# Patient Record
Sex: Female | Born: 1935 | Race: White | Hispanic: No | Smoking: Former smoker
Health system: Southern US, Community
[De-identification: ages and names within clinical notes are randomized; demographics above are authoritative.]

## PROBLEM LIST (undated history)

## (undated) DIAGNOSIS — N189 Chronic kidney disease, unspecified: Secondary | ICD-10-CM

## (undated) DIAGNOSIS — S72009A Fracture of unspecified part of neck of unspecified femur, initial encounter for closed fracture: Secondary | ICD-10-CM

## (undated) DIAGNOSIS — I10 Essential (primary) hypertension: Secondary | ICD-10-CM

## (undated) DIAGNOSIS — F419 Anxiety disorder, unspecified: Secondary | ICD-10-CM

## (undated) DIAGNOSIS — K5790 Diverticulosis of intestine, part unspecified, without perforation or abscess without bleeding: Secondary | ICD-10-CM

## (undated) DIAGNOSIS — I6529 Occlusion and stenosis of unspecified carotid artery: Secondary | ICD-10-CM

## (undated) DIAGNOSIS — I2510 Atherosclerotic heart disease of native coronary artery without angina pectoris: Secondary | ICD-10-CM

## (undated) DIAGNOSIS — M810 Age-related osteoporosis without current pathological fracture: Secondary | ICD-10-CM

## (undated) DIAGNOSIS — E039 Hypothyroidism, unspecified: Secondary | ICD-10-CM

## (undated) DIAGNOSIS — R001 Bradycardia, unspecified: Secondary | ICD-10-CM

## (undated) DIAGNOSIS — E785 Hyperlipidemia, unspecified: Secondary | ICD-10-CM

## (undated) DIAGNOSIS — K4090 Unilateral inguinal hernia, without obstruction or gangrene, not specified as recurrent: Secondary | ICD-10-CM

## (undated) DIAGNOSIS — G459 Transient cerebral ischemic attack, unspecified: Secondary | ICD-10-CM

## (undated) DIAGNOSIS — I5032 Chronic diastolic (congestive) heart failure: Secondary | ICD-10-CM

## (undated) DIAGNOSIS — M8580 Other specified disorders of bone density and structure, unspecified site: Secondary | ICD-10-CM

## (undated) DIAGNOSIS — I350 Nonrheumatic aortic (valve) stenosis: Secondary | ICD-10-CM

## (undated) DIAGNOSIS — R7309 Other abnormal glucose: Secondary | ICD-10-CM

## (undated) DIAGNOSIS — E559 Vitamin D deficiency, unspecified: Secondary | ICD-10-CM

## (undated) DIAGNOSIS — K2970 Gastritis, unspecified, without bleeding: Secondary | ICD-10-CM

## (undated) HISTORY — PX: HIP SURGERY: SHX245

## (undated) HISTORY — PX: EYE SURGERY: SHX253

## (undated) HISTORY — DX: Chronic diastolic (congestive) heart failure: I50.32

## (undated) HISTORY — DX: Other specified disorders of bone density and structure, unspecified site: M85.80

## (undated) HISTORY — DX: Hyperlipidemia, unspecified: E78.5

## (undated) HISTORY — DX: Diverticulosis of intestine, part unspecified, without perforation or abscess without bleeding: K57.90

## (undated) HISTORY — DX: Unilateral inguinal hernia, without obstruction or gangrene, not specified as recurrent: K40.90

## (undated) HISTORY — DX: Atherosclerotic heart disease of native coronary artery without angina pectoris: I25.10

## (undated) HISTORY — PX: INGUINAL HERNIA REPAIR: SUR1180

## (undated) HISTORY — PX: TONSILLECTOMY: SUR1361

## (undated) HISTORY — DX: Hypothyroidism, unspecified: E03.9

## (undated) HISTORY — DX: Bradycardia, unspecified: R00.1

## (undated) HISTORY — DX: Nonrheumatic aortic (valve) stenosis: I35.0

## (undated) HISTORY — DX: Other abnormal glucose: R73.09

## (undated) HISTORY — DX: Occlusion and stenosis of unspecified carotid artery: I65.29

## (undated) HISTORY — DX: Chronic kidney disease, unspecified: N18.9

## (undated) HISTORY — PX: SKIN LESION EXCISION: SHX2412

## (undated) HISTORY — DX: Vitamin D deficiency, unspecified: E55.9

## (undated) HISTORY — DX: Transient cerebral ischemic attack, unspecified: G45.9

---

## 1981-05-24 HISTORY — PX: BLEPHAROPLASTY: SUR158

## 1989-05-24 HISTORY — PX: MELANOMA EXCISION: SHX5266

## 1997-07-03 ENCOUNTER — Ambulatory Visit (HOSPITAL_COMMUNITY): Admission: RE | Admit: 1997-07-03 | Discharge: 1997-07-03 | Payer: Self-pay | Admitting: Internal Medicine

## 1997-12-04 ENCOUNTER — Other Ambulatory Visit: Admission: RE | Admit: 1997-12-04 | Discharge: 1997-12-04 | Payer: Self-pay | Admitting: Internal Medicine

## 1998-09-25 ENCOUNTER — Encounter: Payer: Self-pay | Admitting: Internal Medicine

## 1998-09-25 ENCOUNTER — Ambulatory Visit (HOSPITAL_COMMUNITY): Admission: RE | Admit: 1998-09-25 | Discharge: 1998-09-25 | Payer: Self-pay | Admitting: Internal Medicine

## 1998-10-30 ENCOUNTER — Other Ambulatory Visit: Admission: RE | Admit: 1998-10-30 | Discharge: 1998-10-30 | Payer: Self-pay | Admitting: Internal Medicine

## 1999-11-26 ENCOUNTER — Encounter: Payer: Self-pay | Admitting: Internal Medicine

## 1999-11-26 ENCOUNTER — Ambulatory Visit (HOSPITAL_COMMUNITY): Admission: RE | Admit: 1999-11-26 | Discharge: 1999-11-26 | Payer: Self-pay | Admitting: Internal Medicine

## 2000-12-07 ENCOUNTER — Encounter: Payer: Self-pay | Admitting: Internal Medicine

## 2000-12-07 ENCOUNTER — Ambulatory Visit (HOSPITAL_COMMUNITY): Admission: RE | Admit: 2000-12-07 | Discharge: 2000-12-07 | Payer: Self-pay | Admitting: Internal Medicine

## 2001-12-13 ENCOUNTER — Ambulatory Visit (HOSPITAL_COMMUNITY): Admission: RE | Admit: 2001-12-13 | Discharge: 2001-12-13 | Payer: Self-pay | Admitting: Internal Medicine

## 2001-12-13 ENCOUNTER — Encounter: Payer: Self-pay | Admitting: Internal Medicine

## 2002-02-15 ENCOUNTER — Encounter: Payer: Self-pay | Admitting: Emergency Medicine

## 2002-02-15 ENCOUNTER — Emergency Department (HOSPITAL_COMMUNITY): Admission: EM | Admit: 2002-02-15 | Discharge: 2002-02-15 | Payer: Self-pay | Admitting: Emergency Medicine

## 2002-07-04 ENCOUNTER — Other Ambulatory Visit: Admission: RE | Admit: 2002-07-04 | Discharge: 2002-07-04 | Payer: Self-pay | Admitting: Gynecology

## 2002-12-17 ENCOUNTER — Encounter: Payer: Self-pay | Admitting: Internal Medicine

## 2002-12-17 ENCOUNTER — Ambulatory Visit (HOSPITAL_COMMUNITY): Admission: RE | Admit: 2002-12-17 | Discharge: 2002-12-17 | Payer: Self-pay | Admitting: Internal Medicine

## 2004-01-02 ENCOUNTER — Ambulatory Visit (HOSPITAL_COMMUNITY): Admission: RE | Admit: 2004-01-02 | Discharge: 2004-01-02 | Payer: Self-pay | Admitting: Internal Medicine

## 2004-01-30 ENCOUNTER — Other Ambulatory Visit: Admission: RE | Admit: 2004-01-30 | Discharge: 2004-01-30 | Payer: Self-pay | Admitting: Internal Medicine

## 2004-08-06 ENCOUNTER — Ambulatory Visit (HOSPITAL_COMMUNITY): Admission: RE | Admit: 2004-08-06 | Discharge: 2004-08-06 | Payer: Self-pay | Admitting: General Surgery

## 2004-08-06 ENCOUNTER — Ambulatory Visit (HOSPITAL_BASED_OUTPATIENT_CLINIC_OR_DEPARTMENT_OTHER): Admission: RE | Admit: 2004-08-06 | Discharge: 2004-08-06 | Payer: Self-pay | Admitting: General Surgery

## 2004-08-06 ENCOUNTER — Encounter (INDEPENDENT_AMBULATORY_CARE_PROVIDER_SITE_OTHER): Payer: Self-pay | Admitting: *Deleted

## 2005-01-04 ENCOUNTER — Ambulatory Visit (HOSPITAL_COMMUNITY): Admission: RE | Admit: 2005-01-04 | Discharge: 2005-01-04 | Payer: Self-pay | Admitting: Internal Medicine

## 2006-01-18 ENCOUNTER — Ambulatory Visit (HOSPITAL_COMMUNITY): Admission: RE | Admit: 2006-01-18 | Discharge: 2006-01-18 | Payer: Self-pay | Admitting: Internal Medicine

## 2007-01-20 ENCOUNTER — Ambulatory Visit (HOSPITAL_COMMUNITY): Admission: RE | Admit: 2007-01-20 | Discharge: 2007-01-20 | Payer: Self-pay | Admitting: Internal Medicine

## 2007-03-28 ENCOUNTER — Encounter: Admission: RE | Admit: 2007-03-28 | Discharge: 2007-03-28 | Payer: Self-pay | Admitting: Internal Medicine

## 2007-04-17 ENCOUNTER — Ambulatory Visit: Payer: Self-pay | Admitting: Internal Medicine

## 2007-06-05 ENCOUNTER — Ambulatory Visit: Payer: Self-pay | Admitting: Internal Medicine

## 2007-06-15 DIAGNOSIS — K5730 Diverticulosis of large intestine without perforation or abscess without bleeding: Secondary | ICD-10-CM | POA: Insufficient documentation

## 2007-06-15 DIAGNOSIS — I2510 Atherosclerotic heart disease of native coronary artery without angina pectoris: Secondary | ICD-10-CM

## 2007-06-21 ENCOUNTER — Ambulatory Visit: Payer: Self-pay | Admitting: Internal Medicine

## 2008-01-23 ENCOUNTER — Ambulatory Visit (HOSPITAL_COMMUNITY): Admission: RE | Admit: 2008-01-23 | Discharge: 2008-01-23 | Payer: Self-pay | Admitting: Internal Medicine

## 2008-06-05 ENCOUNTER — Ambulatory Visit (HOSPITAL_COMMUNITY): Admission: RE | Admit: 2008-06-05 | Discharge: 2008-06-05 | Payer: Self-pay | Admitting: Internal Medicine

## 2009-01-23 ENCOUNTER — Ambulatory Visit (HOSPITAL_COMMUNITY): Admission: RE | Admit: 2009-01-23 | Discharge: 2009-01-23 | Payer: Self-pay | Admitting: Internal Medicine

## 2009-09-30 ENCOUNTER — Ambulatory Visit (HOSPITAL_COMMUNITY): Admission: RE | Admit: 2009-09-30 | Discharge: 2009-09-30 | Payer: Self-pay | Admitting: Internal Medicine

## 2009-10-31 ENCOUNTER — Encounter: Admission: RE | Admit: 2009-10-31 | Discharge: 2009-11-19 | Payer: Self-pay | Admitting: Specialist

## 2010-01-02 ENCOUNTER — Emergency Department (HOSPITAL_COMMUNITY): Admission: EM | Admit: 2010-01-02 | Discharge: 2010-01-03 | Payer: Self-pay | Admitting: Emergency Medicine

## 2010-01-06 ENCOUNTER — Ambulatory Visit: Payer: Self-pay | Admitting: Cardiology

## 2010-01-06 DIAGNOSIS — G459 Transient cerebral ischemic attack, unspecified: Secondary | ICD-10-CM | POA: Insufficient documentation

## 2010-01-06 DIAGNOSIS — I498 Other specified cardiac arrhythmias: Secondary | ICD-10-CM

## 2010-01-07 ENCOUNTER — Encounter: Payer: Self-pay | Admitting: Cardiology

## 2010-01-13 ENCOUNTER — Encounter: Admission: RE | Admit: 2010-01-13 | Discharge: 2010-01-13 | Payer: Self-pay | Admitting: Diagnostic Neuroimaging

## 2010-01-19 ENCOUNTER — Encounter: Payer: Self-pay | Admitting: Cardiology

## 2010-01-19 ENCOUNTER — Ambulatory Visit: Payer: Self-pay

## 2010-01-19 ENCOUNTER — Ambulatory Visit: Payer: Self-pay | Admitting: Cardiology

## 2010-01-19 ENCOUNTER — Ambulatory Visit (HOSPITAL_COMMUNITY): Admission: RE | Admit: 2010-01-19 | Discharge: 2010-01-19 | Payer: Self-pay | Admitting: Cardiology

## 2010-01-28 ENCOUNTER — Ambulatory Visit (HOSPITAL_COMMUNITY): Admission: RE | Admit: 2010-01-28 | Discharge: 2010-01-28 | Payer: Self-pay | Admitting: Internal Medicine

## 2010-02-02 ENCOUNTER — Telehealth: Payer: Self-pay | Admitting: Cardiology

## 2010-02-03 ENCOUNTER — Encounter: Admission: RE | Admit: 2010-02-03 | Discharge: 2010-02-03 | Payer: Self-pay | Admitting: Internal Medicine

## 2010-02-05 ENCOUNTER — Ambulatory Visit: Payer: Self-pay | Admitting: Cardiology

## 2010-02-10 LAB — CONVERTED CEMR LAB
Cholesterol: 164 mg/dL (ref 0–200)
HDL: 46.8 mg/dL (ref >39.00)
Total Bilirubin: 0.6 mg/dL (ref 0.3–1.2)
Total CHOL/HDL Ratio: 4
Total Protein: 6.4 g/dL (ref 6.0–8.3)
Triglycerides: 106 mg/dL (ref 0.0–149.0)
VLDL: 21.2 mg/dL (ref 0.0–40.0)

## 2010-02-25 ENCOUNTER — Ambulatory Visit (HOSPITAL_COMMUNITY): Admission: RE | Admit: 2010-02-25 | Discharge: 2010-02-25 | Payer: Self-pay | Admitting: Internal Medicine

## 2010-06-14 ENCOUNTER — Encounter: Payer: Self-pay | Admitting: Internal Medicine

## 2010-06-25 NOTE — Assessment & Plan Note (Signed)
Summary: PER CHECK OUT/SF  Medications Added LEVOTHROID 200 MCG TABS (LEVOTHYROXINE SODIUM) 1 tablet Mon, Wed and Friday and 1/2 pill all others ASPIRIN 81 MG TBEC (ASPIRIN) two daily      Allergies Added: NKDA  Primary Provider:  Dr. Oneta Rack  CC:  follow up to echo and monitor. Pt states she is doing fine.  History of Present Illness: 77 yo with history of HTN and possible TIAs referred from the ER for evaluation of bradycardia.  Patient has had two episodes thought to be consistent with TIAs.  The first occurred in 2010 and was an episode of double vision.  She saw her ophthalmologist for this and was told that it was probably a TIA.  Earlier this month, she had another episode of double vision that lasted for < 1 hour.  She went to the ER, where head CT was unremarkable.  She was noted to have a heart rate in the 50s.  She was thought to have had a recurrent TIA and was discharged home.  She was referred to cardiology because of the bradycardia.  She denies lightheadedness or syncope.  She has never had palpitations.  She denies chest pain or significant exertional dyspnea, though she is not very active.  She has no history of atrial fibrillation.  She has had no further neurological symptoms.    Holter monitor was done, showing no significant bradycardia.  There were frequent PACs and short runs of slow atrial tachycardia.  Echo showed EF 65%, mild LVH, aortic sclerosis without significant stenosis.  Carotid dopplers showed 40-50% RICA stenosis.  Patient was seen by neurology, and apparently was told to take ASA 162 mg daily rather than Aggrenox.      Current Medications (verified): 1)  Levothroid 200 Mcg Tabs (Levothyroxine Sodium) .Marland Kitchen.. 1 Tablet Mon, Wed and Friday and 1/2 Pill All Others 2)  Bisoprolol-Hydrochlorothiazide 5-6.25 Mg Tabs (Bisoprolol-Hydrochlorothiazide) .Marland Kitchen.. 1 Tab By Mouth Once Daily 3)  Bumetanide 1 Mg Tabs (Bumetanide) .Marland Kitchen.. 1 Tab By Mouth Once Daily 4)  Red Yeast Rice  600 Mg Tabs (Red Yeast Rice Extract) .... 3 Tabs By Mouth Once Daily 5)  Vitamin D 2000 Unit Tabs (Cholecalciferol) .... 2 Tabs By Mouth Once Daily 6)  Ra Zinc 50 Mg Tabs (Zinc Gluconate) .Marland Kitchen.. 1  Tab By Mouth Once Daily 7)  Anti Acid .... 2 Tab By Mouth Once Daily 8)  Biotin 1000 Mcg Tabs (Biotin) .Marland Kitchen.. 1 Tab By Mouth Once Daily 9)  Norvasc 5 Mg Tabs (Amlodipine Besylate) .... One Daily  Allergies (verified): No Known Drug Allergies  Past History:  Past Medical History: 1. DIVERTICULOSIS, COLON (ICD-562.10) 2. HYPOTHYROIDISM (ICD-244.9) 3. HYPERTENSION (ICD-401.9) 4. TIA: x 2, manifested by diplopia.  Carotid dopplers (8/11): 40-50% RICA stenosis.   5. Holter monitor (8/11): No significant bradycardia, frequent PACs, few short runs of slow atrial tachycardia.  6. Echo (8/11): EF 65%, mild LV hypertrophy, aortic sclerosis without significant stenosis, mild mitral regurgitation.   Family History: Reviewed history from 01/06/2010 and no changes required. No premature CAD.  Mother with CVA in her 30s.   Social History: Reviewed history from 01/06/2010 and no changes required. Lives alone.  Widowed.  No children but has a nephew living in town.  Nonsmoker.   Vital Signs:  Patient profile:   77 year old female Height:      68 inches Weight:      137 pounds BMI:     20.91 Pulse rate:   58 / minute  Pulse rhythm:   regular BP sitting:   140 / 58  (left arm) Cuff size:   regular  Vitals Entered By: Judithe Modest CMA (February 05, 2010 12:22 PM)  Physical Exam  General:  Well developed, well nourished, in no acute distress. Neck:  Neck supple, no JVD. No masses, thyromegaly or abnormal cervical nodes. Lungs:  Clear bilaterally to auscultation and percussion. Heart:  Non-displaced PMI, chest non-tender; regular rate and rhythm, S1, S2.  2/6 SEM. +S4. Carotid upstroke normal, soft bilateral carotid bruits (versus radiated murmur from the heart).  Pedals normal pulses. No edema, no  varicosities. Abdomen:  Bowel sounds positive; abdomen soft and non-tender without masses, organomegaly, or hernias noted. No hepatosplenomegaly. Extremities:  No clubbing or cyanosis. Neurologic:  Alert and oriented x 3. Psych:  Normal affect.   Impression & Recommendations:  Problem # 1:  TRANSIENT ISCHEMIC ATTACK (ICD-435.9) Possible TIA while taking aspirin 81 mg daily.  Carotid doppler showed mild to moderate stenosis.  Echocardiogram showed no source of embolus.  Patient has been told, apparently by neurology, to increase aspirin to 162 mg daily (rather than using Aggrenox or Plavix).  I will check lipids.  Given known vascular disease (mild to moderate carotid stenosis), would aim for LDL < 70.   Problem # 2:  HYPERTENSION (ICD-401.9) BP is improved on amlodipine.  Continue this medication.    Other Orders: TLB-Lipid Panel (80061-LIPID) TLB-Hepatic/Liver Function Pnl (80076-HEPATIC)  Patient Instructions: 1)  Your physician recommends that you return for a FASTING lipid profile/livee profile today--401.9  2)  Your physician wants you to follow-up in: 6 months with Dr Shirlee Latch.  You will receive a reminder letter in the mail two months in advance. If you don't receive a letter, please call our office to schedule the follow-up appointment.

## 2010-06-25 NOTE — Procedures (Signed)
Summary: summary report  summary report   Imported By: Mirna Mires 02/05/2010 15:01:36  _____________________________________________________________________  External Attachment:    Type:   Image     Comment:   External Document

## 2010-06-25 NOTE — Progress Notes (Signed)
Summary: monitor results   Phone Note Outgoing Call   Call placed by: Katina Dung, RN, BSN,  February 02, 2010 4:32 PM Call placed to: Patient Summary of Call: monitor results  Follow-up for Phone Call        Dr Shirlee Latch reviewed monitor done 01/19/10--Dr McLean's summary-no significant bradycardia-frequent PACs/short runs of relatively slow atrial tachycardia--continue Bisoprolol--I gave pt results by telephone--she has an appt with Dr Shirlee Latch 02/05/10

## 2010-06-25 NOTE — Assessment & Plan Note (Signed)
Summary: nph  Medications Added LEVOTHROID 200 MCG TABS (LEVOTHYROXINE SODIUM) 1/2 tab by mouth once daily BISOPROLOL-HYDROCHLOROTHIAZIDE 5-6.25 MG TABS (BISOPROLOL-HYDROCHLOROTHIAZIDE) 1 tab by mouth once daily BUMETANIDE 1 MG TABS (BUMETANIDE) 1 tab by mouth once daily CIPRO 500 MG TABS (CIPROFLOXACIN HCL) 2 tabs by mouth once daily (for 7days) RED YEAST RICE 600 MG TABS (RED YEAST RICE EXTRACT) 3 tabs by mouth once daily VITAMIN D 2000 UNIT TABS (CHOLECALCIFEROL) 2 tabs by mouth once daily RA ZINC 50 MG TABS (ZINC GLUCONATE) 1  tab by mouth once daily * ANTI ACID 2 tab by mouth once daily BIOTIN 1000 MCG TABS (BIOTIN) 1 tab by mouth once daily NORVASC 5 MG TABS (AMLODIPINE BESYLATE) one daily AGGRENOX 25-200 MG XR12H-CAP (ASPIRIN-DIPYRIDAMOLE) one twice a day        Primary Provider:  Dr. Oneta Rack  CC:  check up.  History of Present Illness: 77 yo with history of HTN and possible TIAs referred from the ER for evaluation of bradycardia.  Patient has had two episodes thought to be consistent with TIAs.  The first occurred in 2010 and was an episode of double vision.  She saw her ophthalmologist for this and was told that it was probably a TIA.  Earlier this month, she had another episode of double vision that lasted for < 1 hour.  She went to the ER, where head CT was unremarkable.  She was noted to have a heart rate in the 50s.  She was thought to have had a recurrent TIA and was discharged home.  She was referred to cardiology because of the bradycardia.  She denies lightheadedness or syncope.  She has never had palpitations.  BP is 159/61 today. She says that it usually runs in the 150s systolic.  She denies chest pain or significant exertional dyspnea, though she is not very active.  She has no history of atrial fibrillation.  She is going to see neurology tomorrow.   ECG: NSR at 56, possible left atrial enlargement  Current Medications (verified): 1)  Levothroid 200 Mcg Tabs  (Levothyroxine Sodium) .... 1/2 Tab By Mouth Once Daily 2)  Bisoprolol-Hydrochlorothiazide 5-6.25 Mg Tabs (Bisoprolol-Hydrochlorothiazide) .Marland Kitchen.. 1 Tab By Mouth Once Daily 3)  Bumetanide 1 Mg Tabs (Bumetanide) .Marland Kitchen.. 1 Tab By Mouth Once Daily 4)  Cipro 500 Mg Tabs (Ciprofloxacin Hcl) .... 2 Tabs By Mouth Once Daily (For 7days) 5)  Aspirin 81 Mg Tbec (Aspirin) .... 2 Tabs By Mouth Once Daily 6)  Red Yeast Rice 600 Mg Tabs (Red Yeast Rice Extract) .... 3 Tabs By Mouth Once Daily 7)  Vitamin D 2000 Unit Tabs (Cholecalciferol) .... 2 Tabs By Mouth Once Daily 8)  Ra Zinc 50 Mg Tabs (Zinc Gluconate) .Marland Kitchen.. 1  Tab By Mouth Once Daily 9)  Anti Acid .... 2 Tab By Mouth Once Daily 10)  Biotin 1000 Mcg Tabs (Biotin) .Marland Kitchen.. 1 Tab By Mouth Once Daily  Past History:  Past Medical History: 1. DIVERTICULOSIS, COLON (ICD-562.10) 2. HYPOTHYROIDISM (ICD-244.9) 3. HYPERTENSION (ICD-401.9) 4. TIA: x 2, manifested by diplopia.   Family History: No premature CAD.  Mother with CVA in her 30s.   Social History: Lives alone.  Widowed.  No children but has a nephew living in town.  Nonsmoker.   Review of Systems       All systems reviewed and negative except as per HPI.    Vital Signs:  Patient profile:   77 year old female Height:      68 inches Weight:  136 pounds BMI:     20.75 Pulse rate:   56 / minute Resp:     14 per minute BP sitting:   159 / 61  (right arm)  Vitals Entered By: Kem Parkinson (January 06, 2010 1:00 PM)  Physical Exam  General:  Well developed, well nourished, in no acute distress. Head:  normocephalic and atraumatic Nose:  no deformity, discharge, inflammation, or lesions Mouth:  Teeth, gums and palate normal. Oral mucosa normal. Neck:  Neck supple, no JVD. No masses, thyromegaly or abnormal cervical nodes. Lungs:  Clear bilaterally to auscultation and percussion. Heart:  Non-displaced PMI, chest non-tender; regular rate and rhythm, S1, S2.  1/6 SEM. +S4. Carotid upstroke  normal, soft bilateral carotid bruits.  Pedals normal pulses. No edema, no varicosities. Abdomen:  Bowel sounds positive; abdomen soft and non-tender without masses, organomegaly, or hernias noted. No hepatosplenomegaly. Extremities:  No clubbing or cyanosis. Neurologic:  Alert and oriented x 3. Skin:  Intact without lesions or rashes. Psych:  Normal affect.   Impression & Recommendations:  Problem # 1:  BRADYCARDIA (ICD-427.89) Patient's heart rate is in the 50s today and was in the 50s in the ER.  I have not seen evidence for significant bradycardia.  She has not had lightheadedness or syncope.  I will get a 48 hour holter monitor to further assess.    Problem # 2:  TRANSIENT ISCHEMIC ATTACK (ICD-435.9) Possible TIA while taking aspirin 81 mg daily.  I will set her up for carotid dopplers to look for significant carotid disease.  She will get an echocardiogram.  I will give her a prescription for Aggrenox 1 tab two times a day.  She will check with the neurologist tomorrow before filling this prescription to see if they think this is reasonable.    Problem # 3:  HYPERTENSION (ICD-401.9) BP running consistently high.  Add amlodipine 5 mg daily.    We will get last lipids/LFTs from Dr. Oneta Rack.   Other Orders: Holter Monitor (Holter Monitor) Echocardiogram (Echo) Carotid Duplex (Carotid Duplex)  Patient Instructions: 1)  Your physician has recommended you make the following change in your medication:  2)  Start Amlodipine(Norvasc) 5mg  daily. 3)  Stop Aspirin when you start Aggrenox. You should not take Aspirin and Aggrenox. Aggrenox contains Aspirin. 4)  Start Aggrenox 25/200mg   one twice a day.--Do not start this until you see the neurologist tomorrow and it is OK with the neurologist.Anne Lankford,RN [phone]  5)  Your physician has requested that you have a carotid duplex. This test is an ultrasound of the carotid arteries in your neck. It looks at blood flow through these  arteries that supply the brain with blood. Allow one hour for this exam. There are no restrictions or special instructions. 6)  Your physician has requested that you have an echocardiogram.  Echocardiography is a painless test that uses sound waves to create images of your heart. It provides your doctor with information about the size and shape of your heart and how well your heart's chambers and valves are working.  This procedure takes approximately one hour. There are no restrictions for this procedure. 7)  Your physician has recommended that you wear a holter monitor.  Holter monitors are medical devices that record the heart's electrical activity. Doctors most often use these monitors to diagnose arrhythmias. Arrhythmias are problems with the speed or rhythm of the heartbeat. The monitor is a small, portable device. You can wear one while you do your normal daily  activities. This is usually used to diagnose what is causing palpitations/syncope (passing out). 48 hour 8)  Your physician recommends that you schedule a follow-up appointment in: 1 month with Dr Shirlee Latch. Prescriptions: AGGRENOX 25-200 MG XR12H-CAP (ASPIRIN-DIPYRIDAMOLE) one twice a day  #60 x 6   Entered by:   Katina Dung, RN, BSN   Authorized by:   Marca Ancona, MD   Signed by:   Katina Dung, RN, BSN on 01/06/2010   Method used:   Electronically to        [facility]* (retail)       [address]       [place], [place]  [address]       Ph: [phone]       Fax: [phone]   RxID:   [id number] NORVASC 5 MG TABS (AMLODIPINE BESYLATE) one daily  #30 x 6   Entered by:   Katina Dung, RN, BSN   Authorized by:   Marca Ancona, MD   Signed by:   Katina Dung, RN, BSN on 01/06/2010   Method used:   Electronically to        [facility]* (retail)       [address]       [place], [place]  [address]       Ph: [phone]       Fax: [phone]    RxID:   [id number]

## 2010-06-26 NOTE — Letter (Signed)
Summary: [facility]  [facility]   Imported By: Marylou Mccoy 01/08/2010 08:59:24  _____________________________________________________________________  External Attachment:    Type:   Image     Comment:   External Document

## 2010-08-07 LAB — CK TOTAL AND CKMB (NOT AT ARMC)
CK, MB: 1.1 ng/mL (ref 0.3–4.0)
Total CK: 40 U/L (ref 7–177)

## 2010-08-07 LAB — URINALYSIS, ROUTINE W REFLEX MICROSCOPIC
Bilirubin Urine: NEGATIVE
Glucose, UA: NEGATIVE mg/dL
Protein, ur: NEGATIVE mg/dL
Specific Gravity, Urine: 1.013 (ref 1.005–1.030)
Urobilinogen, UA: 0.2 mg/dL (ref 0.0–1.0)
pH: 6.5 (ref 5.0–8.0)

## 2010-08-07 LAB — POCT I-STAT, CHEM 8
Calcium, Ion: 1.15 mmol/L (ref 1.12–1.32)
Chloride: 101 mEq/L (ref 96–112)
Potassium: 4.1 mEq/L (ref 3.5–5.1)

## 2010-08-07 LAB — DIFFERENTIAL
Basophils Relative: 1 % (ref 0–1)
Lymphocytes Relative: 28 % (ref 12–46)
Monocytes Absolute: 0.5 10*3/uL (ref 0.1–1.0)
Monocytes Relative: 6 % (ref 3–12)
Neutro Abs: 5.1 10*3/uL (ref 1.7–7.7)

## 2010-08-07 LAB — GLUCOSE, CAPILLARY: Glucose-Capillary: 85 mg/dL (ref 70–99)

## 2010-08-07 LAB — URINE MICROSCOPIC-ADD ON

## 2010-08-07 LAB — CBC
Hemoglobin: 14.2 g/dL (ref 12.0–15.0)
MCV: 85.6 fL (ref 78.0–100.0)
WBC: 8.1 10*3/uL (ref 4.0–10.5)

## 2010-08-07 LAB — URINE CULTURE

## 2010-08-07 LAB — TROPONIN I: Troponin I: 0.01 ng/mL (ref 0.00–0.06)

## 2010-10-06 NOTE — Assessment & Plan Note (Signed)
[facility]                         GASTROENTEROLOGY OFFICE NOTE   Rachael, [name]                    MRN:          [id number]  DATE:04/17/2007                            DOB:          07-Sep-1923    REASON FOR CONSULTATION:  Weight loss.  Request colonoscopy.   HISTORY:  This is an 77 year old white female with a history of  hypertension and hypothyroidism.  She is referred through the courtesy  of Dr. Oneta Rack regarding weight loss and colonoscopy.  The patient  reports to me an 8 or 10-pound weight loss over the past year and a  half.  There have been no associated GI symptoms.  In particular, she  describes good appetite.  There is no nausea, heartburn, dysphagia,  abdominal pain, change in bowel habits, melena or hematochezia.  She did  undergo a CT scan of the abdomen and pelvis, March 28, 2007.  No focal  lesions were noted.  The patient does have atherosclerotic disease, also  diverticulosis.  The patient had discussed with Dr. Oneta Rack that she had  never had a colonoscopy and was interested in proceeding based on her  reading.  As well, to rule out colon cancer as a cause for weight loss.  She tells me that she has had laboratories drawn which have been  unremarkable.  We await these for review.   PAST MEDICAL HISTORY:  1. Hypertension.  2. Hypothyroidism.   PAST SURGICAL HISTORY:  1. Tonsillectomy.  2. Left inguinal hernia repair.  3. Removal of benign skin lesion from the shoulder.   ALLERGIES:  No known drug allergies.   CURRENT MEDICATIONS:  1. Synthroid uncertain dosage daily.  2. Bumetanide unspecified dosage daily.   FAMILY HISTORY:  No family history of gastrointestinal malignancy.   SOCIAL HISTORY:  The patient is widowed for about 4 years.  She has no  children and lives alone.  She has a high school education and has been  employed previously in Audiological [employer].  She does not smoke and occasionally  uses alcohol.   REVIEW OF SYSTEMS:  Per diagnostic evaluation form.   PHYSICAL EXAMINATION:  A well-appearing female in no acute distress.  She looks younger than her stated age.  Blood pressure is 150/78, heart rate is 80 and regular, weight is 132  pounds.  She is 5 feet 8-1/2 inches in height.  HEENT:  Sclerae are anicteric.  Conjunctivae are pink.  Oral mucosa is  intact.  There is no adenopathy.  Her lungs are clear to auscultation and percussion.  The heart is regular with a 2/6 systolic murmur heard best at the right  upper sternal border.  Her abdomen is soft, nontender, nondistended, with good bowel sounds, no  organomegaly, mass or hernia.  RECTAL EXAM:  Deferred.  EXTREMITIES:  Without edema.   IMPRESSION:  This is an 77 year old female who presents with a  nonspecific weight loss over the past several years.  Absolutely no  gastrointestinal symptoms to suggest a primary gastrointestinal  etiology.  Essentially negative CT scan as described.  I certainly would  want to confirm that her thyroid function  is normal.  In terms of  colonoscopy, I discussed with her in detail the nature of the procedure,  as well as the pros and cons of proceeding with exam to further  investigate weight loss (with a low yeild to uncover a cause) as well as  provide baseline neoplasia screening.  This in the context of her age.  After full discussion it was clearly the patient's interest to proceed  with the colonoscopy.  Again, the nature of the procedure, as well as  the risks, benefits, and alternatives were reviewed.  She understood and  wished to proceed.     Wilhemina Bonito. Marina Goodell, MD  Electronically Signed    JNP/MedQ  DD: 04/17/2007  DT: 04/17/2007  Job #: [id number]   cc:   Lucky Cowboy, M.D.

## 2010-10-09 NOTE — Op Note (Signed)
NAME:  Rachael [name], Rachael [name]             ACCOUNT NO.:  [id number]   MEDICAL RECORD NO.:  [id number]          PATIENT TYPE:  AMB   LOCATION:  [facility]                          FACILITY:  [facility]   PHYSICIAN:  Ollen Gross. Vernell Morgans, M.D. DATE OF BIRTH:  [birth year]   DATE OF PROCEDURE:  08/06/2004  DATE OF DISCHARGE:                                 OPERATIVE REPORT   REFERRING PHYSICIAN:  Lucky Cowboy, M.D.   PREOPERATIVE DIAGNOSIS:  Sebaceous cyst on the shoulder.   POSTOPERATIVE DIAGNOSIS:  Sebaceous cyst on the shoulder.   PROCEDURE:  Excision of sebaceous from the right shoulder.   SURGEON:  Ollen Gross. Carolynne Edouard, M.D.   ANESTHESIA:  Local.   PROCEDURE:  After informed consent was obtained, the patient was brought to  the operating room and placed in supine position on the operating room  table.  The area on the right shoulder was then prepped with Betadine and  draped in usual sterile manner. The area was infiltrated and 1% lidocaine  with epinephrine until a good field block was created. The sebaceous cyst  was then excised sharply in an elliptical fashion with a 15 blade knife. The  excision was full thickness down to the fat and the cyst was completely  excised. The wound was then closed with interrupted 4-0 Monocryl  subcuticular stitches and Dermabond dressing was applied. The patient  tolerated well.  At the end of the case, all needle, sponge and instrument  counts were correct. The patient was then taken recovery in stable  condition.      PST/MEDQ  D:  08/06/2004  T:  08/06/2004  Job:  [id number]   cc:   Lucky Cowboy, M.D.  [address]  [place], [place] [address]  Fax: [phone]

## 2011-01-21 ENCOUNTER — Other Ambulatory Visit (HOSPITAL_COMMUNITY): Payer: Self-pay | Admitting: Internal Medicine

## 2011-01-21 DIAGNOSIS — Z1231 Encounter for screening mammogram for malignant neoplasm of breast: Secondary | ICD-10-CM

## 2011-02-04 ENCOUNTER — Ambulatory Visit (HOSPITAL_COMMUNITY)
Admission: RE | Admit: 2011-02-04 | Discharge: 2011-02-04 | Disposition: A | Discharge status: Home / Self Care | Payer: Medicare Other | Source: Ambulatory Visit | Attending: Internal Medicine | Admitting: Internal Medicine

## 2011-02-04 DIAGNOSIS — Z1231 Encounter for screening mammogram for malignant neoplasm of breast: Secondary | ICD-10-CM | POA: Insufficient documentation

## 2011-05-25 HISTORY — PX: TOTAL HIP ARTHROPLASTY: SHX124

## 2012-01-21 ENCOUNTER — Other Ambulatory Visit (HOSPITAL_COMMUNITY): Payer: Self-pay | Admitting: Internal Medicine

## 2012-01-21 DIAGNOSIS — Z1231 Encounter for screening mammogram for malignant neoplasm of breast: Secondary | ICD-10-CM

## 2012-02-07 ENCOUNTER — Ambulatory Visit (HOSPITAL_COMMUNITY)
Admission: RE | Admit: 2012-02-07 | Discharge: 2012-02-07 | Disposition: A | Discharge status: Home / Self Care | Payer: Medicare Other | Source: Ambulatory Visit | Attending: Internal Medicine | Admitting: Internal Medicine

## 2012-02-07 DIAGNOSIS — Z1231 Encounter for screening mammogram for malignant neoplasm of breast: Secondary | ICD-10-CM | POA: Insufficient documentation

## 2012-03-31 ENCOUNTER — Emergency Department (HOSPITAL_COMMUNITY): Payer: Medicare Other

## 2012-03-31 ENCOUNTER — Encounter (HOSPITAL_COMMUNITY): Payer: Self-pay | Admitting: Emergency Medicine

## 2012-03-31 ENCOUNTER — Inpatient Hospital Stay (HOSPITAL_COMMUNITY)
Admission: EM | Admit: 2012-03-31 | Discharge: 2012-04-04 | DRG: 470 | Disposition: A | Discharge status: Skilled Nursing Facility | Payer: Medicare Other | Attending: Emergency Medicine | Admitting: Emergency Medicine

## 2012-03-31 DIAGNOSIS — I498 Other specified cardiac arrhythmias: Secondary | ICD-10-CM

## 2012-03-31 DIAGNOSIS — W010XXA Fall on same level from slipping, tripping and stumbling without subsequent striking against object, initial encounter: Secondary | ICD-10-CM

## 2012-03-31 DIAGNOSIS — S1093XA Contusion of unspecified part of neck, initial encounter: Secondary | ICD-10-CM | POA: Diagnosis present

## 2012-03-31 DIAGNOSIS — K5730 Diverticulosis of large intestine without perforation or abscess without bleeding: Secondary | ICD-10-CM

## 2012-03-31 DIAGNOSIS — S72002A Fracture of unspecified part of neck of left femur, initial encounter for closed fracture: Secondary | ICD-10-CM

## 2012-03-31 DIAGNOSIS — E871 Hypo-osmolality and hyponatremia: Secondary | ICD-10-CM

## 2012-03-31 DIAGNOSIS — S0083XA Contusion of other part of head, initial encounter: Secondary | ICD-10-CM | POA: Diagnosis present

## 2012-03-31 DIAGNOSIS — S40022A Contusion of left upper arm, initial encounter: Secondary | ICD-10-CM

## 2012-03-31 DIAGNOSIS — I10 Essential (primary) hypertension: Secondary | ICD-10-CM

## 2012-03-31 DIAGNOSIS — S0003XA Contusion of scalp, initial encounter: Secondary | ICD-10-CM | POA: Diagnosis present

## 2012-03-31 DIAGNOSIS — G459 Transient cerebral ischemic attack, unspecified: Secondary | ICD-10-CM

## 2012-03-31 DIAGNOSIS — E039 Hypothyroidism, unspecified: Secondary | ICD-10-CM

## 2012-03-31 DIAGNOSIS — S72009A Fracture of unspecified part of neck of unspecified femur, initial encounter for closed fracture: Principal | ICD-10-CM | POA: Diagnosis present

## 2012-03-31 DIAGNOSIS — I2510 Atherosclerotic heart disease of native coronary artery without angina pectoris: Secondary | ICD-10-CM

## 2012-03-31 DIAGNOSIS — S40029A Contusion of unspecified upper arm, initial encounter: Secondary | ICD-10-CM

## 2012-03-31 DIAGNOSIS — Y92009 Unspecified place in unspecified non-institutional (private) residence as the place of occurrence of the external cause: Secondary | ICD-10-CM

## 2012-03-31 DIAGNOSIS — M25539 Pain in unspecified wrist: Secondary | ICD-10-CM | POA: Diagnosis present

## 2012-03-31 DIAGNOSIS — N179 Acute kidney failure, unspecified: Secondary | ICD-10-CM

## 2012-03-31 DIAGNOSIS — S0033XA Contusion of nose, initial encounter: Secondary | ICD-10-CM

## 2012-03-31 HISTORY — DX: Age-related osteoporosis without current pathological fracture: M81.0

## 2012-03-31 HISTORY — DX: Essential (primary) hypertension: I10

## 2012-03-31 LAB — COMPREHENSIVE METABOLIC PANEL
ALT: 30 U/L (ref 0–35)
AST: 34 U/L (ref 0–37)
Albumin: 3.9 g/dL (ref 3.5–5.2)
Alkaline Phosphatase: 73 U/L (ref 39–117)
BUN: 36 mg/dL — ABNORMAL HIGH (ref 6–23)
CO2: 23 mEq/L (ref 19–32)
Calcium: 8.7 mg/dL (ref 8.4–10.5)
Chloride: 101 mEq/L (ref 96–112)
Creatinine, Ser: 1.73 mg/dL — ABNORMAL HIGH (ref 0.50–1.10)
GFR calc Af Amer: 30 mL/min — ABNORMAL LOW (ref >90)
GFR calc non Af Amer: 26 mL/min — ABNORMAL LOW (ref >90)
Glucose, Bld: 105 mg/dL — ABNORMAL HIGH (ref 70–99)
Potassium: 3.7 mEq/L (ref 3.5–5.1)
Sodium: 137 mEq/L (ref 135–145)
Total Bilirubin: 0.6 mg/dL (ref 0.3–1.2)
Total Protein: 6.6 g/dL (ref 6.0–8.3)

## 2012-03-31 LAB — URINALYSIS, MICROSCOPIC ONLY
Bilirubin Urine: NEGATIVE
Glucose, UA: NEGATIVE mg/dL
Hgb urine dipstick: NEGATIVE
Ketones, ur: NEGATIVE mg/dL
Leukocytes, UA: NEGATIVE
Nitrite: NEGATIVE
Protein, ur: NEGATIVE mg/dL
Specific Gravity, Urine: 1.013 (ref 1.005–1.030)
Urobilinogen, UA: 0.2 mg/dL (ref 0.0–1.0)
pH: 5.5 (ref 5.0–8.0)

## 2012-03-31 LAB — CBC
HCT: 34.5 % — ABNORMAL LOW (ref 36.0–46.0)
Hemoglobin: 11.4 g/dL — ABNORMAL LOW (ref 12.0–15.0)
MCH: 29 pg (ref 26.0–34.0)
MCHC: 33 g/dL (ref 30.0–36.0)
MCV: 87.8 fL (ref 78.0–100.0)
Platelets: 228 10*3/uL (ref 150–400)
RBC: 3.93 MIL/uL (ref 3.87–5.11)
RDW: 12.6 % (ref 11.5–15.5)
WBC: 14 10*3/uL — ABNORMAL HIGH (ref 4.0–10.5)

## 2012-03-31 LAB — TYPE AND SCREEN
ABO/RH(D): O POS
Antibody Screen: NEGATIVE

## 2012-03-31 LAB — APTT: aPTT: 29 seconds (ref 24–37)

## 2012-03-31 LAB — PROTIME-INR
INR: 1.09 (ref 0.00–1.49)
Prothrombin Time: 14 seconds (ref 11.6–15.2)

## 2012-03-31 MED ORDER — ACETAMINOPHEN 650 MG RE SUPP
650.0000 mg | Freq: Four times a day (QID) | RECTAL | Status: DC | PRN
Start: 2012-03-31 — End: 2012-04-01

## 2012-03-31 MED ORDER — HYDROMORPHONE HCL PF 1 MG/ML IJ SOLN
0.5000 mg | INTRAMUSCULAR | Status: DC | PRN
  Administered 2012-04-01 (×3): 0.5 mg via INTRAVENOUS
  Filled 2012-03-31 (×3): qty 1

## 2012-03-31 MED ORDER — ALUM & MAG HYDROXIDE-SIMETH 200-200-20 MG/5ML PO SUSP
30.0000 mL | Freq: Four times a day (QID) | ORAL | Status: DC | PRN
  Filled 2012-03-31: qty 30

## 2012-03-31 MED ORDER — ONDANSETRON HCL 4 MG/2ML IJ SOLN: 4.0000 mg | Freq: Four times a day (QID) | INTRAMUSCULAR | Status: DC | PRN

## 2012-03-31 MED ORDER — FENTANYL CITRATE 0.05 MG/ML IJ SOLN
50.0000 ug | Freq: Once | INTRAMUSCULAR | Status: AC
  Administered 2012-03-31: 50 ug via INTRAVENOUS
  Filled 2012-03-31: qty 2

## 2012-03-31 MED ORDER — ONDANSETRON HCL 4 MG/2ML IJ SOLN
4.0000 mg | Freq: Once | INTRAMUSCULAR | Status: AC
  Administered 2012-03-31: 4 mg via INTRAVENOUS
  Filled 2012-03-31: qty 2

## 2012-03-31 MED ORDER — HYDROMORPHONE HCL PF 1 MG/ML IJ SOLN
0.5000 mg | Freq: Once | INTRAMUSCULAR | Status: AC
  Administered 2012-03-31: 0.5 mg via INTRAVENOUS
  Filled 2012-03-31: qty 1

## 2012-03-31 MED ORDER — OXYCODONE HCL 5 MG PO TABS
5.0000 mg | ORAL_TABLET | ORAL | Status: DC | PRN
  Administered 2012-04-01 – 2012-04-02 (×3): 5 mg via ORAL
  Filled 2012-03-31 (×3): qty 1

## 2012-03-31 MED ORDER — ONDANSETRON HCL 4 MG PO TABS: 4.0000 mg | ORAL_TABLET | Freq: Four times a day (QID) | ORAL | Status: DC | PRN

## 2012-03-31 MED ORDER — SODIUM CHLORIDE 0.9 % IV SOLN
INTRAVENOUS | Status: DC
  Administered 2012-04-01: 01:00:00 via INTRAVENOUS

## 2012-03-31 MED ORDER — ZOLPIDEM TARTRATE 5 MG PO TABS: 5.0000 mg | ORAL_TABLET | Freq: Every evening | ORAL | Status: DC | PRN

## 2012-03-31 MED ORDER — SODIUM CHLORIDE 0.9 % IV BOLUS (SEPSIS)
500.0000 mL | Freq: Once | INTRAVENOUS | Status: AC
  Administered 2012-03-31: 500 mL via INTRAVENOUS

## 2012-03-31 MED ORDER — ACETAMINOPHEN 325 MG PO TABS: 650.0000 mg | ORAL_TABLET | Freq: Four times a day (QID) | ORAL | Status: DC | PRN

## 2012-03-31 NOTE — ED Notes (Signed)
Kohut, MD at bedside. 

## 2012-03-31 NOTE — ED Notes (Signed)
Visitor remains at bedside.  

## 2012-03-31 NOTE — ED Notes (Signed)
Pt reports tripping in a "man hole" yesterday causing injury to left wrist and tripping today in garage over shoe laces. Pt complaining of left upper thigh pain. Pt denies any LOC.

## 2012-03-31 NOTE — ED Notes (Signed)
Patient is resting comfortably. 

## 2012-03-31 NOTE — ED Notes (Signed)
Visitor at bedside.

## 2012-03-31 NOTE — ED Notes (Signed)
OZH:[id number]<HQ> Expected date:03/31/12<BR> Expected time: 4:35 PM<BR> Means of arrival:Ambulance<BR> Comments:<BR> Elderly/fall/L hip pain

## 2012-03-31 NOTE — ED Provider Notes (Signed)
History    88 rolled female presenting primarily with left hip pain after fall today. Patient was in her garage when she tripped over her shoe laces. She fell onto her left side. She onset of severe pain in her left hip and groin region. Incidentally patient also had a mechanical fall yesterday when she stepped on an uneven surface while on her daily walk. She did strike her head during this fall. No loss of consciousness. No significant headache. Denies neck back pain. She's complaining of some pain in her left hand and left elbow. No numbness or tingling. Denies use of blood thinning medication aside from 81 mg of aspirin daily.   CSN: [id number]  Arrival date & time 03/31/12  1701   First MD Initiated Contact with Patient 03/31/12 2101      Chief Complaint  Patient presents with  . Fall  . Hip Injury    (Consider location/radiation/quality/duration/timing/severity/associated sxs/prior treatment) HPI  Past Medical History  Diagnosis Date  . Thyroid disease   . Hypertension     History reviewed. No pertinent past surgical history.  No family history on file.  History  Substance Use Topics  . Smoking status: Former Games developer  . Smokeless tobacco: Not on file  . Alcohol Use: 14.4 oz/week    24 Cans of beer per week    OB History    Grav Para Term Preterm Abortions TAB SAB Ect Mult Living                  Review of Systems   Review of symptoms negative unless otherwise noted in HPI.   Allergies  Review of patient's allergies indicates no known allergies.  Home Medications   Current Outpatient Rx  Name  Route  Sig  Dispense  Refill  . ALENDRONATE SODIUM 70 MG PO TABS   Oral   Take 70 mg by mouth every 7 (seven) days. Take with a full glass of water on an empty stomach.         . ASPIRIN 81 MG PO CHEW   Oral   Chew 81 mg by mouth daily.         Marland Kitchen BIOTIN 1000 MCG PO TABS   Oral   Take 1,000 mcg by mouth daily.         Marland Kitchen BISOPROLOL-HYDROCHLOROTHIAZIDE  10-6.25 MG PO TABS   Oral   Take 1 tablet by mouth daily.         . BUMETANIDE 1 MG PO TABS   Oral   Take 1 mg by mouth daily. Take 1/2 tablet every other day on Monday, Wednesday, Friday and Sunday.         Marland Kitchen CALCIUM PO   Oral   Take 2 capsules by mouth daily.         Marland Kitchen LEVOTHYROXINE SODIUM 125 MCG PO TABS   Oral   Take 125 mcg by mouth daily.         . ADULT MULTIVITAMIN W/MINERALS CH   Oral   Take 1 tablet by mouth daily.         Marland Kitchen BENICAR PO   Oral   Take by mouth daily. 1/4 tablet daily.         . RED YEAST RICE PO   Oral   Take 1 capsule by mouth daily.         Marland Kitchen SOLIFENACIN SUCCINATE 10 MG PO TABS   Oral   Take 2.5 mg by mouth as needed. Bladder  issues.         Marland Kitchen ZINC GLUCONATE 50 MG PO TABS   Oral   Take 50 mg by mouth daily.           BP 160/58  Pulse 64  Temp 98.3 F (36.8 C) (Oral)  Resp 22  SpO2 90%  Physical Exam  Nursing note and vitals reviewed. Constitutional: She is oriented to person, place, and time. She appears well-developed and well-nourished. No distress.  HENT:  Head: Normocephalic and atraumatic.       Superficial abrasions across the bridge of the nose. No significant bony tenderness. No septal hematoma.  Eyes: Conjunctivae normal are normal. Pupils are equal, round, and reactive to light. Right eye exhibits no discharge. Left eye exhibits no discharge.  Neck: Neck supple.  Cardiovascular: Normal rate, regular rhythm and normal heart sounds.  Exam reveals no gallop and no friction rub.   No murmur heard. Pulmonary/Chest: Effort normal and breath sounds normal. No respiratory distress. She exhibits no tenderness.  Abdominal: Soft. She exhibits no distension. There is no tenderness.  Musculoskeletal: She exhibits no edema and no tenderness.       No midline spinal tenderness. Tenderness and ecchymosis to the ulnar aspect of the left hand. Neurovascularly intact distally. Skin tear to the left elbow. Minimal bony  tenderness. No significant increase in pain with range of motion. Pelvis is stable to rocking. Severe pain with attempted range of motion the left hip. NVI distally. Left lower extremity does not appear to be symmetrically shortness compared to the right.  Neurological: She is alert and oriented to person, place, and time. No cranial nerve deficit. She exhibits normal muscle tone.  Skin: Skin is warm and dry.  Psychiatric: She has a normal mood and affect. Her behavior is normal. Thought content normal.    ED Course  Procedures (including critical care time)  Labs Reviewed  CBC - Abnormal; Notable for the following:    WBC 14.0 (*)     Hemoglobin 11.4 (*)     HCT 34.5 (*)     All other components within normal limits  COMPREHENSIVE METABOLIC PANEL - Abnormal; Notable for the following:    Glucose, Bld 105 (*)     BUN 36 (*)     Creatinine, Ser 1.73 (*)     GFR calc non Af Amer 26 (*)     GFR calc Af Amer 30 (*)     All other components within normal limits  APTT  PROTIME-INR  TYPE AND SCREEN  URINALYSIS, MICROSCOPIC ONLY  ABO/RH   Dg Elbow Complete Left  03/31/2012  *RADIOLOGY REPORT*  Clinical Data: Post fall, now with left-sided elbow pain  LEFT ELBOW - COMPLETE 3+ VIEW  Comparison: None.  Findings: No displaced fracture or elbow joint effusion.  Joint spaces appear preserved.  Regional soft tissues are normal.  No radiopaque foreign body.  IMPRESSION: No displaced fracture or elbow joint effusion.   Original Report Authenticated By: Tacey Ruiz, MD    Dg Wrist Complete Left  03/31/2012  *RADIOLOGY REPORT*  Clinical Data: Post fall, now with left sided wrist pain  LEFT WRIST - COMPLETE 3+ VIEW  Comparison: None.  Findings:  No fracture or dislocation.  Regional soft tissues are normal.  No definite displaced of the pronator quadratus fat pad, though the lateral is limited due to obliquity.  Mild degenerative changes of the STT joints of the base of the thumb.  IMPRESSION: No  fracture.  Original Report Authenticated By: Tacey Ruiz, MD    Dg Femur Left  03/31/2012  *RADIOLOGY REPORT*  Clinical Data: Post fall, now with leg pain  LEFT FEMUR - 2 VIEW  Comparison: None.  Findings:  There is a complete, displaced fracture of the left femoral neck with foreshortening of the fracture fragments and mild varus angulation of the femoral head. No definite intra-articular extension.  No additional fractures are identified.  Scattered vascular calcifications.  Limited visualization of the knees suggests tricompartmental degenerative change.  Mild enthesopathic changes of the patellar tendon insertion site.  IMPRESSION: Complete, displaced fracture of the left femoral neck.   Original Report Authenticated By: Tacey Ruiz, MD    Dg Hand Complete Left  03/31/2012  *RADIOLOGY REPORT*  Clinical Data: Left hand pain post fall  LEFT HAND - COMPLETE 3+ VIEW  Comparison: None.  Findings: Three views of the left hand submitted.  There is diffuse osteopenia.  No acute fracture or subluxation.  Degenerative changes noted distal interphalangeal joint second third fourth and fifth finger.  Degenerative changes proximal interphalangeal joint fifth finger.  Mild degenerative changes first carpal metacarpal joint.  IMPRESSION: No acute fracture or subluxation.  Diffuse osteopenia. Degenerative changes as described above.   Original Report Authenticated By: Natasha Mead, M.D.      1. Fracture of femoral neck, left   2. Fall from slip, trip, or stumble   3. Contusion of arm, left   4. Contusion of nose   5. Unspecified essential hypertension   6. Coronary atherosclerosis of unspecified type of vessel, native or graft   7. Unspecified hypothyroidism   8. Acute renal failure   9. HYPOTHYROIDISM   10. HYPERTENSION   11. ATHEROSCLEROTIC HEART DISEASE   12. BRADYCARDIA   13. TRANSIENT ISCHEMIC ATTACK   14. DIVERTICULOSIS, COLON   15. Hyponatremia       MDM  78 year old female with mechanical  fall  yesterday and today. Left hand pain and facial abrasions from fall yesterday. Left elbow and left hip pain today after another subsequent fall. Imaging significant for a displaced left femoral neck fracture. Closed injury. Neurovascular intact distally. Discussed with orthopedics, Dr Turner Daniels. Will admit to medicine.        Raeford Razor, MD 04/04/12 [phone]

## 2012-03-31 NOTE — H&P (Signed)
[facility] History and Physical  Rachael [name] ZOX:[id number] DOB: 1923/12/21 DOA: 03/31/2012  Referring physician: EDP PCP: Dr. Marvel Plan Specialists: Dr. Turner Daniels   Chief Complaint: Left Hip Pain after Fall  HPI: Rachael [name] is a 78 y.o. female who suffered a second fall where she tripped over her undone shoe laces and fell onto her left side and had increased left hip and left elbow pain.  She also fell 1 day earlier when she was out walking in her neighborhood and tripped over  A manhole which was uneven on the pavement. She fell and hit her face and arms first.  She denies having any syncope or dizziness or problems with weakness or balance associated with the falls.  She also denies having any fevers chills, chest pain or SOB or seizures.    In the ED she was evaluated and found to have an acute fracture of the left femoral neck.  Orthopedics was consulted and Dr. Turner Daniels is to see the patient for evaluation for surgical options.      Review of Systems: The patient denies anorexia, fever, weight loss, vision loss, decreased hearing, hoarseness, chest pain, syncope, dyspnea on exertion, peripheral edema, balance deficits, hemoptysis, abdominal pain, melena, hematochezia, severe indigestion/heartburn, hematuria, incontinence, genital sores, muscle weakness, suspicious skin lesions, transient blindness, difficulty walking, depression, unusual weight change, abnormal bleeding, enlarged lymph nodes, angioedema, and breast masses.    Past Medical History  Diagnosis Date  . Thyroid disease   . Hypertension        Senile Osteoporosis   Past Surgical History  Procedure Date  . Inguinal hernia repair   . Tonsillectomy     Medications:  HOME MEDS: Prior to Admission medications   Medication Sig Start Date End Date Taking? Authorizing Provider  alendronate (FOSAMAX) 70 MG tablet Take 70 mg by mouth every 7 (seven) days. Take with a full glass of water on an empty stomach.   Yes  Historical Provider, MD  aspirin 81 MG chewable tablet Chew 81 mg by mouth daily.   Yes Historical Provider, MD  Biotin 1000 MCG tablet Take 1,000 mcg by mouth daily.   Yes Historical Provider, MD  bisoprolol-hydrochlorothiazide (ZIAC) 10-6.25 MG per tablet Take 1 tablet by mouth daily.   Yes Historical Provider, MD  bumetanide (BUMEX) 1 MG tablet Take 1 mg by mouth daily. Take 1/2 tablet every other day on Monday, Wednesday, Friday and Sunday.   Yes Historical Provider, MD  CALCIUM PO Take 2 capsules by mouth daily.   Yes Historical Provider, MD  levothyroxine (SYNTHROID, LEVOTHROID) 125 MCG tablet Take 125 mcg by mouth daily.   Yes Historical Provider, MD  Multiple Vitamin (MULTIVITAMIN WITH MINERALS) TABS Take 1 tablet by mouth daily.   Yes Historical Provider, MD  Olmesartan Medoxomil (BENICAR PO) Take by mouth daily. 1/4 tablet daily.   Yes Historical Provider, MD  Red Yeast Rice Extract (RED YEAST RICE PO) Take 1 capsule by mouth daily.   Yes Historical Provider, MD  solifenacin (VESICARE) 10 MG tablet Take 2.5 mg by mouth as needed. Bladder issues.   Yes Historical Provider, MD  zinc gluconate 50 MG tablet Take 50 mg by mouth daily.   Yes Historical Provider, MD     No Known Allergies    Social History:  reports that she has quit smoking. She does not have any smokeless tobacco history on file. She reports that she drinks about 14.4 ounces of alcohol per week. She reports that she does  not use illicit drugs.    Family History  Problem Relation Age of Onset  . CVA Mother     died age 17  . Emphysema Sister      Physical Exam:  GEN:  Pleasant Well nourished Well developed Elderly Caucasian Female examined  and in no acute distress; cooperative with exam Filed Vitals:   03/31/12 1724 03/31/12 2143 03/31/12 2240  BP: 160/58 170/46 120/34  Pulse: 64 80 72  Temp: 98.3 F (36.8 C)    TempSrc: Oral    Resp: 22 18 18   SpO2: 90% 98% 97%   Blood pressure 120/34, pulse 72,  temperature 98.3 F (36.8 C), temperature source Oral, resp. rate 18, SpO2 97.00%. PSYCH: She is alert and oriented x4; does not appear anxious does not appear depressed; affect is normal HEENT: Normocephalic and + ecchymosis and edema of nose S/P previous fall, Mucous membranes pink; PERRLA; EOM intact; Fundi:  Benign;  No scleral icterus, Nares: Patent, Oropharynx: Clear, Sparse Fair Dentition, Neck:  FROM, no cervical lymphadenopathy nor thyromegaly or carotid bruit; no JVD; Breasts:: Not examined CHEST WALL: No tenderness CHEST: Normal respiration, clear to auscultation bilaterally HEART: Regular rate and rhythm; no murmurs rubs or gallops BACK: No kyphosis or scoliosis; no CVA tenderness ABDOMEN: Positive Bowel Sounds,  soft non-tender; no masses, no organomegaly. Rectal Exam: Not done EXTREMITIES: No bone or joint deformity; age-appropriate arthropathy of the hands and knees; no cyanosis, clubbing or edema; no ulcerations. Genitalia: not examined PULSES: 2+ and symmetric SKIN: Normal hydration no rash or ulceration CNS: Cranial nerves 2-12 grossly intact no focal neurologic deficit    Labs on Admission:  Basic Metabolic Panel:  Lab 03/31/12 1610  NA 137  K 3.7  CL 101  CO2 23  GLUCOSE 105*  BUN 36*  CREATININE 1.73*  CALCIUM 8.7  MG --  PHOS --   Liver Function Tests:  Lab 03/31/12 2045  AST 34  ALT 30  ALKPHOS 73  BILITOT 0.6  PROT 6.6  ALBUMIN 3.9   No results found for this basename: LIPASE:5,AMYLASE:5 in the last 168 hours No results found for this basename: AMMONIA:5 in the last 168 hours CBC:  Lab 03/31/12 2045  WBC 14.0*  NEUTROABS --  HGB 11.4*  HCT 34.5*  MCV 87.8  PLT 228   Cardiac Enzymes: No results found for this basename: CKTOTAL:5,CKMB:5,CKMBINDEX:5,TROPONINI:5 in the last 168 hours  BNP (last 3 results) No results found for this basename: PROBNP:3 in the last 8760 hours CBG: No results found for this basename: GLUCAP:5 in the last 168  hours  Radiological Exams on Admission: Dg Elbow Complete Left  03/31/2012  *RADIOLOGY REPORT*  Clinical Data: Post fall, now with left-sided elbow pain  LEFT ELBOW - COMPLETE 3+ VIEW  Comparison: None.  Findings: No displaced fracture or elbow joint effusion.  Joint spaces appear preserved.  Regional soft tissues are normal.  No radiopaque foreign body.  IMPRESSION: No displaced fracture or elbow joint effusion.   Original Report Authenticated By: Tacey Ruiz, MD    Dg Wrist Complete Left  03/31/2012  *RADIOLOGY REPORT*  Clinical Data: Post fall, now with left sided wrist pain  LEFT WRIST - COMPLETE 3+ VIEW  Comparison: None.  Findings:  No fracture or dislocation.  Regional soft tissues are normal.  No definite displaced of the pronator quadratus fat pad, though the lateral is limited due to obliquity.  Mild degenerative changes of the STT joints of the base of the thumb.  IMPRESSION: No fracture.   Original Report Authenticated By: Tacey Ruiz, MD    Dg Femur Left  03/31/2012  *RADIOLOGY REPORT*  Clinical Data: Post fall, now with leg pain  LEFT FEMUR - 2 VIEW  Comparison: None.  Findings:  There is a complete, displaced fracture of the left femoral neck with foreshortening of the fracture fragments and mild varus angulation of the femoral head. No definite intra-articular extension.  No additional fractures are identified.  Scattered vascular calcifications.  Limited visualization of the knees suggests tricompartmental degenerative change.  Mild enthesopathic changes of the patellar tendon insertion site.  IMPRESSION: Complete, displaced fracture of the left femoral neck.   Original Report Authenticated By: Tacey Ruiz, MD    Dg Chest Portable 1 View  03/31/2012  *RADIOLOGY REPORT*  Clinical Data: Preoperative chest radiograph.  PORTABLE CHEST - 1 VIEW  Comparison: Chest radiograph performed 01/03/2010  Findings: The lungs are well-aerated.  Vascular congestion may be transient in nature.  No  significant pulmonary edema is seen.  No pleural effusion or pneumothorax identified.  Focal density at the left lung base appears to reflect the overlying nipple shadow and previously noted chronic scarring.  The cardiomediastinal silhouette is within normal limits.  No acute osseous abnormalities are seen.  IMPRESSION: Vascular congestion may reflect the patient's recent fall; lungs remain clear.  No displaced rib fractures seen.   Original Report Authenticated By: Tonia Ghent, M.D.    Dg Hand Complete Left  03/31/2012  *RADIOLOGY REPORT*  Clinical Data: Left hand pain post fall  LEFT HAND - COMPLETE 3+ VIEW  Comparison: None.  Findings: Three views of the left hand submitted.  There is diffuse osteopenia.  No acute fracture or subluxation.  Degenerative changes noted distal interphalangeal joint second third fourth and fifth finger.  Degenerative changes proximal interphalangeal joint fifth finger.  Mild degenerative changes first carpal metacarpal joint.  IMPRESSION: No acute fracture or subluxation.  Diffuse osteopenia. Degenerative changes as described above.   Original Report Authenticated By: Natasha Mead, M.D.     EKG: Independently reviewed.   Assessment: Principal Problem:  *Fracture of femoral neck, left Active Problems:  Fall from slip, trip, or stumble  HYPOTHYROIDISM  HYPERTENSION  Contusion of arm, left  Contusion of nose    Plan:   Admit to MED/SURG Bed Ortho: Dr. Turner Daniels to see in AM Pain Control Reconcile Home Medications DVT prophylaxis SCDs   Code Status:  FULL CODE Family Communication:  N/A Disposition Plan:  TBA  Time spent: 40 Minutes  Ron Parker [facility] Pager [phone]  If 7PM-7AM, please contact night-coverage [email or web address] Password [email or web address] 03/31/2012, 11:35 PM

## 2012-04-01 ENCOUNTER — Encounter (HOSPITAL_COMMUNITY): Payer: Self-pay | Admitting: *Deleted

## 2012-04-01 ENCOUNTER — Inpatient Hospital Stay (HOSPITAL_COMMUNITY): Payer: Medicare Other

## 2012-04-01 ENCOUNTER — Encounter (HOSPITAL_COMMUNITY): Payer: Self-pay | Admitting: Anesthesiology

## 2012-04-01 ENCOUNTER — Inpatient Hospital Stay (HOSPITAL_COMMUNITY): Payer: Medicare Other | Admitting: Anesthesiology

## 2012-04-01 ENCOUNTER — Encounter (HOSPITAL_COMMUNITY)
Admission: EM | Disposition: A | Discharge status: Skilled Nursing Facility | Payer: Self-pay | Source: Home / Self Care | Attending: Internal Medicine

## 2012-04-01 HISTORY — PX: HIP ARTHROPLASTY: SHX981

## 2012-04-01 LAB — CBC
Hemoglobin: 10.5 g/dL — ABNORMAL LOW (ref 12.0–15.0)
MCH: 29.4 pg (ref 26.0–34.0)
MCV: 89.1 fL (ref 78.0–100.0)
Platelets: 184 10*3/uL (ref 150–400)
RBC: 3.57 MIL/uL — ABNORMAL LOW (ref 3.87–5.11)
WBC: 10.5 10*3/uL (ref 4.0–10.5)

## 2012-04-01 LAB — BASIC METABOLIC PANEL
CO2: 25 mEq/L (ref 19–32)
Calcium: 8 mg/dL — ABNORMAL LOW (ref 8.4–10.5)
Chloride: 105 mEq/L (ref 96–112)
Glucose, Bld: 118 mg/dL — ABNORMAL HIGH (ref 70–99)
Potassium: 4.2 mEq/L (ref 3.5–5.1)
Sodium: 138 mEq/L (ref 135–145)

## 2012-04-01 LAB — ABO/RH: ABO/RH(D): O POS

## 2012-04-01 SURGERY — HEMIARTHROPLASTY, HIP, DIRECT ANTERIOR APPROACH, FOR FRACTURE
Anesthesia: General | Site: Hip | Laterality: Left | Wound class: Clean

## 2012-04-01 MED ORDER — MENTHOL 3 MG MT LOZG: 1.0000 | LOZENGE | OROMUCOSAL | Status: DC | PRN

## 2012-04-01 MED ORDER — KCL IN DEXTROSE-NACL 10-5-0.45 MEQ/L-%-% IV SOLN
INTRAVENOUS | Status: DC
  Administered 2012-04-01 – 2012-04-03 (×5): via INTRAVENOUS
  Filled 2012-04-01 (×7): qty 1000

## 2012-04-01 MED ORDER — BISACODYL 5 MG PO TBEC
5.0000 mg | DELAYED_RELEASE_TABLET | Freq: Every day | ORAL | Status: DC | PRN
  Administered 2012-04-02 – 2012-04-03 (×2): 5 mg via ORAL
  Filled 2012-04-01 (×2): qty 1

## 2012-04-01 MED ORDER — ONDANSETRON HCL 4 MG/2ML IJ SOLN
INTRAMUSCULAR | Status: DC | PRN
  Administered 2012-04-01: 4 mg via INTRAVENOUS

## 2012-04-01 MED ORDER — ACETAMINOPHEN 650 MG RE SUPP: 650.0000 mg | Freq: Four times a day (QID) | RECTAL | Status: DC | PRN

## 2012-04-01 MED ORDER — DARIFENACIN HYDROBROMIDE ER 15 MG PO TB24
15.0000 mg | ORAL_TABLET | Freq: Every day | ORAL | Status: DC
  Administered 2012-04-01 – 2012-04-04 (×4): 15 mg via ORAL
  Filled 2012-04-01 (×4): qty 1

## 2012-04-01 MED ORDER — ALENDRONATE SODIUM 70 MG PO TABS: 70.0000 mg | ORAL_TABLET | ORAL | Status: DC

## 2012-04-01 MED ORDER — FENTANYL CITRATE 0.05 MG/ML IJ SOLN
INTRAMUSCULAR | Status: DC | PRN
  Administered 2012-04-01 (×5): 25 ug via INTRAVENOUS

## 2012-04-01 MED ORDER — BISOPROLOL-HYDROCHLOROTHIAZIDE 10-6.25 MG PO TABS: 1.0000 | ORAL_TABLET | Freq: Every day | ORAL | Status: DC

## 2012-04-01 MED ORDER — CEFUROXIME SODIUM 1.5 G IJ SOLR
INTRAMUSCULAR | Status: DC | PRN
  Administered 2012-04-01: 1.5 g

## 2012-04-01 MED ORDER — BUPIVACAINE-EPINEPHRINE 0.5% -1:200000 IJ SOLN
INTRAMUSCULAR | Status: DC | PRN
  Administered 2012-04-01: 10 mL

## 2012-04-01 MED ORDER — METOCLOPRAMIDE HCL 5 MG/ML IJ SOLN: 5.0000 mg | Freq: Three times a day (TID) | INTRAMUSCULAR | Status: DC | PRN

## 2012-04-01 MED ORDER — PROPOFOL 10 MG/ML IV BOLUS
INTRAVENOUS | Status: DC | PRN
  Administered 2012-04-01: 100 mg via INTRAVENOUS

## 2012-04-01 MED ORDER — DEXTROSE-NACL 5-0.45 % IV SOLN: INTRAVENOUS | Status: DC

## 2012-04-01 MED ORDER — LEVOTHYROXINE SODIUM 125 MCG PO TABS
125.0000 ug | ORAL_TABLET | Freq: Every day | ORAL | Status: DC
  Administered 2012-04-01 – 2012-04-04 (×4): 125 ug via ORAL
  Filled 2012-04-01 (×6): qty 1

## 2012-04-01 MED ORDER — PHENYLEPHRINE HCL 10 MG/ML IJ SOLN
INTRAMUSCULAR | Status: DC | PRN
  Administered 2012-04-01 (×3): 40 ug via INTRAVENOUS

## 2012-04-01 MED ORDER — LACTATED RINGERS IV SOLN
INTRAVENOUS | Status: DC | PRN
  Administered 2012-04-01 (×2): via INTRAVENOUS

## 2012-04-01 MED ORDER — CEFUROXIME SODIUM 1.5 G IV SOLR
1.5000 g | INTRAVENOUS | Status: AC
  Filled 2012-04-01: qty 1.5

## 2012-04-01 MED ORDER — METOPROLOL TARTRATE 1 MG/ML IV SOLN
5.0000 mg | Freq: Three times a day (TID) | INTRAVENOUS | Status: DC
  Administered 2012-04-01 – 2012-04-02 (×3): 5 mg via INTRAVENOUS
  Filled 2012-04-01 (×7): qty 5

## 2012-04-01 MED ORDER — SUCCINYLCHOLINE CHLORIDE 20 MG/ML IJ SOLN
INTRAMUSCULAR | Status: DC | PRN
  Administered 2012-04-01: 100 mg via INTRAVENOUS

## 2012-04-01 MED ORDER — DOCUSATE SODIUM 100 MG PO CAPS
100.0000 mg | ORAL_CAPSULE | Freq: Two times a day (BID) | ORAL | Status: DC
  Administered 2012-04-01 – 2012-04-04 (×6): 100 mg via ORAL

## 2012-04-01 MED ORDER — METHOCARBAMOL 500 MG PO TABS
500.0000 mg | ORAL_TABLET | Freq: Four times a day (QID) | ORAL | Status: DC | PRN
  Administered 2012-04-02: 500 mg via ORAL
  Filled 2012-04-01: qty 1

## 2012-04-01 MED ORDER — ACETAMINOPHEN 325 MG PO TABS: 650.0000 mg | ORAL_TABLET | Freq: Four times a day (QID) | ORAL | Status: DC | PRN

## 2012-04-01 MED ORDER — CEFAZOLIN SODIUM-DEXTROSE 2-3 GM-% IV SOLR
INTRAVENOUS | Status: DC | PRN
  Administered 2012-04-01: 2 g via INTRAVENOUS

## 2012-04-01 MED ORDER — DEXTROSE 5 % IV SOLN
1.5000 g | INTRAVENOUS | Status: DC
  Filled 2012-04-01: qty 1.5

## 2012-04-01 MED ORDER — LACTATED RINGERS IV SOLN: INTRAVENOUS | Status: DC

## 2012-04-01 MED ORDER — HYDROMORPHONE HCL PF 1 MG/ML IJ SOLN: 0.1000 mg | INTRAMUSCULAR | Status: DC | PRN

## 2012-04-01 MED ORDER — PROMETHAZINE HCL 25 MG/ML IJ SOLN: 6.2500 mg | INTRAMUSCULAR | Status: DC | PRN

## 2012-04-01 MED ORDER — PHENOL 1.4 % MT LIQD: 1.0000 | OROMUCOSAL | Status: DC | PRN

## 2012-04-01 MED ORDER — BUMETANIDE 1 MG PO TABS
1.0000 mg | ORAL_TABLET | Freq: Every day | ORAL | Status: DC
  Administered 2012-04-01 – 2012-04-03 (×3): 1 mg via ORAL
  Filled 2012-04-01 (×3): qty 1

## 2012-04-01 MED ORDER — CEFAZOLIN SODIUM-DEXTROSE 2-3 GM-% IV SOLR: 2.0000 g | INTRAVENOUS | Status: DC

## 2012-04-01 MED ORDER — ASPIRIN EC 325 MG PO TBEC
325.0000 mg | DELAYED_RELEASE_TABLET | Freq: Two times a day (BID) | ORAL | Status: DC
  Administered 2012-04-01 – 2012-04-04 (×6): 325 mg via ORAL
  Filled 2012-04-01 (×7): qty 1

## 2012-04-01 MED ORDER — METOCLOPRAMIDE HCL 10 MG PO TABS: 5.0000 mg | ORAL_TABLET | Freq: Three times a day (TID) | ORAL | Status: DC | PRN

## 2012-04-01 MED ORDER — METHOCARBAMOL 100 MG/ML IJ SOLN
500.0000 mg | Freq: Four times a day (QID) | INTRAVENOUS | Status: DC | PRN
  Filled 2012-04-01: qty 5

## 2012-04-01 MED ORDER — MAGNESIUM HYDROXIDE 400 MG/5ML PO SUSP: 30.0000 mL | Freq: Every day | ORAL | Status: DC | PRN

## 2012-04-01 MED ORDER — FLEET ENEMA 7-19 GM/118ML RE ENEM: 1.0000 | ENEMA | Freq: Once | RECTAL | Status: AC | PRN

## 2012-04-01 MED ORDER — ONDANSETRON HCL 4 MG/2ML IJ SOLN
4.0000 mg | Freq: Four times a day (QID) | INTRAMUSCULAR | Status: DC | PRN
  Administered 2012-04-02: 4 mg via INTRAVENOUS
  Filled 2012-04-01: qty 2

## 2012-04-01 MED ORDER — EPHEDRINE SULFATE 50 MG/ML IJ SOLN
INTRAMUSCULAR | Status: DC | PRN
  Administered 2012-04-01: 10 mg via INTRAVENOUS

## 2012-04-01 MED ORDER — ONDANSETRON HCL 4 MG PO TABS: 4.0000 mg | ORAL_TABLET | Freq: Four times a day (QID) | ORAL | Status: DC | PRN

## 2012-04-01 MED ORDER — OLMESARTAN 10 MG HALF TABLET
10.0000 mg | ORAL_TABLET | Freq: Every day | ORAL | Status: DC
  Administered 2012-04-01 – 2012-04-03 (×3): 10 mg via ORAL
  Filled 2012-04-01 (×3): qty 1

## 2012-04-01 MED ORDER — HYDROMORPHONE HCL PF 1 MG/ML IJ SOLN: 0.2500 mg | INTRAMUSCULAR | Status: DC | PRN

## 2012-04-01 MED ORDER — BISOPROLOL-HYDROCHLOROTHIAZIDE 10-6.25 MG PO TABS
1.0000 | ORAL_TABLET | Freq: Every day | ORAL | Status: DC
  Administered 2012-04-01 – 2012-04-04 (×4): 1 via ORAL
  Filled 2012-04-01 (×4): qty 1

## 2012-04-01 MED ORDER — HYDROCODONE-ACETAMINOPHEN 5-325 MG PO TABS
1.0000 | ORAL_TABLET | Freq: Four times a day (QID) | ORAL | Status: DC | PRN
  Administered 2012-04-02: 1 via ORAL
  Administered 2012-04-03 (×2): 2 via ORAL
  Administered 2012-04-04: 1 via ORAL
  Filled 2012-04-01: qty 2
  Filled 2012-04-01: qty 1
  Filled 2012-04-01: qty 2
  Filled 2012-04-01: qty 1

## 2012-04-01 SURGICAL SUPPLY — 59 items
BAG SPEC THK2 15X12 ZIP CLS (MISCELLANEOUS) ×1
BAG ZIPLOCK 12X15 (MISCELLANEOUS) ×2 IMPLANT
BIT DRILL 2.0MX128MM (BIT) ×2 IMPLANT
BLADE EXTENDED COATED 6.5IN (ELECTRODE) ×2 IMPLANT
BLADE HEX COATED 2.75 (ELECTRODE) ×2 IMPLANT
BLADE SAW SAG 73X25 THK (BLADE) ×1
BLADE SAW SGTL 73X25 THK (BLADE) ×1 IMPLANT
BRUSH FEMORAL CANAL (MISCELLANEOUS) ×2 IMPLANT
CEMENT BONE DEPUY (Cement) ×4 IMPLANT
CEMENT RESTRICTOR DEPUY SZ 3 (Cement) ×1 IMPLANT
CLOTH BEACON ORANGE TIMEOUT ST (SAFETY) ×2 IMPLANT
DRAPE INCISE IOBAN 66X45 STRL (DRAPES) ×2 IMPLANT
DRAPE LG THREE QUARTER DISP (DRAPES) ×2 IMPLANT
DRAPE ORTHO SPLIT 77X108 STRL (DRAPES) ×4
DRAPE POUCH INSTRU U-SHP 10X18 (DRAPES) ×2 IMPLANT
DRAPE SURG ORHT 6 SPLT 77X108 (DRAPES) ×2 IMPLANT
DRAPE U-SHAPE 47X51 STRL (DRAPES) ×2 IMPLANT
DRILL BIT 7/64X5 (BIT) ×2 IMPLANT
DRSG MEPILEX BORDER 4X4 (GAUZE/BANDAGES/DRESSINGS) ×2 IMPLANT
DRSG MEPILEX BORDER 4X8 (GAUZE/BANDAGES/DRESSINGS) ×2 IMPLANT
DRSG PAD ABDOMINAL 8X10 ST (GAUZE/BANDAGES/DRESSINGS) ×2 IMPLANT
ELECT REM PT RETURN 9FT ADLT (ELECTROSURGICAL) ×2
ELECTRODE REM PT RTRN 9FT ADLT (ELECTROSURGICAL) ×1 IMPLANT
FACESHIELD LNG OPTICON STERILE (SAFETY) ×4 IMPLANT
GAUZE XEROFORM 1X8 LF (GAUZE/BANDAGES/DRESSINGS) ×4 IMPLANT
GLOVE BIO SURGEON STRL SZ7 (GLOVE) ×2 IMPLANT
GLOVE BIO SURGEON STRL SZ7.5 (GLOVE) ×2 IMPLANT
GLOVE BIOGEL PI IND STRL 7.0 (GLOVE) ×1 IMPLANT
GLOVE BIOGEL PI IND STRL 8 (GLOVE) ×1 IMPLANT
GLOVE BIOGEL PI INDICATOR 7.0 (GLOVE) ×1
GLOVE BIOGEL PI INDICATOR 8 (GLOVE) ×1
HANDPIECE INTERPULSE COAX TIP (DISPOSABLE) ×2
HOOD PEEL AWAY FACE SHEILD DIS (HOOD) ×4 IMPLANT
IV NS IRRIG 3000ML ARTHROMATIC (IV SOLUTION) ×2 IMPLANT
KIT BASIN OR (CUSTOM PROCEDURE TRAY) ×2 IMPLANT
NEEDLE HYPO 22GX1.5 SAFETY (NEEDLE) ×2 IMPLANT
NEEDLE MAYO .5 CIRCLE (NEEDLE) ×2 IMPLANT
NS IRRIG 1000ML POUR BTL (IV SOLUTION) ×2 IMPLANT
PACK TOTAL JOINT (CUSTOM PROCEDURE TRAY) ×2 IMPLANT
PASSER SUT SWANSON 36MM LOOP (INSTRUMENTS) ×2 IMPLANT
POSITIONER SURGICAL ARM (MISCELLANEOUS) ×2 IMPLANT
PRESSURIZER FEMORAL UNIV (MISCELLANEOUS) ×2 IMPLANT
SET HNDPC FAN SPRY TIP SCT (DISPOSABLE) ×1 IMPLANT
SPONGE GAUZE 4X4 12PLY (GAUZE/BANDAGES/DRESSINGS) ×4 IMPLANT
STAPLER VISISTAT 35W (STAPLE) ×2 IMPLANT
SUCTION FRAZIER TIP 10 FR DISP (SUCTIONS) ×2 IMPLANT
SUT ETHIBOND 2 (SUTURE) ×8 IMPLANT
SUT ETHILON 3 0 PS 1 (SUTURE) ×4 IMPLANT
SUT NYLON 3 0 (SUTURE) IMPLANT
SUT VIC AB 0 CT1 36 (SUTURE) ×4 IMPLANT
SUT VIC AB 0 CTX 27 (SUTURE) ×4 IMPLANT
SUT VIC AB 1 CTX 36 (SUTURE) ×4
SUT VIC AB 1 CTX36XBRD ANBCTR (SUTURE) ×2 IMPLANT
SUT VIC AB 2-0 CT1 27 (SUTURE) ×4
SUT VIC AB 2-0 CT1 TAPERPNT 27 (SUTURE) ×2 IMPLANT
SYR 20CC LL (SYRINGE) ×2 IMPLANT
TOWER CARTRIDGE SMART MIX (DISPOSABLE) ×2 IMPLANT
TRAY FOLEY CATH 14FRSI W/METER (CATHETERS) ×1 IMPLANT
WATER STERILE IRR 1500ML POUR (IV SOLUTION) ×2 IMPLANT

## 2012-04-01 NOTE — Transfer of Care (Signed)
Immediate Anesthesia Transfer of Care Note  Patient: Rachael [name]  Procedure(s) Performed: Procedure(s) (LRB) with comments: ARTHROPLASTY BIPOLAR HIP (Left)  Patient Location: PACU  Anesthesia Type:General  Level of Consciousness: patient cooperative  Airway & Oxygen Therapy: Patient Spontanous Breathing and Patient connected to face mask oxygen  Post-op Assessment: Report given to PACU RN and Post -op Vital signs reviewed and stable  Post vital signs: Reviewed and stable  Complications: No apparent anesthesia complications

## 2012-04-01 NOTE — Anesthesia Preprocedure Evaluation (Addendum)
Anesthesia Evaluation  Patient identified by MRN, date of birth, ID band Patient awake    Reviewed: Allergy & Precautions, H&P , NPO status , Patient's Chart, lab work & pertinent test results  Airway Mallampati: II TM Distance: >3 FB Neck ROM: Full    Dental  (+) Edentulous Upper, Partial Lower and Dental Advisory Given   Pulmonary neg pulmonary ROS,  CXR: vascular congestion, lungs clear. breath sounds clear to auscultation  Pulmonary exam normal       Cardiovascular hypertension, Pt. on home beta blockers and Pt. on medications + CAD + dysrhythmias Rhythm:Regular Rate:Normal  ECHO 01/19/10 reviewed. EF 65%  Cardiology note from Feb 2012 reviewed. H/O bradycardia, slow atrial tachycardia.  HGB 10.5   Neuro/Psych TIAnegative psych ROS   GI/Hepatic negative GI ROS, Neg liver ROS,   Endo/Other  Hypothyroidism   Renal/GU Cr 1.65 K 4.2  negative genitourinary   Musculoskeletal negative musculoskeletal ROS (+)   Abdominal   Peds negative pediatric ROS (+)  Hematology negative hematology ROS (+)   Anesthesia Other Findings   Reproductive/Obstetrics negative OB ROS                       Anesthesia Physical Anesthesia Plan  ASA: III  Anesthesia Plan: General   Post-op Pain Management:    Induction: Intravenous  Airway Management Planned: Oral ETT  Additional Equipment:   Intra-op Plan:   Post-operative Plan: Extubation in OR  Informed Consent: I have reviewed the patients History and Physical, chart, labs and discussed the procedure including the risks, benefits and alternatives for the proposed anesthesia with the patient or authorized representative who has indicated his/her understanding and acceptance.   Dental advisory given  Plan Discussed with: CRNA  Anesthesia Plan Comments: (Discussed r/b/a general versus spinal. Patient prefers general.)       Anesthesia Quick  Evaluation

## 2012-04-01 NOTE — Progress Notes (Signed)
[facility] PROGRESS NOTE  Rachael [name]:[id number] DOB: Dec 06, 1923 DOA: 04-30-12 PCP: No primary provider on file.  Assessment/Plan: Fracture of femoral neck, left Patient to the OR today.   HYPOTHYROIDISM Will restart her medications when she is eating. Patient is n.p.o. for the OR.  HYPERTENSION Will place her on Lopressor IV while she is n.p.o.   restart home meds tomorrow.    Code Status: Full Code Family Communication: Discussed with patient Disposition Plan: in 2-3 days Rachael Posey  MD  [facility] Pager [phone]  If 7PM-7AM, please contact night-coverage [email or web address] Password [email or web address] 04/01/2012, 12:00 PM   LOS: 1 day   Brief narrative: None  Consultants:  Ortho  Procedures: Femoral neck Fx surg 11/9  Antibiotics:  none ?  HPI/Subjective: Patient feels fine except being hungry.  Objective: Filed Vitals:   04/01/12 0600 04/01/12 1000 04/01/12 1046 04/01/12 1047  BP: 151/91 148/62 147/63   Pulse: 72 64 65   Temp: 98.5 F (36.9 C)  98.4 F (36.9 C)   TempSrc: Oral  Oral   Resp: 16  16   Height:      Weight:      SpO2: 91%  87% 93%    Intake/Output Summary (Last 24 hours) at 04/01/12 1200 Last data filed at 04/01/12 0115  Gross per 24 hour  Intake      0 ml  Output    600 ml  Net   -600 ml    Exam:   General: Patient is lying in bed. She does not seem to be in any acute distress.   Cardiovascular: Regular rate rhythm  ABD: Soft nontender nondistended positive bowel  Respiratory: Clear to auscultation bilaterally  Ext: No edema  Data Reviewed: Basic Metabolic Panel:  Lab 04/01/12 8295 April 30, 2012 2045  NA 138 137  K 4.2 3.7  CL 105 101  CO2 25 23  GLUCOSE 118* 105*  BUN 33* 36*  CREATININE 1.65* 1.73*  CALCIUM 8.0* 8.7  MG -- --  PHOS -- --   Liver Function Tests:  Lab 04/30/12 2045  AST 34  ALT 30  ALKPHOS 73  BILITOT 0.6  PROT 6.6  ALBUMIN 3.9   CBC:  Lab 04/01/12 0520 Apr 30, 2012 2045    WBC 10.5 14.0*  NEUTROABS -- --  HGB 10.5* 11.4*  HCT 31.8* 34.5*  MCV 89.1 87.8  PLT 184 228    Recent Results (from the past 240 hour(s))  SURGICAL PCR SCREEN     Status: Abnormal   Collection Time   04/01/12  1:41 AM      Component Value Range Status Comment   MRSA, PCR NEGATIVE  NEGATIVE Final    Staphylococcus aureus POSITIVE (*) NEGATIVE Final      Studies: Dg Elbow Complete Left  04-30-2012  *RADIOLOGY REPORT*  Clinical Data: Post fall, now with left-sided elbow pain  LEFT ELBOW - COMPLETE 3+ VIEW  Comparison: None.  Findings: No displaced fracture or elbow joint effusion.  Joint spaces appear preserved.  Regional soft tissues are normal.  No radiopaque foreign body.  IMPRESSION: No displaced fracture or elbow joint effusion.   Original Report Authenticated By: Tacey Ruiz, MD    Dg Wrist Complete Left  04/30/12  *RADIOLOGY REPORT*  Clinical Data: Post fall, now with left sided wrist pain  LEFT WRIST - COMPLETE 3+ VIEW  Comparison: None.  Findings:  No fracture or dislocation.  Regional soft tissues are normal.  No definite displaced of the pronator quadratus fat  pad, though the lateral is limited due to obliquity.  Mild degenerative changes of the STT joints of the base of the thumb.  IMPRESSION: No fracture.   Original Report Authenticated By: Tacey Ruiz, MD    Dg Femur Left  03/31/2012  *RADIOLOGY REPORT*  Clinical Data: Post fall, now with leg pain  LEFT FEMUR - 2 VIEW  Comparison: None.  Findings:  There is a complete, displaced fracture of the left femoral neck with foreshortening of the fracture fragments and mild varus angulation of the femoral head. No definite intra-articular extension.  No additional fractures are identified.  Scattered vascular calcifications.  Limited visualization of the knees suggests tricompartmental degenerative change.  Mild enthesopathic changes of the patellar tendon insertion site.  IMPRESSION: Complete, displaced fracture of the left  femoral neck.   Original Report Authenticated By: Tacey Ruiz, MD    Dg Chest Portable 1 View  03/31/2012  *RADIOLOGY REPORT*  Clinical Data: Preoperative chest radiograph.  PORTABLE CHEST - 1 VIEW  Comparison: Chest radiograph performed 01/03/2010  Findings: The lungs are well-aerated.  Vascular congestion may be transient in nature.  No significant pulmonary edema is seen.  No pleural effusion or pneumothorax identified.  Focal density at the left lung base appears to reflect the overlying nipple shadow and previously noted chronic scarring.  The cardiomediastinal silhouette is within normal limits.  No acute osseous abnormalities are seen.  IMPRESSION: Vascular congestion may reflect the patient's recent fall; lungs remain clear.  No displaced rib fractures seen.   Original Report Authenticated By: Tonia Ghent, M.D.    Dg Hand Complete Left  03/31/2012  *RADIOLOGY REPORT*  Clinical Data: Left hand pain post fall  LEFT HAND - COMPLETE 3+ VIEW  Comparison: None.  Findings: Three views of the left hand submitted.  There is diffuse osteopenia.  No acute fracture or subluxation.  Degenerative changes noted distal interphalangeal joint second third fourth and fifth finger.  Degenerative changes proximal interphalangeal joint fifth finger.  Mild degenerative changes first carpal metacarpal joint.  IMPRESSION: No acute fracture or subluxation.  Diffuse osteopenia. Degenerative changes as described above.   Original Report Authenticated By: Natasha Mead, M.D.     Scheduled Meds:   . [COMPLETED] fentaNYL  50 mcg Intravenous Once  . [COMPLETED]  HYDROmorphone (DILAUDID) injection  0.5 mg Intravenous Once  . metoprolol  5 mg Intravenous Q8H  . [COMPLETED] ondansetron (ZOFRAN) IV  4 mg Intravenous Once  . [COMPLETED] sodium chloride  500 mL Intravenous Once   Continuous Infusions:   . sodium chloride 75 mL/hr at 04/01/12 0113     Time Spent:25 min

## 2012-04-01 NOTE — Consult Note (Signed)
Reason for Consult: Displaced left femoral leg fracture acute Referring Physician: Dr. Maryella Shivers, [facility]  Rachael [name] is an 78 y.o. female.  HPI: Patient a 78 y.o. female, widowed who lives by her self in a town home independently. 2 days ago she was out walking in her neighborhood and tripped over A manhole which was uneven on the pavement. She fell and hit her face and arms first. fell and bruised her left wrist and elbow, yesterday she tripped over her undone shoe laces and fell onto her left side and had increased left hip and left elbow pain. At that point she was unable to get up. She's not had any solid food now for almost 3 days. She denies having any syncope or dizziness or problems with weakness or balance associated with the falls. She also denies having any fevers chills, chest pain or SOB or seizures. EMS was called, she was transported to [facility] emergency room where x-rays revealed a displaced left femoral neck fracture. She is admitted by the hospitalist, I have been called in consultation for operative repair of her displaced femoral neck fracture.  Review of Systems: The patient denies anorexia, fever, weight loss, vision loss, decreased hearing, hoarseness, chest pain, syncope, dyspnea on exertion, peripheral edema, balance deficits, hemoptysis, abdominal pain, melena, hematochezia, severe indigestion/heartburn, hematuria, incontinence, genital sores, muscle weakness, suspicious skin lesions, transient blindness, difficulty walking, depression, unusual weight change, abnormal bleeding, enlarged lymph nodes, angioedema, and breast masses.    Past Medical History  Diagnosis Date  . Thyroid disease   . Hypertension   . Senile osteoporosis     Past Surgical History  Procedure Date  . Inguinal hernia repair   . Tonsillectomy     Family History  Problem Relation Age of Onset  . CVA Mother     died age 66  . Emphysema Sister     Social History:   reports that she has quit smoking. She does not have any smokeless tobacco history on file. She reports that she drinks about 14.4 ounces of alcohol per week. She reports that she does not use illicit drugs.  Allergies: No Known Allergies  Medications: I have reviewed the patient's current medications.  Results for orders placed during the hospital encounter of 03/31/12 (from the past 48 hour(s))  CBC     Status: Abnormal   Collection Time   03/31/12  8:45 PM      Component Value Range Comment   WBC 14.0 (*) 4.0 - 10.5 K/uL    RBC 3.93  3.87 - 5.11 MIL/uL    Hemoglobin 11.4 (*) 12.0 - 15.0 g/dL    HCT 16.1 (*) 09.6 - 46.0 %    MCV 87.8  78.0 - 100.0 fL    MCH 29.0  26.0 - 34.0 pg    MCHC 33.0  30.0 - 36.0 g/dL    RDW 04.5  40.9 - 81.1 %    Platelets 228  150 - 400 K/uL   COMPREHENSIVE METABOLIC PANEL     Status: Abnormal   Collection Time   03/31/12  8:45 PM      Component Value Range Comment   Sodium 137  135 - 145 mEq/L    Potassium 3.7  3.5 - 5.1 mEq/L    Chloride 101  96 - 112 mEq/L    CO2 23  19 - 32 mEq/L    Glucose, Bld 105 (*) 70 - 99 mg/dL    BUN 36 (*) 6 -  23 mg/dL    Creatinine, Ser 1.61 (*) 0.50 - 1.10 mg/dL    Calcium 8.7  8.4 - 09.6 mg/dL    Total Protein 6.6  6.0 - 8.3 g/dL    Albumin 3.9  3.5 - 5.2 g/dL    AST 34  0 - 37 U/L    ALT 30  0 - 35 U/L    Alkaline Phosphatase 73  39 - 117 U/L    Total Bilirubin 0.6  0.3 - 1.2 mg/dL    GFR calc non Af Amer 26 (*) >90 mL/min    GFR calc Af Amer 30 (*) >90 mL/min   APTT     Status: Normal   Collection Time   03/31/12  8:45 PM      Component Value Range Comment   aPTT 29  24 - 37 seconds   PROTIME-INR     Status: Normal   Collection Time   03/31/12  8:45 PM      Component Value Range Comment   Prothrombin Time 14.0  11.6 - 15.2 seconds    INR 1.09  0.00 - 1.49   TYPE AND SCREEN     Status: Normal   Collection Time   03/31/12  8:45 PM      Component Value Range Comment   ABO/RH(D) O POS      Antibody Screen NEG       Sample Expiration 04/03/2012     ABO/RH     Status: Normal   Collection Time   03/31/12  8:45 PM      Component Value Range Comment   ABO/RH(D) O POS     URINALYSIS, MICROSCOPIC ONLY     Status: Normal   Collection Time   03/31/12 10:26 PM      Component Value Range Comment   Color, Urine YELLOW  YELLOW    APPearance CLEAR  CLEAR    Specific Gravity, Urine 1.013  1.005 - 1.030    pH 5.5  5.0 - 8.0    Glucose, UA NEGATIVE  NEGATIVE mg/dL    Hgb urine dipstick NEGATIVE  NEGATIVE    Bilirubin Urine NEGATIVE  NEGATIVE    Ketones, ur NEGATIVE  NEGATIVE mg/dL    Protein, ur NEGATIVE  NEGATIVE mg/dL    Urobilinogen, UA 0.2  0.0 - 1.0 mg/dL    Nitrite NEGATIVE  NEGATIVE    Leukocytes, UA NEGATIVE  NEGATIVE    Bacteria, UA RARE  RARE    Squamous Epithelial / LPF RARE  RARE   SURGICAL PCR SCREEN     Status: Abnormal   Collection Time   04/01/12  1:41 AM      Component Value Range Comment   MRSA, PCR NEGATIVE  NEGATIVE    Staphylococcus aureus POSITIVE (*) NEGATIVE   CBC     Status: Abnormal   Collection Time   04/01/12  5:20 AM      Component Value Range Comment   WBC 10.5  4.0 - 10.5 K/uL    RBC 3.57 (*) 3.87 - 5.11 MIL/uL    Hemoglobin 10.5 (*) 12.0 - 15.0 g/dL    HCT 04.5 (*) 40.9 - 46.0 %    MCV 89.1  78.0 - 100.0 fL    MCH 29.4  26.0 - 34.0 pg    MCHC 33.0  30.0 - 36.0 g/dL    RDW 81.1  91.4 - 78.2 %    Platelets 184  150 - 400 K/uL     Dg  Elbow Complete Left  03/31/2012  *RADIOLOGY REPORT*  Clinical Data: Post fall, now with left-sided elbow pain  LEFT ELBOW - COMPLETE 3+ VIEW  Comparison: None.  Findings: No displaced fracture or elbow joint effusion.  Joint spaces appear preserved.  Regional soft tissues are normal.  No radiopaque foreign body.  IMPRESSION: No displaced fracture or elbow joint effusion.   Original Report Authenticated By: Tacey Ruiz, MD    Dg Wrist Complete Left  03/31/2012  *RADIOLOGY REPORT*  Clinical Data: Post fall, now with left sided wrist  pain  LEFT WRIST - COMPLETE 3+ VIEW  Comparison: None.  Findings:  No fracture or dislocation.  Regional soft tissues are normal.  No definite displaced of the pronator quadratus fat pad, though the lateral is limited due to obliquity.  Mild degenerative changes of the STT joints of the base of the thumb.  IMPRESSION: No fracture.   Original Report Authenticated By: Tacey Ruiz, MD    Dg Femur Left  03/31/2012  *RADIOLOGY REPORT*  Clinical Data: Post fall, now with leg pain  LEFT FEMUR - 2 VIEW  Comparison: None.  Findings:  There is a complete, displaced fracture of the left femoral neck with foreshortening of the fracture fragments and mild varus angulation of the femoral head. No definite intra-articular extension.  No additional fractures are identified.  Scattered vascular calcifications.  Limited visualization of the knees suggests tricompartmental degenerative change.  Mild enthesopathic changes of the patellar tendon insertion site.  IMPRESSION: Complete, displaced fracture of the left femoral neck.   Original Report Authenticated By: Tacey Ruiz, MD    Dg Chest Portable 1 View  03/31/2012  *RADIOLOGY REPORT*  Clinical Data: Preoperative chest radiograph.  PORTABLE CHEST - 1 VIEW  Comparison: Chest radiograph performed 01/03/2010  Findings: The lungs are well-aerated.  Vascular congestion may be transient in nature.  No significant pulmonary edema is seen.  No pleural effusion or pneumothorax identified.  Focal density at the left lung base appears to reflect the overlying nipple shadow and previously noted chronic scarring.  The cardiomediastinal silhouette is within normal limits.  No acute osseous abnormalities are seen.  IMPRESSION: Vascular congestion may reflect the patient's recent fall; lungs remain clear.  No displaced rib fractures seen.   Original Report Authenticated By: Tonia Ghent, M.D.    Dg Hand Complete Left  03/31/2012  *RADIOLOGY REPORT*  Clinical Data: Left hand pain post  fall  LEFT HAND - COMPLETE 3+ VIEW  Comparison: None.  Findings: Three views of the left hand submitted.  There is diffuse osteopenia.  No acute fracture or subluxation.  Degenerative changes noted distal interphalangeal joint second third fourth and fifth finger.  Degenerative changes proximal interphalangeal joint fifth finger.  Mild degenerative changes first carpal metacarpal joint.  IMPRESSION: No acute fracture or subluxation.  Diffuse osteopenia. Degenerative changes as described above.   Original Report Authenticated By: Natasha Mead, M.D.     ROS Blood pressure 151/54, pulse 73, temperature 98.2 F (36.8 C), temperature source Oral, resp. rate 16, height 5\' 8"  (1.727 m), weight 62.3 kg (137 lb 5.6 oz), SpO2 98.00%. Physical Exam the patient is resting supine in her hospital bed, any attempts at movement of the left lower trauma to cause significant pain. The left lower extremity is about 1/2 inch short in relation to the right she has normal pulses to her feet there are no cuts scrapes or abrasions to the skin over the left hip. She is a full  range of motion of the left foot and ankle. Full range of motion of the right lower trembly. There is some mild bruising and contusion of the left elbow and wrist but both joints have a full range of motion. She also has some minor abrasions to her face, these are healing well there is no sign of infection.  Assessment/Plan: Assessment: Displaced left femoral leg fracture in 78 year old woman who is a household ambulator living independently in a town home. She is a widow and lives by herself. She is relatively healthy.  Plan: Cemented left hip hemiarthroplasty has been discussed with the patient and have obtained informed consent. The risks and benefits of surgery have been reviewed and all questions have been answered. This was placed on the schedule for 1:30 PM 04/01/2012. She remained n.p.o. in the meantime with SCDs in place. Her hospital stable probably  be 2-3 days with anticipated transfer to skilled nursing for 1-2 weeks for rehabilitation. I reassured her that she should achieve a level of function similar to what she had prior to the fall.  Denzil Mceachron J 04/01/2012, 6:34 AM

## 2012-04-01 NOTE — Preoperative (Signed)
Beta Blockers   Reason not to administer Beta Blockers:Not Applicable 

## 2012-04-01 NOTE — Progress Notes (Signed)
Fosamax order was rejected. Per [facility] policy, bisphosphonates are held during hospitalization.   Charolotte Eke, PharmD, pager [phone]. 04/01/2012,5:23 PM.

## 2012-04-01 NOTE — Interval H&P Note (Signed)
History and Physical Interval Note:  04/01/2012 2:00 PM  Rachael [name]  has presented today for surgery, with the diagnosis of Left femerol neck fracture  The various methods of treatment have been discussed with the patient and family. After consideration of risks, benefits and other options for treatment, the patient has consented to  Procedure(s) (LRB) with comments: ARTHROPLASTY BIPOLAR HIP (Left) as a surgical intervention .  The patient's history has been reviewed, patient examined, no change in status, stable for surgery.  I have reviewed the patient's chart and labs.  Questions were answered to the patient's satisfaction.     [name]

## 2012-04-01 NOTE — ED Notes (Signed)
Nurse not available to take report. Will recall report soon. Contact # left for nurse to call back. 

## 2012-04-01 NOTE — Progress Notes (Signed)
[facility] nursing:  Dentures obtained from [facility] rn, Heather. Patient placed upper and partial lower dentures in her mouth

## 2012-04-01 NOTE — Op Note (Signed)
Pre Op Dx: L femoral neck fracture   Post Op Dx: Same   Procedure: L hemi-hip arthroplasty using DePuy 51 mm monopolar head, +0 neck, #5 Summit basic stem, 12 mm tip, #3 cement restrictor-the [place] 1 cement with 1500 mg of Zinacef.  Surgeon: [name], MD  Assistant: Shirl Harris PA-C  Anesthesia: General  EBL: 300 cc  Fluids: 1500 cc of crystalloid  Tourniquet Time: Not applicable  Indications: Independent living patient slipped and fell at home on the evening of 10/09/2011. EMS transported to the emergency room with a presumed L hip fracture confirmed by x-ray. To decrease pain increase function and decreased morbidity have recommended and the patient has consented to hemi-hip arthroplasty. Risks and benefits of surgery have been discussed with the patient and her daughter who is a physician. All questions have been answered.  Procedure: Patient was identified by arm band receive preoperative IV antibiotics in the holding area at the hospital. She was taken to operating room for the appropriate anesthetic monitors were attached and general LMA anesthesia induced with the patient in the supine position. She was then rolled into the R lateral decubitus position and fixed there with a pelvic clamp. The Left lower extremity was then prepped and draped in usual sterile fashion from the ankle to the hemipelvis and a timeout procedure performed. A 12 cm incision centered over the greater trochanter allowing a lateral to posterior lateral approach to the hip was then made through the skin and subcutaneous tissue. The IT band was cut in line with skin incision at which point we encountered hematoma. Cobra retractors were placed on the superior hip joint capsule and along the inferior hip joint capsule as well.The piriformis  and short external rotators were tagged and cut after insertion on the intertrochanteric crest and a posterior capsular flap was developed going posterior superior off the  acetabulum out over the fractured femoral neck, exiting posterior inferior. This exposed the fracture, the hip was internally rotated and a standard neck cut was performed with an oscillating saw 1 fingerbreadth above the lesser trochanter. This allowed [place] to extracted the femoral head, which measured at 51-52 mm. Trial reductions were then performed with a 51 and 52 mm trial head on a stick and the 51 mm head had the best fit and fill. There was then flexed and internally rotated exposing the proximal femur which was entered with the initiating reamer, the lateral reamer and then broaches up to a #5 broach which had the best fit and fill to the neck cut. The neck cut was then finished with the neck reamer. And a +0 51 mm trial head was placed on the broach, the hip reduced and stability was found to be excellent he could flex to 90 and 75 of internal rotation and in full extension the hip could not be dislocated in external rotation. We then size for a #3 cement restrictor which was inserted without difficulty and the proximal femur was pulse device clean and dried with suction and sponges. A double batch of DePuy 1 cement was then mixed with 1500 mg of Zinacef and injected into the proximal femur under pressure followed by a #4 Summit basic stem with a 12 mm cementralizer. The stem was held in compression and about 15 of anteversion as the cement cured.Excess cement was removed with freer elevator A +0 51 mm monopolar head was then hammered onto the stem the hip reduced and stability again noted to be excellent. The wound  is thoroughly irrigated out normal saline solution. Capsular flap and short external rotators repaired back to the intertrochanteric crest a drill holes with a #2 Ethibond suture. The IT band was closed with running #1 Vicryl suture, the subcutaneous tissue with 0 and 2-0 undyed Vicryl suture and the skin with running interlocking 3-0 nylon suture. A dressing of Mepilex was then applied. The  patient was unclamped rolled supine, awakened extubated and taken to the recovery room without difficulty.

## 2012-04-01 NOTE — Anesthesia Postprocedure Evaluation (Signed)
  Anesthesia Post-op Note  Patient: Rachael [name]  Procedure(s) Performed: Procedure(s) (LRB): ARTHROPLASTY BIPOLAR HIP (Left)  Patient Location: PACU  Anesthesia Type: General  Level of Consciousness: awake and alert   Airway and Oxygen Therapy: Patient Spontanous Breathing  Post-op Pain: mild  Post-op Assessment: Post-op Vital signs reviewed, Patient's Cardiovascular Status Stable, Respiratory Function Stable, Patent Airway and No signs of Nausea or vomiting  Post-op Vital Signs: stable  Complications: No apparent anesthesia complications. Denies complaints.

## 2012-04-02 LAB — BASIC METABOLIC PANEL
BUN: 30 mg/dL — ABNORMAL HIGH (ref 6–23)
Calcium: 7.6 mg/dL — ABNORMAL LOW (ref 8.4–10.5)
GFR calc non Af Amer: 27 mL/min — ABNORMAL LOW (ref >90)
Glucose, Bld: 143 mg/dL — ABNORMAL HIGH (ref 70–99)
Sodium: 135 mEq/L (ref 135–145)

## 2012-04-02 LAB — URINALYSIS, ROUTINE W REFLEX MICROSCOPIC
Glucose, UA: NEGATIVE mg/dL
Ketones, ur: NEGATIVE mg/dL
Leukocytes, UA: NEGATIVE
Nitrite: NEGATIVE
Protein, ur: NEGATIVE mg/dL
Urobilinogen, UA: 0.2 mg/dL (ref 0.0–1.0)

## 2012-04-02 LAB — CBC
HCT: 29.5 % — ABNORMAL LOW (ref 36.0–46.0)
Hemoglobin: 9.8 g/dL — ABNORMAL LOW (ref 12.0–15.0)
MCH: 29.6 pg (ref 26.0–34.0)
MCHC: 33.2 g/dL (ref 30.0–36.0)
RDW: 12.9 % (ref 11.5–15.5)

## 2012-04-02 MED ORDER — ASPIRIN 325 MG PO TBEC: 325.0000 mg | DELAYED_RELEASE_TABLET | Freq: Two times a day (BID) | ORAL | Status: DC

## 2012-04-02 MED ORDER — HYDROCODONE-ACETAMINOPHEN 5-325 MG PO TABS: 1.0000 | ORAL_TABLET | ORAL | Status: DC | PRN

## 2012-04-02 MED ORDER — BISACODYL 5 MG PO TBEC: 5.0000 mg | DELAYED_RELEASE_TABLET | Freq: Every day | ORAL | Status: DC | PRN

## 2012-04-02 MED ORDER — SODIUM CHLORIDE 0.9 % IV BOLUS (SEPSIS)
250.0000 mL | Freq: Once | INTRAVENOUS | Status: AC
  Administered 2012-04-02: 250 mL via INTRAVENOUS

## 2012-04-02 NOTE — Progress Notes (Signed)
Patient states she has increased "bloated" and "tightness" in her belly. She states she does not know whether it is from gas or urine. While waiting on NP to return page regarding I&O cath, patient was able to pass gas and afterwards stated relief. Once again, bladder scan from 2153= 71 mL. Pt states she would like to wait to perform I&O cath, and that the pain may have been GI/gas related. Nurse educated patient again about the effects of anesthesia and pain meds in production of constipation, as well as the importance of increased water intake to produce urine. Patient's IVF continue to run at 75 mL/hour. Will continue to closely monitor, and RN instructed patient to call if feelings of pain or discomfort presented again. Marytza Grandpre Lynder Parents, RN 10:51 PM 04/02/2012

## 2012-04-02 NOTE — Progress Notes (Signed)
Patient states no urge to void, no bladder pain, fullness, or discomfort. Bladder scan performed by RN. Showed 45-71 mL on multiple scans. RN encouraged patient to increase PO water intake and will closely monitor. Chapman Matteucci Lynder Parents, RN 9:55 PM 04/02/2012

## 2012-04-02 NOTE — Evaluation (Signed)
Physical Therapy Evaluation Patient Details Name: Rachael [name] MRN: [id number] DOB: [birth year] Today's Date: 04/02/2012 Time: 4098-1191 PT Time Calculation (min): 24 min  PT Assessment / Plan / Recommendation Clinical Impression  s/p left hip hemiarthroplasty, post op day # 1 today and will benefit from PT to maximize independence for next venue; will most likely need STSNF    PT Assessment  Patient needs continued PT services    Follow Up Recommendations  SNF;CIR    Does the patient have the potential to tolerate intense rehabilitation      Barriers to Discharge        Equipment Recommendations  Rolling walker with 5" wheels    Recommendations for Other Services     Frequency Min 5X/week    Precautions / Restrictions Precautions Precautions: Posterior Hip Precaution Comments: sign in room Restrictions Weight Bearing Restrictions: Yes LLE Weight Bearing: Weight bearing as tolerated   Pertinent Vitals/Pain      Mobility  Bed Mobility Bed Mobility: Supine to Sit Supine to Sit: 1: +2 Total assist Supine to Sit: Patient Percentage: 30% Details for Bed Mobility Assistance: assist with trunk and LEs, scooting to EOB with use of bed pad;  Transfers Transfers: Sit to Stand;Stand to Sit Sit to Stand: 1: +2 Total assist;From bed Sit to Stand: Patient Percentage: 40% Stand to Sit: 1: +2 Total assist Stand to Sit: Patient Percentage: 30% Details for Transfer Assistance: cues for hand placement and wt shift; +2 for balance and safety Ambulation/Gait Ambulation/Gait Assistance: 1: +2 Total assist Ambulation/Gait: Patient Percentage: 40% Ambulation Distance (Feet): 5 Feet Assistive device: Rolling walker Ambulation/Gait Assistance Details: +2 for balance, multi-modla cues for RW safety and sequence Gait Pattern: Step-to pattern;Trunk flexed;Antalgic Gait velocity: decreased      Shoulder Instructions     Exercises General Exercises - Lower Extremity Ankle  Circles/Pumps: AROM;Both;5 reps Heel Slides: AAROM;Left;5 reps   PT Diagnosis: Difficulty walking  PT Problem List: Decreased strength;Decreased activity tolerance;Decreased balance;Decreased mobility;Decreased safety awareness;Decreased knowledge of use of DME;Decreased knowledge of precautions PT Treatment Interventions: DME instruction;Gait training;Functional mobility training;Therapeutic activities;Therapeutic exercise;Balance training;Patient/family education   PT Goals Acute Rehab PT Goals PT Goal Formulation: With patient Time For Goal Achievement: 04/09/12 Potential to Achieve Goals: Good Pt will go Supine/Side to Sit: with min assist PT Goal: Supine/Side to Sit - Progress: Goal set today Pt will go Sit to Supine/Side: with min assist PT Goal: Sit to Supine/Side - Progress: Goal set today Pt will go Sit to Stand: with min assist PT Goal: Sit to Stand - Progress: Goal set today Pt will go Stand to Sit: with min assist PT Goal: Stand to Sit - Progress: Goal set today Pt will Ambulate: 51 - 150 feet;with min assist;with rolling walker PT Goal: Ambulate - Progress: Goal set today Pt will Perform Home Exercise Program: with supervision, verbal cues required/provided PT Goal: Perform Home Exercise Program - Progress: Goal set today  Visit Information  Last PT Received On: 04/02/12 Assistance Needed: +2    Subjective Data  Subjective: I am glad to see you. Patient Stated Goal: walk   Prior Functioning  Home Living Lives With: Alone Available Help at Discharge: Skilled Nursing Facility Type of Home:  (Townhouse) Home Access: Level entry Home Layout: One level Home Adaptive Equipment: Bedside commode/3-in-1 Additional Comments: pt thinks she has a walker; transport chair; DME belonged to her husband Prior Function Level of Independence: Independent Driving: Yes Communication Communication: No difficulties    Cognition  Overall Cognitive Status:  Appears within  functional limits for tasks assessed/performed Arousal/Alertness: Awake/alert Orientation Level: Appears intact for tasks assessed Behavior During Session: [facility] for tasks performed    Extremity/Trunk Assessment Right Upper Extremity Assessment RUE ROM/Strength/Tone: [facility] for tasks assessed Left Upper Extremity Assessment LUE ROM/Strength/Tone: [facility] for tasks assessed Right Lower Extremity Assessment RLE ROM/Strength/Tone: [facility] for tasks assessed Left Lower Extremity Assessment LLE ROM/Strength/Tone: Deficits;Unable to fully assess;Due to pain LLE ROM/Strength/Tone Deficits: AROM ankle WFL; AAROM knee and hip flexion within limits of pain   Balance    End of Session PT - End of Session Activity Tolerance: Patient limited by fatigue;Treatment limited secondary to medical complications (Comment) (nausea) Patient left: in chair;with call bell/phone within reach;with nursing in room Nurse Communication: Mobility status  GP     [facility] 04/02/2012, 11:10 AM

## 2012-04-02 NOTE — Progress Notes (Signed)
Pt still has no void & feels no urge to void at this time as stated. Hospitalist paged & notified of it,with order to I/O cath now & send for u/a c&s; call back if i/o cath result is about =100's( for bolus orders) but if >100 then will monitor if urine output picks up.

## 2012-04-02 NOTE — Clinical Social Work Note (Signed)
Clinical Social Work Department BRIEF PSYCHOSOCIAL ASSESSMENT 04/02/2012  Patient:  Rachael [name], MILLENDER     Account Number:  [id number]     Admit date:  03/31/2012  Clinical Social Worker:  Lana Fish  Date/Time:  04/02/2012 09:06 AM  Referred by:  Physician  Date Referred:  04/01/2012 Referred for SNF Placement  Other Referral:   Interview type:  Patient Other interview type:    PSYCHOSOCIAL DATA Living Status:  ALONE Admitted from facility:   Level of care:   Primary support name:  Rachael [name] & Rachael [name] Primary support relationship to patient:  FAMILY Degree of support available:   Limited support available   CURRENT CONCERNS Current Concerns Post-Acute Placement  Other Concerns:   Pt is a 21 YOF who presented to [facility] due to a unexpected fall after tripping over a manhole in her neighborhood. Patient currently lives alone within a townhome independently. Patient is agreeable with SNF placement and has a preference of [address] and [facility].   SOCIAL WORK ASSESSMENT / PLAN CSW will assist with SNF placement and provide ongoing assessment of additional needs as they arise.  Assessment/plan status:  Psychosocial Support/Ongoing Assessment of Needs Other assessment/ plan:   Information/referral to community resources:    PATIENT'S/FAMILY'S RESPONSE TO PLAN OF CARE: Pt is agreeable to SNF placement at this time.   Rachael [name]., MSW, [facility] Clinical Social Worker [phone]

## 2012-04-02 NOTE — Progress Notes (Signed)
Pt has not voided since F/C d/c'd this am. Encouraged & offered po fluids. Pt gotten up to [facility] with 2 person assist & turned on faucet to stimulate voiding but to no avail. Pt states no urge to void yet and denies of discomfort at this time. Bladder scanned= 101cc. Assisted pt back to bed and will continue to monitor. Family at bedside & was aware of situation.

## 2012-04-02 NOTE — Progress Notes (Signed)
[facility] PROGRESS NOTE  Rachael [name] NWG:[id number] DOB: January 27, 1924 DOA: 2012-04-12 PCP: No primary provider on file.  Assessment/Plan: Fracture of femoral neck, left S/p Orif.  Doing good post op.    HYPOTHYROIDISM Continue synthroid  HYPERTENSION Home meds restarted.    Code Status: Full Code Family Communication: Discussed with patient Disposition Plan: in 2-3 days [name]  MD  [facility] Pager [phone]  If 7PM-7AM, please contact night-coverage [email or web address] Password [email or web address] 04/01/2012, 12:00 PM   LOS: 1 day   Brief narrative: None  Consultants:  Ortho  Procedures: Femoral neck Fx surg 11/9  Antibiotics:  none ?  HPI/Subjective: Patient complains of Nausea.  Objective: Filed Vitals:   04/01/12 0600 04/01/12 1000 04/01/12 1046 04/01/12 1047  BP: 151/91 148/62 147/63   Pulse: 72 64 65   Temp: 98.5 F (36.9 C)  98.4 F (36.9 C)   TempSrc: Oral  Oral   Resp: 16  16   Height:      Weight:      SpO2: 91%  87% 93%    Intake/Output Summary (Last 24 hours) at 04/01/12 1200 Last data filed at 04/01/12 0115  Gross per 24 hour  Intake      0 ml  Output    600 ml  Net   -600 ml    Exam:   General: Patient is lying in bed. She does not seem to be in any acute distress.   Cardiovascular: Regular rate rhythm  ABD: Soft nontender nondistended positive bowel  Respiratory: Clear to auscultation bilaterally  Ext: No edema  Data Reviewed: Basic Metabolic Panel:  Lab 04/01/12 2952 April 12, 2012 2045  NA 138 137  K 4.2 3.7  CL 105 101  CO2 25 23  GLUCOSE 118* 105*  BUN 33* 36*  CREATININE 1.65* 1.73*  CALCIUM 8.0* 8.7  MG -- --  PHOS -- --   Liver Function Tests:  Lab 04-12-2012 2045  AST 34  ALT 30  ALKPHOS 73  BILITOT 0.6  PROT 6.6  ALBUMIN 3.9   CBC:  Lab 04/01/12 0520 04-12-12 2045  WBC 10.5 14.0*  NEUTROABS -- --  HGB 10.5* 11.4*  HCT 31.8* 34.5*  MCV 89.1 87.8  PLT 184 228    Recent Results  (from the past 240 hour(s))  SURGICAL PCR SCREEN     Status: Abnormal   Collection Time   04/01/12  1:41 AM      Component Value Range Status Comment   MRSA, PCR NEGATIVE  NEGATIVE Final    Staphylococcus aureus POSITIVE (*) NEGATIVE Final      Studies: Dg Elbow Complete Left  04-12-12  *RADIOLOGY REPORT*  Clinical Data: Post fall, now with left-sided elbow pain  LEFT ELBOW - COMPLETE 3+ VIEW  Comparison: None.  Findings: No displaced fracture or elbow joint effusion.  Joint spaces appear preserved.  Regional soft tissues are normal.  No radiopaque foreign body.  IMPRESSION: No displaced fracture or elbow joint effusion.   Original Report Authenticated By: Tacey Ruiz, MD    Dg Wrist Complete Left  2012-04-12  *RADIOLOGY REPORT*  Clinical Data: Post fall, now with left sided wrist pain  LEFT WRIST - COMPLETE 3+ VIEW  Comparison: None.  Findings:  No fracture or dislocation.  Regional soft tissues are normal.  No definite displaced of the pronator quadratus fat pad, though the lateral is limited due to obliquity.  Mild degenerative changes of the STT joints of the base of the thumb.  IMPRESSION:  No fracture.   Original Report Authenticated By: Tacey Ruiz, MD    Dg Femur Left  03/31/2012  *RADIOLOGY REPORT*  Clinical Data: Post fall, now with leg pain  LEFT FEMUR - 2 VIEW  Comparison: None.  Findings:  There is a complete, displaced fracture of the left femoral neck with foreshortening of the fracture fragments and mild varus angulation of the femoral head. No definite intra-articular extension.  No additional fractures are identified.  Scattered vascular calcifications.  Limited visualization of the knees suggests tricompartmental degenerative change.  Mild enthesopathic changes of the patellar tendon insertion site.  IMPRESSION: Complete, displaced fracture of the left femoral neck.   Original Report Authenticated By: Tacey Ruiz, MD    Dg Chest Portable 1 View  03/31/2012  *RADIOLOGY  REPORT*  Clinical Data: Preoperative chest radiograph.  PORTABLE CHEST - 1 VIEW  Comparison: Chest radiograph performed 01/03/2010  Findings: The lungs are well-aerated.  Vascular congestion may be transient in nature.  No significant pulmonary edema is seen.  No pleural effusion or pneumothorax identified.  Focal density at the left lung base appears to reflect the overlying nipple shadow and previously noted chronic scarring.  The cardiomediastinal silhouette is within normal limits.  No acute osseous abnormalities are seen.  IMPRESSION: Vascular congestion may reflect the patient's recent fall; lungs remain clear.  No displaced rib fractures seen.   Original Report Authenticated By: Tonia Ghent, M.D.    Dg Hand Complete Left  03/31/2012  *RADIOLOGY REPORT*  Clinical Data: Left hand pain post fall  LEFT HAND - COMPLETE 3+ VIEW  Comparison: None.  Findings: Three views of the left hand submitted.  There is diffuse osteopenia.  No acute fracture or subluxation.  Degenerative changes noted distal interphalangeal joint second third fourth and fifth finger.  Degenerative changes proximal interphalangeal joint fifth finger.  Mild degenerative changes first carpal metacarpal joint.  IMPRESSION: No acute fracture or subluxation.  Diffuse osteopenia. Degenerative changes as described above.   Original Report Authenticated By: Natasha Mead, M.D.     Scheduled Meds:   . [COMPLETED] fentaNYL  50 mcg Intravenous Once  . [COMPLETED]  HYDROmorphone (DILAUDID) injection  0.5 mg Intravenous Once  . metoprolol  5 mg Intravenous Q8H  . [COMPLETED] ondansetron (ZOFRAN) IV  4 mg Intravenous Once  . [COMPLETED] sodium chloride  500 mL Intravenous Once   Continuous Infusions:   . sodium chloride 75 mL/hr at 04/01/12 0113     Time Spent:25 min

## 2012-04-02 NOTE — Progress Notes (Signed)
PATIENT ID: DEBORRAH MABIN  MRN: [id number]  DOB/AGE:  1923-08-05 / 78 y.o.  1 Day Post-Op Procedure(s) (LRB): ARTHROPLASTY BIPOLAR HIP (Left)    PROGRESS NOTE Subjective: Patient is alert, oriented,complains of Nausea with oxy IR, no Vomiting, yes passing gas, no Bowel Movement. Taking PO well. Denies SOB, Chest or Calf Pain. Using Incentive Spirometer, PAS in place. Patient reports pain as moderate  .    Objective: Vital signs in last 24 hours: Filed Vitals:   04/02/12 0054 04/02/12 0435 04/02/12 0800 04/02/12 1200  BP: 115/62 134/40    Pulse: 66 67    Temp: 98.1 F (36.7 C) 98.3 F (36.8 C)    TempSrc: Oral Oral    Resp: 20 20 20 18   Height:      Weight:      SpO2: 95% 90% 94% 94%      Intake/Output from previous day: I/O last 3 completed shifts: In: 3481.7 [P.O.:240; I.V.:3241.7] Out: 1500 [Urine:1400; Blood:100]   Intake/Output this shift: Total I/O In: 570 [P.O.:120; I.V.:450] Out: 0    LABORATORY DATA:  Basename 04/02/12 0452 04/01/12 0520 03/31/12 2045  WBC 13.6* 10.5 --  HGB 9.8* 10.5* --  HCT 29.5* 31.8* --  PLT 195 184 --  NA 135 138 --  K 4.2 4.2 --  CL 104 105 --  CO2 24 25 --  BUN 30* 33* --  CREATININE 1.65* 1.65* --  GLUCOSE 143* 118* --  GLUCAP -- -- --  INR -- -- 1.09  CALCIUM 7.6* -- --    Examination: Neurologically intact ABD soft Neurovascular intact Sensation intact distally Intact pulses distally Dorsiflexion/Plantar flexion intact Incision: scant drainage} XR AP&Lat of hip shows well placed\fixed THA  Assessment:   1 Day Post-Op Procedure(s) (LRB): ARTHROPLASTY BIPOLAR HIP (Left) ADDITIONAL DIAGNOSIS:  none  Plan: PT/OT WBAT, THA  posterior precautions  DVT Prophylaxis: SCDx72 hrs, ASA 325 mg BID x 2 weeks  DISCHARGE PLAN: Inpatient Rehab if patient qualifies, otherwise SNF  DISCHARGE NEEDS: HHPT, Walker and 3-in-1 comode seat

## 2012-04-03 ENCOUNTER — Encounter (HOSPITAL_COMMUNITY): Payer: Self-pay | Admitting: Orthopedic Surgery

## 2012-04-03 DIAGNOSIS — N179 Acute kidney failure, unspecified: Secondary | ICD-10-CM

## 2012-04-03 LAB — BASIC METABOLIC PANEL
CO2: 22 mEq/L (ref 19–32)
Calcium: 7.3 mg/dL — ABNORMAL LOW (ref 8.4–10.5)
Creatinine, Ser: 1.89 mg/dL — ABNORMAL HIGH (ref 0.50–1.10)
GFR calc Af Amer: 27 mL/min — ABNORMAL LOW (ref >90)
GFR calc non Af Amer: 23 mL/min — ABNORMAL LOW (ref >90)
Sodium: 129 mEq/L — ABNORMAL LOW (ref 135–145)

## 2012-04-03 LAB — CBC
MCH: 29.8 pg (ref 26.0–34.0)
MCV: 88 fL (ref 78.0–100.0)
Platelets: 172 10*3/uL (ref 150–400)
RBC: 3.09 MIL/uL — ABNORMAL LOW (ref 3.87–5.11)
RDW: 13 % (ref 11.5–15.5)

## 2012-04-03 NOTE — Progress Notes (Signed)
Utilization review completed.  

## 2012-04-03 NOTE — Evaluation (Signed)
Occupational Therapy Evaluation Patient Details Name: Rachael [name] MRN: [id number] DOB: 1924/05/21 Today's Date: 04/03/2012 Time: 4098-1191 OT Time Calculation (min): 29 min  OT Assessment / Plan / Recommendation Clinical Impression  Pt doing well POD 2 s/p fem neck fx. Skilled OT indicated to maximize independence to minguard/min A level in prep for d/c to next venue of care.    OT Assessment  Patient needs continued OT Services    Follow Up Recommendations  SNF    Barriers to Discharge Decreased caregiver support    Equipment Recommendations  Rolling walker with 5" wheels    Recommendations for Other Services    Frequency  Min 1X/week    Precautions / Restrictions Precautions Precautions: Posterior Hip Precaution Comments: Pt unable to recall any hip percautions so demonstrated and re educated along with showing her her sign on the wall Restrictions Weight Bearing Restrictions: No LLE Weight Bearing: Weight bearing as tolerated   Pertinent Vitals/Pain Reported 4/10 pain. Repositioned and cold applied    ADL  Grooming: Simulated;Set up Where Assessed - Grooming: Unsupported sitting Upper Body Bathing: Simulated;Set up Where Assessed - Upper Body Bathing: Unsupported sitting Lower Body Bathing: Simulated;Maximal assistance Where Assessed - Lower Body Bathing: Supported sit to stand Upper Body Dressing: Simulated;Set up Where Assessed - Upper Body Dressing: Unsupported sitting Lower Body Dressing: Simulated;Maximal assistance Where Assessed - Lower Body Dressing: Supported sit to stand Toilet Transfer: Simulated;Minimal assistance Toilet Transfer Method: Sit to Barista: Other (comment) Nurse, children's) Toileting - Clothing Manipulation and Hygiene: Simulated;Maximal assistance Where Assessed - Engineer, mining and Hygiene: Standing Equipment Used: Rolling walker Transfers/Ambulation Related to ADLs: Pt ambulated with min A and  max VCs for safety and step sequence. ADL Comments: Pt educated on all hip precautions and provided info on AE.    OT Diagnosis: Generalized weakness  OT Problem List: Decreased activity tolerance;Decreased safety awareness;Decreased knowledge of use of DME or AE;Pain OT Treatment Interventions: Self-care/ADL training;Therapeutic activities;DME and/or AE instruction;Patient/family education   OT Goals Acute Rehab OT Goals OT Goal Formulation: With patient Time For Goal Achievement: 04/10/12 Potential to Achieve Goals: Good ADL Goals Pt Will Perform Grooming: Standing at sink;Other (comment) (w/minguard A) ADL Goal: Grooming - Progress: Goal set today Pt Will Perform Lower Body Bathing: Sit to stand from chair;Sit to stand from bed;with adaptive equipment;with min assist ADL Goal: Lower Body Bathing - Progress: Goal set today Pt Will Perform Lower Body Dressing: with min assist;Sit to stand from bed;Sit to stand from chair;with adaptive equipment ADL Goal: Lower Body Dressing - Progress: Goal set today Pt Will Transfer to Toilet: Ambulation;3-in-1;Other (comment);Maintaining hip precautions (w/minguard A) ADL Goal: Toilet Transfer - Progress: Goal set today Pt Will Perform Toileting - Clothing Manipulation: with min assist;Sitting on 3-in-1 or toilet;Standing ADL Goal: Toileting - Clothing Manipulation - Progress: Goal set today Pt Will Perform Toileting - Hygiene: with min assist;Sit to stand from 3-in-1/toilet ADL Goal: Toileting - Hygiene - Progress: Goal set today  Visit Information  Last OT Received On: 04/03/12 Assistance Needed: +1 PT/OT Co-Evaluation/Treatment: Yes    Subjective Data  Subjective: Maybe if I had a beer I could pee... Patient Stated Goal: Go to [facility] for rehab.   Prior Functioning     Home Living Lives With: Alone Available Help at Discharge: Skilled Nursing Facility Type of Home: House Home Access: Level entry Home Layout: One level Home Adaptive  Equipment: Bedside commode/3-in-1 Prior Function Level of Independence: Independent Able to Take Stairs?: Yes Driving:  Yes Communication Communication: No difficulties Dominant Hand: Right         Vision/Perception     Cognition  Overall Cognitive Status: Appears within functional limits for tasks assessed/performed Arousal/Alertness: Awake/alert Orientation Level: Appears intact for tasks assessed Behavior During Session: [facility] for tasks performed    Extremity/Trunk Assessment Right Upper Extremity Assessment RUE ROM/Strength/Tone: [name] for tasks assessed Left Upper Extremity Assessment LUE ROM/Strength/Tone: WFL for tasks assessed     Mobility Bed Mobility Bed Mobility: Not assessed Details for Bed Mobility Assistance: Pt OOB in recliner Transfers Sit to Stand: 4: Min assist;3: Mod assist;From chair/3-in-1 Stand to Sit: 4: Min assist;3: Mod assist;To chair/3-in-1 Details for Transfer Assistance: 75% VC's on proper technique and how to avoid hip flex >90'.  Pt required increased time and demon mild posterior LOB with initial sit to stand.     Shoulder Instructions     Exercise     Balance     End of Session OT - End of Session Equipment Utilized During Treatment: Gait belt Activity Tolerance: Patient tolerated treatment well Patient left: in chair;with call bell/phone within reach;with family/visitor present  GO     Qadir Folks A OTR/L [phone] 04/03/2012, 3:47 PM

## 2012-04-03 NOTE — Clinical Documentation Improvement (Signed)
GENERIC DOCUMENTATION CLARIFICATION QUERY  THIS DOCUMENT IS NOT A PERMANENT PART OF THE MEDICAL RECORD  TO RESPOND TO THE THIS QUERY, FOLLOW THE INSTRUCTIONS BELOW:  1. If needed, update documentation for the patient's encounter via the notes activity.  2. Access this query again and click edit on the In [employer].  3. After updating, or not, click F2 to complete all highlighted (required) fields concerning your review. Select "additional documentation in the medical record" OR "no additional documentation provided".  4. Click Sign note button.  5. The deficiency will fall out of your In Basket *Please let us know if you are not able to complete this workflow by phone or e-mail (listed below).  Please update your documentation within the medical record to reflect your response to this query.                                                                                        04/03/12   Dear [facility],  In a better effort to capture your patient's severity of illness, reflect appropriate length of stay and utilization of resources, a review of the patient medical record has revealed the following indicators.    Based on your clinical judgment, please clarify and document in a progress note and/or discharge summary the clinical condition associated with the following supporting information:  In responding to this query please exercise your independent judgment.  The fact that a query is asked, does not imply that any particular answer is desired or expected.   Possible Clinical Conditions?  ______Hyponatremia   ______Other Condition  ______Cannot Clinically Determine     Diagnostics: 11/11:  Sodium: 129 11/10:  Sodium: 135  Treatment: 11/10:  0.9% NaCl bolus 11/09: D51/2NS infusion  You may use possible, probable, or suspect with inpatient documentation. possible, probable, suspected diagnoses MUST be documented at the time of discharge  Reviewed:   Additional documentation provided per discharge list 04/04/12.          Thank You,  Marciano Sequin,  Clinical Documentation Specialist:  Pager: [phone]  Health Information Management [place]

## 2012-04-03 NOTE — Progress Notes (Signed)
Physical Therapy Treatment Patient Details Name: Rachael [name] MRN: [id number] DOB: 1923/11/16 Today's Date: 04/03/2012 Time: 4098-1191 PT Time Calculation (min): 25 min  PT Assessment / Plan / Recommendation Comments on Treatment Session  POD # 2 Bopolar L hip 2nd fall/fx.  Pt OOB in recliner.  Pt unaware of her THP so re educated and demonstarted along with showing her handout that was hanging on the wall. Pt lives home alone so plans to D/C to SNF for rehab.    Follow Up Recommendations  CIR;SNF     Does the patient have the potential to tolerate intense rehabilitation     Barriers to Discharge        Equipment Recommendations  Rolling walker with 5" wheels    Recommendations for Other Services    Frequency Min 5X/week   Plan Discharge plan remains appropriate    Precautions / Restrictions Precautions Precautions: Posterior Hip Precaution Comments: Pt unable to recall any hip percautions so demonstrated and re educated along with showing her her sign on the wall Restrictions Weight Bearing Restrictions: No LLE Weight Bearing: Weight bearing as tolerated    Pertinent Vitals/Pain C/o 4/10 L hip pain with activity ICE applied    Mobility  Bed Mobility Bed Mobility: Not assessed Details for Bed Mobility Assistance: Pt OOB in recliner  Transfers Transfers: Sit to Stand;Stand to Sit Sit to Stand: 4: Min assist;3: Mod assist;From chair/3-in-1 Stand to Sit: 4: Min assist;3: Mod assist;To chair/3-in-1 Details for Transfer Assistance: 75% VC's on proper technique and how to avoid hip flex >90'.  Pt required increased time and demon mild posterior LOB with initial sit to stand.  Ambulation/Gait Ambulation/Gait Assistance: 4: Min assist Ambulation Distance (Feet): 28 Feet Assistive device: Rolling walker Ambulation/Gait Assistance Details: 75% VC's on proper sequencing and proper walker to self distance as pt demon mild posterior lean.  Gait Pattern: Step-to  pattern;Trunk flexed;Antalgic Gait velocity: decreased     PT Goals                                                              progressing    Visit Information  Last PT Received On: 04/03/12 Assistance Needed: +1    Subjective Data      Cognition       Balance     End of Session PT - End of Session Equipment Utilized During Treatment: Gait belt Activity Tolerance: Patient limited by fatigue Patient left: in chair;with call bell/phone within reach;with family/visitor present;Other (comment) ( to L hip)   Felecia Shelling  PTA [facility]  Acute  Rehab Pager     [phone]

## 2012-04-03 NOTE — Progress Notes (Signed)
Clinical Social Work Department CLINICAL SOCIAL WORK PLACEMENT NOTE 04/03/2012  Patient:  KESSIAH, MONTELEONE  Account Number:  [id number] Admit date:  03/31/2012  Clinical Social Worker:  Cori Razor, LCSW  Date/time:  04/03/2012 03:00 PM  Clinical Social Work is seeking post-discharge placement for this patient at the following level of care:   SKILLED NURSING   (*CSW will update this form in Epic as items are completed)   04/03/2012  Patient/family provided with [facility] Department of Clinical Social Work's list of facilities offering this level of care within the geographic area requested by the patient (or if unable, by the patient's family).    Patient/family informed of their freedom to choose among providers that offer the needed level of care, that participate in Medicare, Medicaid or managed care program needed by the patient, have an available bed and are willing to accept the patient.    Patient/family informed of [facility]' ownership interest in [facility], as well as of the fact that they are under no obligation to receive care at this facility.  PASARR submitted to EDS on 04/03/2012 PASARR number received from EDS on 04/03/2012  FL2 transmitted to all facilities in geographic area requested by pt/family on  04/03/2012 FL2 transmitted to all facilities within larger geographic area on   Patient informed that his/her managed care company has contracts with or will negotiate with  certain facilities, including the following:     Patient/family informed of bed offers received:  04/03/2012 Patient chooses bed at [facility] Physician recommends and patient chooses bed at    Patient to be transferred to  on   Patient to be transferred to facility by   The following physician request were entered in Epic:   Additional Comments:  Cori Razor LCSW [phone]

## 2012-04-03 NOTE — Progress Notes (Signed)
PATIENT ID: Rachael [name]  MRN: [id number]  DOB/AGE:  1923-10-16 / 78 y.o.  2 Days Post-Op Procedure(s) (LRB): ARTHROPLASTY BIPOLAR HIP (Left)    PROGRESS NOTE Subjective: Patient is alert, oriented,no Nausea, no Vomiting, yes passing gas, no Bowel Movement. Taking PO well. Denies SOB, Chest or Calf Pain. Using Incentive Spirometer, PAS in place. Patient reports pain as moderate  .    Objective: Vital signs in last 24 hours: Filed Vitals:   04/02/12 1409 04/02/12 1600 04/02/12 2143 04/03/12 0436  BP:   123/42 136/53  Pulse:   64 64  Temp:   98.2 F (36.8 C) 99.3 F (37.4 C)  TempSrc:   Oral Oral  Resp:  16 18 20   Height:      Weight:      SpO2: 97% 97% 97% 98%      Intake/Output from previous day: I/O last 3 completed shifts: In: 3542.5 [P.O.:480; I.V.:2812.5; IV Piggyback:250] Out: 1200 [Urine:1200]   Intake/Output this shift:     LABORATORY DATA:  Basename 04/03/12 0437 04/02/12 0452 03/31/12 2045  WBC 11.4* 13.6* --  HGB 9.2* 9.8* --  HCT 27.2* 29.5* --  PLT 172 195 --  NA 129* 135 --  K 4.2 4.2 --  CL 100 104 --  CO2 22 24 --  BUN 33* 30* --  CREATININE 1.89* 1.65* --  GLUCOSE 143* 143* --  GLUCAP -- -- --  INR -- -- 1.09  CALCIUM 7.3* -- --    Examination: Neurologically intact ABD soft Neurovascular intact Sensation intact distally Intact pulses distally Dorsiflexion/Plantar flexion intact Incision: scant drainage} XR AP&Lat of hip shows well placed\fixed THA  Assessment:   2 Days Post-Op Procedure(s) (LRB): ARTHROPLASTY BIPOLAR HIP (Left) ADDITIONAL DIAGNOSIS:  none  Plan: PT/OT WBAT, THA  posterior precautions  DVT Prophylaxis: SCDx72 hrs, ASA 325 mg BID x 2 weeks  DISCHARGE PLAN: Skilled Nursing Facility/Rehab or inpatient rehab when stable per medicine service.  DISCHARGE NEEDS: HHPT, Walker and 3-in-1 comode seat

## 2012-04-03 NOTE — Progress Notes (Signed)
Patient called nurses station to request bedpan. Attempted void, with no results. RN bladder scanned patient for 377 mL. I&O cath performed and 400 mL collected. Patient states relief. Will continue to monitor closely. Amyre Segundo Lynder Parents, RN 3:58 AM 04/03/2012

## 2012-04-03 NOTE — Progress Notes (Signed)
CSW assisting with d/c planning. Pt has chosen [facility] for [employer]. SNF will have a bed Tuesday if pt is ready for d/c. CSW will assist with d/c planning to SNF.  Cori Razor LCSW [phone]

## 2012-04-03 NOTE — Progress Notes (Signed)
[facility] PROGRESS NOTE  Rachael [name] AOZ:[id number] DOB: 1923/11/13 DOA: 01-Apr-2012 PCP: No primary provider on file.  Assessment/Plan: Fracture of femoral neck, left S/p Orif.  Doing good post op.    HYPOTHYROIDISM Continue synthroid  HYPERTENSION Patient's creatinine is elevated. Will hold Bumex and Cozaar for now.    Acute renal failure Patient's creatinine is increasing. Continue IV fluids and hold Bumex and Cozaar. Recheck creatinine in the morning. Patient does have problems voiding.  when necessary cath PRN.  It should be okay for patient to go to short-term skilled nursing facility once she is voiding and creatinine starts trending down.   Code Status: Full Code Family Communication: Discussed with patient Disposition Plan: Tomorrow  [name]  MD  [facility] Pager [phone]  If 7PM-7AM, please contact night-coverage [email or web address] Password [email or web address] 04/01/2012, 12:00 PM   LOS: 1 day   Brief narrative: None  Consultants:  Ortho  Procedures: Femoral neck Fx surg 11/9  Antibiotics:  none ?  HPI/Subjective: Patient feels a little better today . Objective: Filed Vitals:   04/01/12 0600 04/01/12 1000 04/01/12 1046 04/01/12 1047  BP: 151/91 148/62 147/63   Pulse: 72 64 65   Temp: 98.5 F (36.9 C)  98.4 F (36.9 C)   TempSrc: Oral  Oral   Resp: 16  16   Height:      Weight:      SpO2: 91%  87% 93%    Intake/Output Summary (Last 24 hours) at 04/01/12 1200 Last data filed at 04/01/12 0115  Gross per 24 hour  Intake      0 ml  Output    600 ml  Net   -600 ml    Exam:   General: Patient is lying in bed. She does not seem to be in any acute distress.   Cardiovascular: Regular rate rhythm  ABD: Soft nontender nondistended positive bowel  Respiratory: Clear to auscultation bilaterally  Ext: No edema  Data Reviewed: Basic Metabolic Panel:  Lab 04/01/12 4132 April 01, 2012 2045  NA 138 137  K 4.2 3.7  CL 105 101  CO2 25 23   GLUCOSE 118* 105*  BUN 33* 36*  CREATININE 1.65* 1.73*  CALCIUM 8.0* 8.7  MG -- --  PHOS -- --   Liver Function Tests:  Lab 04/01/12 2045  AST 34  ALT 30  ALKPHOS 73  BILITOT 0.6  PROT 6.6  ALBUMIN 3.9   CBC:  Lab 04/01/12 0520 04/01/2012 2045  WBC 10.5 14.0*  NEUTROABS -- --  HGB 10.5* 11.4*  HCT 31.8* 34.5*  MCV 89.1 87.8  PLT 184 228    Recent Results (from the past 240 hour(s))  SURGICAL PCR SCREEN     Status: Abnormal   Collection Time   04/01/12  1:41 AM      Component Value Range Status Comment   MRSA, PCR NEGATIVE  NEGATIVE Final    Staphylococcus aureus POSITIVE (*) NEGATIVE Final      Studies: Dg Elbow Complete Left  Apr 01, 2012  *RADIOLOGY REPORT*  Clinical Data: Post fall, now with left-sided elbow pain  LEFT ELBOW - COMPLETE 3+ VIEW  Comparison: None.  Findings: No displaced fracture or elbow joint effusion.  Joint spaces appear preserved.  Regional soft tissues are normal.  No radiopaque foreign body.  IMPRESSION: No displaced fracture or elbow joint effusion.   Original Report Authenticated By: Tacey Ruiz, MD    Dg Wrist Complete Left  2012/04/01  *RADIOLOGY REPORT*  Clinical Data: Post  fall, now with left sided wrist pain  LEFT WRIST - COMPLETE 3+ VIEW  Comparison: None.  Findings:  No fracture or dislocation.  Regional soft tissues are normal.  No definite displaced of the pronator quadratus fat pad, though the lateral is limited due to obliquity.  Mild degenerative changes of the STT joints of the base of the thumb.  IMPRESSION: No fracture.   Original Report Authenticated By: Tacey Ruiz, MD    Dg Femur Left  03/31/2012  *RADIOLOGY REPORT*  Clinical Data: Post fall, now with leg pain  LEFT FEMUR - 2 VIEW  Comparison: None.  Findings:  There is a complete, displaced fracture of the left femoral neck with foreshortening of the fracture fragments and mild varus angulation of the femoral head. No definite intra-articular extension.  No additional fractures  are identified.  Scattered vascular calcifications.  Limited visualization of the knees suggests tricompartmental degenerative change.  Mild enthesopathic changes of the patellar tendon insertion site.  IMPRESSION: Complete, displaced fracture of the left femoral neck.   Original Report Authenticated By: Tacey Ruiz, MD    Dg Chest Portable 1 View  03/31/2012  *RADIOLOGY REPORT*  Clinical Data: Preoperative chest radiograph.  PORTABLE CHEST - 1 VIEW  Comparison: Chest radiograph performed 01/03/2010  Findings: The lungs are well-aerated.  Vascular congestion may be transient in nature.  No significant pulmonary edema is seen.  No pleural effusion or pneumothorax identified.  Focal density at the left lung base appears to reflect the overlying nipple shadow and previously noted chronic scarring.  The cardiomediastinal silhouette is within normal limits.  No acute osseous abnormalities are seen.  IMPRESSION: Vascular congestion may reflect the patient's recent fall; lungs remain clear.  No displaced rib fractures seen.   Original Report Authenticated By: Tonia Ghent, M.D.    Dg Hand Complete Left  03/31/2012  *RADIOLOGY REPORT*  Clinical Data: Left hand pain post fall  LEFT HAND - COMPLETE 3+ VIEW  Comparison: None.  Findings: Three views of the left hand submitted.  There is diffuse osteopenia.  No acute fracture or subluxation.  Degenerative changes noted distal interphalangeal joint second third fourth and fifth finger.  Degenerative changes proximal interphalangeal joint fifth finger.  Mild degenerative changes first carpal metacarpal joint.  IMPRESSION: No acute fracture or subluxation.  Diffuse osteopenia. Degenerative changes as described above.   Original Report Authenticated By: Natasha Mead, M.D.     Scheduled Meds:   . [COMPLETED] fentaNYL  50 mcg Intravenous Once  . [COMPLETED]  HYDROmorphone (DILAUDID) injection  0.5 mg Intravenous Once  . metoprolol  5 mg Intravenous Q8H  . [COMPLETED]  ondansetron (ZOFRAN) IV  4 mg Intravenous Once  . [COMPLETED] sodium chloride  500 mL Intravenous Once   Continuous Infusions:   . sodium chloride 75 mL/hr at 04/01/12 0113     Time Spent:25 min

## 2012-04-03 NOTE — Progress Notes (Signed)
Rehab Admissions Coordinator Note:  Patient was screened by Trish Mage for appropriateness for an Inpatient Acute Rehab Consult.  At this time, we are recommending Skilled Nursing Facility.  Patient lives alone.  If she has support after discharge, then could consider inpatient rehab consult.  Call me for questions.  Trish Mage 04/03/2012, 9:20 AM  I can be reached at [phone].

## 2012-04-04 DIAGNOSIS — E871 Hypo-osmolality and hyponatremia: Secondary | ICD-10-CM

## 2012-04-04 LAB — CBC
HCT: 28.1 % — ABNORMAL LOW (ref 36.0–46.0)
Hemoglobin: 9.4 g/dL — ABNORMAL LOW (ref 12.0–15.0)
MCH: 29 pg (ref 26.0–34.0)
MCV: 86.7 fL (ref 78.0–100.0)
Platelets: 215 10*3/uL (ref 150–400)
RBC: 3.24 MIL/uL — ABNORMAL LOW (ref 3.87–5.11)
WBC: 10.7 10*3/uL — ABNORMAL HIGH (ref 4.0–10.5)

## 2012-04-04 LAB — BASIC METABOLIC PANEL
CO2: 22 mEq/L (ref 19–32)
Calcium: 7.7 mg/dL — ABNORMAL LOW (ref 8.4–10.5)
Chloride: 95 mEq/L — ABNORMAL LOW (ref 96–112)
Creatinine, Ser: 1.57 mg/dL — ABNORMAL HIGH (ref 0.50–1.10)
Glucose, Bld: 136 mg/dL — ABNORMAL HIGH (ref 70–99)

## 2012-04-04 LAB — URINE CULTURE: Colony Count: NO GROWTH

## 2012-04-04 MED ORDER — DSS 100 MG PO CAPS: 100.0000 mg | ORAL_CAPSULE | Freq: Two times a day (BID) | ORAL | Status: DC

## 2012-04-04 MED ORDER — BISOPROLOL FUMARATE 10 MG PO TABS: 10.0000 mg | ORAL_TABLET | Freq: Every day | ORAL | Status: DC

## 2012-04-04 NOTE — Progress Notes (Signed)
Patient ID: YAMARI VENTOLA, female   DOB: 1923-11-22, 78 y.o.   MRN: [id number] PATIENT ID: KEYANNI WHITTINGHILL  MRN: [id number]  DOB/AGE:  1923-11-20 / 78 y.o.  3 Days Post-Op Procedure(s) (LRB): ARTHROPLASTY BIPOLAR HIP (Left)    PROGRESS NOTE Subjective: Patient is alert, oriented,no Nausea, no Vomiting, yes passing gas, Denies SOB, Chest or Calf Pain. Using Incentive Spirometer, PAS in place. Ambulate in room rolling walker Patient reports pain as 3 on 0-10 scale  .    Objective: Vital signs in last 24 hours: Filed Vitals:   04/03/12 2040 04/04/12 0024 04/04/12 0438 04/04/12 0636  BP: 112/64   170/50  Pulse: 72   69  Temp: 97.6 F (36.4 C)   97.7 F (36.5 C)  TempSrc:      Resp: 16 16 14 16   Height:      Weight:      SpO2: 91% 93% 91% 96%      Intake/Output from previous day: I/O last 3 completed shifts: In: 3787.5 [P.O.:1020; I.V.:2767.5] Out: 1480 [Urine:1480]   Intake/Output this shift: Total I/O In: -  Out: 275 [Urine:275]   LABORATORY DATA:  Basename 04/04/12 0424 04/03/12 0437  WBC 10.7* 11.4*  HGB 9.4* 9.2*  HCT 28.1* 27.2*  PLT 215 172  NA 127* 129*  K 4.2 4.2  CL 95* 100  CO2 22 22  BUN 31* 33*  CREATININE 1.57* 1.89*  GLUCOSE 136* 143*  GLUCAP -- --  INR -- --  CALCIUM 7.7* --    Examination: Neurologically intact ABD soft Neurovascular intact Sensation intact distally Intact pulses distally Dorsiflexion/Plantar flexion intact Incision: no drainage No cellulitis present Compartment soft} XR AP&Lat of hip shows well placed\fixed THA  Assessment:   3 Days Post-Op Procedure(s) (LRB): ARTHROPLASTY BIPOLAR HIP (Left) ADDITIONAL DIAGNOSIS:    Plan: PT/OT WBAT, THA  posterior precautions  DVT Prophylaxis: SCDx72 hrs, ASA 325 mg BID x 2 weeks  DISCHARGE PLAN: Skilled Nursing Facility/Rehab, [facility]  DISCHARGE NEEDS: HHPT, HHRN, CPM, Walker and 3-in-1 comode seat, RTC Terrisha Lopata 10-14 days post- op

## 2012-04-04 NOTE — Progress Notes (Signed)
Pt for D/C to SNF when bed available today as ordered. SW processing d/c arrangements & POA(friend) at bedside aware of pts d/c to SNF. IV d/c'd. Dressing dry & intact to L hip. Discharge instructions reviewed with verbalized understanding. Pt continues to be incontinent with bladder at times.

## 2012-04-04 NOTE — Progress Notes (Signed)
Clinical Social Work Department CLINICAL SOCIAL WORK PLACEMENT NOTE 04/04/2012  Patient:  Rachael [name], Rachael [name]  Account Number:  [id number] Admit date:  03/31/2012  Clinical Social Worker:  Cori Razor, LCSW  Date/time:  04/03/2012 03:00 PM  Clinical Social Work is seeking post-discharge placement for this patient at the following level of care:   SKILLED NURSING   (*CSW will update this form in Epic as items are completed)   04/03/2012  Patient/family provided with [facility] Department of Clinical Social Work's list of facilities offering this level of care within the geographic area requested by the patient (or if unable, by the patient's family).    Patient/family informed of their freedom to choose among providers that offer the needed level of care, that participate in Medicare, Medicaid or managed care program needed by the patient, have an available bed and are willing to accept the patient.    Patient/family informed of [facility]' ownership interest in [facility], as well as of the fact that they are under no obligation to receive care at this facility.  PASARR submitted to EDS on 04/03/2012 PASARR number received from EDS on 04/03/2012  FL2 transmitted to all facilities in geographic area requested by pt/family on  04/03/2012 FL2 transmitted to all facilities within larger geographic area on   Patient informed that his/her managed care company has contracts with or will negotiate with  certain facilities, including the following:     Patient/family informed of bed offers received:  04/03/2012 Patient chooses bed at [facility] Physician recommends and patient chooses bed at    Patient to be transferred to [facility] on  04/04/2012 Patient to be transferred to facility by [facility]  The following physician request were entered in Epic:   Additional Comments:  Cori Razor LCSW [phone]

## 2012-04-04 NOTE — Progress Notes (Signed)
Physical Therapy Treatment Patient Details Name: JOELLA SAEFONG MRN: [id number] DOB: [birth year] Today's Date: 04/04/2012 Time: 1010-1036 PT Time Calculation (min): 26 min  PT Assessment / Plan / Recommendation Comments on Treatment Session  POD #3 assisted pt OOB to [facility] to void then to amb in hallway.  Pt requires increased time and MAX VC's to avoid hip flex >90' with sit to stand and stand to sit.  Pt replys, "Oh yea".  Pt plans to D/C to [facility] for ST Rehab.    Follow Up Recommendations  SNF     Does the patient have the potential to tolerate intense rehabilitation     Barriers to Discharge        Equipment Recommendations  Rolling walker with 5" wheels    Recommendations for Other Services    Frequency Min 5X/week   Plan Discharge plan remains appropriate    Precautions / Restrictions Precautions Precaution Comments: Pt requires re education of her THP as she only recalled 1/3 Restrictions Weight Bearing Restrictions: No LLE Weight Bearing: Weight bearing as tolerated    Pertinent Vitals/Pain C/o "soreness" Applied ICE    Mobility  Bed Mobility Bed Mobility: Supine to Sit;Sitting - Scoot to Edge of Bed Supine to Sit: 2: Max assist Sitting - Scoot to [place] of Bed: 2: Max assist Details for Bed Mobility Assistance: increased time and 75% Vc's on proper tech  Transfers Transfers: Sit to Stand;Stand to Sit Sit to Stand: 4: Min assist;3: Mod assist;From bed;From toilet Stand to Sit: 4: Min assist;3: Mod assist;To toilet;To chair/3-in-1 Details for Transfer Assistance: 75% VC's on proper technique and how to avoid hip flex >90'. Pt required increased time and demon mild posterior LOB with initial sit to stand.  Ambulation/Gait Ambulation/Gait Assistance: 4: Min assist Ambulation Distance (Feet): 45 Feet Assistive device: Rolling walker Ambulation/Gait Assistance Details: 50% VC's on proper sequencing and proper walker to self distance as pt demon mild  posterior lean. Gait Pattern: Step-to pattern;Decreased stance time - left;Trunk flexed Gait velocity: decreased General Gait Details: unsteady     PT Goals                                                            progressing    Visit Information  Last PT Received On: 04/04/12 Assistance Needed: +1    Subjective Data      Cognition       Balance     End of Session PT - End of Session Equipment Utilized During Treatment: Gait belt Activity Tolerance: Patient tolerated treatment well Patient left: in chair;with call bell/phone within reach   Felecia Shelling  PTA [facility]  Acute  Rehab Pager     [phone]

## 2012-04-04 NOTE — Care Management Note (Signed)
    Page 1 of 2   04/04/2012     9:05:07 AM   CARE MANAGEMENT NOTE 04/04/2012  Patient:  FAUN, MCQUEEN   Account Number:  [id number]  Date Initiated:  04/04/2012  Documentation initiated by:  Colleen Can  Subjective/Objective Assessment:   DX left femoral neck fracture; left hip hemiarthroplasty     Action/Plan:   Plans are for SNF placemnt   Anticipated DC Date:  04/04/2012   Anticipated DC Plan:  SKILLED NURSING FACILITY  In-house referral  Clinical Social Worker      DC Planning Services  CM consult      [facility] Choice  NA   Choice offered to / List presented to:  NA   DME arranged  NA      DME agency  NA     HH arranged  NA      HH agency  NA   Status of service:  Completed, signed off Medicare Important Message given?   (If response is "NO", the following Medicare IM given date fields will be blank) Date Medicare IM given:   Date Additional Medicare IM given:    Discharge Disposition:    Per UR Regulation:    If discussed at Long Length of Stay Meetings, dates discussed:    Comments:

## 2012-04-04 NOTE — Discharge Summary (Signed)
Physician Discharge Summary  [name]:[id number] DOB: 1923/08/27 DOA: 03/31/2012  PCP: No primary provider on file.  Admit date: 03/31/2012 Discharge date: 04/04/2012  Recommendations for Outpatient Follow-up:    Discharge Diagnoses:  Fracture of femoral neck, left  HYPOTHYROIDISM  HYPERTENSION  Fall from slip, trip, or stumble  Contusion of arm, left  Contusion of nose  Acute renal failure  Hyponatremia  Discharge Condition: Stable Disposition:SNF  Diet recommendation: Heart Healthy Filed Weights   04/01/12 0115  Weight: 62.3 kg (137 lb 5.6 oz)    History of present illness:  Rachael [name] is a 78 y.o. female who suffered a second fall where she tripped over her undone shoe laces and fell onto her left side and had increased left hip and left elbow pain. She also fell 1 day earlier when she was out walking in her neighborhood and tripped over A manhole which was uneven on the pavement. She fell and hit her face and arms first. She denies having any syncope or dizziness or problems with weakness or balance associated with the falls. She also denies having any fevers chills, chest pain or SOB or seizures.  In the ED she was evaluated and found to have an acute fracture of the left femoral neck. Orthopedics was consulted and Dr. Turner Daniels is to see the patient for evaluation for surgical options.    Hospital Course:  Fracture of femoral neck, left  S/p Orif. Doing good post op.   HYPOTHYROIDISM  Continue synthroid, stable this admission   HYPERTENSION  Patient's creatinine was elevated. Her Bumex and Cozaar was on hold. Blood pressure increased. Will resume all her medications for discharge. Blood pressure can be followed at the short-term skilled nursing facility. Patient is without any chest pain headache or shortness of breath..   Acute renal failure  Patient's creatinine increased. This was most likely secondary to volume status. She was given IV fluids. Her  Cozaar and Bumex were placed on hold. Creatinine now is trending down. BMP can be done at the skilled nursing facility within a week   Hyponatremia Patient's sodium also decreased. Patient was on D5 half-normal saline. A decrease in sodium is most likely secondary to the half-normal saline. BMP can be rechecked at the facility.  Patient was also getting hydrochlorothiazide inpatient. Will hold the hydrochlorothiazide for another few days. If her sodium is back to normal then the hydrochlorothiazide part of her medication can be restarted at that time.   DVT prophylaxis Patient was placed on aspirin 325 mg daily for 2 weeks for DVT prophylaxis per orthopedics. Will hold her aspirin 81 mg daily until this is discontinued. Discharge Instructions  Discharge Orders    Future Orders Please Complete By Expires   Diet - low sodium heart healthy      Diet - low sodium heart healthy      Weight bearing as tolerated      Increase activity slowly      Walker       May shower / Bathe      Driving Restrictions      Comments:   No driving for 2 weeks.   Change dressing (specify)      Comments:   Dressing change as needed.   Call MD for:  temperature >100.4      Call MD for:  severe uncontrolled pain      Call MD for:  redness, tenderness, or signs of infection (pain, swelling, redness, odor or green/yellow discharge  around incision site)      Discharge instructions      Comments:   F/U with Dr. Turner Daniels for suture removal and xrays (POD #14)   Increase activity slowly          Medication List     As of 04/04/2012 10:34 AM    STOP taking these medications         aspirin 81 MG chewable tablet      TAKE these medications         alendronate 70 MG tablet   Commonly known as: FOSAMAX   Take 70 mg by mouth every 7 (seven) days. Take with a full glass of water on an empty stomach.      aspirin 325 MG EC tablet   Take 1 tablet (325 mg total) by mouth 2 (two) times daily.      BENICAR PO     Take by mouth daily. 1/4 tablet daily.      Biotin 1000 MCG tablet   Take 1,000 mcg by mouth daily.      bisacodyl 5 MG EC tablet   Commonly known as: DULCOLAX   Take 1 tablet (5 mg total) by mouth daily as needed.      Bisoprolol 10 MG per tablet      Take 1 tablet by mouth daily.      bumetanide 1 MG tablet   Commonly known as: BUMEX   Take 1 mg by mouth daily. Take 1/2 tablet every other day on Monday, Wednesday, Friday and Sunday.      CALCIUM PO   Take 2 capsules by mouth daily.      DSS 100 MG Caps   Take 100 mg by mouth 2 (two) times daily.      HYDROcodone-acetaminophen 5-325 MG per tablet   Commonly known as: NORCO/VICODIN   Take 1-2 tablets by mouth every 4 (four) hours as needed.      levothyroxine 125 MCG tablet   Commonly known as: SYNTHROID, LEVOTHROID   Take 125 mcg by mouth daily.      multivitamin with minerals Tabs   Take 1 tablet by mouth daily.      RED YEAST RICE PO   Take 1 capsule by mouth daily.      solifenacin 10 MG tablet   Commonly known as: VESICARE   Take 2.5 mg by mouth as needed. Bladder issues.      zinc gluconate 50 MG tablet   Take 50 mg by mouth daily.          The results of significant diagnostics from this hospitalization (including imaging, microbiology, ancillary and laboratory) are listed below for reference.    Significant Diagnostic Studies: Dg Elbow Complete Left  04-03-12  *RADIOLOGY REPORT*  Clinical Data: Post fall, now with left-sided elbow pain  LEFT ELBOW - COMPLETE 3+ VIEW  Comparison: None.  Findings: No displaced fracture or elbow joint effusion.  Joint spaces appear preserved.  Regional soft tissues are normal.  No radiopaque foreign body.  IMPRESSION: No displaced fracture or elbow joint effusion.   Original Report Authenticated By: Tacey Ruiz, MD    Dg Wrist Complete Left  04/03/12  *RADIOLOGY REPORT*  Clinical Data: Post fall, now with left sided wrist pain  LEFT WRIST - COMPLETE 3+ VIEW   Comparison: None.  Findings:  No fracture or dislocation.  Regional soft tissues are normal.  No definite displaced of the pronator quadratus fat pad, though the lateral is  limited due to obliquity.  Mild degenerative changes of the STT joints of the base of the thumb.  IMPRESSION: No fracture.   Original Report Authenticated By: Tacey Ruiz, MD    Dg Femur Left  03/31/2012  *RADIOLOGY REPORT*  Clinical Data: Post fall, now with leg pain  LEFT FEMUR - 2 VIEW  Comparison: None.  Findings:  There is a complete, displaced fracture of the left femoral neck with foreshortening of the fracture fragments and mild varus angulation of the femoral head. No definite intra-articular extension.  No additional fractures are identified.  Scattered vascular calcifications.  Limited visualization of the knees suggests tricompartmental degenerative change.  Mild enthesopathic changes of the patellar tendon insertion site.  IMPRESSION: Complete, displaced fracture of the left femoral neck.   Original Report Authenticated By: Tacey Ruiz, MD    Dg Pelvis Portable  04/01/2012  *RADIOLOGY REPORT*  Clinical Data: Left hip arthroplasty.  PORTABLE PELVIS  Comparison: None  Findings: Bipolar left hip replacement shows a good appearance.  No evidence of fracture or unexpected finding.  IMPRESSION: Good appearance following left hip arthroplasty.   Original Report Authenticated By: Paulina Fusi, M.D.    Dg Chest Portable 1 View  03/31/2012  *RADIOLOGY REPORT*  Clinical Data: Preoperative chest radiograph.  PORTABLE CHEST - 1 VIEW  Comparison: Chest radiograph performed 01/03/2010  Findings: The lungs are well-aerated.  Vascular congestion may be transient in nature.  No significant pulmonary edema is seen.  No pleural effusion or pneumothorax identified.  Focal density at the left lung base appears to reflect the overlying nipple shadow and previously noted chronic scarring.  The cardiomediastinal silhouette is within normal limits.   No acute osseous abnormalities are seen.  IMPRESSION: Vascular congestion may reflect the patient's recent fall; lungs remain clear.  No displaced rib fractures seen.   Original Report Authenticated By: Tonia Ghent, M.D.    Dg Hand Complete Left  03/31/2012  *RADIOLOGY REPORT*  Clinical Data: Left hand pain post fall  LEFT HAND - COMPLETE 3+ VIEW  Comparison: None.  Findings: Three views of the left hand submitted.  There is diffuse osteopenia.  No acute fracture or subluxation.  Degenerative changes noted distal interphalangeal joint second third fourth and fifth finger.  Degenerative changes proximal interphalangeal joint fifth finger.  Mild degenerative changes first carpal metacarpal joint.  IMPRESSION: No acute fracture or subluxation.  Diffuse osteopenia. Degenerative changes as described above.   Original Report Authenticated By: Natasha Mead, M.D.     Microbiology: Recent Results (from the past 240 hour(s))  SURGICAL PCR SCREEN     Status: Abnormal   Collection Time   04/01/12  1:41 AM      Component Value Range Status Comment   MRSA, PCR NEGATIVE  NEGATIVE Final    Staphylococcus aureus POSITIVE (*) NEGATIVE Final   URINE CULTURE     Status: Normal   Collection Time   04/02/12  5:17 PM      Component Value Range Status Comment   Specimen Description URINE, CATHETERIZED   Final    Special Requests none Normal   Final    Culture  Setup Time 04/03/2012 09:12   Final    Colony Count NO GROWTH   Final    Culture NO GROWTH   Final    Report Status 04/04/2012 FINAL   Final      Labs: Basic Metabolic Panel:  Lab 04/04/12 1610 04/03/12 0437 04/02/12 0452 04/01/12 0520 03/31/12 2045  NA 127*  129* 135 138 137  K 4.2 4.2 4.2 4.2 3.7  CL 95* 100 104 105 101  CO2 22 22 24 25 23   GLUCOSE 136* 143* 143* 118* 105*  BUN 31* 33* 30* 33* 36*  CREATININE 1.57* 1.89* 1.65* 1.65* 1.73*  CALCIUM 7.7* 7.3* 7.6* 8.0* 8.7  MG -- -- -- -- --  PHOS -- -- -- -- --   Liver Function Tests:  Lab  03/31/12 2045  AST 34  ALT 30  ALKPHOS 73  BILITOT 0.6  PROT 6.6  ALBUMIN 3.9   CBC:  Lab 04/04/12 0424 04/03/12 0437 04/02/12 0452 04/01/12 0520 03/31/12 2045  WBC 10.7* 11.4* 13.6* 10.5 14.0*  NEUTROABS -- -- -- -- --  HGB 9.4* 9.2* 9.8* 10.5* 11.4*  HCT 28.1* 27.2* 29.5* 31.8* 34.5*  MCV 86.7 88.0 89.1 89.1 87.8  PLT 215 172 195 184 228      Time coordinating discharge: 60 min Signed:  [name], MD [facility] 04/04/2012, 10:34 AM

## 2012-05-05 ENCOUNTER — Emergency Department (HOSPITAL_COMMUNITY): Payer: Medicare Other

## 2012-05-05 ENCOUNTER — Encounter (HOSPITAL_COMMUNITY): Payer: Self-pay | Admitting: Certified Registered Nurse Anesthetist

## 2012-05-05 ENCOUNTER — Encounter (HOSPITAL_COMMUNITY): Payer: Self-pay | Admitting: Emergency Medicine

## 2012-05-05 ENCOUNTER — Inpatient Hospital Stay (HOSPITAL_COMMUNITY)
Admission: EM | Admit: 2012-05-05 | Discharge: 2012-05-09 | DRG: 481 | Disposition: A | Discharge status: Skilled Nursing Facility | Payer: Medicare Other | Attending: Emergency Medicine | Admitting: Emergency Medicine

## 2012-05-05 DIAGNOSIS — S0033XA Contusion of nose, initial encounter: Secondary | ICD-10-CM

## 2012-05-05 DIAGNOSIS — N179 Acute kidney failure, unspecified: Secondary | ICD-10-CM

## 2012-05-05 DIAGNOSIS — Z7982 Long term (current) use of aspirin: Secondary | ICD-10-CM

## 2012-05-05 DIAGNOSIS — K5730 Diverticulosis of large intestine without perforation or abscess without bleeding: Secondary | ICD-10-CM

## 2012-05-05 DIAGNOSIS — S72141A Displaced intertrochanteric fracture of right femur, initial encounter for closed fracture: Secondary | ICD-10-CM | POA: Diagnosis present

## 2012-05-05 DIAGNOSIS — S72143A Displaced intertrochanteric fracture of unspecified femur, initial encounter for closed fracture: Principal | ICD-10-CM | POA: Diagnosis present

## 2012-05-05 DIAGNOSIS — R55 Syncope and collapse: Secondary | ICD-10-CM | POA: Diagnosis present

## 2012-05-05 DIAGNOSIS — E039 Hypothyroidism, unspecified: Secondary | ICD-10-CM | POA: Diagnosis present

## 2012-05-05 DIAGNOSIS — S40022A Contusion of left upper arm, initial encounter: Secondary | ICD-10-CM

## 2012-05-05 DIAGNOSIS — S72002A Fracture of unspecified part of neck of left femur, initial encounter for closed fracture: Secondary | ICD-10-CM

## 2012-05-05 DIAGNOSIS — W19XXXA Unspecified fall, initial encounter: Secondary | ICD-10-CM | POA: Diagnosis present

## 2012-05-05 DIAGNOSIS — W010XXA Fall on same level from slipping, tripping and stumbling without subsequent striking against object, initial encounter: Secondary | ICD-10-CM

## 2012-05-05 DIAGNOSIS — I129 Hypertensive chronic kidney disease with stage 1 through stage 4 chronic kidney disease, or unspecified chronic kidney disease: Secondary | ICD-10-CM | POA: Diagnosis present

## 2012-05-05 DIAGNOSIS — I10 Essential (primary) hypertension: Secondary | ICD-10-CM

## 2012-05-05 DIAGNOSIS — I509 Heart failure, unspecified: Secondary | ICD-10-CM | POA: Diagnosis present

## 2012-05-05 DIAGNOSIS — Z96649 Presence of unspecified artificial hip joint: Secondary | ICD-10-CM

## 2012-05-05 DIAGNOSIS — G459 Transient cerebral ischemic attack, unspecified: Secondary | ICD-10-CM

## 2012-05-05 DIAGNOSIS — M810 Age-related osteoporosis without current pathological fracture: Secondary | ICD-10-CM | POA: Diagnosis present

## 2012-05-05 DIAGNOSIS — I498 Other specified cardiac arrhythmias: Secondary | ICD-10-CM

## 2012-05-05 DIAGNOSIS — Z79899 Other long term (current) drug therapy: Secondary | ICD-10-CM

## 2012-05-05 DIAGNOSIS — I5032 Chronic diastolic (congestive) heart failure: Secondary | ICD-10-CM | POA: Diagnosis present

## 2012-05-05 DIAGNOSIS — S72009A Fracture of unspecified part of neck of unspecified femur, initial encounter for closed fracture: Secondary | ICD-10-CM

## 2012-05-05 DIAGNOSIS — E871 Hypo-osmolality and hyponatremia: Secondary | ICD-10-CM

## 2012-05-05 DIAGNOSIS — Z87891 Personal history of nicotine dependence: Secondary | ICD-10-CM

## 2012-05-05 DIAGNOSIS — I2510 Atherosclerotic heart disease of native coronary artery without angina pectoris: Secondary | ICD-10-CM

## 2012-05-05 DIAGNOSIS — N183 Chronic kidney disease, stage 3 unspecified: Secondary | ICD-10-CM | POA: Diagnosis present

## 2012-05-05 LAB — BASIC METABOLIC PANEL
Calcium: 9.1 mg/dL (ref 8.4–10.5)
GFR calc Af Amer: 30 mL/min — ABNORMAL LOW (ref >90)
GFR calc non Af Amer: 26 mL/min — ABNORMAL LOW (ref >90)
Potassium: 4.2 mEq/L (ref 3.5–5.1)
Sodium: 138 mEq/L (ref 135–145)

## 2012-05-05 LAB — PROTIME-INR
INR: 1.15 (ref 0.00–1.49)
Prothrombin Time: 14.5 seconds (ref 11.6–15.2)

## 2012-05-05 LAB — CBC WITH DIFFERENTIAL/PLATELET
Eosinophils Absolute: 0.1 10*3/uL (ref 0.0–0.7)
Hemoglobin: 10.4 g/dL — ABNORMAL LOW (ref 12.0–15.0)
Lymphocytes Relative: 9 % — ABNORMAL LOW (ref 12–46)
Lymphs Abs: 1.2 10*3/uL (ref 0.7–4.0)
Neutro Abs: 11.4 10*3/uL — ABNORMAL HIGH (ref 1.7–7.7)
Neutrophils Relative %: 85 % — ABNORMAL HIGH (ref 43–77)
Platelets: 314 10*3/uL (ref 150–400)
RBC: 3.67 MIL/uL — ABNORMAL LOW (ref 3.87–5.11)
WBC: 13.5 10*3/uL — ABNORMAL HIGH (ref 4.0–10.5)

## 2012-05-05 LAB — TYPE AND SCREEN
ABO/RH(D): O POS
Antibody Screen: NEGATIVE

## 2012-05-05 MED ORDER — ONDANSETRON HCL 4 MG/2ML IJ SOLN
4.0000 mg | Freq: Once | INTRAMUSCULAR | Status: AC
  Administered 2012-05-05: 4 mg via INTRAVENOUS
  Filled 2012-05-05: qty 2

## 2012-05-05 MED ORDER — SODIUM CHLORIDE 0.9 % IV SOLN
1000.0000 mL | INTRAVENOUS | Status: DC
  Administered 2012-05-05: 1000 mL via INTRAVENOUS

## 2012-05-05 MED ORDER — HYDROMORPHONE HCL PF 1 MG/ML IJ SOLN
0.5000 mg | Freq: Once | INTRAMUSCULAR | Status: AC
  Administered 2012-05-05: 0.5 mg via INTRAVENOUS
  Filled 2012-05-05: qty 1

## 2012-05-05 MED ORDER — HYDROMORPHONE HCL PF 1 MG/ML IJ SOLN
1.0000 mg | Freq: Once | INTRAMUSCULAR | Status: AC
  Administered 2012-05-05: 1 mg via INTRAVENOUS
  Filled 2012-05-05: qty 1

## 2012-05-05 NOTE — ED Notes (Signed)
Mack Guise (nephew)- [phone])

## 2012-05-05 NOTE — H&P (Signed)
[facility] History and Physical  Rachael [name] ZOX:[id number] DOB: Oct 25, 1923 DOA: 05/05/2012  Referring physician: Bonnita Levan. Bernette Mayers, MD PCP: Nadean Corwin, MD   Chief Complaint: Right intertrochanteric hip fracture  HPI: Rachael [name] is a 78 y.o. female with fentanyl history of hypertension and osteoporosis. Patient brought to the hospital after she fell and broke her right hip. Please note that patient was discharged from the hospital about 5 weeks ago when she had left hip fracture and she undergone left hip replacement. Patient went to camp [place] place for rehabilitation and she was discharged home and she stayed home for the past 3 weeks. When I interviewed the patient she was lethargic from pain medication but easy to arouse the history was obtained from the records and her nephew who is at bedside. According to her nephew she felt dizzy yesterday but that was resolved. So today she went out and when she came back she went to the bathroom and when she to get up from the commode she felt dizzy and she fell. He couldn't move afterwards she brought to the hospital initial evaluation showed a right hip fracture. Orthopedics was consulted for further surgical management.  Review of Systems:  Review of systems not obtainable because of lethargy  Past Medical History  Diagnosis Date  . Thyroid disease   . Hypertension   . Senile osteoporosis    Past Surgical History  Procedure Date  . Inguinal hernia repair   . Tonsillectomy   . Hip arthroplasty 04/01/2012    Procedure: ARTHROPLASTY BIPOLAR HIP;  Surgeon: [name], MD;  Location: [facility];  Service: Orthopedics;  Laterality: Left;   Social History:  reports that she quit smoking about 41 years ago. She quit smokeless tobacco use about 34 years ago. She reports that she does not drink alcohol or use illicit drugs.  she lives alone at home she was recently in [facility] rehabilitation after her left hip  replacement.   No Known Allergies  Family History  Problem Relation Age of Onset  . CVA Mother     died age 38  . Emphysema Sister     Prior to Admission medications   Medication Sig Start Date End Date Taking? Authorizing Provider  alendronate (FOSAMAX) 70 MG tablet Take 70 mg by mouth every 7 (seven) days. Friday.Take with a full glass of water on an empty stomach.   Yes Historical Provider, MD  aspirin EC 325 MG tablet Take 650 mg by mouth at bedtime.   Yes Historical Provider, MD  Biotin 1000 MCG tablet Take 1,000 mcg by mouth daily.   Yes Historical Provider, MD  bisoprolol (ZEBETA) 10 MG tablet Take 1 tablet (10 mg total) by mouth daily. 04/04/12  Yes Novlet Adelina Mings, MD  bumetanide (BUMEX) 1 MG tablet Take 1 mg by mouth daily. Take 1/2 tablet every other day on Monday, Wednesday, Friday and Sunday.   Yes Historical Provider, MD  levothyroxine (SYNTHROID, LEVOTHROID) 125 MCG tablet Take 187.5 mcg by mouth daily. Take 1 and 1/2 of the 125 mcg tablets   Yes Historical Provider, MD  Multiple Vitamin (MULTIVITAMIN WITH MINERALS) TABS Take 1 tablet by mouth daily.   Yes Historical Provider, MD  Olmesartan Medoxomil (BENICAR PO) Take by mouth daily. 1/4 tablet daily.   Yes Historical Provider, MD  Red Yeast Rice Extract (RED YEAST RICE PO) Take 3 capsules by mouth daily.    Yes Historical Provider, MD  zinc gluconate 50 MG tablet Take  50 mg by mouth daily.   Yes Historical Provider, MD   Physical Exam: Filed Vitals:   05/05/12 1427 05/05/12 1538 05/05/12 1722  BP: 173/62 148/59 167/43  Pulse:  64 84  Temp: 97.5 F (36.4 C)    TempSrc: Oral    Resp: 18    SpO2: 100% 99% 99%   General appearance: Lethargic, easy to arouse  Head: Normocephalic, without obvious abnormality, atraumatic  Eyes: conjunctivae/corneas clear. PERRL, EOM's intact. Fundi benign.  Nose: Nares normal. Septum midline. Mucosa normal. No drainage or sinus tenderness.  Throat: lips, mucosa, and tongue normal;  teeth and gums normal  Neck: Supple, no masses, no cervical lymphadenopathy, no JVD appreciated, no meningeal signs Resp: clear to auscultation bilaterally  Chest wall: no tenderness  Cardio: regular rate and rhythm, S1, S2 normal, no murmur, click, rub or gallop  GI: soft, non-tender; bowel sounds normal; no masses, no organomegaly  Extremities: extremities normal, atraumatic, no cyanosis or edema  Skin: Skin color, texture, turgor normal. No rashes or lesions  Neurologic: Alert and oriented X 3, normal strength and tone. Normal symmetric reflexes. Normal coordination and gait   Labs on Admission:  Basic Metabolic Panel:  Lab 05/05/12 3086  NA 138  K 4.2  CL 101  CO2 24  GLUCOSE 91  BUN 39*  CREATININE 1.73*  CALCIUM 9.1  MG --  PHOS --   Liver Function Tests: No results found for this basename: AST:5,ALT:5,ALKPHOS:5,BILITOT:5,PROT:5,ALBUMIN:5 in the last 168 hours No results found for this basename: LIPASE:5,AMYLASE:5 in the last 168 hours No results found for this basename: AMMONIA:5 in the last 168 hours CBC:  Lab 05/05/12 1705  WBC 13.5*  NEUTROABS 11.4*  HGB 10.4*  HCT 31.8*  MCV 86.6  PLT 314   Cardiac Enzymes:  Lab 05/05/12 1705  CKTOTAL --  CKMB --  CKMBINDEX --  TROPONINI <0.30    BNP (last 3 results) No results found for this basename: PROBNP:3 in the last 8760 hours CBG: No results found for this basename: GLUCAP:5 in the last 168 hours  Radiological Exams on Admission: Dg Chest 1 View  05/05/2012  *RADIOLOGY REPORT*  Clinical Data: Fall.  Hip fracture.  CHEST - 1 VIEW  Comparison: 02/25/2010  Findings: There is hyperinflation of the lungs compatible with COPD.  Heart is borderline in size.  No confluent opacities or effusions.  No acute bony abnormality.  IMPRESSION: COPD.  No acute findings.   Original Report Authenticated By: Charlett Nose, M.D.    Dg Hip Complete Right  05/05/2012  *RADIOLOGY REPORT*  Clinical Data: Fall, right hip injury.   RIGHT HIP - COMPLETE 2+ VIEW  Comparison: None.  Findings: There is a right femoral intertrochanteric fracture. Minimal displacement and angulation.  No hip dislocation or subluxation.  Prior left hip replacement.  IMPRESSION: Right femoral intertrochanteric fracture.   Original Report Authenticated By: Charlett Nose, M.D.    Ct Head Wo Contrast  05/05/2012  *RADIOLOGY REPORT*  Clinical Data: Fall.Dizziness.  CT HEAD WITHOUT CONTRAST  Technique:  Contiguous axial images were obtained from the base of the skull through the vertex without contrast.  Comparison: MRI 01/13/2010.  CT 01/03/2010  Findings: Age appropriate atrophy. No acute intracranial abnormality.  Specifically, no hemorrhage, hydrocephalus, mass lesion, acute infarction, or significant intracranial injury.  No acute calvarial abnormality. Visualized paranasal sinuses and mastoids clear.  Orbital soft tissues unremarkable.  IMPRESSION: No acute intracranial abnormality.   Original Report Authenticated By: Charlett Nose, M.D.  EKG: Independently reviewed.   Assessment/Plan Principal Problem:  *Intertrochanteric fracture of right hip Active Problems:  HYPOTHYROIDISM  HYPERTENSION  CKD (chronic kidney disease), stage III   Right intertrochanteric hip fracture -Secondary to fall, patient has history of senile osteoporosis. -Orthopedic consulted for further management, patient was seen by Dr. Janee Morn. -Patient for surgical repair tonight, keep n.p.o. -Discussed with Dr. Janee Morn, patient is moderate risk based on CKD and functional status, continue her beta blockers. -The surgery she is going to have this time likely to be shorter in time then the surgery she had last time.   Fall -Patient fell after she was in the bathroom, this is classical for vasovagal attack. -Vasovagal attacks usually precipitated by dehydration and/or Valsalva-like maneuver. -She does not have focal neurological deficits, although unlikely but I will rule  out ACS with serial cardiac enzymes. -Will hold her Bumex, hydrate with IV fluids for one day. -We will discontinue the IV fluids and restart the Bumex likely in 24-48 hours.  CKD stage III -Patient baseline creatinine is about 1.6, she was presented today with a creatinine of 1.73. -Bumex discontinued. Hydrate gently with IV fluids, reassess fluid status daily. -Check BMP in a.m.  Hypertension -Patient is on Zebeta, continue beta blockers for now.  Code Status:  full code Family Communication:  spoke to her nephew who was at bedside  Disposition Plan:  inpatient, telemetry)  Time spent:  70 minutes  [facility] Pager [phone]  If 7PM-7AM, please contact night-coverage [email or web address] Password [facility] 05/05/2012, 7:15 PM

## 2012-05-05 NOTE — ED Notes (Signed)
Pt resting/sleeping on venti mask, NAD, calm, 91-93% on 12L, NSR on monitor, VSS. Report called to floor, primary receiving RN. [id number]

## 2012-05-05 NOTE — Consult Note (Signed)
ORTHOPAEDIC CONSULTATION  REQUESTING PHYSICIAN: Charles B. Bernette Mayers, MD  Chief Complaint: Right hip pain  HPI: Rachael [name] is a 78 y.o. female who complains of  right hip pain. She underwent left hip hemiarthroplasty by Dr. Gean Birchwood about 6 weeks ago. She has transitioned back to her home. She became dizzy and fell in the bathroom. She presented to the emergency department with right hip pain and inability to bear weight on the right side.  Past Medical History  Diagnosis Date  . Thyroid disease   . Hypertension   . Senile osteoporosis    Past Surgical History  Procedure Date  . Inguinal hernia repair   . Tonsillectomy   . Hip arthroplasty 04/01/2012    Procedure: ARTHROPLASTY BIPOLAR HIP;  Surgeon: [name], MD;  Location: [facility];  Service: Orthopedics;  Laterality: Left;   History   Social History  . Marital Status: Widowed    Spouse Name: N/A    Number of Children: N/A  . Years of Education: N/A   Social History Main Topics  . Smoking status: Former Smoker    Quit date: 05/06/1971  . Smokeless tobacco: Former Neurosurgeon    Quit date: 11/21/1977  . Alcohol Use: No  . Drug Use: No  . Sexually Active:    Other Topics Concern  . None   Social History Narrative  . None   Family History  Problem Relation Age of Onset  . CVA Mother     died age 3  . Emphysema Sister    No Known Allergies Prior to Admission medications   Medication Sig Start Date End Date Taking? Authorizing Provider  alendronate (FOSAMAX) 70 MG tablet Take 70 mg by mouth every 7 (seven) days. Friday.Take with a full glass of water on an empty stomach.   Yes Historical Provider, MD  aspirin EC 325 MG tablet Take 650 mg by mouth at bedtime.   Yes Historical Provider, MD  Biotin 1000 MCG tablet Take 1,000 mcg by mouth daily.   Yes Historical Provider, MD  bisoprolol (ZEBETA) 10 MG tablet Take 1 tablet (10 mg total) by mouth daily. 04/04/12  Yes Novlet Adelina Mings, MD  bumetanide (BUMEX) 1 MG  tablet Take 1 mg by mouth daily. Take 1/2 tablet every other day on Monday, Wednesday, Friday and Sunday.   Yes Historical Provider, MD  levothyroxine (SYNTHROID, LEVOTHROID) 125 MCG tablet Take 187.5 mcg by mouth daily. Take 1 and 1/2 of the 125 mcg tablets   Yes Historical Provider, MD  Multiple Vitamin (MULTIVITAMIN WITH MINERALS) TABS Take 1 tablet by mouth daily.   Yes Historical Provider, MD  Olmesartan Medoxomil (BENICAR PO) Take by mouth daily. 1/4 tablet daily.   Yes Historical Provider, MD  Red Yeast Rice Extract (RED YEAST RICE PO) Take 3 capsules by mouth daily.    Yes Historical Provider, MD  zinc gluconate 50 MG tablet Take 50 mg by mouth daily.   Yes Historical Provider, MD   Dg Chest 1 View  05/05/2012  *RADIOLOGY REPORT*  Clinical Data: Fall.  Hip fracture.  CHEST - 1 VIEW  Comparison: 02/25/2010  Findings: There is hyperinflation of the lungs compatible with COPD.  Heart is borderline in size.  No confluent opacities or effusions.  No acute bony abnormality.  IMPRESSION: COPD.  No acute findings.   Original Report Authenticated By: Charlett Nose, M.D.    Dg Hip Complete Right  05/05/2012  *RADIOLOGY REPORT*  Clinical Data: Fall, right hip injury.  RIGHT HIP - COMPLETE 2+ VIEW  Comparison: None.  Findings: There is a right femoral intertrochanteric fracture. Minimal displacement and angulation.  No hip dislocation or subluxation.  Prior left hip replacement.  IMPRESSION: Right femoral intertrochanteric fracture.   Original Report Authenticated By: Charlett Nose, M.D.    Ct Head Wo Contrast  05/05/2012  *RADIOLOGY REPORT*  Clinical Data: Fall.Dizziness.  CT HEAD WITHOUT CONTRAST  Technique:  Contiguous axial images were obtained from the base of the skull through the vertex without contrast.  Comparison: MRI 01/13/2010.  CT 01/03/2010  Findings: Age appropriate atrophy. No acute intracranial abnormality.  Specifically, no hemorrhage, hydrocephalus, mass lesion, acute infarction, or  significant intracranial injury.  No acute calvarial abnormality. Visualized paranasal sinuses and mastoids clear.  Orbital soft tissues unremarkable.  IMPRESSION: No acute intracranial abnormality.   Original Report Authenticated By: Charlett Nose, M.D.     Positive ROS: All other systems have been reviewed and were otherwise negative with the exception of those mentioned in the HPI and as above.  Physical Exam: Vitals: Refer to EMR. Constitutional:  WD, WN, NAD HEENT:  NCAT, EOMI Neuro/Psych:  Alert & oriented to person, place, and time; appropriate mood & affect Lymphatic: No generalized edema or lymphadenopathy Extremities / MSK:  The extremities are normal with respect to appearance, ranges of motion, joint stability, muscle strength/tone, sensation, & perfusion except as otherwise noted:   The right hip is held flexed, with pillow under the knee. There are no scrapes or abrasions in the area of the lateral thigh. She can flex and extend the toes and ankle and feels light touch in the plantar and dorsal surface of the foot. Toes are warm with brisk capillary refill. DP pulses palpable.  Assessment: Right intertrochanteric femur fracture  Plan: Status with the patient and her power of attorney this injury and plan to proceed operatively with ORIF when the OR is available. She has been without food or drink since this morning. Of course, she'll be evaluated by the internal medicine hospitalist service for formal clearance and can proceed to the operating room once cleared. Should be admitted via the fracture hip clinical pathway. The goals, risks, benefits and options of treatment were discussed, and informed consent obtained. Document was executed. Questions were provided and answered.

## 2012-05-05 NOTE — ED Notes (Signed)
Pt arrived by EMS from home. Pt was in the bathroom and when she stood up pt became dizzy and fell to the floor. No LOC. Pt c/o right thigh pain. Pt was d/c 2 weeks ago from [facility] from previous fall that caused left hip fx. Pt stated that her thyroid medication was increased and Bumex was changed a few days ago and the dizziness started after that, 3 nights ago that occurs when lying in bed.

## 2012-05-05 NOTE — ED Notes (Signed)
Pt transported to CT ?

## 2012-05-05 NOTE — ED Notes (Signed)
BLE pink supple and warm, CMS intact, R hip initialed by ortho MD, foley bag with ~ 300cc clear yellow urine.

## 2012-05-05 NOTE — ED Provider Notes (Signed)
History     CSN: [id number]  Arrival date & time 05/05/12  1414   First MD Initiated Contact with Patient 05/05/12 1538      Chief Complaint  Patient presents with  . Fall    (Consider location/radiation/quality/duration/timing/severity/associated sxs/prior treatment) HPI Pt reports she has had intermittent, self-limited vertiginous dizzy spells for the last 3 days. However today while getting up from the toilet she had a lightheaded dizzy spell with near -syncope, vision fading and falling down. She landed on her R side (has had recent L hip fracture ORIF). Denies head injury. Complaining of moderate to severe aching pain in R hip, thigh and knee, worse with movement.   Past Medical History  Diagnosis Date  . Thyroid disease   . Hypertension   . Senile osteoporosis     Past Surgical History  Procedure Date  . Inguinal hernia repair   . Tonsillectomy   . Hip arthroplasty 04/01/2012    Procedure: ARTHROPLASTY BIPOLAR HIP;  Surgeon: [name], MD;  Location: [facility];  Service: Orthopedics;  Laterality: Left;    Family History  Problem Relation Age of Onset  . CVA Mother     died age 49  . Emphysema Sister     History  Substance Use Topics  . Smoking status: Former Smoker    Quit date: 05/06/1971  . Smokeless tobacco: Former Neurosurgeon    Quit date: 11/21/1977  . Alcohol Use: No    OB History    Grav Para Term Preterm Abortions TAB SAB Ect Mult Living                  Review of Systems All other systems reviewed and are negative except as noted in HPI.   Allergies  Review of patient's allergies indicates no known allergies.  Home Medications   Current Outpatient Rx  Name  Route  Sig  Dispense  Refill  . ALENDRONATE SODIUM 70 MG PO TABS   Oral   Take 70 mg by mouth every 7 (seven) days. Friday.Take with a full glass of water on an empty stomach.         . ASPIRIN EC 325 MG PO TBEC   Oral   Take 650 mg by mouth at bedtime.         Marland Kitchen BIOTIN 1000 MCG  PO TABS   Oral   Take 1,000 mcg by mouth daily.         Marland Kitchen BISOPROLOL FUMARATE 10 MG PO TABS   Oral   Take 1 tablet (10 mg total) by mouth daily.   30 tablet   0   . BUMETANIDE 1 MG PO TABS   Oral   Take 1 mg by mouth daily. Take 1/2 tablet every other day on Monday, Wednesday, Friday and Sunday.         Marland Kitchen LEVOTHYROXINE SODIUM 125 MCG PO TABS   Oral   Take 187.5 mcg by mouth daily. Take 1 and 1/2 of the 125 mcg tablets         . ADULT MULTIVITAMIN W/MINERALS CH   Oral   Take 1 tablet by mouth daily.         Marland Kitchen BENICAR PO   Oral   Take by mouth daily. 1/4 tablet daily.         . RED YEAST RICE PO   Oral   Take 3 capsules by mouth daily.          Marland Kitchen ZINC GLUCONATE  50 MG PO TABS   Oral   Take 50 mg by mouth daily.           BP 148/59  Pulse 64  Temp 97.5 F (36.4 C) (Oral)  Resp 18  SpO2 99%  Physical Exam  Nursing note and vitals reviewed. Constitutional: She is oriented to person, place, and time. She appears well-developed and well-nourished.  HENT:  Head: Normocephalic and atraumatic.  Eyes: EOM are normal. Pupils are equal, round, and reactive to light.  Neck: Normal range of motion. Neck supple.       No midline c-spine tenderness  Cardiovascular: Normal rate, normal heart sounds and intact distal pulses.   Pulmonary/Chest: Effort normal and breath sounds normal.  Abdominal: Bowel sounds are normal. She exhibits no distension. There is no tenderness.  Musculoskeletal: Normal range of motion. She exhibits tenderness (R thigh tenderness, pain worse with ROM of R leg, neuovascularly intact). She exhibits no edema.  Neurological: She is alert and oriented to person, place, and time. She has normal strength. No cranial nerve deficit or sensory deficit.  Skin: Skin is warm and dry. No rash noted.  Psychiatric: She has a normal mood and affect.    ED Course  Procedures (including critical care time)   Labs Reviewed  BASIC METABOLIC PANEL  CBC  WITH DIFFERENTIAL  PROTIME-INR  TYPE AND SCREEN  TROPONIN I   Dg Chest 1 View  05/05/2012  *RADIOLOGY REPORT*  Clinical Data: Fall.  Hip fracture.  CHEST - 1 VIEW  Comparison: 02/25/2010  Findings: There is hyperinflation of the lungs compatible with COPD.  Heart is borderline in size.  No confluent opacities or effusions.  No acute bony abnormality.  IMPRESSION: COPD.  No acute findings.   Original Report Authenticated By: Charlett Nose, M.D.    Dg Hip Complete Right  05/05/2012  *RADIOLOGY REPORT*  Clinical Data: Fall, right hip injury.  RIGHT HIP - COMPLETE 2+ VIEW  Comparison: None.  Findings: There is a right femoral intertrochanteric fracture. Minimal displacement and angulation.  No hip dislocation or subluxation.  Prior left hip replacement.  IMPRESSION: Right femoral intertrochanteric fracture.   Original Report Authenticated By: Charlett Nose, M.D.    Ct Head Wo Contrast  05/05/2012  *RADIOLOGY REPORT*  Clinical Data: Fall.Dizziness.  CT HEAD WITHOUT CONTRAST  Technique:  Contiguous axial images were obtained from the base of the skull through the vertex without contrast.  Comparison: MRI 01/13/2010.  CT 01/03/2010  Findings: Age appropriate atrophy. No acute intracranial abnormality.  Specifically, no hemorrhage, hydrocephalus, mass lesion, acute infarction, or significant intracranial injury.  No acute calvarial abnormality. Visualized paranasal sinuses and mastoids clear.  Orbital soft tissues unremarkable.  IMPRESSION: No acute intracranial abnormality.   Original Report Authenticated By: Charlett Nose, M.D.      No diagnosis found.    MDM  Xray shows hip fracture as above. Discussed with MD on call for Dr. Turner Daniels with Ortho. Awaiting lab results to discuss with [facility]. Move to CDU pending those labs. Discussed with Tiburcio Pea, PA       Bonnita Levan. Bernette Mayers, MD 05/05/12 1745

## 2012-05-06 ENCOUNTER — Encounter (HOSPITAL_COMMUNITY): Payer: Self-pay | Admitting: Certified Registered Nurse Anesthetist

## 2012-05-06 ENCOUNTER — Inpatient Hospital Stay (HOSPITAL_COMMUNITY): Payer: Medicare Other

## 2012-05-06 ENCOUNTER — Encounter (HOSPITAL_COMMUNITY)
Admission: EM | Disposition: A | Discharge status: Skilled Nursing Facility | Payer: Self-pay | Source: Home / Self Care | Attending: Internal Medicine

## 2012-05-06 ENCOUNTER — Inpatient Hospital Stay (HOSPITAL_COMMUNITY): Payer: Medicare Other | Admitting: Certified Registered Nurse Anesthetist

## 2012-05-06 DIAGNOSIS — S40029A Contusion of unspecified upper arm, initial encounter: Secondary | ICD-10-CM

## 2012-05-06 DIAGNOSIS — R42 Dizziness and giddiness: Secondary | ICD-10-CM

## 2012-05-06 DIAGNOSIS — S0003XA Contusion of scalp, initial encounter: Secondary | ICD-10-CM

## 2012-05-06 HISTORY — PX: COMPRESSION HIP SCREW: SHX1386

## 2012-05-06 LAB — CBC
MCV: 87.9 fL (ref 78.0–100.0)
Platelets: 252 10*3/uL (ref 150–400)
RBC: 3.47 MIL/uL — ABNORMAL LOW (ref 3.87–5.11)
RDW: 13.8 % (ref 11.5–15.5)
WBC: 8.2 10*3/uL (ref 4.0–10.5)

## 2012-05-06 LAB — BASIC METABOLIC PANEL
CO2: 25 mEq/L (ref 19–32)
Chloride: 108 mEq/L (ref 96–112)
Creatinine, Ser: 1.66 mg/dL — ABNORMAL HIGH (ref 0.50–1.10)
GFR calc Af Amer: 31 mL/min — ABNORMAL LOW (ref >90)
Potassium: 4.8 mEq/L (ref 3.5–5.1)

## 2012-05-06 LAB — URINE MICROSCOPIC-ADD ON

## 2012-05-06 LAB — URINALYSIS, ROUTINE W REFLEX MICROSCOPIC
Glucose, UA: NEGATIVE mg/dL
Hgb urine dipstick: NEGATIVE
Protein, ur: NEGATIVE mg/dL
Specific Gravity, Urine: 1.019 (ref 1.005–1.030)
Urobilinogen, UA: 0.2 mg/dL (ref 0.0–1.0)

## 2012-05-06 LAB — SURGICAL PCR SCREEN
MRSA, PCR: NEGATIVE
Staphylococcus aureus: POSITIVE — AB

## 2012-05-06 LAB — TROPONIN I: Troponin I: <0.3 ng/mL (ref <0.30)

## 2012-05-06 SURGERY — COMPRESSION HIP
Anesthesia: General | Site: Hip | Laterality: Right

## 2012-05-06 MED ORDER — POLYETHYLENE GLYCOL 3350 17 G PO PACK
17.0000 g | PACK | Freq: Every day | ORAL | Status: DC
  Administered 2012-05-08 – 2012-05-09 (×2): 17 g via ORAL
  Filled 2012-05-06 (×4): qty 1

## 2012-05-06 MED ORDER — BISOPROLOL FUMARATE 10 MG PO TABS
10.0000 mg | ORAL_TABLET | Freq: Every day | ORAL | Status: DC
  Filled 2012-05-06 (×2): qty 1

## 2012-05-06 MED ORDER — LIDOCAINE HCL (CARDIAC) 20 MG/ML IV SOLN
INTRAVENOUS | Status: DC | PRN
  Administered 2012-05-06: 40 mg via INTRAVENOUS

## 2012-05-06 MED ORDER — MUPIROCIN 2 % EX OINT
1.0000 "application " | TOPICAL_OINTMENT | Freq: Two times a day (BID) | CUTANEOUS | Status: DC
  Administered 2012-05-06 – 2012-05-09 (×7): 1 via NASAL
  Filled 2012-05-06: qty 22

## 2012-05-06 MED ORDER — METOCLOPRAMIDE HCL 5 MG/ML IJ SOLN: 10.0000 mg | Freq: Once | INTRAMUSCULAR | Status: DC | PRN

## 2012-05-06 MED ORDER — CHLORHEXIDINE GLUCONATE CLOTH 2 % EX PADS
6.0000 | MEDICATED_PAD | Freq: Every day | CUTANEOUS | Status: DC
  Administered 2012-05-06 – 2012-05-09 (×4): 6 via TOPICAL

## 2012-05-06 MED ORDER — PROPOFOL 10 MG/ML IV BOLUS
INTRAVENOUS | Status: DC | PRN
  Administered 2012-05-06: 100 mg via INTRAVENOUS

## 2012-05-06 MED ORDER — ASPIRIN EC 325 MG PO TBEC
325.0000 mg | DELAYED_RELEASE_TABLET | Freq: Every day | ORAL | Status: DC
  Administered 2012-05-07 – 2012-05-09 (×3): 325 mg via ORAL
  Filled 2012-05-06 (×4): qty 1

## 2012-05-06 MED ORDER — SODIUM CHLORIDE 0.9 % IV SOLN: INTRAVENOUS | Status: DC

## 2012-05-06 MED ORDER — CEFAZOLIN SODIUM-DEXTROSE 2-3 GM-% IV SOLR
2.0000 g | Freq: Four times a day (QID) | INTRAVENOUS | Status: AC
  Administered 2012-05-06 (×2): 2 g via INTRAVENOUS
  Filled 2012-05-06 (×2): qty 50

## 2012-05-06 MED ORDER — PHENYLEPHRINE HCL 10 MG/ML IJ SOLN
10.0000 mg | INTRAMUSCULAR | Status: DC | PRN
  Administered 2012-05-06: 80 ug/min via INTRAVENOUS

## 2012-05-06 MED ORDER — HYDROMORPHONE HCL PF 1 MG/ML IJ SOLN: 0.2500 mg | INTRAMUSCULAR | Status: DC | PRN

## 2012-05-06 MED ORDER — LEVOTHYROXINE SODIUM 125 MCG PO TABS
187.5000 ug | ORAL_TABLET | Freq: Every day | ORAL | Status: DC
  Administered 2012-05-07 – 2012-05-09 (×3): 187.5 ug via ORAL
  Filled 2012-05-06 (×5): qty 1.5

## 2012-05-06 MED ORDER — ONDANSETRON HCL 4 MG PO TABS: 4.0000 mg | ORAL_TABLET | Freq: Four times a day (QID) | ORAL | Status: DC | PRN

## 2012-05-06 MED ORDER — LACTATED RINGERS IV SOLN
INTRAVENOUS | Status: DC | PRN
  Administered 2012-05-06: 09:00:00 via INTRAVENOUS

## 2012-05-06 MED ORDER — SODIUM CHLORIDE 0.9 % IV SOLN
INTRAVENOUS | Status: DC
  Administered 2012-05-06: 01:00:00 via INTRAVENOUS

## 2012-05-06 MED ORDER — HYDROCODONE-ACETAMINOPHEN 5-325 MG PO TABS: 1.0000 | ORAL_TABLET | Freq: Four times a day (QID) | ORAL | Status: DC | PRN

## 2012-05-06 MED ORDER — DEXAMETHASONE SODIUM PHOSPHATE 10 MG/ML IJ SOLN
INTRAMUSCULAR | Status: DC | PRN
  Administered 2012-05-06: 4 mg via INTRAVENOUS

## 2012-05-06 MED ORDER — ADULT MULTIVITAMIN W/MINERALS CH
1.0000 | ORAL_TABLET | Freq: Every day | ORAL | Status: DC
  Administered 2012-05-07 – 2012-05-09 (×3): 1 via ORAL
  Filled 2012-05-06 (×4): qty 1

## 2012-05-06 MED ORDER — HYDROCODONE-ACETAMINOPHEN 5-325 MG PO TABS
ORAL_TABLET | ORAL | Status: AC
  Filled 2012-05-06: qty 2

## 2012-05-06 MED ORDER — OXYCODONE HCL 5 MG PO TABS
5.0000 mg | ORAL_TABLET | ORAL | Status: DC | PRN
  Administered 2012-05-06: 10 mg via ORAL
  Administered 2012-05-06 – 2012-05-07 (×2): 5 mg via ORAL
  Filled 2012-05-06 (×2): qty 2
  Filled 2012-05-06: qty 1

## 2012-05-06 MED ORDER — MORPHINE SULFATE 2 MG/ML IJ SOLN: 2.0000 mg | INTRAMUSCULAR | Status: DC | PRN

## 2012-05-06 MED ORDER — HYDROMORPHONE HCL PF 1 MG/ML IJ SOLN
INTRAMUSCULAR | Status: AC
  Administered 2012-05-06: 0.5 mg
  Filled 2012-05-06: qty 1

## 2012-05-06 MED ORDER — 0.9 % SODIUM CHLORIDE (POUR BTL) OPTIME
TOPICAL | Status: DC | PRN
  Administered 2012-05-06: 1000 mL

## 2012-05-06 MED ORDER — ONDANSETRON HCL 4 MG/2ML IJ SOLN
INTRAMUSCULAR | Status: DC | PRN
  Administered 2012-05-06: 4 mg via INTRAVENOUS

## 2012-05-06 MED ORDER — OXYCODONE HCL 5 MG/5ML PO SOLN: 5.0000 mg | Freq: Once | ORAL | Status: DC | PRN

## 2012-05-06 MED ORDER — HYDROCODONE-ACETAMINOPHEN 5-325 MG PO TABS
1.0000 | ORAL_TABLET | Freq: Four times a day (QID) | ORAL | Status: DC | PRN
  Administered 2012-05-06: 2 via ORAL

## 2012-05-06 MED ORDER — MORPHINE SULFATE 2 MG/ML IJ SOLN
0.5000 mg | INTRAMUSCULAR | Status: DC | PRN
  Administered 2012-05-07 (×2): 0.5 mg via INTRAVENOUS
  Filled 2012-05-06 (×2): qty 1

## 2012-05-06 MED ORDER — HYDROCODONE-ACETAMINOPHEN 5-325 MG PO TABS
1.0000 | ORAL_TABLET | Freq: Four times a day (QID) | ORAL | Status: DC | PRN
  Administered 2012-05-06: 1 via ORAL
  Filled 2012-05-06: qty 1

## 2012-05-06 MED ORDER — GLYCOPYRROLATE 0.2 MG/ML IJ SOLN
INTRAMUSCULAR | Status: DC | PRN
  Administered 2012-05-06: 0.4 mg via INTRAVENOUS

## 2012-05-06 MED ORDER — SODIUM CHLORIDE 0.9 % IV SOLN
INTRAVENOUS | Status: DC | PRN
  Administered 2012-05-06: 08:00:00 via INTRAVENOUS

## 2012-05-06 MED ORDER — MORPHINE SULFATE 2 MG/ML IJ SOLN: 0.5000 mg | INTRAMUSCULAR | Status: DC | PRN

## 2012-05-06 MED ORDER — FENTANYL CITRATE 0.05 MG/ML IJ SOLN
INTRAMUSCULAR | Status: DC | PRN
  Administered 2012-05-06 (×5): 50 ug via INTRAVENOUS

## 2012-05-06 MED ORDER — MAGNESIUM HYDROXIDE 400 MG/5ML PO SUSP: 30.0000 mL | Freq: Every day | ORAL | Status: DC | PRN

## 2012-05-06 MED ORDER — ONDANSETRON HCL 4 MG/2ML IJ SOLN
4.0000 mg | Freq: Four times a day (QID) | INTRAMUSCULAR | Status: DC | PRN
  Administered 2012-05-09: 4 mg via INTRAVENOUS
  Filled 2012-05-06: qty 2

## 2012-05-06 MED ORDER — DOCUSATE SODIUM 100 MG PO CAPS
100.0000 mg | ORAL_CAPSULE | Freq: Two times a day (BID) | ORAL | Status: DC
  Administered 2012-05-06 – 2012-05-09 (×6): 100 mg via ORAL
  Filled 2012-05-06 (×6): qty 1

## 2012-05-06 MED ORDER — CEFAZOLIN SODIUM-DEXTROSE 2-3 GM-% IV SOLR
2.0000 g | INTRAVENOUS | Status: AC
  Administered 2012-05-06: 2 g via INTRAVENOUS
  Filled 2012-05-06: qty 50

## 2012-05-06 MED ORDER — NEOSTIGMINE METHYLSULFATE 1 MG/ML IJ SOLN
INTRAMUSCULAR | Status: DC | PRN
  Administered 2012-05-06: 2 mg via INTRAVENOUS

## 2012-05-06 MED ORDER — HYDROMORPHONE HCL PF 1 MG/ML IJ SOLN: 0.5000 mg | INTRAMUSCULAR | Status: DC | PRN

## 2012-05-06 MED ORDER — ROCURONIUM BROMIDE 100 MG/10ML IV SOLN
INTRAVENOUS | Status: DC | PRN
  Administered 2012-05-06: 30 mg via INTRAVENOUS

## 2012-05-06 MED ORDER — ENSURE COMPLETE PO LIQD
237.0000 mL | Freq: Two times a day (BID) | ORAL | Status: DC
  Administered 2012-05-06 – 2012-05-09 (×5): 237 mL via ORAL

## 2012-05-06 SURGICAL SUPPLY — 30 items
BIT DRILL TWIST 3.8X7 (BIT) ×2 IMPLANT
CLOTH BEACON ORANGE TIMEOUT ST (SAFETY) ×1 IMPLANT
DRAPE STERI IOBAN 125X83 (DRAPES) ×1 IMPLANT
DRSG MEPILEX BORDER 4X8 (GAUZE/BANDAGES/DRESSINGS) ×1 IMPLANT
DRSG PAD ABDOMINAL 8X10 ST (GAUZE/BANDAGES/DRESSINGS) ×1 IMPLANT
DURAPREP 26ML APPLICATOR (WOUND CARE) ×2 IMPLANT
ELECT REM PT RETURN 9FT ADLT (ELECTROSURGICAL) ×2
ELECTRODE REM PT RTRN 9FT ADLT (ELECTROSURGICAL) ×1 IMPLANT
GLOVE BIO SURGEON STRL SZ 6.5 (GLOVE) ×1 IMPLANT
GLOVE BIO SURGEON STRL SZ7.5 (GLOVE) ×2 IMPLANT
GLOVE BIOGEL PI IND STRL 8 (GLOVE) IMPLANT
GLOVE BIOGEL PI INDICATOR 8 (GLOVE) ×1
GOWN STRL NON-REIN LRG LVL3 (GOWN DISPOSABLE) ×1 IMPLANT
KIT BASIN OR (CUSTOM PROCEDURE TRAY) ×1 IMPLANT
KIT ROOM TURNOVER OR (KITS) ×1 IMPLANT
NS IRRIG 1000ML POUR BTL (IV SOLUTION) ×1 IMPLANT
PACK GENERAL/GYN (CUSTOM PROCEDURE TRAY) ×4 IMPLANT
PAD ARMBOARD 7.5X6 YLW CONV (MISCELLANEOUS) ×1 IMPLANT
PIN THREADED GUIDE ACE (PIN) ×1 IMPLANT
PLATE COMP 135D 3H STD (Plate) ×1 IMPLANT
SCREW ACECAP 38MM (Screw) ×1 IMPLANT
SCREW ACECAP 40MM (Screw) ×2 IMPLANT
SCREW COMPRESSION SHORT (Screw) ×2 IMPLANT
SCREW LAG 100MM (Screw) ×1 IMPLANT
STAPLER VISISTAT 35W (STAPLE) ×1 IMPLANT
SUT VIC AB 0 CT1 27 (SUTURE) ×2
SUT VIC AB 0 CT1 27XBRD ANBCTR (SUTURE) IMPLANT
SUT VIC AB 2-0 CT1 27 (SUTURE) ×2
SUT VIC AB 2-0 CT1 TAPERPNT 27 (SUTURE) IMPLANT
TOWEL OR 17X26 10 PK STRL BLUE (TOWEL DISPOSABLE) ×1 IMPLANT

## 2012-05-06 NOTE — Progress Notes (Signed)
INITIAL NUTRITION ASSESSMENT  DOCUMENTATION CODES Per approved criteria  -Not Applicable    INTERVENTION: 1. Ensure Complete po BID, each supplement provides 350 kcal and 13 grams of protein. 2. RD will continue to follow    NUTRITION DIAGNOSIS: Increased nutrient needs related to healing as evidenced by estimated nutrition needs.   Goal: PO intake to meet >/=90% estimated nutrition needs  Monitor:  PO intake, weight trends, labs, I/O's  Reason for Assessment: Consult, Hip Fracture protocol   78 y.o. female  Admitting Dx: Intertrochanteric fracture of right hip  ASSESSMENT: Pt with recent L hip fx, admitted for fx to right hip.  S/p ORIF of right hip this morning.  Pt eating CL lunch at time of RD visit. States weight has been variable with fluid status. Was not eating very well at SNF for previous hip fx. At home, was eating better. Drinks Ensure 1-2 times daily. Will continue this.  Pt reports LE edema, may be masking weight loss.   Height: Ht Readings from Last 1 Encounters:  04/01/12 5\' 8"  (1.727 m)   Weight: Wt Readings from Last 1 Encounters:  04/01/12 137 lb 5.6 oz (62.3 kg)   Ideal Body Weight: 63.6 kg   % Ideal Body Weight: 98%  Wt Readings from Last 10 Encounters:  04/01/12 137 lb 5.6 oz (62.3 kg)  04/01/12 137 lb 5.6 oz (62.3 kg)  02/05/10 137 lb (62.143 kg)  01/06/10 136 lb (61.689 kg)   Usual Body Weight: Pt unsure, thinks she is close to baseline  % Usual Body Weight: ~100%  BMI:  20.8 kg/(m^2)    WNL   Estimated Nutritional Needs: Kcal: 1400-1600 Protein: 62-75 gm  Fluid: 1.4-1.6 L/day  Skin: s/p surgical interventions  Diet Order: Full Liquid  EDUCATION NEEDS: -No education needs identified at this time   Intake/Output Summary (Last 24 hours) at 05/06/12 1319 Last data filed at 05/06/12 0909  Gross per 24 hour  Intake   1100 ml  Output   1000 ml  Net    100 ml   Last BM: PTA   Labs:   Lab 05/06/12 0610 05/05/12 1705   NA 142 138  K 4.8 4.2  CL 108 101  CO2 25 24  BUN 37* 39*  CREATININE 1.66* 1.73*  CALCIUM 8.6 9.1  MG -- --  PHOS -- --  GLUCOSE 112* 91    CBG (last 3)  No results found for this basename: GLUCAP:3 in the last 72 hours  Scheduled Meds:   . aspirin EC  325 mg Oral Q breakfast  . bisoprolol  10 mg Oral Daily  .  ceFAZolin (ANCEF) IV  2 g Intravenous Q6H  . docusate sodium  100 mg Oral BID  . levothyroxine  187.5 mcg Oral QAC breakfast  . multivitamin with minerals  1 tablet Oral Daily  . polyethylene glycol  17 g Oral Daily   Continuous Infusions:   Past Medical History  Diagnosis Date  . Thyroid disease   . Hypertension   . Senile osteoporosis     Past Surgical History  Procedure Date  . Inguinal hernia repair   . Tonsillectomy   . Hip arthroplasty 04/01/2012    Procedure: ARTHROPLASTY BIPOLAR HIP;  Surgeon: [name], MD;  Location: [facility];  Service: Orthopedics;  Laterality: Left;      Clarene Duke RD, LDN After Hours/weekend pager [phone]

## 2012-05-06 NOTE — Progress Notes (Signed)
VASCULAR LAB PRELIMINARY  PRELIMINARY  PRELIMINARY  PRELIMINARY  Carotid Dopplers completed.    Preliminary report:  There is 40-59% ICA stenosis, bilaterally.  Vertebral artery flow is antegrade.  Alcario Tinkey, RVT 05/06/2012, 3:19 PM

## 2012-05-06 NOTE — Anesthesia Procedure Notes (Signed)
Procedure Name: Intubation Date/Time: 05/06/2012 7:50 AM Performed by: Alanda Amass A Pre-anesthesia Checklist: Emergency Drugs available, Patient identified, Timeout performed, Suction available and Patient being monitored Patient Re-evaluated:Patient Re-evaluated prior to inductionOxygen Delivery Method: Circle system utilized Preoxygenation: Pre-oxygenation with 100% oxygen Intubation Type: IV induction Ventilation: Mask ventilation without difficulty Laryngoscope Size: Mac and 3 Grade View: Grade II Tube type: Oral Tube size: 7.5 mm Number of attempts: 1 Airway Equipment and Method: Stylet Placement Confirmation: ETT inserted through vocal cords under direct vision,  breath sounds checked- equal and bilateral and positive ETCO2 Secured at: 21 cm Tube secured with: Tape Dental Injury: Teeth and Oropharynx as per pre-operative assessment

## 2012-05-06 NOTE — Anesthesia Preprocedure Evaluation (Addendum)
Anesthesia Evaluation  Patient identified by MRN, date of birth, ID band Patient awake    Reviewed: Allergy & Precautions, H&P , NPO status , Patient's Chart, lab work & pertinent test results, reviewed documented beta blocker date and time   Airway Mallampati: II TM Distance: >3 FB Neck ROM: full    Dental  (+) Edentulous Upper   Pulmonary former smoker,  breath sounds clear to auscultation        Cardiovascular hypertension, On Medications and On Home Beta Blockers + CAD + dysrhythmias Rhythm:regular     Neuro/Psych negative neurological ROS  negative psych ROS   GI/Hepatic negative GI ROS, Neg liver ROS,   Endo/Other  Hypothyroidism   Renal/GU Renal disease  negative genitourinary   Musculoskeletal   Abdominal   Peds  Hematology negative hematology ROS (+)   Anesthesia Other Findings See surgeon's H&P   Reproductive/Obstetrics negative OB ROS                          Anesthesia Physical Anesthesia Plan  ASA: III  Anesthesia Plan: General   Post-op Pain Management:    Induction: Intravenous  Airway Management Planned: Oral ETT  Additional Equipment:   Intra-op Plan:   Post-operative Plan: Extubation in OR  Informed Consent: I have reviewed the patients History and Physical, chart, labs and discussed the procedure including the risks, benefits and alternatives for the proposed anesthesia with the patient or authorized representative who has indicated his/her understanding and acceptance.   Dental Advisory Given  Plan Discussed with: CRNA and Surgeon  Anesthesia Plan Comments:         Anesthesia Quick Evaluation

## 2012-05-06 NOTE — Op Note (Signed)
05/05/2012 - 05/06/2012  9:13 AM  PATIENT:  Rachael [name]  78 y.o. female  PRE-OPERATIVE DIAGNOSIS:  Right intertrochanteric femur fracture  POST-OPERATIVE DIAGNOSIS:  Same  PROCEDURE:  ORIF right intertrochanteric femur fracture with sliding hip screw, TK2 by [employer], 135 3 hole 100 mm lag screw  SURGEON: Cliffton Asters. Janee Morn, MD  PHYSICIAN ASSISTANT: None  ANESTHESIA:  general  SPECIMENS:  None  DRAINS:   None  PREOPERATIVE INDICATIONS:  Rachael [name] is a  78 y.o. female with a diagnosis of displaced right intertrochanteric femur fracture  The risks benefits and alternatives were discussed with the patient preoperatively including but not limited to the risks of infection, bleeding, nerve injury, cardiopulmonary complications, the need for revision surgery, among others, and the patient's power of attorney verbalized understanding and consented to proceed.  OPERATIVE FINDINGS: Near anatomic reduction  OPERATIVE PROCEDURE:  After receiving prophylactic antibiotics, the patient was escorted to the operative theatre and placed in a supine position. She was repositioned on the OSI fracture table, and SCDs were applied and maintained throughout the case.  With the use of the fracture table, the fracture was reduced through manipulation. Reduction was near-anatomic. A surgical "time-out" was performed during which the planned procedure, proposed operative site, and the correct patient identity were compared to the operative consent and agreement confirmed by the circulating nurse according to current facility policy.    Lateral approach was made sharply with a scalpel through the skin subcutaneous tissues. The deep fascia was split in line with the skin and the vastus was raised in an L-shaped flap, with the long limb fairly posteriorly. The vastus was reflected anteriorly exposing the lateral aspect of the proximal femur. Guidepin was then placed with 135 guide device and proper  placement was confirmed fluoroscopically with both AP and lateral projections. The guide pin was then measured, resulting in a 100 mm screw length. Triple reamer was then used followed by placement of the lag screw/plate device. It was slotted to control rotational alignment. The plate was then secured to the shaft, traction released, and the fracture depressed in the compressing screw removed. Final images were obtained. The wound was copiously irrigated and closed in layers with 0 Vicryl sutures closing the vastus and the deep fascia in a combination of interrupted and running sutures. 2-0 Vicryl was then used to reapproximate Scarpa's fascia and as a deep dermal closure followed by staples at the skin. A Mepilex dressing was applied. The patient was awakened and taken to the recovery room in stable condition, breathing spontaneously.  Disposition:  The patient will return to the floor and continued to be cared for via the fractured hip multidisciplinary clinical care pathway.

## 2012-05-06 NOTE — Transfer of Care (Signed)
Immediate Anesthesia Transfer of Care Note  Patient: Rachael [name]  Procedure(s) Performed: Procedure(s) (LRB) with comments: COMPRESSION HIP (Right)  Patient Location: PACU  Anesthesia Type:General  Level of Consciousness: sedated  Airway & Oxygen Therapy: Patient Spontanous Breathing and Patient connected to nasal cannula oxygen  Post-op Assessment: Report given to PACU RN and Post -op Vital signs reviewed and stable  Post vital signs: Reviewed and stable  Complications: No apparent anesthesia complications

## 2012-05-06 NOTE — Progress Notes (Signed)
Placed pt's 3 rings in a specimen cup in the basket that she brought up from ED before she went to OR. Also pt has partial lower dentures and upper dentures that are in a denture cup labeled with her name on the counter in her room.

## 2012-05-06 NOTE — Progress Notes (Signed)
[facility]                                                                                Patient Demographics  Rachael [name], is a 78 y.o. female  ZOX:[id number]  [id number]  DOB - 06-Sep-1923  Admit date - 05/05/2012  Admitting Physician Clydia Llano, MD  Outpatient Primary MD for the patient is Nadean Corwin, MD  LOS - 1   Chief Complaint  Patient presents with  . Fall        Assessment & Plan      1. Syncope with fall causing right-sided intertrochanteric femoral fracture.- As tolerated open reduction and internal fixation done by Dr. Janee Morn on 05/06/2012 well, this is a second syncopal episode with femoral fracture in the last few months, we'll do syncopal workup including echo gram, carotid duplex, orthostatics , telemetry monitoring. Physical therapy, weightbearing and DVT prophylaxis per orthopedics, for now aspirin and SCDs  per orthopedics.     2. Recurrent syncope. Workup as indicated above.     3. History of hypothyroidism and hypertension continue home medications no acute issues.    4. Chronic kidney disease stage III. Baseline creatinine is around 1.6, she is stable no acute issues.    Code Status: Full  Family Communication: D/W Patient  Disposition Plan: TBD   Procedures ORIF for R.Femur fracture   Consults  Orthopedics   DVT Prophylaxis  ASA - SCDs per orthopedics  Lab Results  Component Value Date   PLT 252 05/06/2012    Medications  Scheduled Meds:   . aspirin EC  325 mg Oral Q breakfast  . bisoprolol  10 mg Oral Daily  .  ceFAZolin (ANCEF) IV  2 g Intravenous Q6H  . docusate sodium  100 mg Oral BID  . levothyroxine  187.5 mcg Oral QAC breakfast  . multivitamin with minerals  1 tablet Oral Daily  . polyethylene glycol  17 g Oral Daily   Continuous Infusions:  PRN Meds:.HYDROcodone-acetaminophen, magnesium hydroxide, morphine injection, ondansetron (ZOFRAN) IV, oxyCODONE  Antibiotics     Anti-infectives     Start     Dose/Rate Route Frequency Ordered Stop   05/06/12 1400   ceFAZolin (ANCEF) IVPB 2 g/50 mL premix        2 g 100 mL/hr over 30 Minutes Intravenous Every 6 hours 05/06/12 1059 05/07/12 0159   05/06/12 0003   ceFAZolin (ANCEF) IVPB 2 g/50 mL premix        2 g 100 mL/hr over 30 Minutes Intravenous 60 min pre-op 05/06/12 0003 05/06/12 0758           Time Spent in minutes   35   SINGH,PRASHANT K M.D on 05/06/2012 at 11:30 AM  Between 7am to 7pm - Pager - [phone]  After 7pm go to [email or web address] - password [email or web address]  And look for the night coverage person covering for me after hours  [facility] Office  [phone]    Subjective:   Rachael [name] today has, No headache, No chest pain, No abdominal pain - No Nausea, No new weakness tingling or numbness, No Cough - SOB. Some postop right-sided hip pain.  Objective:   Filed  Vitals:   05/06/12 1000 05/06/12 1015 05/06/12 1051 05/06/12 1052  BP: 145/41 133/43 122/38 115/42  Pulse: 53 44 55 47  Temp:  97.5 F (36.4 C)    TempSrc:      Resp: 14 14 16    SpO2: 100% 100% 99%     Wt Readings from Last 3 Encounters:  04/01/12 62.3 kg (137 lb 5.6 oz)  04/01/12 62.3 kg (137 lb 5.6 oz)  02/05/10 62.143 kg (137 lb)     Intake/Output Summary (Last 24 hours) at 05/06/12 1130 Last data filed at 05/06/12 0909  Gross per 24 hour  Intake   1100 ml  Output   1000 ml  Net    100 ml    Exam Awake Alert, Oriented X 3, No new F.N deficits, Normal affect [place].AT,PERRAL Supple Neck,No JVD, No cervical lymphadenopathy appriciated.  Symmetrical Chest wall movement, Good air movement bilaterally, CTAB RRR,No Gallops,Rubs or new Murmurs, No Parasternal Heave +ve B.Sounds, Abd Soft, Non tender, No organomegaly appriciated, No rebound - guarding or rigidity. No Cyanosis, Clubbing or edema, No new Rash or bruise     Data Review   Micro Results Recent Results (from the past 240 hour(s))   SURGICAL PCR SCREEN     Status: Abnormal   Collection Time   05/06/12  2:56 AM      Component Value Range Status Comment   MRSA, PCR NEGATIVE  NEGATIVE Final    Staphylococcus aureus POSITIVE (*) NEGATIVE Final     Radiology Reports Dg Chest 1 View  05/05/2012  *RADIOLOGY REPORT*  Clinical Data: Fall.  Hip fracture.  CHEST - 1 VIEW  Comparison: 02/25/2010  Findings: There is hyperinflation of the lungs compatible with COPD.  Heart is borderline in size.  No confluent opacities or effusions.  No acute bony abnormality.  IMPRESSION: COPD.  No acute findings.   Original Report Authenticated By: Charlett Nose, M.D.    Dg Hip Complete Right  05/05/2012  *RADIOLOGY REPORT*  Clinical Data: Fall, right hip injury.  RIGHT HIP - COMPLETE 2+ VIEW  Comparison: None.  Findings: There is a right femoral intertrochanteric fracture. Minimal displacement and angulation.  No hip dislocation or subluxation.  Prior left hip replacement.  IMPRESSION: Right femoral intertrochanteric fracture.   Original Report Authenticated By: Charlett Nose, M.D.    Dg Hip Operative Right  05/06/2012  *RADIOLOGY REPORT*  Clinical Data: Right hip fracture.  DG OPERATIVE RIGHT HIP  Comparison: None.  Findings: A sliding compression screw plate fixation device is seen transfixing an intertrochanteric right hip fracture in anatomic alignment.  IMPRESSION: Internal fixation of right hip fracture in anatomic alignment.   Original Report Authenticated By: Myles Rosenthal, M.D.    Ct Head Wo Contrast  05/05/2012  *RADIOLOGY REPORT*  Clinical Data: Fall.Dizziness.  CT HEAD WITHOUT CONTRAST  Technique:  Contiguous axial images were obtained from the base of the skull through the vertex without contrast.  Comparison: MRI 01/13/2010.  CT 01/03/2010  Findings: Age appropriate atrophy. No acute intracranial abnormality.  Specifically, no hemorrhage, hydrocephalus, mass lesion, acute infarction, or significant intracranial injury.  No acute calvarial  abnormality. Visualized paranasal sinuses and mastoids clear.  Orbital soft tissues unremarkable.  IMPRESSION: No acute intracranial abnormality.   Original Report Authenticated By: Charlett Nose, M.D.     CBC  Lab 05/06/12 0610 05/05/12 1705  WBC 8.2 13.5*  HGB 9.7* 10.4*  HCT 30.5* 31.8*  PLT 252 314  MCV 87.9 86.6  MCH 28.0 28.3  MCHC  31.8 32.7  RDW 13.8 13.8  LYMPHSABS -- 1.2  MONOABS -- 0.7  EOSABS -- 0.1  BASOSABS -- 0.0  BANDABS -- --    Chemistries   Lab 05/06/12 0610 05/05/12 1705  NA 142 138  K 4.8 4.2  CL 108 101  CO2 25 24  GLUCOSE 112* 91  BUN 37* 39*  CREATININE 1.66* 1.73*  CALCIUM 8.6 9.1  MG -- --  AST -- --  ALT -- --  ALKPHOS -- --  BILITOT -- --   ------------------------------------------------------------------------------------------------------------------ CrCl is unknown because both a height and weight (above a minimum accepted value) are required for this calculation. ------------------------------------------------------------------------------------------------------------------ No results found for this basename: HGBA1C:2 in the last 72 hours ------------------------------------------------------------------------------------------------------------------ No results found for this basename: CHOL:2,HDL:2,LDLCALC:2,TRIG:2,CHOLHDL:2,LDLDIRECT:2 in the last 72 hours ------------------------------------------------------------------------------------------------------------------ No results found for this basename: TSH,T4TOTAL,FREET3,T3FREE,THYROIDAB in the last 72 hours ------------------------------------------------------------------------------------------------------------------ No results found for this basename: VITAMINB12:2,FOLATE:2,FERRITIN:2,TIBC:2,IRON:2,RETICCTPCT:2 in the last 72 hours  Coagulation profile  Lab 05/05/12 1705  INR 1.15  PROTIME --    No results found for this basename: DDIMER:2 in the last 72 hours  Cardiac  Enzymes  Lab 05/06/12 0610 05/06/12 0016 05/05/12 1705  CKMB -- -- --  TROPONINI <0.30 <0.30 <0.30  MYOGLOBIN -- -- --   ------------------------------------------------------------------------------------------------------------------ No components found with this basename: POCBNP:3

## 2012-05-06 NOTE — Anesthesia Postprocedure Evaluation (Signed)
Anesthesia Post Note  Patient: Rachael [name]  Procedure(s) Performed: Procedure(s) (LRB): COMPRESSION HIP (Right)  Anesthesia type: general  Patient location: PACU  Post pain: Pain level controlled  Post assessment: Patient's Cardiovascular Status Stable  Last Vitals:  Filed Vitals:   05/06/12 0945  BP: 169/50  Pulse: 62  Temp:   Resp: 14    Post vital signs: Reviewed and stable  Level of consciousness: sedated  Complications: No apparent anesthesia complications

## 2012-05-07 LAB — CBC
HCT: 25.9 % — ABNORMAL LOW (ref 36.0–46.0)
MCV: 88.1 fL (ref 78.0–100.0)
RBC: 2.94 MIL/uL — ABNORMAL LOW (ref 3.87–5.11)
WBC: 11.1 10*3/uL — ABNORMAL HIGH (ref 4.0–10.5)

## 2012-05-07 LAB — BASIC METABOLIC PANEL
BUN: 40 mg/dL — ABNORMAL HIGH (ref 6–23)
CO2: 25 mEq/L (ref 19–32)
Chloride: 102 mEq/L (ref 96–112)
Creatinine, Ser: 1.72 mg/dL — ABNORMAL HIGH (ref 0.50–1.10)
Glucose, Bld: 111 mg/dL — ABNORMAL HIGH (ref 70–99)

## 2012-05-07 LAB — URINE CULTURE: Culture: NO GROWTH

## 2012-05-07 LAB — ABO/RH: ABO/RH(D): O POS

## 2012-05-07 MED ORDER — HYDROCODONE-ACETAMINOPHEN 5-325 MG PO TABS: 1.0000 | ORAL_TABLET | Freq: Four times a day (QID) | ORAL | Status: DC | PRN

## 2012-05-07 MED ORDER — HYDROCODONE-ACETAMINOPHEN 5-325 MG PO TABS
1.0000 | ORAL_TABLET | Freq: Four times a day (QID) | ORAL | Status: DC | PRN
  Administered 2012-05-07 – 2012-05-09 (×5): 1 via ORAL
  Filled 2012-05-07 (×5): qty 1

## 2012-05-07 MED ORDER — BISOPROLOL FUMARATE 5 MG PO TABS
5.0000 mg | ORAL_TABLET | Freq: Every day | ORAL | Status: DC
  Administered 2012-05-07 – 2012-05-09 (×3): 5 mg via ORAL
  Filled 2012-05-07 (×3): qty 1

## 2012-05-07 NOTE — Progress Notes (Signed)
Subjective: POD 1 ORIF R IT femur fx with DHS. Sitting up, tol PO, voiding,  sat some with PT   Objective: Vital signs in last 24 hours: Temp:  [98 F (36.7 C)-98.6 F (37 C)] 98.1 F (36.7 C) (12/15 1006) Pulse Rate:  [54-75] 73  (12/15 1006) Resp:  [15-19] 16  (12/15 1006) BP: (100-140)/(30-60) 135/51 mmHg (12/15 1006) SpO2:  [97 %-100 %] 97 % (12/15 1006) Weight:  [62.3 kg (137 lb 5.6 oz)] 62.3 kg (137 lb 5.6 oz) (12/14 1438)  Intake/Output from previous day: 12/14 0701 - 12/15 0700 In: 1340 [P.O.:240; I.V.:1100] Out: 705 [Urine:555; Blood:150] Intake/Output this shift:     Basename 05/07/12 0705 05/06/12 0610 05/05/12 1705  HGB 8.4* 9.7* 10.4*    Basename 05/07/12 0705 05/06/12 0610  WBC 11.1* 8.2  RBC 2.94* 3.47*  HCT 25.9* 30.5*  PLT 232 252    Basename 05/07/12 0705 05/06/12 0610  NA 136 142  K 4.8 4.8  CL 102 108  CO2 25 25  BUN 40* 37*  CREATININE 1.72* 1.66*  GLUCOSE 111* 112*  CALCIUM 8.3* 8.6    Basename 05/05/12 1705  LABPT --  INR 1.15    Neurovascular intact Incision: dressing C/D/I  Assessment/Plan: HGB 8.4-check in AM Continue rehab, WBAT SNF placement VTE-ASA,SCDs Pain-Hydrocodone   Hulon Ferron A. 05/07/2012, 12:29 PM

## 2012-05-07 NOTE — Evaluation (Signed)
Physical Therapy Evaluation Patient Details Name: Rachael [name] MRN: [id number] DOB: 12-07-1923 Today's Date: 05/07/2012 Time: 9629-5284 PT Time Calculation (min): 20 min  PT Assessment / Plan / Recommendation Clinical Impression  Pt admitted following syncopal fall, s/p ORIF R hip. Pt limited secondary to pain as well as dizziness upon sitting(nystagmus noted). Will continue to evaluate for possibly vestibular deficits in next session. Pt will benefit from skilled PT in the acute care setting in order to maximize functional mobility, strength and safety.     PT Assessment  Patient needs continued PT services    Follow Up Recommendations  SNF;Supervision/Assistance - 24 hour    Does the patient have the potential to tolerate intense rehabilitation      Barriers to Discharge        Equipment Recommendations  None recommended by PT    Recommendations for Other Services     Frequency Min 5X/week    Precautions / Restrictions Precautions Precautions: Fall Restrictions Weight Bearing Restrictions: No Other Position/Activity Restrictions: WBAT   Pertinent Vitals/Pain Pain 8/10 in hip. RN aware and meds given prior to session.       Mobility  Bed Mobility Bed Mobility: Supine to Sit;Sit to Supine;Sitting - Scoot to Edge of Bed Supine to Sit: HOB flat;With rails;1: +2 Total assist Supine to Sit: Patient Percentage: 50% Sitting - Scoot to Edge of Bed: 3: Mod assist Sit to Supine: 1: +2 Total assist;With rail;HOB elevated Sit to Supine: Patient Percentage: 50% Details for Bed Mobility Assistance: Assist with RLE as well as trunk both into sitting and supine. VC for safe technique and hand placement. Slow movements secodnary to pain. Upon sitting, pt complained of increased dizziness, beating nystagmus noted. Will evaluate pt for vestibular deficits next session. No nystagmus noted upon lying down Transfers Transfers: Sit to Stand;Stand to Sit Sit to Stand: 1: +2 Total  assist;With upper extremity assist;From bed Sit to Stand: Patient Percentage: 60% Stand to Sit: 1: +2 Total assist;With upper extremity assist;To bed Stand to Sit: Patient Percentage: 60% Details for Transfer Assistance: +2 assistance for stability. Cues for safe hand placement and technique with increased weightbearing through LLE. Increased time comnpleted to achieve full stand. Upon standing, pt with increased pain and requested to return to bed Ambulation/Gait Ambulation/Gait Assistance: Not tested (comment)    Shoulder Instructions     Exercises General Exercises - Lower Extremity Ankle Circles/Pumps: AROM;Strengthening;Both;10 reps;Supine Quad Sets: Strengthening;AAROM;Right;10 reps;Supine   PT Diagnosis: Difficulty walking;Acute pain  PT Problem List: Decreased strength;Decreased range of motion;Decreased activity tolerance;Decreased mobility;Decreased knowledge of use of DME;Decreased safety awareness;Decreased knowledge of precautions;Pain PT Treatment Interventions: DME instruction;Gait training;Stair training;Functional mobility training;Therapeutic activities;Therapeutic exercise;Patient/family education   PT Goals Acute Rehab PT Goals PT Goal Formulation: With patient Time For Goal Achievement: 05/21/12 Potential to Achieve Goals: Good Pt will go Supine/Side to Sit: with min assist PT Goal: Supine/Side to Sit - Progress: Goal set today Pt will go Sit to Supine/Side: with min assist PT Goal: Sit to Supine/Side - Progress: Goal set today Pt will go Sit to Stand: with supervision PT Goal: Sit to Stand - Progress: Goal set today Pt will go Stand to Sit: with supervision PT Goal: Stand to Sit - Progress: Goal set today Pt will Transfer Bed to Chair/Chair to Bed: with supervision PT Transfer Goal: Bed to Chair/Chair to Bed - Progress: Goal set today Pt will Ambulate: 51 - 150 feet;with supervision;with least restrictive assistive device PT Goal: Ambulate - Progress: Goal set  today  Pt will Perform Home Exercise Program: Independently PT Goal: Perform Home Exercise Program - Progress: Goal set today  Visit Information  Last PT Received On: 05/07/12 Assistance Needed: +2    Subjective Data  Patient Stated Goal: to decrease pain   Prior Functioning  Home Living Lives With: Alone Available Help at Discharge: Family;Available PRN/intermittently Type of Home: House Home Access: Stairs to enter [employer] of Steps: 1 Entrance Stairs-Rails: None Home Layout: One level Bathroom Shower/Tub: Walk-in Contractor: Standard Bathroom Accessibility: Yes How Accessible: Accessible via walker Home Adaptive Equipment: Walker - rolling;Shower chair without back;Grab bars around toilet;Grab bars in shower;Bedside commode/3-in-1 Prior Function Level of Independence: Independent with assistive device(s) Able to Take Stairs?: Yes Driving: Yes (was driving prior to first hip fx) Vocation: Retired Musician: No difficulties Dominant Hand: Right    Cognition  Overall Cognitive Status: Appears within functional limits for tasks assessed/performed Arousal/Alertness: Awake/alert Orientation Level: Appears intact for tasks assessed Behavior During Session: [facility] for tasks performed    Extremity/Trunk Assessment Right Lower Extremity Assessment RLE ROM/Strength/Tone: Deficits;Unable to fully assess;Due to pain RLE ROM/Strength/Tone Deficits: pain with all movement of legs. Knee and Ankle WFL RLE Sensation: WFL - Light Touch Left Lower Extremity Assessment LLE ROM/Strength/Tone: Deficits LLE ROM/Strength/Tone Deficits: Grossly 4/5 LLE Sensation: WFL - Light Touch   Balance    End of Session PT - End of Session Equipment Utilized During Treatment: Gait belt Activity Tolerance: Patient limited by pain Patient left: in bed;with call bell/phone within reach Nurse Communication: Mobility status  GP     Milana Kidney 05/07/2012, 10:57 AM  05/07/2012 Milana Kidney DPT PAGER: [phone] OFFICE: [phone]

## 2012-05-07 NOTE — Progress Notes (Signed)
Orthopedic Tech Progress Note Patient Details:  Rachael [name] 20-Jun-1923 [id number]   TRAPEZE BAR    Cammer, Mickie Bail 05/07/2012, 12:03 PM

## 2012-05-07 NOTE — Progress Notes (Signed)
  Echocardiogram 2D Echocardiogram has been performed.  Rachael [name] 05/07/2012, 11:45 AM

## 2012-05-08 ENCOUNTER — Encounter (HOSPITAL_COMMUNITY): Payer: Self-pay | Admitting: Orthopedic Surgery

## 2012-05-08 DIAGNOSIS — N179 Acute kidney failure, unspecified: Secondary | ICD-10-CM

## 2012-05-08 LAB — BASIC METABOLIC PANEL
BUN: 33 mg/dL — ABNORMAL HIGH (ref 6–23)
CO2: 26 mEq/L (ref 19–32)
Calcium: 8.5 mg/dL (ref 8.4–10.5)
Chloride: 104 mEq/L (ref 96–112)
Creatinine, Ser: 1.33 mg/dL — ABNORMAL HIGH (ref 0.50–1.10)
Glucose, Bld: 113 mg/dL — ABNORMAL HIGH (ref 70–99)

## 2012-05-08 LAB — CBC
HCT: 27.7 % — ABNORMAL LOW (ref 36.0–46.0)
MCH: 28.2 pg (ref 26.0–34.0)
MCHC: 32.1 g/dL (ref 30.0–36.0)
MCV: 87.7 fL (ref 78.0–100.0)
RDW: 14 % (ref 11.5–15.5)

## 2012-05-08 NOTE — Clinical Social Work Psychosocial (Signed)
     Clinical Social Work Department BRIEF PSYCHOSOCIAL ASSESSMENT 05/08/2012  Patient:  Rachael [name], Rachael [name]     Account Number:  [id number]     Admit date:  05/05/2012  Clinical Social Worker:  Hulan Fray  Date/Time:  05/08/2012 10:17 AM  Referred by:  Physician  Date Referred:  05/07/2012 Referred for  SNF Placement   Other Referral:   Interview type:  Patient Other interview type:    PSYCHOSOCIAL DATA Living Status:  ALONE Admitted from facility:   Level of care:   Primary support name:  Hermenia Fiscal [phone] [facility]) Primary support relationship to patient:  FRIEND Degree of support available:   supportive    CURRENT CONCERNS Current Concerns  Post-Acute Placement   Other Concerns:    SOCIAL WORK ASSESSMENT / PLAN Clinical Social Worker received referral for SNF placement at discharge. CSW introduced self and explained reason for visit. Patient reported that she is agreeable to SNF and prefers [facility] as she was just discharged from that facility. Patient reported that she was receiving rehab in November and completed her therapy within ten days. CSW encouraged patient to think of an alternative facility, but she reported that she does not want any other facility but [facility]. CSW will initiate FL2 for MD's siganture and update patient when bed offers are made.   Assessment/plan status:  Psychosocial Support/Ongoing Assessment of Needs Other assessment/ plan:   Information/referral to community resources:   SNF packet    PATIENTS/FAMILYS RESPONSE TO PLAN OF CARE: Patient repeatedly stated that she wants [facility] SN[facility] at discharge.

## 2012-05-08 NOTE — Progress Notes (Signed)
Physical Therapy Treatment Patient Details Name: Rachael [name] MRN: [id number] DOB: 11-Feb-1924 Today's Date: 05/08/2012 Time: 1478-2956 PT Time Calculation (min): 31 min  PT Assessment / Plan / Recommendation Comments on Treatment Session  78 yo adm s/p syncope, fall, and Rt hip fx with ORIF. Pt denied dizziness this date and no nystagmus noted with position changes. Pt hesitant to move either LE due to pain/stiffness and encouraged to incr AROM to decr pain.    Follow Up Recommendations  SNF;Supervision/Assistance - 24 hour                 Equipment Recommendations  None recommended by PT    Recommendations for Other Services    Frequency Min 5X/week   Plan Discharge plan remains appropriate;Frequency remains appropriate    Precautions / Restrictions Precautions Precautions: Fall Precaution Comments: second episode of syncope in 5 weeks with fall and hip fx (Lt, now Rt) Restrictions Weight Bearing Restrictions: Yes RLE Weight Bearing: Weight bearing as tolerated LLE Weight Bearing: Weight bearing as tolerated Other Position/Activity Restrictions: Lt hip precautions from hemiarthroplasty 5 weeks ago   Pertinent Vitals/Pain 5/10 Rt hip; premedicated prior to session; repositioned    Mobility  Bed Mobility Bed Mobility: Supine to Sit;Sitting - Scoot to Edge of Bed Supine to Sit: HOB elevated;1: +2 Total assist (HOB 15) Supine to Sit: Patient Percentage: 50% Sitting - Scoot to Edge of Bed: 3: Mod assist Details for Bed Mobility Assistance: Pt requiring assist with RLE and to raise torso (due to Rt hip pain); use of pad under pelvis to assist with seated scooting Transfers Transfers: Sit to Stand;Stand to Sit Sit to Stand: From bed;Without upper extremity assist;1: +2 Total assist;From elevated surface Sit to Stand: Patient Percentage: 70% Stand to Sit: To chair/3-in-1;With armrests;With upper extremity assist;1: +2 Total assist;To elevated surface Stand to Sit:  Patient Percentage: 70% Details for Transfer Assistance: cues for correct/safe use of RW; assist to translate forward over BOS Ambulation/Gait Ambulation/Gait Assistance: 1: +2 Total assist Ambulation/Gait: Patient Percentage: 70% Ambulation Distance (Feet): 2 Feet Assistive device: Rolling walker Ambulation/Gait Assistance Details: vc for weightshifting and use of bil UEs to offload RLE as trying to step LLE forward; pt taking very small steps with Lt and occasionally "scooting" Lt foot along floor due to RLE pain Gait Pattern: Step-to pattern;Decreased stride length;Decreased weight shift to right;Right foot flat;Left foot flat;Shuffle;Trunk flexed    Exercises General Exercises - Lower Extremity Ankle Circles/Pumps: AROM;Both;15 reps;Supine Short Arc Quad: AAROM;Both;10 reps;Supine Heel Slides: AAROM;AROM;Right;Left;5 reps;Supine Hip ABduction/ADduction: AAROM;Right;5 reps;Supine       PT Goals Acute Rehab PT Goals Pt will go Supine/Side to Sit: with min assist PT Goal: Supine/Side to Sit - Progress: Progressing toward goal Pt will go Sit to Stand: with supervision PT Goal: Sit to Stand - Progress: Progressing toward goal Pt will go Stand to Sit: with supervision PT Goal: Stand to Sit - Progress: Progressing toward goal Pt will Ambulate: 51 - 150 feet;with supervision;with least restrictive assistive device PT Goal: Ambulate - Progress: Progressing toward goal Pt will Perform Home Exercise Program: Independently PT Goal: Perform Home Exercise Program - Progress: Progressing toward goal  Visit Information  Last PT Received On: 05/08/12 Assistance Needed: +2    Subjective Data  Subjective: "Where were you yesterday?" re: not having PT on [place] Patient Stated Goal: to decrease pain   Cognition  Overall Cognitive Status: Appears within functional limits for tasks assessed/performed Arousal/Alertness: Awake/alert Orientation Level: Appears intact for tasks assessed Behavior  During Session: [facility] for tasks performed        End of Session PT - End of Session Equipment Utilized During Treatment: Gait belt Activity Tolerance: Patient limited by pain Patient left: in chair;with call bell/phone within reach;with family/visitor present Nurse Communication: Mobility status   GP     Kenner Lewan 05/08/2012, 2:19 PM Pager [phone]

## 2012-05-08 NOTE — Progress Notes (Signed)
[facility]                                                                                Patient Demographics  Rachael [name], is a 78 y.o. female  RUE:[id number]  JYN:[id number]  DOB - May 08, 1924  Admit date - 05/05/2012  Admitting Physician Clydia Llano, MD  Outpatient Primary MD for the patient is Nadean Corwin, MD  LOS - 3   Chief Complaint  Patient presents with  . Fall        Assessment & Plan      1. Syncope with fall causing right-sided intertrochanteric femoral fracture.- As tolerated open reduction and internal fixation done by Dr. Janee Morn on 05/06/2012 well, this is a second syncopal episode with femoral fracture in the last few months, we'll do syncopal workup including echo gram which is pending, stable carotid duplex, get orthostatics as she is able to stand now , and a new telemetry monitoring. Physical therapy, weightbearing and DVT prophylaxis per orthopedics, for now aspirin and SCDs  per orthopedics.  Social worker consulted to facilitate placement on 05/06/2012   2. Recurrent syncope. Workup as indicated above.     3. History of hypothyroidism and hypertension continue home medications no acute issues.    4. Chronic kidney disease stage III. Baseline creatinine is around 1.6, she is stable no acute issues.    Code Status: Full  Family Communication: D/W Patient and her nephew  Disposition Plan: TBD   Procedures ORIF for R.Femur fracture   Consults  Orthopedics   DVT Prophylaxis  ASA - SCDs per orthopedics  Lab Results  Component Value Date   PLT 234 05/08/2012    Medications  Scheduled Meds:    . aspirin EC  325 mg Oral Q breakfast  . bisoprolol  5 mg Oral Daily  . Chlorhexidine Gluconate Cloth  6 each Topical Daily  . docusate sodium  100 mg Oral BID  . feeding supplement  237 mL Oral BID BM  . levothyroxine  187.5 mcg Oral QAC breakfast  . multivitamin with minerals  1 tablet Oral Daily   . mupirocin ointment  1 application Nasal BID  . polyethylene glycol  17 g Oral Daily   Continuous Infusions:  PRN Meds:.HYDROcodone-acetaminophen, magnesium hydroxide, morphine injection, ondansetron (ZOFRAN) IV  Antibiotics    Anti-infectives     Start     Dose/Rate Route Frequency Ordered Stop   05/06/12 1400   ceFAZolin (ANCEF) IVPB 2 g/50 mL premix        2 g 100 mL/hr over 30 Minutes Intravenous Every 6 hours 05/06/12 1059 05/06/12 2146   05/06/12 0003   ceFAZolin (ANCEF) IVPB 2 g/50 mL premix        2 g 100 mL/hr over 30 Minutes Intravenous 60 min pre-op 05/06/12 0003 05/06/12 0758           Time Spent in minutes   35   SINGH,PRASHANT K M.D on 05/08/2012 at 9:40 AM  Between 7am to 7pm - Pager - [phone]  After 7pm go to [email or web address] - password [email or web address]  And look for the night coverage person covering for me after hours  [facility] Office  [phone]    Subjective:   Rachael [name] today has, No headache, No chest pain, No abdominal pain - No Nausea, No new weakness tingling or numbness, No Cough - SOB. Some postop right-sided hip pain.  Objective:   Filed Vitals:   05/07/12 1449 05/07/12 2158 05/08/12 0500 05/08/12 0800  BP: 131/41 132/35 147/47   Pulse: 66 71 72   Temp: 98.2 F (36.8 C) 98.1 F (36.7 C) 98.5 F (36.9 C)   TempSrc: Oral Oral Oral   Resp: 16 20 20 18   Height:      Weight:      SpO2: 98% 93% 96% 96%    Wt Readings from Last 3 Encounters:  05/06/12 62.3 kg (137 lb 5.6 oz)  05/06/12 62.3 kg (137 lb 5.6 oz)  04/01/12 62.3 kg (137 lb 5.6 oz)     Intake/Output Summary (Last 24 hours) at 05/08/12 0940 Last data filed at 05/08/12 0604  Gross per 24 hour  Intake    720 ml  Output   1600 ml  Net   -880 ml    Exam Awake Alert, Oriented X 3, No new F.N deficits, Normal affect [place].AT,PERRAL Supple Neck,No JVD, No cervical lymphadenopathy appriciated.  Symmetrical Chest wall movement, Good air movement  bilaterally, CTAB RRR,No Gallops,Rubs or new Murmurs, No Parasternal Heave +ve B.Sounds, Abd Soft, Non tender, No organomegaly appriciated, No rebound - guarding or rigidity. No Cyanosis, Clubbing or edema, No new Rash or bruise     Data Review   Micro Results Recent Results (from the past 240 hour(s))  URINE CULTURE     Status: Normal   Collection Time   05/06/12  2:54 AM      Component Value Range Status Comment   Specimen Description URINE, CATHETERIZED   Final    Special Requests NONE   Final    Culture  Setup Time 05/06/2012 14:04   Final    Colony Count NO GROWTH   Final    Culture NO GROWTH   Final    Report Status 05/07/2012 FINAL   Final   SURGICAL PCR SCREEN     Status: Abnormal   Collection Time   05/06/12  2:56 AM      Component Value Range Status Comment   MRSA, PCR NEGATIVE  NEGATIVE Final    Staphylococcus aureus POSITIVE (*) NEGATIVE Final     Radiology Reports Dg Chest 1 View  05/05/2012  *RADIOLOGY REPORT*  Clinical Data: Fall.  Hip fracture.  CHEST - 1 VIEW  Comparison: 02/25/2010  Findings: There is hyperinflation of the lungs compatible with COPD.  Heart is borderline in size.  No confluent opacities or effusions.  No acute bony abnormality.  IMPRESSION: COPD.  No acute findings.   Original Report Authenticated By: Charlett Nose, M.D.    Dg Hip Complete Right  05/05/2012  *RADIOLOGY REPORT*  Clinical Data: Fall, right hip injury.  RIGHT HIP - COMPLETE 2+ VIEW  Comparison: None.  Findings: There is a right femoral intertrochanteric fracture. Minimal displacement and angulation.  No hip dislocation or subluxation.  Prior left hip replacement.  IMPRESSION: Right femoral intertrochanteric fracture.   Original Report Authenticated By: Charlett Nose, M.D.    Dg Hip Operative Right  05/06/2012  *RADIOLOGY REPORT*  Clinical Data: Right hip fracture.  DG OPERATIVE RIGHT HIP  Comparison: None.  Findings: A sliding compression screw plate fixation device is seen  transfixing an intertrochanteric right hip fracture in anatomic alignment.  IMPRESSION: Internal fixation of right  hip fracture in anatomic alignment.   Original Report Authenticated By: Myles Rosenthal, M.D.    Ct Head Wo Contrast  05/05/2012  *RADIOLOGY REPORT*  Clinical Data: Fall.Dizziness.  CT HEAD WITHOUT CONTRAST  Technique:  Contiguous axial images were obtained from the base of the skull through the vertex without contrast.  Comparison: MRI 01/13/2010.  CT 01/03/2010  Findings: Age appropriate atrophy. No acute intracranial abnormality.  Specifically, no hemorrhage, hydrocephalus, mass lesion, acute infarction, or significant intracranial injury.  No acute calvarial abnormality. Visualized paranasal sinuses and mastoids clear.  Orbital soft tissues unremarkable.  IMPRESSION: No acute intracranial abnormality.   Original Report Authenticated By: Charlett Nose, M.D.     CBC  Lab 05/08/12 0744 05/07/12 0705 05/06/12 0610 05/05/12 1705  WBC 9.5 11.1* 8.2 13.5*  HGB 8.9* 8.4* 9.7* 10.4*  HCT 27.7* 25.9* 30.5* 31.8*  PLT 234 232 252 314  MCV 87.7 88.1 87.9 86.6  MCH 28.2 28.6 28.0 28.3  MCHC 32.1 32.4 31.8 32.7  RDW 14.0 14.0 13.8 13.8  LYMPHSABS -- -- -- 1.2  MONOABS -- -- -- 0.7  EOSABS -- -- -- 0.1  BASOSABS -- -- -- 0.0  BANDABS -- -- -- --    Chemistries   Lab 05/08/12 0744 05/07/12 0705 05/06/12 0610 05/05/12 1705  NA 137 136 142 138  K 4.7 4.8 4.8 4.2  CL 104 102 108 101  CO2 26 25 25 24   GLUCOSE 113* 111* 112* 91  BUN 33* 40* 37* 39*  CREATININE 1.33* 1.72* 1.66* 1.73*  CALCIUM 8.5 8.3* 8.6 9.1  MG -- -- -- --  AST -- -- -- --  ALT -- -- -- --  ALKPHOS -- -- -- --  BILITOT -- -- -- --   ------------------------------------------------------------------------------------------------------------------ estimated creatinine clearance is 29.9 ml/min (by C-G formula based on Cr of  1.33). ------------------------------------------------------------------------------------------------------------------ No results found for this basename: HGBA1C:2 in the last 72 hours ------------------------------------------------------------------------------------------------------------------ No results found for this basename: CHOL:2,HDL:2,LDLCALC:2,TRIG:2,CHOLHDL:2,LDLDIRECT:2 in the last 72 hours ------------------------------------------------------------------------------------------------------------------ No results found for this basename: TSH,T4TOTAL,FREET3,T3FREE,THYROIDAB in the last 72 hours ------------------------------------------------------------------------------------------------------------------ No results found for this basename: VITAMINB12:2,FOLATE:2,FERRITIN:2,TIBC:2,IRON:2,RETICCTPCT:2 in the last 72 hours  Coagulation profile  Lab 05/05/12 1705  INR 1.15  PROTIME --    No results found for this basename: DDIMER:2 in the last 72 hours  Cardiac Enzymes  Lab 05/06/12 0610 05/06/12 0016 05/05/12 1705  CKMB -- -- --  TROPONINI <0.30 <0.30 <0.30  MYOGLOBIN -- -- --   ------------------------------------------------------------------------------------------------------------------ No components found with this basename: POCBNP:3

## 2012-05-08 NOTE — Progress Notes (Signed)
Foley d/c'd

## 2012-05-08 NOTE — Clinical Social Work Placement (Addendum)
    Clinical Social Work Department CLINICAL SOCIAL WORK PLACEMENT NOTE 05/08/2012  Patient:  Rachael [name], Rachael [name]  Account Number:  [id number] Admit date:  05/05/2012  Clinical Social Worker:  Hulan Fray  Date/time:  05/08/2012 10:45 AM  Clinical Social Work is seeking post-discharge placement for this patient at the following level of care:   SKILLED NURSING   (*CSW will update this form in Epic as items are completed)   05/08/2012  Patient/family provided with [facility] Department of Clinical Social Work's list of facilities offering this level of care within the geographic area requested by the patient (or if unable, by the patient's family).  05/08/2012  Patient/family informed of their freedom to choose among providers that offer the needed level of care, that participate in Medicare, Medicaid or managed care program needed by the patient, have an available bed and are willing to accept the patient.  05/08/2012  Patient/family informed of [facility]' ownership interest in [facility], as well as of the fact that they are under no obligation to receive care at this facility.  PASARR submitted to EDS on existing PASARR number received from EDS on existing  FL2 transmitted to all facilities in geographic area requested by pt/family on  05/08/2012 FL2 transmitted to all facilities within larger geographic area on   Patient informed that his/her managed care company has contracts with or will negotiate with  certain facilities, including the following:     Patient/family informed of bed offers received:  05/08/12 Patient chooses bed at [facility] Physician recommends and patient chooses bed at    Patient to be transferred to [facility] on   Patient to be transferred to facility by   The following physician request were entered in Epic:   Additional Comments:

## 2012-05-08 NOTE — Progress Notes (Signed)
Subjective: POD 2 ORIF R IT femur fx No c/o   Objective: Vital signs in last 24 hours: Temp:  [98.1 F (36.7 C)-98.5 F (36.9 C)] 98.5 F (36.9 C) (12/16 0500) Pulse Rate:  [66-75] 72  (12/16 0500) Resp:  [16-20] 20  (12/16 0500) BP: (131-147)/(35-60) 147/47 mmHg (12/16 0500) SpO2:  [93 %-98 %] 96 % (12/16 0500)  Intake/Output from previous day: 12/15 0701 - 12/16 0700 In: 1560 [P.O.:1560] Out: 1600 [Urine:1600] Intake/Output this shift:     Basename 05/07/12 0705 05/06/12 0610 05/05/12 1705  HGB 8.4* 9.7* 10.4*    Basename 05/07/12 0705 05/06/12 0610  WBC 11.1* 8.2  RBC 2.94* 3.47*  HCT 25.9* 30.5*  PLT 232 252    Basename 05/07/12 0705 05/06/12 0610  NA 136 142  K 4.8 4.8  CL 102 108  CO2 25 25  BUN 40* 37*  CREATININE 1.72* 1.66*  GLUCOSE 111* 112*  CALCIUM 8.3* 8.6    Basename 05/05/12 1705  LABPT --  INR 1.15    Neurovascular intact Incision: no drainage  Assessment/Plan: Continue pathway Hbg pending-8.4 yday WBAT VTE-SCDs/ASA Pain-hydrocodone SNF placement once medically cleared for d/c and facility obtained. F/u 2 weeks postop with me.   Florabelle Cardin A. 05/08/2012, 7:07 AM

## 2012-05-09 MED ORDER — DSS 100 MG PO CAPS: 100.0000 mg | ORAL_CAPSULE | Freq: Two times a day (BID) | ORAL | Status: DC

## 2012-05-09 MED ORDER — HYDRALAZINE HCL 25 MG PO TABS: 25.0000 mg | ORAL_TABLET | Freq: Three times a day (TID) | ORAL | Status: DC

## 2012-05-09 MED ORDER — POLYETHYLENE GLYCOL 3350 17 G PO PACK: 17.0000 g | PACK | Freq: Every day | ORAL | Status: DC | PRN

## 2012-05-09 NOTE — Progress Notes (Signed)
Subjective: POD 3 ORIF R IT femur fx No c/o   Objective: Vital signs in last 24 hours: Temp:  [98.2 F (36.8 C)-98.4 F (36.9 C)] 98.2 F (36.8 C) (12/17 0447) Pulse Rate:  [67-81] 77  (12/17 0936) Resp:  [18-19] 18  (12/17 0800) BP: (151-172)/(30-62) 151/46 mmHg (12/17 0936) SpO2:  [96 %-98 %] 98 % (12/17 0800)  Intake/Output from previous day: 12/16 0701 - 12/17 0700 In: 600 [P.O.:600] Out: 700 [Urine:700] Intake/Output this shift:     Basename 05/08/12 0744 05/07/12 0705  HGB 8.9* 8.4*    Basename 05/08/12 0744 05/07/12 0705  WBC 9.5 11.1*  RBC 3.16* 2.94*  HCT 27.7* 25.9*  PLT 234 232    Basename 05/08/12 0744 05/07/12 0705  NA 137 136  K 4.7 4.8  CL 104 102  CO2 26 25  BUN 33* 40*  CREATININE 1.33* 1.72*  GLUCOSE 113* 111*  CALCIUM 8.5 8.3*   No results found for this basename: LABPT:2,INR:2 in the last 72 hours  Neurovascular intact Incision: no drainage  Assessment/Plan: Continue pathway Hbg pending-8.9 yday, stable WBAT VTE-SCDs/ASA Pain-hydrocodone SNF placement once medically cleared for d/c and facility obtained. F/u 2 weeks postop with me.   Haadiya Frogge A. 05/09/2012, 11:36 AM

## 2012-05-09 NOTE — Progress Notes (Signed)
Physical Therapy Treatment Patient Details Name: GISELLE BRUTUS MRN: [id number] DOB: [birth year] Today's Date: 05/09/2012 Time: 2952-8413 PT Time Calculation (min): 24 min  PT Assessment / Plan / Recommendation Comments on Treatment Session  Pt making progress with mobilty & PT goals at this date.  Able to increase ambulation distance & required decreased (A) for all components of session.      Follow Up Recommendations  SNF;Supervision/Assistance - 24 hour     Does the patient have the potential to tolerate intense rehabilitation     Barriers to Discharge        Equipment Recommendations  None recommended by PT    Recommendations for Other Services    Frequency Min 5X/week   Plan Discharge plan remains appropriate;Frequency remains appropriate    Precautions / Restrictions Precautions Precautions: Fall Restrictions Weight Bearing Restrictions: Yes RLE Weight Bearing: Weight bearing as tolerated LLE Weight Bearing: Weight bearing as tolerated   Pertinent Vitals/Pain 5/10  Rt LE.      Mobility  Bed Mobility Bed Mobility: Supine to Sit;Sitting - Scoot to Edge of Bed Supine to Sit: 2: Max assist;HOB flat;With rails Sitting - Scoot to Edge of Bed: 4: Min assist;Other (comment) (use of draw pad) Details for Bed Mobility Assistance: Cues for sequencing & technique.  (A) for LE's & to lift shoulders/trunk to sitting upright.   Transfers Transfers: Sit to Stand;Stand to Sit Sit to Stand: 3: Mod assist;With upper extremity assist;From bed Stand to Sit: 4: Min assist;With upper extremity assist;With armrests;To chair/3-in-1 Details for Transfer Assistance: Cues for hand placement & technique.  (A) to achieve standing, anterior translation of trunk over BOS, balance, & controlled descent.   Ambulation/Gait Ambulation/Gait Assistance: 4: Min assist Ambulation Distance (Feet): 15 Feet Assistive device: Rolling walker Ambulation/Gait Assistance Details: Cues for sequencing, safe  RW advancement, use of UE's to off load weight from R LE during L LE swing phase.   Gait Pattern: Step-to pattern;Decreased stance time - right;Decreased step length - left;Decreased step length - right (dcreased step height) General Gait Details: Pt with no reports of dizziness Stairs: No Wheelchair Mobility Wheelchair Mobility: No    Exercises General Exercises - Lower Extremity Ankle Circles/Pumps: AROM;Both;5 reps Long Arc Quad: AAROM;Both;5 reps Heel Slides: AAROM;Both;5 reps Hip ABduction/ADduction: AAROM;Both;5 reps;Strengthening Hip Flexion/Marching: AROM;AAROM;5 reps;Both (AAROM R LE) Toe Raises: AROM;Strengthening;Both;5 reps     PT Goals Acute Rehab PT Goals Time For Goal Achievement: 05/21/12 Potential to Achieve Goals: Good Pt will go Supine/Side to Sit: with min assist PT Goal: Supine/Side to Sit - Progress: Progressing toward goal Pt will go Sit to Supine/Side: with min assist Pt will go Sit to Stand: with supervision PT Goal: Sit to Stand - Progress: Progressing toward goal Pt will go Stand to Sit: with supervision PT Goal: Stand to Sit - Progress: Progressing toward goal Pt will Transfer Bed to Chair/Chair to Bed: with supervision Pt will Ambulate: 51 - 150 feet;with supervision;with least restrictive assistive device PT Goal: Ambulate - Progress: Progressing toward goal Pt will Perform Home Exercise Program: Independently PT Goal: Perform Home Exercise Program - Progress: Progressing toward goal  Visit Information  Last PT Received On: 05/09/12 Assistance Needed: +1    Subjective Data  Subjective: "Im scared of falling" Patient Stated Goal: to decrease pain   Cognition  Overall Cognitive Status: Appears within functional limits for tasks assessed/performed Arousal/Alertness: Awake/alert Orientation Level: Appears intact for tasks assessed Behavior During Session: [facility] for tasks performed    Balance  End of Session PT - End of Session Equipment  Utilized During Treatment: Gait belt Activity Tolerance: Patient tolerated treatment well Patient left: in chair;with call bell/phone within reach Nurse Communication: Mobility status    Verdell Face, [place] [phone] 05/09/2012

## 2012-05-09 NOTE — Discharge Summary (Addendum)
[facility]                                                                                   Rachael [name], is a 78 y.o. female  DOB 05-18-1924  MRN [id number].  Admission date:  05/05/2012  Discharge Date:  05/09/2012  Primary MD  Nadean Corwin, MD  Admitting Physician  Clydia Llano, MD  Admission Diagnosis  Unspecified essential hypertension [401.9] Hip fracture [820.8] CKD (chronic kidney disease), stage III [585.3] Near syncope [780.2] fall, Hip fracture  Discharge Diagnosis     Principal Problem:  *Intertrochanteric fracture of right hip Active Problems:  HYPOTHYROIDISM  HYPERTENSION  CKD (chronic kidney disease), stage III   Past Medical History  Diagnosis Date  . Thyroid disease   . Hypertension   . Senile osteoporosis     Past Surgical History  Procedure Date  . Inguinal hernia repair   . Tonsillectomy   . Hip arthroplasty 04/01/2012    Procedure: ARTHROPLASTY BIPOLAR HIP;  Surgeon: [name], MD;  Location: [facility] OR[facility];  Service: Orthopedics;  Laterality: Left;  . Compression hip screw 05/06/2012    Procedure: COMPRESSION HIP;  Surgeon: Jodi Marble, MD;  Location: [facility];  Service: Orthopedics;  Laterality: Right;    Discharge Diagnoses:   Principal Problem:  *Intertrochanteric fracture of right hip Active Problems:  HYPOTHYROIDISM  HYPERTENSION  CKD (chronic kidney disease), stage III    Discharge Condition: Stable   Diet recommendation: See Discharge Instructions below   Consults orthopedics    History of present illness and  Hospital Course:  See H&P, Labs, Consult and Test reports for all details in brief, patient was admitted for    1. Syncope with fall causing right-sided intertrochanteric femoral fracture.-  has tolerated open reduction and internal fixation done by Dr. Janee Morn on 05/06/2012 continue, Physical therapy, weightbearing as tolerated and ASA for DVT prophylaxis per orthopedics.      2. Recurrent syncope. Workup stable here, not orthostatics, carotids stable, tele stable, Echo no acute findings, outpt follow with cardiologist in 7-10 days recommended.    3. History of hypothyroidism and hypertension - BP meds adjusted due to mild orthostasis on admission and creat 1.7, both better, ACE held for now.    4.Chronic kidney disease stage III. Baseline creatinine is around 1.6, she is stable no acute issues.      5.Stable Chr Diastolic CHF per echo.    Today   Subjective:   Rachael [name] today has no headache,no chest abdominal pain,no new weakness tingling or numbness, feels much better.  Objective:   Blood pressure 151/46, pulse 77, temperature 98.2 F (36.8 C), temperature source Oral, resp. rate 18, height 5' 8.5" (1.74 m), weight 62.3 kg (137 lb 5.6 oz), SpO2 98.00%.   Intake/Output Summary (Last 24 hours) at 05/09/12 1001 Last data filed at 05/08/12 1410  Gross per 24 hour  Intake    360 ml  Output    700 ml  Net   -340 ml    Exam Awake Alert, Oriented *3, No new F.N deficits, Normal affect .AT,PERRAL Supple Neck,No JVD, No cervical lymphadenopathy appriciated.  Symmetrical Chest wall movement,  Good air movement bilaterally, CTAB RRR,No Gallops,Rubs or new Murmurs, No Parasternal Heave +ve B.Sounds, Abd Soft, Non tender, No organomegaly appriciated, No rebound -guarding or rigidity. No Cyanosis, Clubbing or edema, No new Rash or bruise  Data Review   Major procedures and Radiology Reports - PLEASE review detailed and final reports for all details in brief -   Echo  - Left ventricle: The cavity size was normal. Systolic function was vigorous. The estimated ejection fraction was in the range of 65% to 70%. Wall motion was normal; there were no regional wall motion abnormalities. Doppler parameters are consistent with abnormal left ventricular relaxation (grade 1 diastolic dysfunction). - Aortic valve: There was mild stenosis. Trivial  regurgitation. Valve area: 1.32cm^2(VTI). Valve area:  1.36cm^2 (Vmax). - Mitral valve: Calcified annulus. Moderately calcified leaflets posterior. - Left atrium: The atrium was mildly dilated. - Right atrium: The atrium was mildly dilated. - Tricuspid valve: Moderate regurgitation. - Pulmonary arteries: Systolic pressure was moderately increased. PA peak pressure: 55mm Hg (S). - Pericardium, extracardiac: A trivial pericardial effusion was identified.     Dg Chest 1 View  05/05/2012  *RADIOLOGY REPORT*  Clinical Data: Fall.  Hip fracture.  CHEST - 1 VIEW  Comparison: 02/25/2010  Findings: There is hyperinflation of the lungs compatible with COPD.  Heart is borderline in size.  No confluent opacities or effusions.  No acute bony abnormality.  IMPRESSION: COPD.  No acute findings.   Original Report Authenticated By: Charlett Nose, M.D.    Dg Hip Complete Right  05/05/2012  *RADIOLOGY REPORT*  Clinical Data: Fall, right hip injury.  RIGHT HIP - COMPLETE 2+ VIEW  Comparison: None.  Findings: There is a right femoral intertrochanteric fracture. Minimal displacement and angulation.  No hip dislocation or subluxation.  Prior left hip replacement.  IMPRESSION: Right femoral intertrochanteric fracture.   Original Report Authenticated By: Charlett Nose, M.D.    Dg Hip Operative Right  05/06/2012  *RADIOLOGY REPORT*  Clinical Data: Right hip fracture.  DG OPERATIVE RIGHT HIP  Comparison: None.  Findings: A sliding compression screw plate fixation device is seen transfixing an intertrochanteric right hip fracture in anatomic alignment.  IMPRESSION: Internal fixation of right hip fracture in anatomic alignment.   Original Report Authenticated By: Myles Rosenthal, M.D.    Ct Head Wo Contrast  05/05/2012  *RADIOLOGY REPORT*  Clinical Data: Fall.Dizziness.  CT HEAD WITHOUT CONTRAST  Technique:  Contiguous axial images were obtained from the base of the skull through the vertex without contrast.  Comparison: MRI  01/13/2010.  CT 01/03/2010  Findings: Age appropriate atrophy. No acute intracranial abnormality.  Specifically, no hemorrhage, hydrocephalus, mass lesion, acute infarction, or significant intracranial injury.  No acute calvarial abnormality. Visualized paranasal sinuses and mastoids clear.  Orbital soft tissues unremarkable.  IMPRESSION: No acute intracranial abnormality.   Original Report Authenticated By: Charlett Nose, M.D.    Dg C-arm 1-60 Min  05/06/2012  *RADIOLOGY REPORT*  Clinical Data: ORIF right hip fracture  DG C-ARM 1-60 MIN  Technique: Intraoperative spot radiographs obtained during ORIF of intertrochanteric hip fracture.  Comparison:  Prior radiographs 05/05/2012  Findings: Frontal and frog-leg lateral spot radiographs demonstrate interval ORIF of the intertrochanteric fracture with adynamic hip screw and plate construct.  Alignment is improved compared to the preoperative radiographs.  No evidence of immediate hardware complication.  The femoral head is located with respect to the head.  IMPRESSION: ORIF of right intertrochanteric fracture as detailed above.   Original Report Authenticated By: Malachy Moan, M.D.  Micro Results     Recent Results (from the past 240 hour(s))  URINE CULTURE     Status: Normal   Collection Time   05/06/12  2:54 AM      Component Value Range Status Comment   Specimen Description URINE, CATHETERIZED   Final    Special Requests NONE   Final    Culture  Setup Time 05/06/2012 14:04   Final    Colony Count NO GROWTH   Final    Culture NO GROWTH   Final    Report Status 05/07/2012 FINAL   Final   SURGICAL PCR SCREEN     Status: Abnormal   Collection Time   05/06/12  2:56 AM      Component Value Range Status Comment   MRSA, PCR NEGATIVE  NEGATIVE Final    Staphylococcus aureus POSITIVE (*) NEGATIVE Final      CBC w Diff: Lab Results  Component Value Date   WBC 9.5 05/08/2012   HGB 8.9* 05/08/2012   HCT 27.7* 05/08/2012   PLT 234  05/08/2012   LYMPHOPCT 9* 05/05/2012   MONOPCT 5 05/05/2012   EOSPCT 1 05/05/2012   BASOPCT 0 05/05/2012    CMP: Lab Results  Component Value Date   NA 137 05/08/2012   K 4.7 05/08/2012   CL 104 05/08/2012   CO2 26 05/08/2012   BUN 33* 05/08/2012   CREATININE 1.33* 05/08/2012   PROT 6.6 03/31/2012   ALBUMIN 3.9 03/31/2012   BILITOT 0.6 03/31/2012   ALKPHOS 73 03/31/2012   AST 34 03/31/2012   ALT 30 03/31/2012  .   Discharge Instructions     Weightbearing as tolerated.  Discharge VTE prophylaxis resume ASA 650mg  by mouth at night Daily dry dressing changes until no active drainage and wound sealed , then may stop dressing changes.   Follow with Primary MD MCKEOWN,WILLIAM DAVID, MD in 7 days   Get CBC, CMP, checked 7 days by Primary MD and again as instructed by your Primary MD. Get a 2 view Chest X ray done next visit if you had Pneumonia of Lung problems at the Hospital.  Get Medicines reviewed and adjusted.  Please request your Prim.MD to go over all Hospital Tests and Procedure/Radiological results at the follow up, please get all Hospital records sent to your Prim MD by signing hospital release before you go home.  Activity: weight bearing As tolerated with Full fall precautions use walker/cane & assistance as needed   Diet:  Heart Healthy  For Heart failure patients - Check your Weight same time everyday, if you gain over 2 pounds, or you develop in leg swelling, experience more shortness of breath or chest pain, call your Primary MD immediately. Follow Cardiac Low Salt Diet and 1.8 lit/day fluid restriction.  Disposition SNF  If you experience worsening of your admission symptoms, develop shortness of breath, life threatening emergency, suicidal or homicidal thoughts you must seek medical attention immediately by calling 911 or calling your MD immediately  if symptoms less severe.  You Must read complete instructions/literature along with all the possible adverse  reactions/side effects for all the Medicines you take and that have been prescribed to you. Take any new Medicines after you have completely understood and accpet all the possible adverse reactions/side effects.   Do not drive and provide baby sitting services if your were admitted for syncope or siezures until you have seen by Primary MD or a Neurologist and advised to do so again.  Do  not drive when taking Pain medications.    Do not take more than prescribed Pain, Sleep and Anxiety Medications  Special Instructions: If you have smoked or chewed Tobacco  in the last 2 yrs please stop smoking, stop any regular Alcohol  and or any Recreational drug use.  Wear Seat belts while driving.   Follow-up Information    Follow up with THOMPSON, DAVID A., MD. Schedule an appointment as soon as possible for a visit in 2 weeks. ((approximately 2 weeks from day of surgery))    Contact information:   [address] [place] [address] [phone]       Follow up with Nadean Corwin, MD. Schedule an appointment as soon as possible for a visit in 1 week.   Contact information:   [address] [place] [address] [phone]       Follow up with [facility], MD. Schedule an appointment as soon as possible for a visit in 1 week.   Contact information:   [address] [place] [address] [phone]            Discharge Medications     Medication List     As of 05/09/2012 10:01 AM    Errors       There were errors while generating this report. This may mean that the report is inaccurate, possibly including missing medications. Review the chart to verify that the report is correct and complete, and annotate the report with any corrections. Also, notify your system administrator to have the problem fixed.       START taking these medications         DSS 100 MG Caps   Take 100 mg by mouth 2 (two) times daily.      hydrALAZINE 25 MG  tablet   Commonly known as: APRESOLINE   Take 1 tablet (25 mg total) by mouth 3 (three) times daily.      HYDROcodone-acetaminophen 5-325 MG per tablet   Commonly known as: NORCO/VICODIN   Take 1 tablet by mouth every 6 (six) hours as needed for pain.      polyethylene glycol packet   Commonly known as: MIRALAX / GLYCOLAX   Take 17 g by mouth daily as needed.      CONTINUE taking these medications         alendronate 70 MG tablet   Commonly known as: FOSAMAX      aspirin EC 325 MG tablet      Biotin 1000 MCG tablet      bisoprolol 10 MG tablet   Commonly known as: ZEBETA   Take 1 tablet (10 mg total) by mouth daily.      bumetanide 1 MG tablet   Commonly known as: BUMEX      levothyroxine 125 MCG tablet   Commonly known as: SYNTHROID, LEVOTHROID      multivitamin with minerals Tabs      RED YEAST RICE PO      zinc gluconate 50 MG tablet      STOP taking these medications         BENICAR PO          Where to get your medications    These are the prescriptions that you need to pick up.   You may get these medications from any pharmacy.         HYDROcodone-acetaminophen 5-325 MG per tablet         Information on where  to get these meds is not yet available. Ask your nurse or doctor.         DSS 100 MG Caps   hydrALAZINE 25 MG tablet   polyethylene glycol packet               Total Time in preparing paper work, data evaluation and todays exam - 35 minutes  Leroy Sea M.D on 05/09/2012 at 10:01 AM  [facility] Office  [phone]

## 2012-05-09 NOTE — Progress Notes (Signed)
Pt discharged to [facility] via ambulance. IV d/c'd

## 2012-05-09 NOTE — Clinical Social Work Note (Signed)
Clinical Social Worker facilitated discharge by contacting SNF, [facility] and patient. Per patient, she spoke with Hermenia Fiscal, POA and they feel that ambulance transport will be safest. CSW will complete discharge packet. Patient will be transported [facility].   Rozetta Nunnery MSW, [employer] [phone]

## 2012-05-12 ENCOUNTER — Encounter: Payer: Self-pay | Admitting: *Deleted

## 2012-05-15 ENCOUNTER — Ambulatory Visit (INDEPENDENT_AMBULATORY_CARE_PROVIDER_SITE_OTHER): Payer: Medicare Other | Admitting: Internal Medicine

## 2012-05-15 ENCOUNTER — Encounter: Payer: Self-pay | Admitting: Internal Medicine

## 2012-05-15 VITALS — BP 140/50 | HR 72 | Ht 68.75 in | Wt 136.2 lb

## 2012-05-15 DIAGNOSIS — D509 Iron deficiency anemia, unspecified: Secondary | ICD-10-CM

## 2012-05-15 MED ORDER — ESOMEPRAZOLE MAGNESIUM 40 MG PO CPDR: 40.0000 mg | DELAYED_RELEASE_CAPSULE | Freq: Every day | ORAL | Status: DC

## 2012-05-15 MED ORDER — FERROUS SULFATE 325 (65 FE) MG PO TABS: 325.0000 mg | ORAL_TABLET | Freq: Two times a day (BID) | ORAL | Status: DC

## 2012-05-15 NOTE — Progress Notes (Signed)
HISTORY OF PRESENT ILLNESS:  Rachael [name] is a 78 y.o. female with multiple medical problems who is sent today regarding iron deficiency anemia. Patient's baseline hemoglobin is 12 (August 2013). Hemoglobin 04/17/2012 was 10.6. Normal MCV. Normal B12 and folate. Low iron saturation 9%. Normal ferritin 149. Around that time she fractured her left hip and required surgery. She subsequently fractured her right femur 2 weeks ago. Now in skilled nursing facility. Accompanied today by her friend. I saw patient in November of 2008 for unexplained weight loss. See that dictation. She subsequently underwent complete colonoscopy. Was found to have severe pandiverticulosis. No other abnormalities. Her GI review of systems is currently negative. No bleeding. She does take aspirin regularly. Blood counts from the hospital a few weeks ago range between 8.4 and 9.7. Her weight is actually up 4 pounds from her evaluation in 2008.she is in a wheelchair  REVIEW OF SYSTEMS:  All non-GI ROS negative except for hip pain, hearing problems, urinary frequency  Past Medical History  Diagnosis Date  . Hypertension   . Senile osteoporosis   . Vitamin D deficiency   . Diverticulosis   . Hypothyroidism   . Atherosclerotic heart disease   . TIA (transient ischemic attack)   . CKD (chronic kidney disease)   . Inguinal hernia     Past Surgical History  Procedure Date  . Inguinal hernia repair   . Tonsillectomy   . Hip arthroplasty 04/01/2012    Procedure: ARTHROPLASTY BIPOLAR HIP;  Surgeon: [name], MD;  Location: [facility];  Service: Orthopedics;  Laterality: Left;  . Compression hip screw 05/06/2012    Procedure: COMPRESSION HIP;  Surgeon: Jodi Marble, MD;  Location: [facility] OR;  Service: Orthopedics;  Laterality: Right;  . Skin lesion excision     benignt; from shoulder  . Hip surgery     left    Social History MLISS WEDIN  reports that she quit smoking about 41 years ago. Her smoking use  included Cigarettes. She does not have any smokeless tobacco history on file. She reports that she does not drink alcohol or use illicit drugs.  family history includes CVA in her mother and Emphysema in her sister.  There is no history of Colon cancer.  No Known Allergies     PHYSICAL EXAMINATION: Vital signs: BP 140/50  Pulse 72  Ht 5' 8.75" (1.746 m)  Wt 136 lb 4 oz (61.803 kg)  BMI 20.27 kg/m2 General: Well-developed, but frail, well-nourished, no acute distress HEENT: Sclerae are anicteric, conjunctiva pink. Oral mucosa intact Lungs: Clear Heart: Regular Abdomen: soft, nontender, nondistended, no obvious ascites, no peritoneal signs, normal bowel sounds. No organomegaly. Extremities: No edema Psychiatric: alert and oriented x3. Cooperative    ASSESSMENT:  #1. Anemia. Possibly iron deficient. Negative GI review of systems. #2. Colonoscopy 2008 with severe diverticulosis #3. Multiple medical problems. Recent fracture of both legs, still recovering   PLAN:  #1. Iron sulfate 325 mg twice a day #2. Empiric treatment with PPI, to address any potential upper GI mucosal lesions. One month of Nexium 40 mg daily provided #3. Repeat CBC in 4 weeks... Not compelled to perform endoscopic evaluations in this patient at this time

## 2012-05-15 NOTE — Patient Instructions (Addendum)
You have been given you samples of Nexium to take one tablet by mouth once daily. A prescription has been printed and given to you.  We have also printed a prescription for Iron 325 mg to take one tablet by mouth twice daily.   Please go directly to the basement lab to have your Complete Blood Count checked in 4-6 weeks whish is around the week of the January 23rd, 2014. Our lab is open from 7:30am-5:30pm. The order is already in the computer system.  cc: Lucky Cowboy, MD

## 2012-06-13 ENCOUNTER — Other Ambulatory Visit (INDEPENDENT_AMBULATORY_CARE_PROVIDER_SITE_OTHER): Payer: Medicare Other

## 2012-06-13 DIAGNOSIS — D509 Iron deficiency anemia, unspecified: Secondary | ICD-10-CM

## 2012-06-13 LAB — CBC WITH DIFFERENTIAL/PLATELET
Basophils Absolute: 0.1 10*3/uL (ref 0.0–0.1)
Basophils Relative: 0.8 % (ref 0.0–3.0)
Eosinophils Absolute: 0.2 10*3/uL (ref 0.0–0.7)
Eosinophils Relative: 2.2 % (ref 0.0–5.0)
HCT: 32.6 % — ABNORMAL LOW (ref 36.0–46.0)
Hemoglobin: 10.9 g/dL — ABNORMAL LOW (ref 12.0–15.0)
Lymphocytes Relative: 21.4 % (ref 12.0–46.0)
Lymphs Abs: 1.8 10*3/uL (ref 0.7–4.0)
MCHC: 33.3 g/dL (ref 30.0–36.0)
MCV: 83.4 fl (ref 78.0–100.0)
Monocytes Absolute: 0.8 10*3/uL (ref 0.1–1.0)
Monocytes Relative: 9.2 % (ref 3.0–12.0)
Neutro Abs: 5.7 10*3/uL (ref 1.4–7.7)
Neutrophils Relative %: 66.4 % (ref 43.0–77.0)
Platelets: 311 10*3/uL (ref 150.0–400.0)
RBC: 3.91 Mil/uL (ref 3.87–5.11)
RDW: 15.4 % — ABNORMAL HIGH (ref 11.5–14.6)
WBC: 8.5 10*3/uL (ref 4.5–10.5)

## 2012-06-14 ENCOUNTER — Telehealth: Payer: Self-pay

## 2012-06-14 ENCOUNTER — Other Ambulatory Visit: Payer: Self-pay | Admitting: Internal Medicine

## 2012-06-14 DIAGNOSIS — D509 Iron deficiency anemia, unspecified: Secondary | ICD-10-CM

## 2012-06-14 DIAGNOSIS — D649 Anemia, unspecified: Secondary | ICD-10-CM

## 2012-06-14 MED ORDER — ESOMEPRAZOLE MAGNESIUM 40 MG PO CPDR: 40.0000 mg | DELAYED_RELEASE_CAPSULE | Freq: Every day | ORAL | Status: DC

## 2012-06-14 NOTE — Telephone Encounter (Signed)
Pt called and would like some Nexium samples if we have any. Let pt know I would check and call her back.  Called pt and let her know I will leave samples up front for her to pick up.

## 2012-07-18 ENCOUNTER — Telehealth: Payer: Self-pay | Admitting: Internal Medicine

## 2012-07-19 NOTE — Telephone Encounter (Signed)
Patient expressed interest in having labs drawn earlier than the future appointment that is scheduled arount 3-23 because she wants to think about getting off her Nexium.  I told her I would send Dr. Marina Goodell a message and call her back.

## 2012-07-21 ENCOUNTER — Telehealth: Payer: Self-pay

## 2012-07-21 DIAGNOSIS — D509 Iron deficiency anemia, unspecified: Secondary | ICD-10-CM

## 2012-07-21 NOTE — Telephone Encounter (Signed)
Called patient and told her that per Dr. Marina Goodell, she could stop her Nexium, continue her iron, and come in for a CBC at any time.  Patient acknowledged and agreed.

## 2012-07-21 NOTE — Telephone Encounter (Signed)
Message copied by Jeanine Luz on Fri Jul 21, 2012  9:37 AM ------      Message from: Hilarie Fredrickson      Created: Wed Jul 19, 2012  4:58 PM       She can stop Nexium now, but needs to stay on iron. Repeat CBC any time now would be fine. Convert this into a phone note for the records. JNP                  ----- Message -----         From: Jeanine Luz, CMA         Sent: 07/19/2012   2:10 PM           To: Hilarie Fredrickson, MD            Patient is due for her next CBC to be drawn, per your instructions, around 08-13-12.  Patient called and expressed interest in having those labs drawn earlier for the purpose of getting off the Nexium she is currently taking.  Please advise??  Thanks!       ------

## 2012-07-31 ENCOUNTER — Other Ambulatory Visit: Payer: Self-pay | Admitting: Internal Medicine

## 2012-07-31 ENCOUNTER — Other Ambulatory Visit (INDEPENDENT_AMBULATORY_CARE_PROVIDER_SITE_OTHER): Payer: Medicare Other

## 2012-07-31 DIAGNOSIS — D649 Anemia, unspecified: Secondary | ICD-10-CM

## 2012-07-31 LAB — CBC WITH DIFFERENTIAL/PLATELET
Eosinophils Relative: 2.6 % (ref 0.0–5.0)
Lymphocytes Relative: 17.8 % (ref 12.0–46.0)
MCV: 83.2 fl (ref 78.0–100.0)
Monocytes Absolute: 0.5 10*3/uL (ref 0.1–1.0)
Neutrophils Relative %: 71.4 % (ref 43.0–77.0)
Platelets: 247 10*3/uL (ref 150.0–400.0)
WBC: 6.3 10*3/uL (ref 4.5–10.5)

## 2012-09-06 ENCOUNTER — Other Ambulatory Visit (INDEPENDENT_AMBULATORY_CARE_PROVIDER_SITE_OTHER): Payer: Medicare Other

## 2012-09-06 DIAGNOSIS — D649 Anemia, unspecified: Secondary | ICD-10-CM

## 2012-09-06 LAB — CBC WITH DIFFERENTIAL/PLATELET
Basophils Relative: 0.4 % (ref 0.0–3.0)
Eosinophils Relative: 1.4 % (ref 0.0–5.0)
Lymphocytes Relative: 13.2 % (ref 12.0–46.0)
MCV: 83.5 fl (ref 78.0–100.0)
Neutrophils Relative %: 76 % (ref 43.0–77.0)
RBC: 3.88 Mil/uL (ref 3.87–5.11)
WBC: 9.5 10*3/uL (ref 4.5–10.5)

## 2012-09-07 ENCOUNTER — Other Ambulatory Visit: Payer: Self-pay

## 2012-09-07 DIAGNOSIS — D649 Anemia, unspecified: Secondary | ICD-10-CM

## 2012-09-28 ENCOUNTER — Inpatient Hospital Stay (HOSPITAL_COMMUNITY)
Admission: EM | Admit: 2012-09-28 | Discharge: 2012-09-30 | DRG: 683 | Disposition: A | Discharge status: Home / Self Care | Payer: Medicare Other | Attending: Internal Medicine | Admitting: Internal Medicine

## 2012-09-28 ENCOUNTER — Encounter (HOSPITAL_COMMUNITY): Payer: Self-pay

## 2012-09-28 DIAGNOSIS — E559 Vitamin D deficiency, unspecified: Secondary | ICD-10-CM | POA: Diagnosis present

## 2012-09-28 DIAGNOSIS — E860 Dehydration: Secondary | ICD-10-CM | POA: Diagnosis present

## 2012-09-28 DIAGNOSIS — R197 Diarrhea, unspecified: Secondary | ICD-10-CM

## 2012-09-28 DIAGNOSIS — Z8673 Personal history of transient ischemic attack (TIA), and cerebral infarction without residual deficits: Secondary | ICD-10-CM

## 2012-09-28 DIAGNOSIS — K5730 Diverticulosis of large intestine without perforation or abscess without bleeding: Secondary | ICD-10-CM

## 2012-09-28 DIAGNOSIS — T50995A Adverse effect of other drugs, medicaments and biological substances, initial encounter: Secondary | ICD-10-CM | POA: Diagnosis present

## 2012-09-28 DIAGNOSIS — I10 Essential (primary) hypertension: Secondary | ICD-10-CM

## 2012-09-28 DIAGNOSIS — M810 Age-related osteoporosis without current pathological fracture: Secondary | ICD-10-CM | POA: Diagnosis present

## 2012-09-28 DIAGNOSIS — I2510 Atherosclerotic heart disease of native coronary artery without angina pectoris: Secondary | ICD-10-CM

## 2012-09-28 DIAGNOSIS — N189 Chronic kidney disease, unspecified: Secondary | ICD-10-CM

## 2012-09-28 DIAGNOSIS — N183 Chronic kidney disease, stage 3 unspecified: Secondary | ICD-10-CM | POA: Diagnosis present

## 2012-09-28 DIAGNOSIS — E039 Hypothyroidism, unspecified: Secondary | ICD-10-CM

## 2012-09-28 DIAGNOSIS — E871 Hypo-osmolality and hyponatremia: Secondary | ICD-10-CM

## 2012-09-28 DIAGNOSIS — D649 Anemia, unspecified: Secondary | ICD-10-CM | POA: Diagnosis present

## 2012-09-28 DIAGNOSIS — I129 Hypertensive chronic kidney disease with stage 1 through stage 4 chronic kidney disease, or unspecified chronic kidney disease: Secondary | ICD-10-CM | POA: Diagnosis present

## 2012-09-28 DIAGNOSIS — Z681 Body mass index (BMI) 19 or less, adult: Secondary | ICD-10-CM

## 2012-09-28 DIAGNOSIS — R634 Abnormal weight loss: Secondary | ICD-10-CM | POA: Diagnosis present

## 2012-09-28 DIAGNOSIS — G459 Transient cerebral ischemic attack, unspecified: Secondary | ICD-10-CM

## 2012-09-28 DIAGNOSIS — I498 Other specified cardiac arrhythmias: Secondary | ICD-10-CM

## 2012-09-28 DIAGNOSIS — N179 Acute kidney failure, unspecified: Principal | ICD-10-CM | POA: Diagnosis present

## 2012-09-28 DIAGNOSIS — Z87891 Personal history of nicotine dependence: Secondary | ICD-10-CM

## 2012-09-28 HISTORY — DX: Fracture of unspecified part of neck of unspecified femur, initial encounter for closed fracture: S72.009A

## 2012-09-28 LAB — COMPREHENSIVE METABOLIC PANEL
AST: 20 U/L (ref 0–37)
Albumin: 4 g/dL (ref 3.5–5.2)
Alkaline Phosphatase: 76 U/L (ref 39–117)
BUN: 72 mg/dL — ABNORMAL HIGH (ref 6–23)
CO2: 33 mEq/L — ABNORMAL HIGH (ref 19–32)
Chloride: 91 mEq/L — ABNORMAL LOW (ref 96–112)
Creatinine, Ser: 2.51 mg/dL — ABNORMAL HIGH (ref 0.50–1.10)
GFR calc non Af Amer: 16 mL/min — ABNORMAL LOW (ref >90)
Potassium: 4.4 mEq/L (ref 3.5–5.1)
Total Bilirubin: 0.3 mg/dL (ref 0.3–1.2)

## 2012-09-28 LAB — CBC WITH DIFFERENTIAL/PLATELET
Basophils Absolute: 0 10*3/uL (ref 0.0–0.1)
Basophils Relative: 0 % (ref 0–1)
Eosinophils Absolute: 0.1 10*3/uL (ref 0.0–0.7)
Eosinophils Relative: 1 % (ref 0–5)
HCT: 34.3 % — ABNORMAL LOW (ref 36.0–46.0)
Hemoglobin: 11.3 g/dL — ABNORMAL LOW (ref 12.0–15.0)
MCH: 27.6 pg (ref 26.0–34.0)
MCHC: 32.9 g/dL (ref 30.0–36.0)
Monocytes Absolute: 0.7 10*3/uL (ref 0.1–1.0)
Monocytes Relative: 10 % (ref 3–12)
Neutro Abs: 5.3 10*3/uL (ref 1.7–7.7)
RDW: 14.1 % (ref 11.5–15.5)

## 2012-09-28 LAB — URINALYSIS, ROUTINE W REFLEX MICROSCOPIC
Bilirubin Urine: NEGATIVE
Glucose, UA: NEGATIVE mg/dL
Ketones, ur: NEGATIVE mg/dL
Protein, ur: NEGATIVE mg/dL
pH: 5.5 (ref 5.0–8.0)

## 2012-09-28 LAB — LIPASE, BLOOD: Lipase: 47 U/L (ref 11–59)

## 2012-09-28 LAB — URINE MICROSCOPIC-ADD ON

## 2012-09-28 MED ORDER — LEVOTHYROXINE SODIUM 125 MCG PO TABS
187.5000 ug | ORAL_TABLET | Freq: Every day | ORAL | Status: DC
  Administered 2012-09-29: 187.5 ug via ORAL
  Filled 2012-09-28 (×3): qty 1.5

## 2012-09-28 MED ORDER — SODIUM CHLORIDE 0.9 % IV SOLN
INTRAVENOUS | Status: AC
  Administered 2012-09-28 (×2): via INTRAVENOUS

## 2012-09-28 MED ORDER — HYDROCODONE-ACETAMINOPHEN 5-325 MG PO TABS: 1.0000 | ORAL_TABLET | ORAL | Status: DC | PRN

## 2012-09-28 MED ORDER — ASPIRIN EC 325 MG PO TBEC
650.0000 mg | DELAYED_RELEASE_TABLET | Freq: Every day | ORAL | Status: DC
  Administered 2012-09-28 – 2012-09-29 (×2): 650 mg via ORAL
  Filled 2012-09-28 (×3): qty 2

## 2012-09-28 MED ORDER — ONDANSETRON HCL 4 MG/2ML IJ SOLN: 4.0000 mg | Freq: Four times a day (QID) | INTRAMUSCULAR | Status: DC | PRN

## 2012-09-28 MED ORDER — ACETAMINOPHEN 325 MG PO TABS: 650.0000 mg | ORAL_TABLET | Freq: Four times a day (QID) | ORAL | Status: DC | PRN

## 2012-09-28 MED ORDER — SODIUM CHLORIDE 0.9 % IJ SOLN
3.0000 mL | Freq: Two times a day (BID) | INTRAMUSCULAR | Status: DC
  Administered 2012-09-28: 3 mL via INTRAVENOUS

## 2012-09-28 MED ORDER — ONDANSETRON HCL 4 MG PO TABS: 4.0000 mg | ORAL_TABLET | Freq: Four times a day (QID) | ORAL | Status: DC | PRN

## 2012-09-28 MED ORDER — SODIUM CHLORIDE 0.9 % IV SOLN
1000.0000 mL | Freq: Once | INTRAVENOUS | Status: AC
  Administered 2012-09-28: 1000 mL via INTRAVENOUS

## 2012-09-28 MED ORDER — SODIUM CHLORIDE 0.9 % IV SOLN
1000.0000 mL | INTRAVENOUS | Status: DC
  Administered 2012-09-28: 1000 mL via INTRAVENOUS

## 2012-09-28 MED ORDER — ACETAMINOPHEN 650 MG RE SUPP: 650.0000 mg | Freq: Four times a day (QID) | RECTAL | Status: DC | PRN

## 2012-09-28 MED ORDER — ENOXAPARIN SODIUM 40 MG/0.4ML ~~LOC~~ SOLN
40.0000 mg | Freq: Every day | SUBCUTANEOUS | Status: DC
  Administered 2012-09-28: 40 mg via SUBCUTANEOUS
  Filled 2012-09-28 (×2): qty 0.4

## 2012-09-28 MED ORDER — ONDANSETRON HCL 4 MG/2ML IJ SOLN
4.0000 mg | Freq: Once | INTRAMUSCULAR | Status: AC
  Administered 2012-09-28: 4 mg via INTRAVENOUS
  Filled 2012-09-28: qty 2

## 2012-09-28 NOTE — ED Provider Notes (Signed)
History     CSN: [id number] Arrival date & time 09/28/12  1548 First MD Initiated Contact with Patient 09/28/12 1552      Chief Complaint  Patient presents with  . Dehydration    HPI Comments: Pt went to her doctor's office today.  They told her that she was dehydrated and that she needed to come to the hospital if anything changed.  Pt has been on diuretics and still has been taking that medication.  She was told today to stop taking it.  She had seen her doctor a couple of weeks ago and was told she was dehydrated and should increase her fluid intake but they had not stopped the diuretic.  She has not been on antibiotics for several months.  No abdominal pain or vomiting.  She has been fatigued and has had some nausea and also thinks she has been losing weight.  Patient is a 79 y.o. female presenting with diarrhea.  Diarrhea Quality:  Watery Number of episodes:  3 epsides today since 10 am, she has been having some episodes for 4 weeks Progression since onset: it did get a little better over the 4 weeks and then a little worse in the last couple of days. Relieved by:  Nothing Worsened by:  Nothing tried   Past Medical History  Diagnosis Date  . Hypertension   . Senile osteoporosis   . Vitamin D deficiency   . Diverticulosis   . Hypothyroidism   . Atherosclerotic heart disease   . TIA (transient ischemic attack)   . CKD (chronic kidney disease)   . Inguinal hernia   . Broken hip     bilateral  . Complication of anesthesia     passed out after cataract surgery    Past Surgical History  Procedure Laterality Date  . Inguinal hernia repair    . Tonsillectomy    . Hip arthroplasty  04/01/2012    Procedure: ARTHROPLASTY BIPOLAR HIP;  Surgeon: [name], MD;  Location: [facility];  Service: Orthopedics;  Laterality: Left;  . Compression hip screw  05/06/2012    Procedure: COMPRESSION HIP;  Surgeon: Jodi Marble, MD;  Location: [facility] OR;  Service: Orthopedics;  Laterality:  Right;  . Skin lesion excision      benignt; from shoulder  . Hip surgery      left  . Eye surgery      bilateral  . Eye surgery      tear duct R side    Family History  Problem Relation Age of Onset  . CVA Mother     died age 5  . Emphysema Sister   . Colon cancer Neg Hx     History  Substance Use Topics  . Smoking status: Former Smoker    Types: Cigarettes    Quit date: 05/06/1971  . Smokeless tobacco: Never Used  . Alcohol Use: No     Comment: none since Nov 2013-was on 2 beers/night    OB History   Grav Para Term Preterm Abortions TAB SAB Ect Mult Living                  Review of Systems  Gastrointestinal: Positive for diarrhea.  All other systems reviewed and are negative.    Allergies  Review of patient's allergies indicates no known allergies.  Home Medications   Current Outpatient Rx  Name  Route  Sig  Dispense  Refill  . alendronate (FOSAMAX) 70 MG tablet   Oral  Take 70 mg by mouth every 7 (seven) days. Friday.Take with a full glass of water on an empty stomach.         Marland Kitchen aspirin EC 325 MG tablet   Oral   Take 650 mg by mouth at bedtime.         . Biotin 1000 MCG tablet   Oral   Take 1,000 mcg by mouth daily.         . bisoprolol (ZEBETA) 10 MG tablet   Oral   Take 1 tablet (10 mg total) by mouth daily.   30 tablet   0   . bumetanide (BUMEX) 1 MG tablet   Oral   Take 1 mg by mouth daily. Take 1/2 tablet every other day on Monday, Wednesday, Friday and Sunday.         . ferrous sulfate 325 (65 FE) MG tablet   Oral   Take 1 tablet (325 mg total) by mouth 2 (two) times daily.   60 tablet   5   . levothyroxine (SYNTHROID, LEVOTHROID) 125 MCG tablet   Oral   Take 187.5 mcg by mouth daily. Take 1 and 1/2 of the 125 mcg tablets         . Multiple Vitamin (MULTIVITAMIN WITH MINERALS) TABS   Oral   Take 1 tablet by mouth daily.         Marland Kitchen olmesartan (BENICAR) 20 MG tablet   Oral   Take 5 mg by mouth daily. Take 1/4  tablet daily         . Red Yeast Rice Extract (RED YEAST RICE PO)   Oral   Take 3 capsules by mouth daily.          Marland Kitchen zinc gluconate 50 MG tablet   Oral   Take 50 mg by mouth daily.           BP 153/55  Pulse 64  Temp(Src) 98 F (36.7 C) (Oral)  Resp 16  SpO2 100%  Physical Exam  Nursing note and vitals reviewed. Constitutional: She appears well-developed and well-nourished. No distress.  HENT:  Head: Normocephalic and atraumatic.  Right Ear: External ear normal.  Left Ear: External ear normal.  Eyes: Conjunctivae are normal. Right eye exhibits no discharge. Left eye exhibits no discharge. No scleral icterus.  Neck: Neck supple. No tracheal deviation present.  Cardiovascular: Normal rate, regular rhythm and intact distal pulses.   Pulmonary/Chest: Effort normal and breath sounds normal. No stridor. No respiratory distress. She has no wheezes. She has no rales.  Abdominal: Soft. Bowel sounds are normal. She exhibits no distension. There is no tenderness. There is no rebound and no guarding.  Musculoskeletal: She exhibits no edema and no tenderness.  Neurological: She is alert. She has normal strength. No sensory deficit. Cranial nerve deficit:  no gross defecits noted. She exhibits normal muscle tone. She displays no seizure activity. Coordination normal.  Skin: Skin is warm and dry. No rash noted.  Psychiatric: She has a normal mood and affect.    ED Course  Procedures (including critical care time)  Labs Reviewed  COMPREHENSIVE METABOLIC PANEL - Abnormal; Notable for the following:    Chloride 91 (*)    CO2 33 (*)    Glucose, Bld 113 (*)    BUN 72 (*)    Creatinine, Ser 2.51 (*)    GFR calc non Af Amer 16 (*)    GFR calc Af Amer 19 (*)    All other components  within normal limits  URINALYSIS, ROUTINE W REFLEX MICROSCOPIC - Abnormal; Notable for the following:    Leukocytes, UA MODERATE (*)    All other components within normal limits  CBC WITH DIFFERENTIAL  - Abnormal; Notable for the following:    Hemoglobin 11.3 (*)    HCT 34.3 (*)    All other components within normal limits  URINE MICROSCOPIC-ADD ON - Abnormal; Notable for the following:    Casts HYALINE CASTS (*)    All other components within normal limits  LIPASE, BLOOD  NA AND K (SODIUM & POTASSIUM), RAND UR   No results found.   1. Acute renal failure     MDM  Pt has worsening renal insufficiency.  BUN and CR 4 months ago was 33 and 1.33.  Pt has been having diarrhea but has also continued her diuretic until recently.  Urine is not particularly concentrated. Will consult with medicine regarding admission for iv fluid hydration, serial creatinine.        Celene Kras, MD 09/28/12 [phone]

## 2012-09-28 NOTE — ED Notes (Signed)
Pt sent by PCP.  Pt sts she was told that she was dehydrated.  Sts nausea and diarrhea x 1 week.  Denies pain.  Vitals are stable.  A & Ox4.

## 2012-09-28 NOTE — ED Notes (Signed)
Pt resting quietly with family at bedside. Pt requesting food. MD aware. VSS. Warm blanket provided.

## 2012-09-28 NOTE — H&P (Addendum)
PCP:  Nadean Corwin, MD  Cardiology: [facility]: Dr. Marina Goodell  Chief Complaint:   Abnormal labs  HPI: Rachael [name] is a 79 y.o. female   has a past medical history of Hypertension; Senile osteoporosis; Vitamin D deficiency; Diverticulosis; Hypothyroidism; Atherosclerotic heart disease; TIA (transient ischemic attack); CKD (chronic kidney disease); Inguinal hernia; Broken hip; and Complication of anesthesia.   Presented with  5 week history of abnormal stools. She reports no liquid stool but pencil thin stool, no true diarrhea.  No blood in stool. But she have had some black stool which she associates with iron supplements.  She went to her PCP who told her that she is dehydrated and to encourage PO intake about 1 month ago.  Patietn returned for reavaluation 2 days ago and her labs were checked at the time showing increase in Cr. Patient was then sent to ER for rehydration.  She has hx of CRD stage III. Her baseline Cr is around 1.6 but it is up to 2.5. She has lost some weight 8 lb over the past few months. Last colonoscopy was in 2009 and per patient it was ok.  Denies any vomiting but occasional nausea. No chest pain no Shortness of breath. She has been taking bumex fo r30 years but not sure why. She does endorse decreased PO intake and diminished fluid intake.   Review of Systems:    Pertinent positives include: nausea, change in stools  Constitutional:  No weight loss, night sweats, Fevers, chills, fatigue, weight loss  HEENT:  No headaches, Difficulty swallowing,Tooth/dental problems,Sore throat,  No sneezing, itching, ear ache, nasal congestion, post nasal drip,  Cardio-vascular:  No chest pain, Orthopnea, PND, anasarca, dizziness, palpitations.no Bilateral lower extremity swelling  GI:  No heartburn, indigestion, abdominal pain, vomiting, diarrhea, change in bowel habits, loss of appetite, melena, blood in stool, hematemesis Resp:  no shortness of breath at rest. No  dyspnea on exertion, No excess mucus, no productive cough, No non-productive cough, No coughing up of blood.No change in color of mucus.No wheezing. Skin:  no rash or lesions. No jaundice GU:  no dysuria, change in color of urine, no urgency or frequency. No straining to urinate.  No flank pain.  Musculoskeletal:  No joint pain or no joint swelling. No decreased range of motion. No back pain.  Psych:  No change in mood or affect. No depression or anxiety. No memory loss.  Neuro: no localizing neurological complaints, no tingling, no weakness, no double vision, no gait abnormality, no slurred speech, no confusion  Otherwise ROS are negative except for above, 10 systems were reviewed  Past Medical History: Past Medical History  Diagnosis Date  . Hypertension   . Senile osteoporosis   . Vitamin D deficiency   . Diverticulosis   . Hypothyroidism   . Atherosclerotic heart disease   . TIA (transient ischemic attack)   . CKD (chronic kidney disease)   . Inguinal hernia   . Broken hip     bilateral  . Complication of anesthesia     passed out after cataract surgery   Past Surgical History  Procedure Laterality Date  . Inguinal hernia repair    . Tonsillectomy    . Hip arthroplasty  04/01/2012    Procedure: ARTHROPLASTY BIPOLAR HIP;  Surgeon: [name], MD;  Location: [facility];  Service: Orthopedics;  Laterality: Left;  . Compression hip screw  05/06/2012    Procedure: COMPRESSION HIP;  Surgeon: Jodi Marble, MD;  Location: [facility]  OR;  Service: Orthopedics;  Laterality: Right;  . Skin lesion excision      benignt; from shoulder  . Hip surgery      left  . Eye surgery      bilateral  . Eye surgery      tear duct R side     Medications: Prior to Admission medications   Medication Sig Start Date End Date Taking? Authorizing Provider  alendronate (FOSAMAX) 70 MG tablet Take 70 mg by mouth every 7 (seven) days. Friday.Take with a full glass of water on an empty stomach.   Yes  Historical Provider, MD  aspirin EC 325 MG tablet Take 650 mg by mouth at bedtime.   Yes Historical Provider, MD  Biotin 1000 MCG tablet Take 1,000 mcg by mouth daily.   Yes Historical Provider, MD  bisoprolol (ZEBETA) 10 MG tablet Take 1 tablet (10 mg total) by mouth daily. 04/04/12  Yes Novlet Adelina Mings, MD  bumetanide (BUMEX) 1 MG tablet Take 1 mg by mouth daily. Take 1/2 tablet every other day on Monday, Wednesday, Friday and Sunday.   Yes Historical Provider, MD  ferrous sulfate 325 (65 FE) MG tablet Take 1 tablet (325 mg total) by mouth 2 (two) times daily. 05/15/12  Yes Hilarie Fredrickson, MD  levothyroxine (SYNTHROID, LEVOTHROID) 125 MCG tablet Take 187.5 mcg by mouth daily. Take 1 and 1/2 of the 125 mcg tablets   Yes Historical Provider, MD  Multiple Vitamin (MULTIVITAMIN WITH MINERALS) TABS Take 1 tablet by mouth daily.   Yes Historical Provider, MD  olmesartan (BENICAR) 20 MG tablet Take 5 mg by mouth daily. Take 1/4 tablet daily   Yes Historical Provider, MD  Red Yeast Rice Extract (RED YEAST RICE PO) Take 3 capsules by mouth daily.    Yes Historical Provider, MD  zinc gluconate 50 MG tablet Take 50 mg by mouth daily.   Yes Historical Provider, MD    Allergies:  No Known Allergies  Social History:  Ambulatory independently   Lives at home alone   reports that she quit smoking about 41 years ago. Her smoking use included Cigarettes. She smoked 0.00 packs per day. She has never used smokeless tobacco. She reports that she does not drink alcohol or use illicit drugs.   Family History: family history includes CVA in her mother and Emphysema in her sister.  There is no history of Colon cancer.    Physical Exam: Patient Vitals for the past 24 hrs:  BP Temp Temp src Pulse Resp SpO2  09/28/12 1958 150/47 mmHg - - 66 16 100 %  09/28/12 1556 153/55 mmHg 98 F (36.7 C) Oral 64 16 100 %    1. General:  in No Acute distress 2. Psychological: Alert and  Oriented 3. Head/ENT:     Dry Mucous  Membranes                          Head Non traumatic, neck supple                          Normal Dentition 4. SKIN:  decreased Skin turgor,  Skin clean Dry and intact no rash 5. Heart: Regular rate and rhythm no Murmur, Rub or gallop 6. Lungs: Clear to auscultation bilaterally, no wheezes or crackles   7. Abdomen: Soft, non-tender, Non distended 8. Lower extremities: no clubbing, cyanosis, or edema 9. Neurologically Grossly intact, moving all 4  extremities equally 10. MSK: Normal range of motion  body mass index is unknown because there is no weight on file.   Labs on Admission:   Recent Labs  09/28/12 1648  NA 135  K 4.4  CL 91*  CO2 33*  GLUCOSE 113*  BUN 72*  CREATININE 2.51*  CALCIUM 10.0    Recent Labs  09/28/12 1648  AST 20  ALT 13  ALKPHOS 76  BILITOT 0.3  PROT 7.1  ALBUMIN 4.0    Recent Labs  09/28/12 1648  LIPASE 47    Recent Labs  09/28/12 1648  WBC 7.3  NEUTROABS 5.3  HGB 11.3*  HCT 34.3*  MCV 83.7  PLT 354   No results found for this basename: CKTOTAL, CKMB, CKMBINDEX, TROPONINI,  in the last 72 hours No results found for this basename: TSH, T4TOTAL, FREET3, T3FREE, THYROIDAB,  in the last 72 hours No results found for this basename: VITAMINB12, FOLATE, FERRITIN, TIBC, IRON, RETICCTPCT,  in the last 72 hours No results found for this basename: HGBA1C    The CrCl is unknown because both a height and weight (above a minimum accepted value) are required for this calculation. ABG    Component Value Date/Time   TCO2 31 01/03/2010 0043     No results found for this basename: DDIMER     UA no evidence of UTI   Cultures:    Component Value Date/Time   SDES URINE, CATHETERIZED 05/06/2012 0254   SPECREQUEST NONE 05/06/2012 0254   CULT NO GROWTH 05/06/2012 0254   REPTSTATUS 05/07/2012 FINAL 05/06/2012 0254       Radiological Exams on Admission: No results found.  Chart has been reviewed  Assessment/Plan  79 yo female with  dehydration and acute on chronic renal failure with hx of change in stool caliber.   Present on Admission:  . Acute on chronic renal failure - - likely secondary to dehydration, check FeNA and obtain renal US . Monitor Cr. DC benicar and bumex . HYPERTENSION - stop benicar . Diarrhea - not true diarrhea but rather change in BM thickness, last colonoscopy was  In 2009. Given change in stools and weight loss a follow up with GI and colonoscopy would be helpful. This can be done as an outpatient.  . Dehydration - IVF and assess orthostatics prior to discharge.  Patient has hs of bradycardia will watch on telemetry.   Prophylaxis: Lovenox   CODE STATUS:  FULL CODE as per patient.   Other plan as per orders.  I have spent a total of 55 min on this admission  Farida Mcreynolds 09/28/2012, 8:21 PM

## 2012-09-28 NOTE — ED Notes (Signed)
MD at bedside. Dr. Felipa Furnace at bedside.

## 2012-09-29 ENCOUNTER — Inpatient Hospital Stay (HOSPITAL_COMMUNITY): Payer: Medicare Other

## 2012-09-29 DIAGNOSIS — R197 Diarrhea, unspecified: Secondary | ICD-10-CM

## 2012-09-29 DIAGNOSIS — E039 Hypothyroidism, unspecified: Secondary | ICD-10-CM

## 2012-09-29 LAB — COMPREHENSIVE METABOLIC PANEL
ALT: 10 U/L (ref 0–35)
AST: 15 U/L (ref 0–37)
Albumin: 3 g/dL — ABNORMAL LOW (ref 3.5–5.2)
Alkaline Phosphatase: 64 U/L (ref 39–117)
Chloride: 99 mEq/L (ref 96–112)
Potassium: 4.3 mEq/L (ref 3.5–5.1)
Sodium: 136 mEq/L (ref 135–145)
Total Bilirubin: 0.2 mg/dL — ABNORMAL LOW (ref 0.3–1.2)

## 2012-09-29 LAB — CBC
HCT: 30.7 % — ABNORMAL LOW (ref 36.0–46.0)
MCH: 27.1 pg (ref 26.0–34.0)
MCV: 85 fL (ref 78.0–100.0)
Platelets: 267 10*3/uL (ref 150–400)
RDW: 14.5 % (ref 11.5–15.5)

## 2012-09-29 LAB — TRANSFERRIN: Transferrin: 245 mg/dL (ref 200–360)

## 2012-09-29 LAB — T3, FREE: T3, Free: 2.9 pg/mL (ref 2.3–4.2)

## 2012-09-29 LAB — T4, FREE: Free T4: 1.65 ng/dL (ref 0.80–1.80)

## 2012-09-29 LAB — PHOSPHORUS: Phosphorus: 4.5 mg/dL (ref 2.3–4.6)

## 2012-09-29 LAB — TSH: TSH: 0.177 u[IU]/mL — ABNORMAL LOW (ref 0.350–4.500)

## 2012-09-29 LAB — VITAMIN B12: Vitamin B-12: 970 pg/mL — ABNORMAL HIGH (ref 211–911)

## 2012-09-29 MED ORDER — ENSURE COMPLETE PO LIQD
237.0000 mL | Freq: Two times a day (BID) | ORAL | Status: DC
  Administered 2012-09-29 – 2012-09-30 (×2): 237 mL via ORAL

## 2012-09-29 MED ORDER — SODIUM CHLORIDE 0.9 % IV SOLN
INTRAVENOUS | Status: DC
  Administered 2012-09-29 – 2012-09-30 (×2): via INTRAVENOUS

## 2012-09-29 MED ORDER — LEVOTHYROXINE SODIUM 100 MCG PO TABS
100.0000 ug | ORAL_TABLET | Freq: Every day | ORAL | Status: DC
  Administered 2012-09-30: 100 ug via ORAL
  Filled 2012-09-29 (×3): qty 1

## 2012-09-29 NOTE — Progress Notes (Signed)
INITIAL NUTRITION ASSESSMENT  DOCUMENTATION CODES Per approved criteria  -Not Applicable   INTERVENTION: Recommend liberalizing diet to regular Provide Ensure Complete BID Pt may benefit from appetite stimulant  NUTRITION DIAGNOSIS: Unintentional wt loss related to decreased appetite as evidenced by 7% wt loss in less than 5 months.   Goal: Pt to meet >/= 90% of their estimated nutrition needs  Monitor:  Weight PO intake Labs  Reason for Assessment: MST  79 y.o. female  Admitting Dx: Acute on chronic renal failure  ASSESSMENT: 79 y.o. female with past medical history of Hypertension; Senile osteoporosis; Vitamin D deficiency; Diverticulosis; Hypothyroidism; Atherosclerotic heart disease; TIA (transient ischemic attack); CKD (chronic kidney disease); Inguinal hernia; Broken hip; and Complication of anesthesia.  Presented with 5 week history of abnormal stools. Pt reports that she has had a decreased appetite since she went to [facility]; pt has moved back home now but, appetite is still poor and pt states that even her favorite foods don't interest her. Pt started drinking one Ensure supplement daily PTA and started to eat higher calorie foods. Encouraged pt to eat smaller more frequent meals, protein-rich foods at meals, increase calorie intake, and continuation of Ensure daily to help prevent further weight loss.   Height: Ht Readings from Last 1 Encounters:  09/28/12 5\' 9"  (1.753 m)    Weight: Wt Readings from Last 1 Encounters:  09/28/12 127 lb 3.2 oz (57.698 kg)    Ideal Body Weight: 145 lbs  % Ideal Body Weight: 88%  Wt Readings from Last 10 Encounters:  09/28/12 127 lb 3.2 oz (57.698 kg)  05/15/12 136 lb 4 oz (61.803 kg)  05/06/12 137 lb 5.6 oz (62.3 kg)  05/06/12 137 lb 5.6 oz (62.3 kg)  04/01/12 137 lb 5.6 oz (62.3 kg)  04/01/12 137 lb 5.6 oz (62.3 kg)  02/05/10 137 lb (62.143 kg)  01/06/10 136 lb (61.689 kg)    Usual Body Weight: 135 lbs  % Usual  Body Weight: 94%  BMI:  Body mass index is 18.78 kg/(m^2).  Estimated Nutritional Needs: Kcal: 1650-1730 Protein: 58-70 grams Fluid: 1.7-2 L  Skin: intact/WDL  Diet Order: Cardiac  EDUCATION NEEDS: -No education needs identified at this time   Intake/Output Summary (Last 24 hours) at 09/29/12 1423 Last data filed at 09/29/12 1246  Gross per 24 hour  Intake    300 ml  Output   1150 ml  Net   -850 ml    Last BM: 5/9  Labs:   Recent Labs Lab 09/28/12 1648 09/29/12 0540  NA 135 136  K 4.4 4.3  CL 91* 99  CO2 33* 29  BUN 72* 60*  CREATININE 2.51* 2.03*  CALCIUM 10.0 8.7  MG  --  2.4  PHOS  --  4.5  GLUCOSE 113* 92    CBG (last 3)  No results found for this basename: GLUCAP,  in the last 72 hours  Scheduled Meds: . aspirin EC  650 mg Oral QHS  . [START ON 09/30/2012] levothyroxine  100 mcg Oral QAC breakfast  . sodium chloride  3 mL Intravenous Q12H    Continuous Infusions: . sodium chloride 75 mL/hr at 09/29/12 1610    Past Medical History  Diagnosis Date  . Hypertension   . Senile osteoporosis   . Vitamin D deficiency   . Diverticulosis   . Hypothyroidism   . Atherosclerotic heart disease   . TIA (transient ischemic attack)   . CKD (chronic kidney disease)   . Inguinal  hernia   . Broken hip     bilateral  . Complication of anesthesia     passed out after cataract surgery    Past Surgical History  Procedure Laterality Date  . Inguinal hernia repair    . Tonsillectomy    . Hip arthroplasty  04/01/2012    Procedure: ARTHROPLASTY BIPOLAR HIP;  Surgeon: [name], MD;  Location: [facility];  Service: Orthopedics;  Laterality: Left;  . Compression hip screw  05/06/2012    Procedure: COMPRESSION HIP;  Surgeon: Jodi Marble, MD;  Location: [facility] OR;  Service: Orthopedics;  Laterality: Right;  . Skin lesion excision      benignt; from shoulder  . Hip surgery      left  . Eye surgery      bilateral  . Eye surgery      tear duct R side     Ian Malkin RD, LDN Inpatient Clinical Dietitian Pager: [phone] After Hours Pager: [phone]

## 2012-09-29 NOTE — Progress Notes (Addendum)
[facility] PROGRESS NOTE  Rachael [name] ZHY:[id number] DOB: [birth year] DOA: 09/28/2012 PCP: Nadean Corwin, MD  Assessment/Plan  Acute on chronic renal failure, baseline Creatinine 1.5, currently down to 2. -  Continue IVF -  FENa:  0.67% -  Renal US pending  HTN, blood pressure low normal -  Continue to hold benicar  Soft stools:  Advised GI outpatient follow up  Dehydration, likely due to diuretic use and sounds like medication was started for lower extremity edema -  TED hose -  COntinue hydration -  Stop diuretics  Normocytic anemia, hgb trending down somewhat with hydration -  Anemia panel   -  Occult stool  Hypothyroidism: TSH 0.177 >> if she has central hypothyroidism, may be falsely low. Will check TFTs. -  Reduce to 100 mcg daily for now   Diet:  Healthy heart Access:  PIV IVF:  NS at 30ml/h Proph:  lovenox  Code Status: full code Family Communication: spoke with patient alone Disposition Plan: pending improvement in kidney function, dehydration   Consultants:  none  Procedures:  none  Antibiotics:  none   HPI/Subjective:  STates that she feels well.  Denies CP, SOB, N/V, abdominal pain, blood in stools.  Had softer stools a few months ago  Objective: Filed Vitals:   09/28/12 1958 09/29/12 0544 09/29/12 0545 09/29/12 0546  BP: 150/47 152/34 135/40 111/51  Pulse: 66 69    Temp:  98 F (36.7 C)    TempSrc:  Oral    Resp: 16 18    SpO2: 100% 100%      Intake/Output Summary (Last 24 hours) at 09/29/12 0659 Last data filed at 09/29/12 0554  Gross per 24 hour  Intake      0 ml  Output    800 ml  Net   -800 ml   There were no vitals filed for this visit.  Exam:   General:  Thin F, No acute distress  HEENT:  NCAT, mildly sunken eyes, MMM while eating breakfast  Cardiovascular:  RRR, nl S1, S2 no mrg, 2+ pulses, warm extremities  Respiratory:  CTAB, no increased WOB  Abdomen:   NABS, soft, NT/ND  MSK:   Normal  tone and bulk, no LEE  Neuro:  Grossly intact  Data Reviewed: Basic Metabolic Panel:  Recent Labs Lab 09/28/12 1648  NA 135  K 4.4  CL 91*  CO2 33*  GLUCOSE 113*  BUN 72*  CREATININE 2.51*  CALCIUM 10.0   Liver Function Tests:  Recent Labs Lab 09/28/12 1648  AST 20  ALT 13  ALKPHOS 76  BILITOT 0.3  PROT 7.1  ALBUMIN 4.0    Recent Labs Lab 09/28/12 1648  LIPASE 47   No results found for this basename: AMMONIA,  in the last 168 hours CBC:  Recent Labs Lab 09/28/12 1648 09/29/12 0540  WBC 7.3 4.7  NEUTROABS 5.3  --   HGB 11.3* 9.8*  HCT 34.3* 30.7*  MCV 83.7 85.0  PLT 354 267   Cardiac Enzymes: No results found for this basename: CKTOTAL, CKMB, CKMBINDEX, TROPONINI,  in the last 168 hours BNP (last 3 results) No results found for this basename: PROBNP,  in the last 8760 hours CBG: No results found for this basename: GLUCAP,  in the last 168 hours  No results found for this or any previous visit (from the past 240 hour(s)).   Studies: No results found.  Scheduled Meds: . aspirin EC  650 mg Oral QHS  .  enoxaparin (LOVENOX) injection  40 mg Subcutaneous QHS  . levothyroxine  187.5 mcg Oral QAC breakfast  . sodium chloride  3 mL Intravenous Q12H   Continuous Infusions: . sodium chloride 100 mL/hr at 09/28/12 2324    Principal Problem:   Acute on chronic renal failure Active Problems:   HYPERTENSION   Diarrhea   Dehydration    Time spent: 30 min    Carlota Philley, [facility]  [facility] Pager [phone]. If 7PM-7AM, please contact night-coverage at [email or web address], password [email or web address] 09/29/2012, 6:59 AM  LOS: 1 day

## 2012-09-29 NOTE — Care Management Note (Signed)
    Page 1 of 1   09/29/2012     4:32:10 PM   CARE MANAGEMENT NOTE 09/29/2012  Patient:  DYLANIE, QUESENBERRY   Account Number:  [id number]  Date Initiated:  09/29/2012  Documentation initiated by:  Lanier Clam  Subjective/Objective Assessment:   ADMITTED W/ABNORMAL LABS.ACUTE RENAL FAILURE.     Action/Plan:   FROM HOME,ALONE.HAS RW,W/C,CANE,SHOWER SEAT,3N1.HAS PCP,PHARMACY.   Anticipated DC Date:  09/30/2012   Anticipated DC Plan:  HOME/SELF CARE      DC Planning Services  CM consult      Choice offered to / List presented to:             Status of service:  In process, will continue to follow Medicare Important Message given?   (If response is "NO", the following Medicare IM given date fields will be blank) Date Medicare IM given:   Date Additional Medicare IM given:    Discharge Disposition:    Per UR Regulation:  Reviewed for med. necessity/level of care/duration of stay  If discussed at Long Length of Stay Meetings, dates discussed:    Comments:  09/29/12 [facility] RN,BSN NCM [phone]

## 2012-09-30 LAB — CBC
HCT: 30.8 % — ABNORMAL LOW (ref 36.0–46.0)
Hemoglobin: 10 g/dL — ABNORMAL LOW (ref 12.0–15.0)
MCH: 28 pg (ref 26.0–34.0)
MCHC: 32.5 g/dL (ref 30.0–36.0)
RBC: 3.57 MIL/uL — ABNORMAL LOW (ref 3.87–5.11)

## 2012-09-30 LAB — BASIC METABOLIC PANEL
BUN: 44 mg/dL — ABNORMAL HIGH (ref 6–23)
Chloride: 105 mEq/L (ref 96–112)
Glucose, Bld: 93 mg/dL (ref 70–99)
Potassium: 4.3 mEq/L (ref 3.5–5.1)
Sodium: 140 mEq/L (ref 135–145)

## 2012-09-30 NOTE — Discharge Summary (Signed)
Physician Discharge Summary  Rachael [name] ZOX:[id number] DOB: [birth year] DOA: 09/28/2012  PCP: Nadean Corwin, MD  Admit date: 09/28/2012 Discharge date: 09/30/2012  Recommendations for Outpatient Follow-up:  1. Follow up with primary care doctor in 1-2 weeks to evaluate blood pressure, check for lower extremity edema, repeat kidney function, further investigation of anemia if not already complete.  May restart ARB depending on BP, labs.    Discharge Diagnoses:  Principal Problem:   Acute on chronic renal failure Active Problems:   HYPERTENSION   Diarrhea   Dehydration   Discharge Condition: stable, improved  Diet recommendation: regular  Wt Readings from Last 3 Encounters:  09/28/12 57.698 kg (127 lb 3.2 oz)  05/15/12 61.803 kg (136 lb 4 oz)  05/06/12 62.3 kg (137 lb 5.6 oz)    History of present illness:   Rachael [name] is a 79 y.o. female  has a past medical history of Hypertension; Senile osteoporosis; Vitamin D deficiency; Diverticulosis; Hypothyroidism; Atherosclerotic heart disease; TIA (transient ischemic attack); CKD (chronic kidney disease); Inguinal hernia; Broken hip; and Complication of anesthesia.  Presented with  5 week history of abnormal stools. She reports no liquid stool but pencil thin stool, no true diarrhea. No blood in stool. But she have had some black stool which she associates with iron supplements. She went to her PCP who told her that she is dehydrated and to encourage PO intake about 1 month ago. Patietn returned for reavaluation 2 days ago and her labs were checked at the time showing increase in Cr. Patient was then sent to ER for rehydration. She has hx of CRD stage III. Her baseline Cr is around 1.6 but it is up to 2.5.   She has lost some weight 8 lb over the past few months. Last colonoscopy was in 2009 and per patient it was ok.  Denies any vomiting but occasional nausea. No chest pain no Shortness of breath. She has been taking bumex  fo r30 years but not sure why. She does endorse decreased PO intake and diminished fluid intake.    Hospital Course:   Acute on chronic renal failure due to dehydration in setting of diuretic use.  Baseline Creatinine 1.5.  Initial creatinine was 2.51, FENa: 0.67%.  She was hydrated and her creatinine trended down to 1.5.  She should discontinue all diuretics for now and follow up with her primary care doctor in 1-2 weeks for a weight check and to ensure she is doing well off of diuretics.     HTN, blood pressure low normal.  Her benicar was held due to AKI.  Her blood pressure was normal to mildly elevated at the time of discharge, but will defer reinitiation of ARB until follow up appointment.   - Continue to hold benicar   Soft stools: Advised GI outpatient follow up.    Normocytic anemia, hgb trending down somewhat with hydration, but stabilized around 10mg /dl.   -  Vitamin W11 was normal -  Folate pending -  Iron deficiency  Lab Results  Component Value Date   IRON 45 09/29/2012   TIBC 305 09/29/2012   FERRITIN 107 09/29/2012  iron saturation 15 %.   -  Will need outpatient GI follow up -  Continue low dose iron  Hypothyroidism: TSH 0.177, but apparently her dose was adjusted just a few days ago.  - Resume home dose of synthroid and have repeat TSH in 4-6 weeks.    Osteoporosis:  Discontinued fosamax due to decreased CrCl.  PCP to prescribe alternative if indicated.    Consultants:  none Procedures:  none Antibiotics:  none    Discharge Exam: Filed Vitals:   09/30/12 0509  BP: 154/46  Pulse: 73  Temp: 97.3 F (36.3 C)  Resp: 16   Filed Vitals:   09/29/12 1906 09/29/12 2101 09/29/12 2103 09/30/12 0509  BP: 153/43 141/38 133/37 154/46  Pulse: 67 68 70 73  Temp: 98 F (36.7 C) 97.2 F (36.2 C)  97.3 F (36.3 C)  TempSrc: Oral Oral  Oral  Resp: 18 18  16   Height:      Weight:      SpO2: 100% 97%  100%    General: Thin F, No acute distress, mildly pressure  speech HEENT: NCAT, MMM, eyes no longer sunken, mild tongue fasciculations Cardiovascular: RRR, nl S1, S2 no mrg, 2+ pulses, warm extremities  Respiratory: CTAB, no increased WOB  Abdomen: NABS, soft, NT/ND  MSK: Normal tone and bulk, no LEE  Neuro: Grossly intact   Discharge Instructions      Discharge Orders   Future Appointments Provider Department Dept Phone   10/13/2012 11:30 AM Laurey Morale, MD [facility] [place]) [phone]   Future Orders Complete By Expires     Call MD for:  difficulty breathing, headache or visual disturbances  As directed     Call MD for:  extreme fatigue  As directed     Call MD for:  hives  As directed     Call MD for:  persistant dizziness or light-headedness  As directed     Call MD for:  persistant nausea and vomiting  As directed     Call MD for:  severe uncontrolled pain  As directed     Call MD for:  temperature >100.4  As directed     Diet general  As directed     Discharge instructions  As directed     Comments:      You were hospitalized with dehydration which caused your kidneys to not work properly.  You were started on IV fluids and your kidney function improved.  You were on a diuretic (lasix/furosemide and bumex/bumetanide are both diuretics) which can make you dehydrated.  Please stop these medications and drink plenty of fluids.  One of your blood pressure medications is not good to take if you are dehydrated, Benicar.  Please do NOT take this medication until your primary care doctor tells you it is safe to restart.  Please STOP your fosamax because this medication is not safe when your kidney function is not normal.  Your primary care doctor may be able to prescribe an alternative.  Please see you doctor in 1-2 weeks for repeat bloodwork, blood pressure check, possibly restarting your benicar.  Resume your synthroid as prescribed by your primary care doctor.    Increase activity slowly  As directed          Medication List    STOP taking these medications       alendronate 70 MG tablet  Commonly known as:  FOSAMAX     BENICAR 20 MG tablet  Generic drug:  olmesartan     bumetanide 1 MG tablet  Commonly known as:  BUMEX      TAKE these medications       aspirin EC 325 MG tablet  Take 650 mg by mouth at bedtime.     Biotin 1000 MCG tablet  Take 1,000 mcg by mouth daily.  bisoprolol 10 MG tablet  Commonly known as:  ZEBETA  Take 1 tablet (10 mg total) by mouth daily.     ferrous sulfate 325 (65 FE) MG tablet  Take 1 tablet (325 mg total) by mouth 2 (two) times daily.     levothyroxine 125 MCG tablet  Commonly known as:  SYNTHROID, LEVOTHROID  Take 187.5 mcg by mouth daily. Take 1 and 1/2 of the 125 mcg tablets     multivitamin with minerals Tabs  Take 1 tablet by mouth daily.     RED YEAST RICE PO  Take 3 capsules by mouth daily.     zinc gluconate 50 MG tablet  Take 50 mg by mouth daily.       Follow-up Information   Follow up with MCKEOWN,WILLIAM DAVID, MD. Schedule an appointment as soon as possible for a visit in 1 week.   Contact information:   [address] [place] [address] [phone]        The results of significant diagnostics from this hospitalization (including imaging, microbiology, ancillary and laboratory) are listed below for reference.    Significant Diagnostic Studies: US Renal  09/29/2012  *RADIOLOGY REPORT*  Clinical Data: Acute and chronic renal failure  RENAL/URINARY TRACT ULTRASOUND COMPLETE  Comparison:  None.  Findings:  Right Kidney:  10.6 cm in length.  Mildly increased cortical echogenicity.  No hydronephrosis or mass. Moderate cortical atrophy.  Left Kidney:  11.2 cm in length.  No hydronephrosis or mass.  Sub centimeter simple cyst in the lower pole.  Mildly increased cortical echogenicity.  Bladder:  Within normal limits.  IMPRESSION: Mildly increased cortical echogenicity bilaterally compatible with medical  renal parenchymal disease.  Chronic changes.   Original Report Authenticated By: Jolaine Click, M.D.     Microbiology: No results found for this or any previous visit (from the past 240 hour(s)).   Labs: Basic Metabolic Panel:  Recent Labs Lab 09/28/12 1648 09/29/12 0540 09/30/12 0537  NA 135 136 140  K 4.4 4.3 4.3  CL 91* 99 105  CO2 33* 29 27  GLUCOSE 113* 92 93  BUN 72* 60* 44*  CREATININE 2.51* 2.03* 1.50*  CALCIUM 10.0 8.7 8.6  MG  --  2.4  --   PHOS  --  4.5  --    Liver Function Tests:  Recent Labs Lab 09/28/12 1648 09/29/12 0540  AST 20 15  ALT 13 10  ALKPHOS 76 64  BILITOT 0.3 0.2*  PROT 7.1 5.5*  ALBUMIN 4.0 3.0*    Recent Labs Lab 09/28/12 1648  LIPASE 47   No results found for this basename: AMMONIA,  in the last 168 hours CBC:  Recent Labs Lab 09/28/12 1648 09/29/12 0540 09/30/12 0537  WBC 7.3 4.7 4.1  NEUTROABS 5.3  --   --   HGB 11.3* 9.8* 10.0*  HCT 34.3* 30.7* 30.8*  MCV 83.7 85.0 86.3  PLT 354 267 302   Cardiac Enzymes: No results found for this basename: CKTOTAL, CKMB, CKMBINDEX, TROPONINI,  in the last 168 hours BNP: BNP (last 3 results) No results found for this basename: PROBNP,  in the last 8760 hours CBG: No results found for this basename: GLUCAP,  in the last 168 hours  Time coordinating discharge: 45 minutes  Signed:  Reyah Streeter  [facility] 09/30/2012, 9:47 AM

## 2012-10-05 LAB — FOLATE RBC: RBC Folate: 2583 ng/mL — ABNORMAL HIGH (ref >=366)

## 2012-10-13 ENCOUNTER — Encounter: Payer: PRIVATE HEALTH INSURANCE | Admitting: Cardiology

## 2012-10-17 ENCOUNTER — Ambulatory Visit (INDEPENDENT_AMBULATORY_CARE_PROVIDER_SITE_OTHER): Payer: Medicare Other | Admitting: Cardiology

## 2012-10-17 ENCOUNTER — Encounter: Payer: Self-pay | Admitting: Cardiology

## 2012-10-17 VITALS — BP 168/58 | HR 62 | Ht 68.0 in | Wt 129.0 lb

## 2012-10-17 DIAGNOSIS — I5032 Chronic diastolic (congestive) heart failure: Secondary | ICD-10-CM

## 2012-10-17 DIAGNOSIS — I658 Occlusion and stenosis of other precerebral arteries: Secondary | ICD-10-CM

## 2012-10-17 DIAGNOSIS — N183 Chronic kidney disease, stage 3 unspecified: Secondary | ICD-10-CM

## 2012-10-17 DIAGNOSIS — I6529 Occlusion and stenosis of unspecified carotid artery: Secondary | ICD-10-CM | POA: Insufficient documentation

## 2012-10-17 DIAGNOSIS — I10 Essential (primary) hypertension: Secondary | ICD-10-CM

## 2012-10-17 DIAGNOSIS — I6523 Occlusion and stenosis of bilateral carotid arteries: Secondary | ICD-10-CM

## 2012-10-17 DIAGNOSIS — I509 Heart failure, unspecified: Secondary | ICD-10-CM

## 2012-10-17 MED ORDER — PRAVASTATIN SODIUM 40 MG PO TABS: 40.0000 mg | ORAL_TABLET | Freq: Every evening | ORAL | Status: DC

## 2012-10-17 MED ORDER — CLONIDINE HCL 0.1 MG PO TABS: 0.1000 mg | ORAL_TABLET | Freq: Two times a day (BID) | ORAL | Status: DC

## 2012-10-17 NOTE — Patient Instructions (Addendum)
Call me and let me know exactly how you are taking furosemide (lasix) . Luana Shu [phone]  Stop red yeast rice extract.  Start pravachol (pravstatin) 40mg  daily in the evening.  Take clonidine (catapres)  0.1mg  two times a day regularly.  Take and record your blood pressure daily. I will call you in about 2 weeks to get the readings.  Your physician recommends that you schedule a follow-up appointment in: 1 month with the PA/NP.   Your physician recommends that you return for a FASTING lipid profile /liver profile in 2 months.   Your physician recommends that you schedule a follow-up appointment in: 3 months with Dr Shirlee Latch.   Your physician has requested that you have a carotid duplex. This test is an ultrasound of the carotid arteries in your neck. It looks at blood flow through these arteries that supply the brain with blood. Allow one hour for this exam. There are no restrictions or special instructions. December 2014.

## 2012-10-17 NOTE — Progress Notes (Signed)
Patient ID: Rachael [name], female   DOB: September 20, 1923, 79 y.o.   MRN: [id number] PCP: Dr. Oneta Rack  79 yo with history of HTN, TIAs, and chronic diastolic CHF presents for cardiology followup.  I have not seen her for several years now.  She fractured both of her femurs last year, once in 11/13 and once in 12/13.  She had surgery both times.  She was then admitted in 5/14 with dehydration and acute on chronic renal failure.  During the admission with femur fracture in 12/13, she was told she had "fluid around her lungs."  She was taken off bumetanide when she was admitted with dehydration in 5/14 and is now taking furosemide.  She does not remember the dose.  She takes it most days but not every day.  Her BP is elevated today.  She is taking bisoprolol regularly.  She has clonidine but only uses it occasionally (0.1 mg).  She does not take it daily.    She is not short of breath walking short distances on flat ground.  She gets mild dyspnea walking longer distances such as in the grocery store.  No orthopnea, no chest pain.  She does get some lower leg swelling.  She lives alone and drives and does her own shopping.  She does some water walking for exercise.    ECG: NSR at 59, LAE, poor anterior R wave progression  Labs (5/14): K 4.3, creatinine 1.5  Allergies (verified):  No Known Drug Allergies   Past Medical History:  1. DIVERTICULOSIS, COLON (ICD-562.10)  2. HYPOTHYROIDISM (ICD-244.9)  3. HYPERTENSION (ICD-401.9)  4. TIA: x 2, manifested by diplopia. Carotid dopplers (8/11): 40-50% RICA stenosis.  Carotid dopplers (12/13) with 40-59% bilateral ICA stenoses.  5. Bradycardia: Mild.  Holter monitor (8/11) with no significant bradycardia, frequent PACs, few short runs of slow atrial tachycardia.  6. Chronic diastolic CHF:  Echo (8/11): EF 65%, mild LV hypertrophy, aortic sclerosis without significant stenosis, mild mitral regurgitation.  Echo (12/13) with EF 65-70%, moderate TR, PA systolic  pressure 55 mmHg, mild AS with mean gradient 17 mmHg.  7. Aortic stenosis: Mild on 12/13 echo.  8. Femur fractures in 11/13 and 12/13, had surgery for repair both times.   9. CKD  Family History:  No premature CAD. Mother with CVA in her 30s.   Social History:  Lives alone. Widowed. No children but has a nephew living in town. Nonsmoker.   Current Outpatient Prescriptions  Medication Sig Dispense Refill  . aspirin EC 325 MG tablet Take 325 mg by mouth at bedtime.       . Biotin 1000 MCG tablet Take 1,000 mcg by mouth daily.      . bisoprolol (ZEBETA) 10 MG tablet Take 1 tablet (10 mg total) by mouth daily.  30 tablet  0  . bisoprolol-hydrochlorothiazide (ZIAC) 10-6.25 MG per tablet Take 1 tablet by mouth daily. 1 tab every morning per Jennifer(Pharmacist) @@ [facility]. 5.27.14 BC-CMA      . cloNIDine HCl (KAPVAY) 0.1 MG TB12 ER tablet Take 0.1 mg by mouth 3 (three) times daily.      . ferrous sulfate 325 (65 FE) MG tablet Take 1 tablet (325 mg total) by mouth 2 (two) times daily.  60 tablet  5  . furosemide (LASIX) 80 MG tablet Take 80 mg by mouth. 1/2 to 1 tab daily per Victorino Dike (Pharmacist) @@ [facility]. 5.27.14 BC-CMA      . levothyroxine (SYNTHROID, LEVOTHROID) 125 MCG tablet  Take 187.5 mcg by mouth daily. Take 1 and 1/2 of the 125 mcg tablets      . mirtazapine (REMERON) 15 MG tablet Take 15 mg by mouth at bedtime.      . Multiple Vitamin (MULTIVITAMIN WITH MINERALS) TABS Take 1 tablet by mouth daily.      Marland Kitchen zinc gluconate 50 MG tablet Take 50 mg by mouth daily.      . cloNIDine (CATAPRES) 0.1 MG tablet Take 1 tablet (0.1 mg total) by mouth 2 (two) times daily.  60 tablet  6  . pravastatin (PRAVACHOL) 40 MG tablet Take 1 tablet (40 mg total) by mouth every evening.  30 tablet  3   No current facility-administered medications for this visit.    BP 168/58  Pulse 62  Ht 5\' 8"  (1.727 m)  Wt 129 lb (58.514 kg)  BMI 19.62 kg/m2 General: NAD Neck: JVP 8 cm,  no thyromegaly or thyroid nodule.  Lungs: Clear to auscultation bilaterally with normal respiratory effort. CV: Nondisplaced PMI.  Heart regular S1/S2, no S3/S4, 2/6 early systolic murmur RUSB.  Trace ankle edema.  Bilateral carotid bruits.  Normal pedal pulses.  Abdomen: Soft, nontender, no hepatosplenomegaly, no distention.  Neurologic: Alert and oriented x 3.  Psych: Normal affect. Extremities: No clubbing or cyanosis.   Assessment/Plan: 1. Chronic diastolic CHF: On exam today, patient is perhaps mildly volume overloaded.  She has Lasix at home that she is taking several days a week, she is not sure of the dose.   - I would like her to take Lasix daily at the current dose.  She will call back to tell us what dose she is on.  - Check BNP today.  2. HTN: Blood pressure is high today.  She takes clonidine "occasionally."  Continue current dose of bisoprolol.  Take clonidine 0.1 mg bid.  Will call in 2 wks for BP check (she will monitor daily).  Next step would be addition of amlodipine.  Hold off on ACEI/ARB with CKD at this time.  3. Carotid stenosis: Repeat carotids in 12/14.  Needs statin given vascular disease: would stop red yeast rice, start pravastatin 40 mg daily with lipids/LFTs in 2 months.   4. Aortic stenosis: Mild, continue to follow over time.   Followup with PA in 1 month.   Marca Ancona 10/17/2012 3:38 PM

## 2012-10-19 ENCOUNTER — Telehealth: Payer: Self-pay | Admitting: Cardiology

## 2012-10-19 ENCOUNTER — Other Ambulatory Visit: Payer: Self-pay | Admitting: *Deleted

## 2012-10-19 DIAGNOSIS — I10 Essential (primary) hypertension: Secondary | ICD-10-CM

## 2012-10-19 DIAGNOSIS — I5032 Chronic diastolic (congestive) heart failure: Secondary | ICD-10-CM

## 2012-10-19 DIAGNOSIS — I6523 Occlusion and stenosis of bilateral carotid arteries: Secondary | ICD-10-CM

## 2012-10-19 MED ORDER — CLONIDINE HCL 0.1 MG PO TABS: 0.1000 mg | ORAL_TABLET | Freq: Two times a day (BID) | ORAL | Status: DC

## 2012-10-19 MED ORDER — PRAVASTATIN SODIUM 40 MG PO TABS: 40.0000 mg | ORAL_TABLET | Freq: Every evening | ORAL | Status: DC

## 2012-10-19 NOTE — Telephone Encounter (Signed)
New Prob      Wanted to let nurse know FUROSEMIDE is 60 mg.

## 2012-10-19 NOTE — Telephone Encounter (Signed)
Fax Received. Refill Completed. Le Ferraz Chowoe (R.M.A)   

## 2012-10-19 NOTE — Telephone Encounter (Signed)
Spoke with pt and she confirmed lasix is 40mg  daily--1/2 of an 80mg  tablet daily

## 2012-11-01 ENCOUNTER — Telehealth: Payer: Self-pay | Admitting: *Deleted

## 2012-11-01 NOTE — Telephone Encounter (Signed)
HTN: Blood pressure is high today. She takes clonidine "occasionally." Continue current dose of bisoprolol. Take clonidine 0.1 mg bid. Will call in 2 wks for BP check (she will monitor daily). Next step would be addition of amlodipine. Hold off on ACEI/ARB with CKD at this time.   11/01/12 Spoke with patient. Recent BP readings since office visit 10/17/12: oldest to newest readings-- 137/56 144/65  138/72 132/50 138/58 134/78 143/63 147/62 140/56 170/64 139/60 166/68 140/58 138/70 133/62 122/50 153/61 146/51 148/59 123/54 166/64 135/56 107/49 103/39 158/66 137/55 139/68 113/55. I will forward to Dr Shirlee Latch for review.

## 2012-11-01 NOTE — Telephone Encounter (Signed)
Lot of variability.  Will not change meds for now.  Make sure to take clonidine bid every day.

## 2012-11-02 NOTE — Telephone Encounter (Signed)
Pt.notified

## 2012-11-10 ENCOUNTER — Telehealth: Payer: Self-pay | Admitting: Cardiology

## 2012-11-17 ENCOUNTER — Ambulatory Visit (INDEPENDENT_AMBULATORY_CARE_PROVIDER_SITE_OTHER): Payer: Medicare Other | Admitting: Nurse Practitioner

## 2012-11-17 ENCOUNTER — Encounter: Payer: Self-pay | Admitting: Nurse Practitioner

## 2012-11-17 VITALS — BP 160/68 | HR 60 | Ht 68.5 in | Wt 130.1 lb

## 2012-11-17 DIAGNOSIS — I10 Essential (primary) hypertension: Secondary | ICD-10-CM

## 2012-11-17 NOTE — Patient Instructions (Addendum)
Stay on your current medicines  Continue to monitor your blood pressure at home and bring your readings in for your next appointment  Try to limit your salt and keep elevating your legs  Fasting labs next month  See Dr. Shirlee Latch in August  Call the [facility] office at [phone] if you have any questions, problems or concerns.

## 2012-11-17 NOTE — Progress Notes (Signed)
Rachael [name] Date of Birth: 24-Jun-1923 Medical Record #[id number]  History of Present Illness: Rachael [name] is seen back today for a one month check. Seen for Dr. Shirlee Latch. She is an 79 yo with history of HTN, TIAs, and chronic diastolic CHF  Had not been seen for several years until last month. She fractured both of her femurs last year, once in 11/13 and once in 12/13. She had surgery both times. She was then admitted in 5/14 with dehydration and acute on chronic renal failure. During the admission with femur fracture in 12/13, she was told she had "fluid around her lungs." She was taken off bumetanide when she was admitted with dehydration in 5/14 and is now taking furosemide. She was using Clonidine prn. Her other issues include hypothyroidism, HTN, TIA with carotid disease, mild bradycardia, diastolic CHF with normal EF, mild AS and CKD.   Seen a month ago. Pravachol was started. Clonidine was changed to BID and to take regularly. She is for a carotid duplex in December.   Her BP records from 2 weeks ago showed lots of variability. Her medicines were not changed.   She comes back today. She is here alone. Says she is "just fine". BP still a little labile. Still with a little swelling in her feet. Can't bend over due to her hips and can't put on support stockings. No chest pain or significant dyspnea that she reports. Tolerating her medicines. Says she has been taking the Clonidine BID all along. Biggest complaint is gout in her knee.   Current Outpatient Prescriptions  Medication Sig Dispense Refill  . allopurinol (ZYLOPRIM) 100 MG tablet Take 100 mg by mouth daily. Takes for gout      . aspirin EC 325 MG tablet Take 325 mg by mouth at bedtime.       . Biotin 1000 MCG tablet Take 1,000 mcg by mouth daily.      . bisoprolol-hydrochlorothiazide (ZIAC) 10-6.25 MG per tablet Take 1 tablet by mouth daily. 1 tab every morning per Jennifer(Pharmacist) @@ [facility]. 5.27.14 BC-CMA        . cholecalciferol (VITAMIN D) 1000 UNITS tablet Take 1,000 Units by mouth daily.      . cloNIDine (CATAPRES) 0.1 MG tablet Take 1 tablet (0.1 mg total) by mouth 2 (two) times daily.  60 tablet  6  . ferrous sulfate 325 (65 FE) MG tablet Take 1 tablet (325 mg total) by mouth 2 (two) times daily.  60 tablet  5  . furosemide (LASIX) 40 MG tablet 1/2 of a 80mg  tablet daily      . levothyroxine (SYNTHROID, LEVOTHROID) 125 MCG tablet Take 187.5 mcg by mouth daily. Take 1 tablet 125 mcg 4 days a week and 1/2 3 days a week      . Multiple Vitamin (MULTIVITAMIN WITH MINERALS) TABS Take 1 tablet by mouth daily.      . pravastatin (PRAVACHOL) 40 MG tablet Take 1 tablet (40 mg total) by mouth every evening.  30 tablet  3  . predniSONE (DELTASONE) 1 MG tablet Take 2.5 mg by mouth daily.      Marland Kitchen zinc gluconate 50 MG tablet Take 50 mg by mouth daily.       No current facility-administered medications for this visit.    No Known Allergies  Past Medical History:   1. DIVERTICULOSIS, COLON (ICD-562.10)  2. HYPOTHYROIDISM (ICD-244.9)  3. HYPERTENSION (ICD-401.9)  4. TIA: x 2, manifested by diplopia. Carotid dopplers (8/11): 40-50% RICA  stenosis. Carotid dopplers (12/13) with 40-59% bilateral ICA stenoses.  5. Bradycardia: Mild. Holter monitor (8/11) with no significant bradycardia, frequent PACs, few short runs of slow atrial tachycardia.  6. Chronic diastolic CHF: Echo (8/11): EF 16%, mild LV hypertrophy, aortic sclerosis without significant stenosis, mild mitral regurgitation. Echo (12/13) with EF 65-70%, moderate TR, PA systolic pressure 55 mmHg, mild AS with mean gradient 17 mmHg.  7. Aortic stenosis: Mild on 12/13 echo.  8. Femur fractures in 11/13 and 12/13, had surgery for repair both times.  9. CKD  Past Medical History  Diagnosis Date  . Hypertension   . Senile osteoporosis   . Vitamin D deficiency   . Diverticulosis   . Hypothyroidism   . Atherosclerotic heart disease   . TIA (transient  ischemic attack)   . CKD (chronic kidney disease)   . Inguinal hernia   . Broken hip     bilateral  . Complication of anesthesia     passed out after cataract surgery    Past Surgical History  Procedure Laterality Date  . Inguinal hernia repair    . Tonsillectomy    . Hip arthroplasty  04/01/2012    Procedure: ARTHROPLASTY BIPOLAR HIP;  Surgeon: [name], MD;  Location: [facility];  Service: Orthopedics;  Laterality: Left;  . Compression hip screw  05/06/2012    Procedure: COMPRESSION HIP;  Surgeon: Jodi Marble, MD;  Location: [facility] OR;  Service: Orthopedics;  Laterality: Right;  . Skin lesion excision      benignt; from shoulder  . Hip surgery      left  . Eye surgery      bilateral  . Eye surgery      tear duct R side    History  Smoking status  . Former Smoker  . Types: Cigarettes  . Quit date: 05/06/1971  Smokeless tobacco  . Never Used    History  Alcohol Use No    Comment: none since Nov 2013-was on 2 beers/night    Family History  Problem Relation Age of Onset  . CVA Mother     died age 68  . Emphysema Sister   . Colon cancer Neg Hx     Review of Systems: The review of systems is per the HPI.  All other systems were reviewed and are negative.  Physical Exam: BP 160/68  Pulse 60  Ht 5' 8.5" (1.74 m)  Wt 130 lb 1.9 oz (59.022 kg)  BMI 19.49 kg/m2 Patient is very pleasant and in no acute distress. Skin is warm and dry. Color is normal.  HEENT is unremarkable. Normocephalic/atraumatic. PERRL. Sclera are nonicteric. Neck is supple. No masses. No JVD. Lungs are clear. Cardiac exam shows a regular rate and rhythm. Soft outflow murmur noted. Abdomen is soft. Extremities are with trace to 1+ edema. Gait and ROM are intact. She is using a cane. No gross neurologic deficits noted.  LABORATORY DATA:  Lab Results  Component Value Date   WBC 4.1 09/30/2012   HGB 10.0* 09/30/2012   HCT 30.8* 09/30/2012   PLT 302 09/30/2012   GLUCOSE 93 09/30/2012   CHOL 164  02/05/2010   TRIG 106.0 02/05/2010   HDL 46.80 02/05/2010   LDLCALC 96 02/05/2010   ALT 10 09/29/2012   AST 15 09/29/2012   NA 140 09/30/2012   K 4.3 09/30/2012   CL 105 09/30/2012   CREATININE 1.50* 09/30/2012   BUN 44* 09/30/2012   CO2 27 09/30/2012   TSH 0.177*  09/29/2012   INR 1.15 05/05/2012     Assessment / Plan: 1. HTN - with some variability noted on readings at home but overall the average is ok. Would keep her on her current regimen for now. Recheck of her BP by me is 158/60 in both arms.   2. HLD - now on Pravachol - for repeat labs next month  3. Carotid disease - due for duplex in December.   4. Chronic diastolic CHF - seems to be fairly compensated to me. Does have some swelling but I suspect this is more venous insufficiency and her keeping her legs down. She is not able to wear support stockings. Would restrict salt and continue to elevate her legs.   5. Mild AS - would manage conservatively.  I think overall she is doing ok. No change in her current regimen. Would like her to keep a BP diary and take to all of her provider appointments for review.   Patient is agreeable to this plan and will call if any problems develop in the interim.   Rosalio Macadamia, RN, ANP-C [facility] [address] [place], [place]  [address]

## 2012-12-15 ENCOUNTER — Other Ambulatory Visit (INDEPENDENT_AMBULATORY_CARE_PROVIDER_SITE_OTHER): Payer: Medicare Other

## 2012-12-15 DIAGNOSIS — I6523 Occlusion and stenosis of bilateral carotid arteries: Secondary | ICD-10-CM

## 2012-12-15 DIAGNOSIS — I10 Essential (primary) hypertension: Secondary | ICD-10-CM

## 2012-12-15 DIAGNOSIS — I5032 Chronic diastolic (congestive) heart failure: Secondary | ICD-10-CM

## 2012-12-15 DIAGNOSIS — I658 Occlusion and stenosis of other precerebral arteries: Secondary | ICD-10-CM

## 2012-12-15 DIAGNOSIS — I6529 Occlusion and stenosis of unspecified carotid artery: Secondary | ICD-10-CM

## 2012-12-15 LAB — HEPATIC FUNCTION PANEL
Albumin: 3.8 g/dL (ref 3.5–5.2)
Alkaline Phosphatase: 68 U/L (ref 39–117)
Bilirubin, Direct: 0.1 mg/dL (ref 0.0–0.3)
Total Protein: 6.2 g/dL (ref 6.0–8.3)

## 2012-12-15 LAB — LIPID PANEL
Cholesterol: 114 mg/dL (ref 0–200)
LDL Cholesterol: 55 mg/dL (ref 0–99)
Total CHOL/HDL Ratio: 2
Triglycerides: 38 mg/dL (ref 0.0–149.0)
VLDL: 7.6 mg/dL (ref 0.0–40.0)

## 2013-01-10 ENCOUNTER — Encounter: Payer: Self-pay | Admitting: Cardiology

## 2013-01-11 ENCOUNTER — Encounter: Payer: Self-pay | Admitting: Cardiology

## 2013-01-11 ENCOUNTER — Ambulatory Visit (INDEPENDENT_AMBULATORY_CARE_PROVIDER_SITE_OTHER): Payer: Medicare Other | Admitting: Cardiology

## 2013-01-11 VITALS — BP 126/60 | HR 55 | Ht 68.5 in | Wt 130.0 lb

## 2013-01-11 DIAGNOSIS — I5032 Chronic diastolic (congestive) heart failure: Secondary | ICD-10-CM

## 2013-01-11 DIAGNOSIS — I10 Essential (primary) hypertension: Secondary | ICD-10-CM

## 2013-01-11 DIAGNOSIS — I6523 Occlusion and stenosis of bilateral carotid arteries: Secondary | ICD-10-CM

## 2013-01-11 DIAGNOSIS — I658 Occlusion and stenosis of other precerebral arteries: Secondary | ICD-10-CM

## 2013-01-11 DIAGNOSIS — I6529 Occlusion and stenosis of unspecified carotid artery: Secondary | ICD-10-CM

## 2013-01-11 DIAGNOSIS — E785 Hyperlipidemia, unspecified: Secondary | ICD-10-CM

## 2013-01-11 DIAGNOSIS — I509 Heart failure, unspecified: Secondary | ICD-10-CM

## 2013-01-11 NOTE — Patient Instructions (Addendum)
Take lasix 40mg  daily as needed for shortness of breath.  Your physician has requested that you have a carotid duplex. This test is an ultrasound of the carotid arteries in your neck. It looks at blood flow through these arteries that supply the brain with blood. Allow one hour for this exam. There are no restrictions or special instructions. December 2014  Your physician wants you to follow-up in: 6 months with Dr Shirlee Latch. (February 2015). You will receive a reminder letter in the mail two months in advance. If you don't receive a letter, please call our office to schedule the follow-up appointment.

## 2013-01-12 DIAGNOSIS — E785 Hyperlipidemia, unspecified: Secondary | ICD-10-CM | POA: Insufficient documentation

## 2013-01-12 NOTE — Progress Notes (Signed)
Patient ID: Rachael [name], female   DOB: [birth year], 79 y.o.   MRN: [id number] PCP: Dr. Oneta Rack  79 yo with history of HTN, TIAs, and chronic diastolic CHF presents for cardiology followup.  She fractured both of her femurs last year, once in 11/13 and once in 12/13.  She had surgery both times.  She was then admitted in 5/14 with dehydration and acute on chronic renal failure.  During the admission with femur fracture in 12/13, she was told she had "fluid around her lungs."  She was taken off bumetanide when she was admitted with dehydration in 5/14 and put on Lasix later on.  Recently, she stopped Lasix due to frequent urination keeping her up at night.    She is doing well currently.  She walks in a pool twice a week.  She is short of breath walking up a hill.  No chest pain, no orthopnea/PND. Weight is stable.  BP seems controlled.   Labs (5/14): K 4.3, creatinine 1.5 Labs (7/14): K 4.3, creatinine 1.5, LDL 55, HDL 51  Allergies (verified):  No Known Drug Allergies   Past Medical History:  1. DIVERTICULOSIS, COLON (ICD-562.10)  2. HYPOTHYROIDISM (ICD-244.9)  3. HYPERTENSION (ICD-401.9)  4. TIA: x 2, manifested by diplopia. Carotid dopplers (8/11): 40-50% RICA stenosis.  Carotid dopplers (12/13) with 40-59% bilateral ICA stenoses.  5. Bradycardia: Mild.  Holter monitor (8/11) with no significant bradycardia, frequent PACs, few short runs of slow atrial tachycardia.  6. Chronic diastolic CHF:  Echo (8/11): EF 65%, mild LV hypertrophy, aortic sclerosis without significant stenosis, mild mitral regurgitation.  Echo (12/13) with EF 65-70%, moderate TR, PA systolic pressure 55 mmHg, mild AS with mean gradient 17 mmHg.  7. Aortic stenosis: Mild on 12/13 echo.  8. Femur fractures in 11/13 and 12/13, had surgery for repair both times.   9. CKD  Family History:  No premature CAD. Mother with CVA in her 30s.   Social History:  Lives alone. Widowed. No children but has a nephew living in town.  Nonsmoker.   Current Outpatient Prescriptions  Medication Sig Dispense Refill  . allopurinol (ZYLOPRIM) 100 MG tablet Take 100 mg by mouth daily. Takes for gout      . aspirin EC 325 MG tablet Take 325 mg by mouth at bedtime.       . Biotin 1000 MCG tablet Take 1,000 mcg by mouth daily.      . bisoprolol-hydrochlorothiazide (ZIAC) 10-6.25 MG per tablet Take 1 tablet by mouth daily. 1 tab every morning per Jennifer(Pharmacist) @@ [facility]. 5.27.14 BC-CMA      . cholecalciferol (VITAMIN D) 1000 UNITS tablet Take 1,000 Units by mouth daily.      . cloNIDine (CATAPRES) 0.1 MG tablet Take 1 tablet (0.1 mg total) by mouth 2 (two) times daily.  60 tablet  6  . ferrous sulfate 325 (65 FE) MG tablet Take 1 tablet (325 mg total) by mouth 2 (two) times daily.  60 tablet  5  . levothyroxine (SYNTHROID, LEVOTHROID) 125 MCG tablet Take by mouth daily. Take 1 tablet 125 mcg 4 days a week and 1/2 3 days a week      . Multiple Vitamin (MULTIVITAMIN WITH MINERALS) TABS Take 1 tablet by mouth daily.      . pravastatin (PRAVACHOL) 40 MG tablet Take 1 tablet (40 mg total) by mouth every evening.  30 tablet  3  . zinc gluconate 50 MG tablet Take 50 mg by mouth daily.      Marland Kitchen  furosemide (LASIX) 40 MG tablet 1 tablet daily as needed for shortness of breath       No current facility-administered medications for this visit.    BP 126/60  Pulse 55  Ht 5' 8.5" (1.74 m)  Wt 58.968 kg (130 lb)  BMI 19.48 kg/m2  SpO2 98% General: NAD Neck: JVP 7 cm, no thyromegaly or thyroid nodule.  Lungs: Clear to auscultation bilaterally with normal respiratory effort. CV: Nondisplaced PMI.  Heart regular S1/S2, no S3/S4, 2/6 early systolic murmur RUSB.  Trace ankle edema.  Bilateral carotid bruits.  Normal pedal pulses.  Abdomen: Soft, nontender, no hepatosplenomegaly, no distention.  Neurologic: Alert and oriented x 3.  Psych: Normal affect. Extremities: No clubbing or cyanosis.   Assessment/Plan: 1. Chronic  diastolic CHF: Patient does not appear significantly volume overloaded.  Mild NYHA class II symptoms.  She can stay off Lasix for now.  If she develops dyspnea, this can be restarted.  2. HTN: BP stable.  Goal for her age would be SBP < 150.  3. Carotid stenosis: Repeat carotids in 12/14.    4. Aortic stenosis: Mild, continue to follow over time.  5. Hyperlipidemia: Good lipids in 7/14.   Marca Ancona 01/12/2013

## 2013-01-17 ENCOUNTER — Telehealth: Payer: Self-pay | Admitting: Cardiology

## 2013-01-17 DIAGNOSIS — E785 Hyperlipidemia, unspecified: Secondary | ICD-10-CM

## 2013-01-17 MED ORDER — ATORVASTATIN CALCIUM 20 MG PO TABS: 20.0000 mg | ORAL_TABLET | Freq: Every day | ORAL | Status: DC

## 2013-01-17 NOTE — Telephone Encounter (Signed)
F/up  Pt is returning a call from a previous message// [facility] has the recommended ATORVASTATIN.. [facility] on [address].

## 2013-01-17 NOTE — Telephone Encounter (Signed)
Pt advised. She will call me back to schedule fasting lipid/liver profile for 2 months.

## 2013-01-17 NOTE — Telephone Encounter (Signed)
New Problem  Pravastatin is no longer on the medications list// it is expensive// needs a generic option// please advise.

## 2013-01-17 NOTE — Telephone Encounter (Signed)
I will forward to Dr Shirlee Latch for atorvastatin dose in the place of pravastatin 40mg .

## 2013-01-17 NOTE — Telephone Encounter (Signed)
Per Dr Tina Griffiths 20mg  daily in the place of pravastatin

## 2013-01-25 ENCOUNTER — Telehealth: Payer: Self-pay

## 2013-01-25 NOTE — Telephone Encounter (Signed)
Message copied by Chrystie Nose on Thu Jan 25, 2013 10:57 AM ------      Message from: Hilarie Fredrickson      Created: Tue Jan 23, 2013  4:33 PM      Regarding: RE: Labs       Her PCP can monitor blood counts as he sees fit. No need to be followed by GI at this point. Thanks       ----- Message -----         From: Lily Lovings, RN         Sent: 01/23/2013   1:47 PM           To: Hilarie Fredrickson, MD      Subject: FW: Labs                                                 Called pt and let her know she is due for CBC and Ferritin. Pt states she has been having lots of labs done by Dr. Shirlee Latch and Dr. Oneta Rack. Pt wants to know if she needs to have more done for Dr. Marina Goodell. States Medicare sent her a letter stating that she was having to many labs done and she owes 80.00. Please advise if she needs to have these done.            Thanks,      Bonita Quin      ----- Message -----         From: Lily Lovings, RN         Sent: 01/22/2013           To: Lily Lovings, RN      Subject: Labs                                                     Needs CBC and Ferritin in             ------

## 2013-01-25 NOTE — Telephone Encounter (Signed)
Spoke with pt and she is aware.

## 2013-02-08 ENCOUNTER — Emergency Department (HOSPITAL_COMMUNITY): Payer: Medicare Other

## 2013-02-08 ENCOUNTER — Encounter (HOSPITAL_COMMUNITY): Payer: Self-pay | Admitting: Emergency Medicine

## 2013-02-08 ENCOUNTER — Emergency Department (HOSPITAL_COMMUNITY)
Admission: EM | Admit: 2013-02-08 | Discharge: 2013-02-08 | Disposition: A | Discharge status: Home / Self Care | Payer: Medicare Other | Attending: Emergency Medicine | Admitting: Emergency Medicine

## 2013-02-08 DIAGNOSIS — E559 Vitamin D deficiency, unspecified: Secondary | ICD-10-CM | POA: Insufficient documentation

## 2013-02-08 DIAGNOSIS — Z79899 Other long term (current) drug therapy: Secondary | ICD-10-CM | POA: Insufficient documentation

## 2013-02-08 DIAGNOSIS — Z87828 Personal history of other (healed) physical injury and trauma: Secondary | ICD-10-CM | POA: Insufficient documentation

## 2013-02-08 DIAGNOSIS — I129 Hypertensive chronic kidney disease with stage 1 through stage 4 chronic kidney disease, or unspecified chronic kidney disease: Secondary | ICD-10-CM | POA: Insufficient documentation

## 2013-02-08 DIAGNOSIS — Z8719 Personal history of other diseases of the digestive system: Secondary | ICD-10-CM | POA: Insufficient documentation

## 2013-02-08 DIAGNOSIS — R51 Headache: Secondary | ICD-10-CM | POA: Insufficient documentation

## 2013-02-08 DIAGNOSIS — Z87891 Personal history of nicotine dependence: Secondary | ICD-10-CM | POA: Insufficient documentation

## 2013-02-08 DIAGNOSIS — IMO0002 Reserved for concepts with insufficient information to code with codable children: Secondary | ICD-10-CM | POA: Insufficient documentation

## 2013-02-08 DIAGNOSIS — M810 Age-related osteoporosis without current pathological fracture: Secondary | ICD-10-CM | POA: Insufficient documentation

## 2013-02-08 DIAGNOSIS — E039 Hypothyroidism, unspecified: Secondary | ICD-10-CM | POA: Insufficient documentation

## 2013-02-08 DIAGNOSIS — N189 Chronic kidney disease, unspecified: Secondary | ICD-10-CM | POA: Insufficient documentation

## 2013-02-08 DIAGNOSIS — Z8673 Personal history of transient ischemic attack (TIA), and cerebral infarction without residual deficits: Secondary | ICD-10-CM | POA: Insufficient documentation

## 2013-02-08 LAB — CBC WITH DIFFERENTIAL/PLATELET
Basophils Absolute: 0 10*3/uL (ref 0.0–0.1)
Eosinophils Absolute: 0.1 10*3/uL (ref 0.0–0.7)
Eosinophils Relative: 1 % (ref 0–5)
Lymphs Abs: 1.2 10*3/uL (ref 0.7–4.0)
MCH: 29.5 pg (ref 26.0–34.0)
MCV: 88.2 fL (ref 78.0–100.0)
Platelets: 223 10*3/uL (ref 150–400)
RDW: 13.7 % (ref 11.5–15.5)

## 2013-02-08 LAB — COMPREHENSIVE METABOLIC PANEL
ALT: 22 U/L (ref 0–35)
Calcium: 9.7 mg/dL (ref 8.4–10.5)
Creatinine, Ser: 1.4 mg/dL — ABNORMAL HIGH (ref 0.50–1.10)
GFR calc Af Amer: 38 mL/min — ABNORMAL LOW (ref >90)
Glucose, Bld: 101 mg/dL — ABNORMAL HIGH (ref 70–99)
Sodium: 141 mEq/L (ref 135–145)
Total Protein: 6.1 g/dL (ref 6.0–8.3)

## 2013-02-08 LAB — URINALYSIS, ROUTINE W REFLEX MICROSCOPIC
Glucose, UA: NEGATIVE mg/dL
Nitrite: NEGATIVE
Protein, ur: NEGATIVE mg/dL
Urobilinogen, UA: 0.2 mg/dL (ref 0.0–1.0)

## 2013-02-08 LAB — SEDIMENTATION RATE: Sed Rate: 10 mm/hr (ref 0–22)

## 2013-02-08 LAB — PROTIME-INR: INR: 1.06 (ref 0.00–1.49)

## 2013-02-08 NOTE — ED Provider Notes (Signed)
CSN: [id number]     Arrival date & time 02/08/13  1844 History   First MD Initiated Contact with Patient 02/08/13 1856     Chief Complaint  Patient presents with  . Headache   (Consider location/radiation/quality/duration/timing/severity/associated sxs/prior Treatment) HPI Patient presents with concerns of headache. This episode began today. She does not typically have headaches. Earlier today, without clear precipitant the patient developed a focal headache about the right temporal area.  The pain is mild, but persistent until ED arrival.  She did not take any medication for the pain. There is no concurrent visual changes, lightheadedness, 60, confusion, weakness anywhere. Patient's physician, was referred here for further evaluation, with concern for temporal arteritis  Past Medical History  Diagnosis Date  . Hypertension   . Senile osteoporosis   . Vitamin D deficiency   . Diverticulosis   . Hypothyroidism   . Atherosclerotic heart disease   . TIA (transient ischemic attack)   . CKD (chronic kidney disease)   . Inguinal hernia   . Broken hip     bilateral  . Complication of anesthesia     passed out after cataract surgery   Past Surgical History  Procedure Laterality Date  . Inguinal hernia repair    . Tonsillectomy    . Hip arthroplasty  04/01/2012    Procedure: ARTHROPLASTY BIPOLAR HIP;  Surgeon: [name], MD;  Location: [facility];  Service: Orthopedics;  Laterality: Left;  . Compression hip screw  05/06/2012    Procedure: COMPRESSION HIP;  Surgeon: Jodi Marble, MD;  Location: [facility] OR;  Service: Orthopedics;  Laterality: Right;  . Skin lesion excision      benignt; from shoulder  . Hip surgery      left  . Eye surgery      bilateral  . Eye surgery      tear duct R side   Family History  Problem Relation Age of Onset  . CVA Mother     died age 105  . Emphysema Sister   . Colon cancer Neg Hx    History  Substance Use Topics  . Smoking status: Former  Smoker    Types: Cigarettes    Quit date: 05/06/1971  . Smokeless tobacco: Never Used  . Alcohol Use: No     Comment: none since Nov 2013-was on 2 beers/night   OB History   Grav Para Term Preterm Abortions TAB SAB Ect Mult Living                 Review of Systems  Constitutional:       Per HPI, otherwise negative  HENT:       Per HPI, otherwise negative  Respiratory:       Per HPI, otherwise negative  Cardiovascular:       Per HPI, otherwise negative  Gastrointestinal: Negative for vomiting.  Endocrine:       Negative aside from HPI  Genitourinary:       Neg aside from HPI   Musculoskeletal:       Per HPI, otherwise negative  Skin: Negative.   Neurological: Negative for dizziness, tremors, seizures, syncope, facial asymmetry, speech difficulty, weakness, light-headedness and numbness.    Allergies  Review of patient's allergies indicates no known allergies.  Home Medications   Current Outpatient Rx  Name  Route  Sig  Dispense  Refill  . allopurinol (ZYLOPRIM) 100 MG tablet   Oral   Take 100 mg by mouth daily. Takes for  gout         . aspirin EC 325 MG tablet   Oral   Take 325 mg by mouth at bedtime.          Marland Kitchen atorvastatin (LIPITOR) 20 MG tablet   Oral   Take 1 tablet (20 mg total) by mouth daily.   30 tablet   3   . Biotin 1000 MCG tablet   Oral   Take 1,000 mcg by mouth daily.         . bisoprolol-hydrochlorothiazide (ZIAC) 10-6.25 MG per tablet   Oral   Take 1 tablet by mouth daily. 1 tab every morning per Jennifer(Pharmacist) @@ [facility]. 5.27.14 BC-CMA         . cholecalciferol (VITAMIN D) 1000 UNITS tablet   Oral   Take 1,000 Units by mouth daily.         . cloNIDine (CATAPRES) 0.1 MG tablet   Oral   Take 1 tablet (0.1 mg total) by mouth 2 (two) times daily.   60 tablet   6   . ferrous sulfate 325 (65 FE) MG tablet   Oral   Take 1 tablet (325 mg total) by mouth 2 (two) times daily.   60 tablet   5   .  levothyroxine (SYNTHROID, LEVOTHROID) 150 MCG tablet   Oral   Take 150 mcg by mouth daily before breakfast.         . Multiple Vitamin (MULTIVITAMIN WITH MINERALS) TABS   Oral   Take 1 tablet by mouth daily.         . predniSONE (DELTASONE) 5 MG tablet   Oral   Take 5-15 mg by mouth daily. For appetite enhancement         . zinc gluconate 50 MG tablet   Oral   Take 50 mg by mouth daily.         . furosemide (LASIX) 40 MG tablet   Oral   Take 40 mg by mouth daily.           BP 186/51  Pulse 59  Temp(Src) 98.2 F (36.8 C) (Oral)  Resp 18  SpO2 99% Physical Exam  Nursing note and vitals reviewed. Constitutional: She is oriented to person, place, and time. She appears well-developed and well-nourished. No distress.  HENT:  Head: Normocephalic and atraumatic.  No deformity, no tenderness to palpation about any temporal area  Eyes: Conjunctivae and EOM are normal. Right eye exhibits no discharge. Left eye exhibits no discharge.  Cardiovascular: Normal rate and regular rhythm.   Pulmonary/Chest: Effort normal and breath sounds normal. No stridor. No respiratory distress.  Abdominal: She exhibits no distension.  Musculoskeletal: She exhibits no edema.  Neurological: She is alert and oriented to person, place, and time. No cranial nerve deficit.  Skin: Skin is warm and dry.  Psychiatric: She has a normal mood and affect.    ED Course  Procedures (including critical care time) Labs Review Labs Reviewed  CBC WITH DIFFERENTIAL  COMPREHENSIVE METABOLIC PANEL  PROTIME-INR  URINALYSIS, ROUTINE W REFLEX MICROSCOPIC  SEDIMENTATION RATE   Imaging Review Ct Head Wo Contrast  02/08/2013   CLINICAL DATA:  79 year old female with headache.  EXAM: CT HEAD WITHOUT CONTRAST  TECHNIQUE: Contiguous axial images were obtained from the base of the skull through the vertex without intravenous contrast.  COMPARISON:  05/05/2012.  FINDINGS: No acute osseous abnormality identified.  Visualized paranasal sinuses and mastoids are clear. Stable orbits soft tissues. Visualized scalp soft  tissues are within normal limits.  Calcified atherosclerosis at the skull base. Cerebral volume is within normal limits for age. No midline shift, ventriculomegaly, mass effect, evidence of mass lesion, intracranial hemorrhage or evidence of cortically based acute infarction. Gray-white matter differentiation is within normal limits throughout the brain. No suspicious intracranial vascular hyperdensity.  IMPRESSION: Stable and normal for age non contrast CT appearance of the brain.   Electronically Signed   By: Augusto Gamble M.D.   On: 02/08/2013 19:50   After the initial evaluation I reviewed the patient's chart.  Pulse oximetry is 100% on room air normal Cardiac monitor has rate 75 sinus rhythm normal EKG is sinus rhythm, rate 66 with occasional PAC, otherwise unremarkable  Sedimentation rate is negligible. MDM  No diagnosis found. Patient presents with headache and some concern for temporal arteritis.  However, patient has no visual changes, and on my exam has no headache.  She is neurologically intact.  Absent any neurologic complaints from today, there is low suspicion for TIA.  Patient is taking full-strength aspirin for prophylaxis given her history of prior TIA.  With no new complaints, no distress, no neurologic findings, and with an unremarkable sedimentation rate, the patient is appropriate for discharge with close outpatient followup.    Gerhard Munch, MD 02/08/13 [phone]

## 2013-02-08 NOTE — ED Notes (Signed)
Pt states she is unable to provide urine sample at this time.

## 2013-02-08 NOTE — ED Notes (Signed)
Pt states she went to her PMD today with a headache. Pt was then told to come to ED for CT scan to rule out TIA. Pt states she took 2 325mg  ASA at home and her headache is improved. Pt denies chest pain, shortness of breath, nausea, weakness, or visual disturbances. Pt a/o x 4. Pt with no acute distress. Skin warm, dry. Family at bedside.

## 2013-02-14 ENCOUNTER — Other Ambulatory Visit (HOSPITAL_COMMUNITY): Payer: Self-pay | Admitting: Internal Medicine

## 2013-02-14 DIAGNOSIS — Z1231 Encounter for screening mammogram for malignant neoplasm of breast: Secondary | ICD-10-CM

## 2013-02-15 ENCOUNTER — Ambulatory Visit (HOSPITAL_COMMUNITY)
Admission: RE | Admit: 2013-02-15 | Discharge: 2013-02-15 | Disposition: A | Discharge status: Home / Self Care | Payer: Medicare Other | Source: Ambulatory Visit | Attending: Internal Medicine | Admitting: Internal Medicine

## 2013-02-15 DIAGNOSIS — Z1231 Encounter for screening mammogram for malignant neoplasm of breast: Secondary | ICD-10-CM | POA: Insufficient documentation

## 2013-04-09 ENCOUNTER — Telehealth: Payer: Self-pay | Admitting: *Deleted

## 2013-04-09 NOTE — Telephone Encounter (Signed)
Pt called wants a call back, needs someone to confirm dose of thyroid med.

## 2013-04-10 NOTE — Telephone Encounter (Signed)
Spoke with pt and confirmed Levothyroxine dose of qd.

## 2013-04-11 ENCOUNTER — Encounter: Payer: Self-pay | Admitting: Internal Medicine

## 2013-04-11 DIAGNOSIS — R7309 Other abnormal glucose: Secondary | ICD-10-CM | POA: Insufficient documentation

## 2013-04-11 DIAGNOSIS — E559 Vitamin D deficiency, unspecified: Secondary | ICD-10-CM | POA: Insufficient documentation

## 2013-04-11 DIAGNOSIS — I10 Essential (primary) hypertension: Secondary | ICD-10-CM | POA: Insufficient documentation

## 2013-04-11 DIAGNOSIS — M810 Age-related osteoporosis without current pathological fracture: Secondary | ICD-10-CM | POA: Insufficient documentation

## 2013-04-12 ENCOUNTER — Encounter: Payer: Self-pay | Admitting: Emergency Medicine

## 2013-04-12 ENCOUNTER — Ambulatory Visit: Payer: Medicare Other | Admitting: Emergency Medicine

## 2013-04-12 VITALS — BP 124/62 | HR 62 | Temp 97.8°F | Resp 16 | Wt 126.0 lb

## 2013-04-12 DIAGNOSIS — E782 Mixed hyperlipidemia: Secondary | ICD-10-CM

## 2013-04-12 DIAGNOSIS — R7309 Other abnormal glucose: Secondary | ICD-10-CM

## 2013-04-12 DIAGNOSIS — I10 Essential (primary) hypertension: Secondary | ICD-10-CM

## 2013-04-12 DIAGNOSIS — E039 Hypothyroidism, unspecified: Secondary | ICD-10-CM

## 2013-04-12 DIAGNOSIS — R351 Nocturia: Secondary | ICD-10-CM

## 2013-04-12 LAB — CBC WITH DIFFERENTIAL/PLATELET
Basophils Absolute: 0 10*3/uL (ref 0.0–0.1)
Eosinophils Relative: 1 % (ref 0–5)
HCT: 41.1 % (ref 36.0–46.0)
Lymphocytes Relative: 12 % (ref 12–46)
Lymphs Abs: 1.2 10*3/uL (ref 0.7–4.0)
MCV: 90.3 fL (ref 78.0–100.0)
Monocytes Absolute: 0.6 10*3/uL (ref 0.1–1.0)
Neutro Abs: 7.7 10*3/uL (ref 1.7–7.7)
Platelets: 279 10*3/uL (ref 150–400)
RBC: 4.55 MIL/uL (ref 3.87–5.11)
WBC: 9.6 10*3/uL (ref 4.0–10.5)

## 2013-04-12 LAB — BASIC METABOLIC PANEL WITH GFR
BUN: 29 mg/dL — ABNORMAL HIGH (ref 6–23)
CO2: 32 mEq/L (ref 19–32)
Calcium: 9.3 mg/dL (ref 8.4–10.5)
Creat: 1.36 mg/dL — ABNORMAL HIGH (ref 0.50–1.10)
Glucose, Bld: 74 mg/dL (ref 70–99)

## 2013-04-12 LAB — LIPID PANEL: Cholesterol: 124 mg/dL (ref 0–200)

## 2013-04-12 LAB — HEPATIC FUNCTION PANEL
ALT: 17 U/L (ref 0–35)
AST: 21 U/L (ref 0–37)
Albumin: 3.7 g/dL (ref 3.5–5.2)
Alkaline Phosphatase: 59 U/L (ref 39–117)

## 2013-04-12 LAB — HEMOGLOBIN A1C: Mean Plasma Glucose: 120 mg/dL — ABNORMAL HIGH (ref <117)

## 2013-04-12 NOTE — Progress Notes (Signed)
Subjective:    Patient ID: Rachael [name], female    DOB: 05-16-1924, 79 y.o.   MRN: [id number]  HPI Comments: 79 yo female presents for 3 month F/U for HTN, Cholesterol, Pre-Dm, D. Deficient, Thyroid recheck. Thyroid dose was changed from 150 to 125 at last office visit. She notes she has had increased daytime napping but no other unusual side effects. She also reports increased nocturia but thinks that may be from lasix dose later in the day. She D/C Aricept 2 days into trial due to N/V and does not wish to try again with SE profile. She notes her BP is fluctuating from 130s-200s, she usually is not able to tell when pressure changes. She takes and extra clonidine or 2 and the BP will come down. She notes she does not sleep well when she knows pressure is high, because she "worries about it". She is exercising with H2O aerobics 3 -4 times weekly. She is eating healthy, drinks 1 glass of cranberry juice daily, and occasional skips meals.  Hypertension  Hyperlipidemia    Current Outpatient Prescriptions on File Prior to Visit  Medication Sig Dispense Refill  . allopurinol (ZYLOPRIM) 100 MG tablet Take 100 mg by mouth daily. Takes for gout      . aspirin EC 325 MG tablet Take 325 mg by mouth at bedtime.       Marland Kitchen atorvastatin (LIPITOR) 20 MG tablet Take 1 tablet (20 mg total) by mouth daily.  30 tablet  3  . Biotin 1000 MCG tablet Take 1,000 mcg by mouth daily.      . bisoprolol-hydrochlorothiazide (ZIAC) 10-6.25 MG per tablet Take 1 tablet by mouth daily. 1 tab every morning per Jennifer(Pharmacist) @@ [facility]. 5.27.14 BC-CMA      . cholecalciferol (VITAMIN D) 1000 UNITS tablet Take 1,000 Units by mouth daily.      . cloNIDine (CATAPRES) 0.1 MG tablet Take 1 tablet (0.1 mg total) by mouth 2 (two) times daily.  60 tablet  6  . Multiple Vitamin (MULTIVITAMIN WITH MINERALS) TABS Take 1 tablet by mouth daily.      . predniSONE (DELTASONE) 5 MG tablet Take 5-15 mg by mouth daily. For  appetite enhancement      . zinc gluconate 50 MG tablet Take 50 mg by mouth daily.      . ferrous sulfate 325 (65 FE) MG tablet Take 1 tablet (325 mg total) by mouth 2 (two) times daily.  60 tablet  5   No current facility-administered medications on file prior to visit.   ALLERGIES Aricept; Prevacid; and Tylenol  Past Medical History  Diagnosis Date  . Vitamin D deficiency   . Diverticulosis   . Atherosclerotic heart disease   . TIA (transient ischemic attack)   . CKD (chronic kidney disease)   . Inguinal hernia   . Broken hip     bilateral  . Complication of anesthesia     passed out after cataract surgery  . Hypertension   . Hyperlipidemia   . Hypothyroidism   . Senile osteoporosis   . Osteopenia   . Vitamin D deficiency   . Elevated hemoglobin A1c      Review of Systems  Constitutional: Positive for fatigue.  Genitourinary: Positive for frequency.  All other systems reviewed and are negative.    BP 124/62  Pulse 62  Temp(Src) 97.8 F (36.6 C) (Temporal)  Resp 16  Wt 126 lb (57.153 kg)     Objective:   Physical  Exam  Vitals reviewed. Constitutional: She is oriented to person, place, and time. She appears well-developed and well-nourished.  HENT:  Head: Normocephalic and atraumatic.  Right Ear: External ear normal.  Left Ear: External ear normal.  Eyes: Pupils are equal, round, and reactive to light.  Neck: Normal range of motion.  Cardiovascular: Normal rate, regular rhythm, normal heart sounds and intact distal pulses.   Pulmonary/Chest: Effort normal and breath sounds normal.  Abdominal: Soft. Bowel sounds are normal.  Musculoskeletal: Normal range of motion.  Neurological: She is alert and oriented to person, place, and time.  Skin: Skin is warm and dry.  Psychiatric: She has a normal mood and affect. Judgment normal.          Assessment & Plan:  1. 3 month F/U for HTN, Cholesterol, Pre-Dm, D. Deficient, hypothyroid check labs. With BP  fluctuations try to increase Clonidine to TID and call with results 2. Urine frequency- check labs, probable Lasix related 3. Daytime napping- could be related to poor sleep with nocturia vs BP fluctuations vs BS with skipping meals. Advised increase protein,  better sleep hygiene, increase H2O and activity.  30 minute OV with multiple problems

## 2013-04-12 NOTE — Patient Instructions (Signed)
Hypertension Hypertension is another name for high blood pressure. High blood pressure may mean that your heart needs to work harder to pump blood. Blood pressure consists of two numbers, which includes a higher number over a lower number (example: 110/72). HOME CARE   Make lifestyle changes as told by your doctor. This may include weight loss and exercise.  Take your blood pressure medicine every day.  Limit how much salt you use.  Stop smoking if you smoke.  Do not use drugs.  Talk to your doctor if you are using decongestants or birth control pills. These medicines might make blood pressure higher.  Females should not drink more than 1 alcoholic drink per day. Males should not drink more than 2 alcoholic drinks per day.  See your doctor as told. GET HELP RIGHT AWAY IF:   You have a blood pressure reading with a top number of 180 or higher.  You get a very bad headache.  You get blurred or changing vision.  You feel confused.  You feel weak, numb, or faint.  You get chest or belly (abdominal) pain.  You throw up (vomit).  You cannot breathe very well. MAKE SURE YOU:   Understand these instructions.  Will watch your condition.  Will get help right away if you are not doing well or get worse. Document Released: 10/27/2007 Document Revised: 08/02/2011 Document Reviewed: 10/27/2007 ExitCare Patient Information 2014 [employer].  

## 2013-04-13 LAB — TSH: TSH: 22.545 u[IU]/mL — ABNORMAL HIGH (ref 0.350–4.500)

## 2013-04-13 LAB — URINE CULTURE: Colony Count: NO GROWTH

## 2013-04-13 LAB — URINALYSIS, ROUTINE W REFLEX MICROSCOPIC
Bilirubin Urine: NEGATIVE
Glucose, UA: NEGATIVE mg/dL
Hgb urine dipstick: NEGATIVE
Protein, ur: NEGATIVE mg/dL
Urobilinogen, UA: 1 mg/dL (ref 0.0–1.0)

## 2013-04-13 LAB — URINALYSIS, MICROSCOPIC ONLY
Bacteria, UA: NONE SEEN
Squamous Epithelial / LPF: NONE SEEN

## 2013-04-13 LAB — INSULIN, FASTING: Insulin fasting, serum: 26 u[IU]/mL (ref 3–28)

## 2013-04-16 ENCOUNTER — Telehealth: Payer: Self-pay | Admitting: *Deleted

## 2013-04-16 NOTE — Telephone Encounter (Signed)
Message copied by Laurence Spates on Mon Apr 16, 2013 12:26 PM ------      Message from: [place], [place] R      Created: Mon Apr 16, 2013  8:20 AM       NO UTI, kidney function elevated need to increase H2O, but has improved since last lab. Thyroid is not in range, make sure she is taking QD, if she is then needs to increase to 1.5 pills MWF 1 rest of week. Rest OK recheck 1 month OV TSH BMET ------

## 2013-04-17 NOTE — Telephone Encounter (Signed)
Spoke with patient about recent lab results and instructions to change Thyroid Rx as directed by Loree Fee, PA-C.  Patient will follow up in one month for recheck.

## 2013-04-23 ENCOUNTER — Other Ambulatory Visit: Payer: Self-pay | Admitting: Emergency Medicine

## 2013-04-23 MED ORDER — PREDNISONE 5 MG PO TABS: 5.0000 mg | ORAL_TABLET | Freq: Every day | ORAL | Status: DC

## 2013-05-14 ENCOUNTER — Other Ambulatory Visit: Payer: Self-pay | Admitting: Cardiology

## 2013-05-15 ENCOUNTER — Other Ambulatory Visit: Payer: Self-pay | Admitting: Cardiology

## 2013-05-18 ENCOUNTER — Telehealth: Payer: Self-pay | Admitting: *Deleted

## 2013-05-18 NOTE — Telephone Encounter (Signed)
called pt to see what they were using ointment for, due to the fact we recieved a refill request for Mupirocin, it was for her hand and pt has no need for it at this time.

## 2013-06-13 ENCOUNTER — Other Ambulatory Visit: Payer: Self-pay | Admitting: Cardiology

## 2013-06-14 ENCOUNTER — Other Ambulatory Visit: Payer: Self-pay

## 2013-06-14 MED ORDER — CLONIDINE HCL 0.1 MG PO TABS: ORAL_TABLET | ORAL | Status: DC

## 2013-06-15 ENCOUNTER — Telehealth: Payer: Self-pay

## 2013-06-15 MED ORDER — CLONIDINE HCL 0.1 MG PO TABS: 0.1000 mg | ORAL_TABLET | Freq: Three times a day (TID) | ORAL | Status: DC

## 2013-06-15 NOTE — Telephone Encounter (Signed)
Looks to me like BID - would verify for sure with the patient.

## 2013-06-15 NOTE — Telephone Encounter (Signed)
Spoke to patient she stated she takes Clonidine 0.1 mg three times a day.Prescription sent to pharmacy.

## 2013-06-15 NOTE — Telephone Encounter (Signed)
BID it looks like.

## 2013-07-12 ENCOUNTER — Other Ambulatory Visit: Payer: Self-pay | Admitting: *Deleted

## 2013-07-12 MED ORDER — ALLOPURINOL 100 MG PO TABS: 100.0000 mg | ORAL_TABLET | Freq: Every day | ORAL | Status: DC

## 2013-07-13 ENCOUNTER — Ambulatory Visit (INDEPENDENT_AMBULATORY_CARE_PROVIDER_SITE_OTHER): Payer: Medicare Other | Admitting: Podiatrist

## 2013-07-13 ENCOUNTER — Encounter: Payer: Self-pay | Admitting: Podiatrist

## 2013-07-13 ENCOUNTER — Other Ambulatory Visit: Payer: Self-pay | Admitting: *Deleted

## 2013-07-13 VITALS — BP 167/66 | HR 60 | Resp 12

## 2013-07-13 DIAGNOSIS — M79609 Pain in unspecified limb: Secondary | ICD-10-CM

## 2013-07-13 DIAGNOSIS — L03039 Cellulitis of unspecified toe: Secondary | ICD-10-CM

## 2013-07-13 DIAGNOSIS — B351 Tinea unguium: Secondary | ICD-10-CM

## 2013-07-13 DIAGNOSIS — R0989 Other specified symptoms and signs involving the circulatory and respiratory systems: Secondary | ICD-10-CM

## 2013-07-13 NOTE — Progress Notes (Signed)
''   RT FOOT GREAT TOENAIL IS START BOTHRING ME AGAIN AND IT HURT.''  Chief Complaint  Patient presents with  . Nail Problem    '' RT FOOT GREAT TOENAIL IS START BOTHRING ME AGAIN AND IT HURT.''     HPI: Patient is 80 y.o. female who presents today for followup of pain on the right great toenail. She states it's beginning to hurt her again. It's going into her skin and causing her discomfort with shoes.     Physical Exam  GENERAL APPEARANCE: Alert, conversant. Appropriately groomed. No acute distress.  VASCULAR: Pedal pulses faintly palpable at 1/4 dp and pt bilateral.  Capillary refill time is immediate to all digits,  Proximal to distal cooling it warm to warm.   NEUROLOGIC: sensation is intact epicritically and protectively to 5.07 monofilament at 5/5 sites bilateral.  Light touch is intact bilateral, vibratory sensation intact bilateral, achilles tendon reflex is intact bilateral.  MUSCULOSKELETAL: acceptable muscle strength, tone and stability bilateral.  Intrinsic muscluature intact bilateral.  Rectus appearance of foot and digits noted bilateral.   DERMATOLOGIC: Right great toenail is elongated and growing into the distal tip of the toe on the lateral side of the digit. No redness, no swelling, no pus, no pirulence is seen. Elongated toenail with ingrowing deformity is present. Assessment: Paronychia, mycotic toenail, pain  Plan: Debrided the toenail without anesthesia and without complication. Recommended at routine vascular study to assess circulation to the toe. Discussed we could potentially consider a permanent phenol matrixectomy however I would have to no the circulation status of the toe before proceeding. Will notify her of the results of the study and we can go from there Haygen Zebrowski P

## 2013-07-17 ENCOUNTER — Encounter: Payer: Self-pay | Admitting: Internal Medicine

## 2013-07-21 ENCOUNTER — Other Ambulatory Visit: Payer: Self-pay | Admitting: Cardiology

## 2013-08-06 ENCOUNTER — Other Ambulatory Visit: Payer: Self-pay | Admitting: Emergency Medicine

## 2013-08-24 ENCOUNTER — Encounter: Payer: Self-pay | Admitting: Internal Medicine

## 2013-08-26 ENCOUNTER — Other Ambulatory Visit: Payer: Self-pay | Admitting: Emergency Medicine

## 2013-08-27 ENCOUNTER — Encounter: Payer: Self-pay | Admitting: Internal Medicine

## 2013-08-28 ENCOUNTER — Encounter: Payer: Self-pay | Admitting: Internal Medicine

## 2013-08-28 ENCOUNTER — Ambulatory Visit (INDEPENDENT_AMBULATORY_CARE_PROVIDER_SITE_OTHER): Payer: Medicare Other | Admitting: Internal Medicine

## 2013-08-28 VITALS — BP 146/80 | HR 64 | Temp 98.8°F | Resp 16 | Ht 68.5 in | Wt 133.2 lb

## 2013-08-28 DIAGNOSIS — W010XXA Fall on same level from slipping, tripping and stumbling without subsequent striking against object, initial encounter: Secondary | ICD-10-CM

## 2013-08-28 DIAGNOSIS — R7309 Other abnormal glucose: Secondary | ICD-10-CM

## 2013-08-28 DIAGNOSIS — Z1212 Encounter for screening for malignant neoplasm of rectum: Secondary | ICD-10-CM

## 2013-08-28 DIAGNOSIS — E039 Hypothyroidism, unspecified: Secondary | ICD-10-CM | POA: Insufficient documentation

## 2013-08-28 DIAGNOSIS — E785 Hyperlipidemia, unspecified: Secondary | ICD-10-CM

## 2013-08-28 DIAGNOSIS — I10 Essential (primary) hypertension: Secondary | ICD-10-CM

## 2013-08-28 DIAGNOSIS — E559 Vitamin D deficiency, unspecified: Secondary | ICD-10-CM

## 2013-08-28 DIAGNOSIS — Z1331 Encounter for screening for depression: Secondary | ICD-10-CM

## 2013-08-28 DIAGNOSIS — Z79899 Other long term (current) drug therapy: Secondary | ICD-10-CM | POA: Insufficient documentation

## 2013-08-28 LAB — LIPID PANEL
CHOL/HDL RATIO: 2.6 ratio
Cholesterol: 131 mg/dL (ref 0–200)
HDL: 51 mg/dL (ref >39)
LDL Cholesterol: 63 mg/dL (ref 0–99)
Triglycerides: 85 mg/dL (ref <150)
VLDL: 17 mg/dL (ref 0–40)

## 2013-08-28 LAB — BASIC METABOLIC PANEL WITH GFR
BUN: 36 mg/dL — ABNORMAL HIGH (ref 6–23)
CALCIUM: 9.1 mg/dL (ref 8.4–10.5)
CO2: 28 mEq/L (ref 19–32)
CREATININE: 1.2 mg/dL — AB (ref 0.50–1.10)
Chloride: 102 mEq/L (ref 96–112)
GFR, EST AFRICAN AMERICAN: 47 mL/min — AB
GFR, Est Non African American: 41 mL/min — ABNORMAL LOW
GLUCOSE: 143 mg/dL — AB (ref 70–99)
Potassium: 4.1 mEq/L (ref 3.5–5.3)
SODIUM: 139 meq/L (ref 135–145)

## 2013-08-28 LAB — CBC WITH DIFFERENTIAL/PLATELET
BASOS PCT: 0 % (ref 0–1)
Basophils Absolute: 0 10*3/uL (ref 0.0–0.1)
EOS ABS: 0 10*3/uL (ref 0.0–0.7)
Eosinophils Relative: 0 % (ref 0–5)
HCT: 35.7 % — ABNORMAL LOW (ref 36.0–46.0)
Hemoglobin: 12.4 g/dL (ref 12.0–15.0)
Lymphocytes Relative: 9 % — ABNORMAL LOW (ref 12–46)
Lymphs Abs: 0.9 10*3/uL (ref 0.7–4.0)
MCH: 30.8 pg (ref 26.0–34.0)
MCHC: 34.7 g/dL (ref 30.0–36.0)
MCV: 88.8 fL (ref 78.0–100.0)
MONOS PCT: 4 % (ref 3–12)
Monocytes Absolute: 0.4 10*3/uL (ref 0.1–1.0)
NEUTROS PCT: 87 % — AB (ref 43–77)
Neutro Abs: 8.4 10*3/uL — ABNORMAL HIGH (ref 1.7–7.7)
PLATELETS: 261 10*3/uL (ref 150–400)
RBC: 4.02 MIL/uL (ref 3.87–5.11)
RDW: 13.5 % (ref 11.5–15.5)
WBC: 9.6 10*3/uL (ref 4.0–10.5)

## 2013-08-28 LAB — MAGNESIUM: MAGNESIUM: 1.9 mg/dL (ref 1.5–2.5)

## 2013-08-28 LAB — HEPATIC FUNCTION PANEL
ALBUMIN: 3.9 g/dL (ref 3.5–5.2)
ALT: 24 U/L (ref 0–35)
AST: 22 U/L (ref 0–37)
Alkaline Phosphatase: 61 U/L (ref 39–117)
BILIRUBIN DIRECT: 0.1 mg/dL (ref 0.0–0.3)
Indirect Bilirubin: 0.4 mg/dL (ref 0.2–1.2)
Total Bilirubin: 0.5 mg/dL (ref 0.2–1.2)
Total Protein: 5.7 g/dL — ABNORMAL LOW (ref 6.0–8.3)

## 2013-08-28 LAB — TSH: TSH: 1.834 u[IU]/mL (ref 0.350–4.500)

## 2013-08-28 NOTE — Patient Instructions (Signed)

## 2013-08-28 NOTE — Progress Notes (Signed)
Patient ID: Rachael [name], female   DOB: 1924/05/04, 80 y.o.   MRN: [id number]   Annual Screening Comprehensive Examination  This very nice 80 y.o. WWF presents for complete physical.  Patient has been followed for HTN, Diabetes  Prediabetes, Hyperlipidemia, and Vitamin D Deficiency.    HTN predates since 34. Patient's BP has been controlled at home. Today's BP: 146/80 mmHg. Patient has Stage III CKD with GFR 40 ml/min. Patient denies any cardiac symptoms as chest pain, palpitations, shortness of breath, dizziness or ankle swelling.   Patient's hyperlipidemia is controlled with diet and medications. Patient denies myalgias or other medication SE's. Last cholesterol last visit was 124, triglycerides 94, HDL 48 and LDL 57 in Nov 2014 - all at goal.    Patient has prediabetes with A1c 5.8%  predating since 2011 with last A1c 5.8% in Nov 2014. Patient denies reactive hypoglycemic symptoms, visual blurring, diabetic polys, or paresthesias.    Finally, patient has history of Vitamin D Deficiency  Of 66 in 2008 with last vitamin D 74 in Aug 2014.  Medication Sig  . allopurinol (ZYLOPRIM) 100 MG tablet Take 1 tablet (100 mg total) by mouth daily. Takes for gout  . aspirin EC 325 MG tablet Take 325 mg by mouth at bedtime.   Marland Kitchen atorvastatin (LIPITOR) 20 MG tablet TAKE 1 TABLET (20 MG TOTAL) BY MOUTH DAILY.  Marland Kitchen Biotin 1000 MCG tablet Take 1,000 mcg by mouth daily.  . bisoprolol-hydrochlorothiazide (ZIAC) 10-6.25 MG per tablet Take 1 tablet by mouth daily  . cholecalciferol (VITAMIN D) 1000 UNITS tablet Take 1,000 Units by mouth daily.  . cloNIDine (CATAPRES) 0.1 MG tablet Take 1 tablet (0.1 mg total) by mouth 3 (three) times daily.  . furosemide (LASIX) 40 MG tablet Take 40 mg by mouth. 1/2  To 1 pill three times per week.  . levothyroxine (SYNTHROID, LEVOTHROID) 125 MCG tablet Take 125 mcg by mouth daily before breakfast.  . Multiple Vitamin (MULTIVITAMIN WITH MINERALS) TABS Take 1 tablet by mouth  daily.  . predniSONE (DELTASONE) 5 MG tablet TAKE 1-3 TABLETS  BY MOUTH DAILY. FOR APPETITE   . zinc gluconate 50 MG tablet Take 50 mg by mouth daily.   Allergies  Allergen Reactions  . Aricept [Donepezil Hcl]     Nausea and vomiting  . Prevacid [Lansoprazole]     GI upset  . Tylenol [Acetaminophen]     Tylenol #3 - Nausea    Past Medical History  Diagnosis Date  . Vitamin D deficiency   . Diverticulosis   . Atherosclerotic heart disease   . TIA (transient ischemic attack)   . CKD (chronic kidney disease)   . Inguinal hernia   . Broken hip     bilateral  . Complication of anesthesia     passed out after cataract surgery  . Hypertension   . Hyperlipidemia   . Hypothyroidism   . Senile osteoporosis   . Osteopenia   . Vitamin D deficiency   . Elevated hemoglobin A1c     Past Surgical History  Procedure Laterality Date  . Inguinal hernia repair    . Tonsillectomy    . Hip arthroplasty  04/01/2012    Procedure: ARTHROPLASTY BIPOLAR HIP;  Surgeon: Kerin Salen, MD;  Location: [facility];  Service: Orthopedics;  Laterality: Left;  . Compression hip screw  05/06/2012    Procedure: COMPRESSION HIP;  Surgeon: Jolyn Nap, MD;  Location: [facility];  Service: Orthopedics;  Laterality: Right;  . Skin  lesion excision      benignt; from shoulder  . Hip surgery      left  . Eye surgery      bilateral  . Eye surgery      tear duct R side  . Blepharoplasty  1983  . Melanoma excision  1991    Right Arm  . Total hip arthroplasty Right 2013  . Total hip arthroplasty Left 2013    Family History  Problem Relation Age of Onset  . CVA Mother     died age 16  . Emphysema Sister   . Colon cancer Neg Hx     History  Substance Use Topics  . Smoking status: Former Smoker    Types: Cigarettes    Quit date: 05/06/1971  . Smokeless tobacco: Never Used  . Alcohol Use: No     Comment: none since Nov 2013-was on 2 beers/night    ROS Constitutional: Denies fever, chills, weight  loss/gain, headaches, insomnia, fatigue, night sweats, and change in appetite. Eyes: Denies redness, blurred vision, diplopia, discharge, itchy, watery eyes.  ENT: Denies discharge, congestion, post nasal drip, epistaxis, sore throat, earache, hearing loss, dental pain, Tinnitus, Vertigo, Sinus pain, snoring.  Cardio: Denies chest pain, palpitations, irregular heartbeat, syncope, dyspnea, diaphoresis, orthopnea, PND, claudication, edema Respiratory: denies cough, dyspnea, DOE, pleurisy, hoarseness, laryngitis, wheezing.  Gastrointestinal: Denies dysphagia, heartburn, reflux, water brash, pain, cramps, nausea, vomiting, bloating, diarrhea, constipation, hematemesis, melena, hematochezia, jaundice, hemorrhoids Genitourinary: Denies dysuria, frequency, urgency, nocturia, hesitancy, discharge, hematuria, flank pain Breast:Breast lumps, nipple discharge, bleeding.  Musculoskeletal: Denies arthralgia, myalgia, stiffness, Jt. Swelling, pain, limp, and strain/sprain. Skin: Denies puritis, rash, hives, warts, acne, eczema, changing in skin lesion Neuro: No weakness, tremor, incoordination, spasms, paresthesia, pain Psychiatric: Denies confusion, memory loss, sensory loss Endocrine: Denies change in weight, skin, hair change, nocturia, and paresthesia, diabetic polys, visual blurring, hyper / hypo glycemic episodes.  Heme/Lymph: No excessive bleeding, bruising, enlarged lymph nodes.   Physical Exam    BP 146/80  Pulse 64  Temp 98.8 F   Resp 16  Ht 5' 8.5"   Wt 133 lb 3.2 oz  BMI 19.96 kg/m2  General Appearance: Well nourished, in no apparent distress. Eyes: PERRLA, EOMs, conjunctiva no swelling or erythema, normal fundi and vessels. Sinuses: No frontal/maxillary tenderness ENT/Mouth: EACs patent / TMs  nl. Nares clear without erythema, swelling, mucoid exudates. Oral hygiene is good. No erythema, swelling, or exudate. Tongue normal, non-obstructing. Tonsils not swollen or erythematous. Hearing  normal.  Neck: Supple, thyroid normal. No bruits, nodes or JVD. Respiratory: Respiratory effort normal.  BS equal and clear bilateral without rales, rhonci, wheezing or stridor. Cardio: Heart sounds are normal with regular rate and rhythm and no murmurs, rubs or gallops. Peripheral pulses are normal and equal bilaterally without edema. No aortic or femoral bruits. Chest: symmetric with normal excursions and percussion. Breasts: Symmetric, without lumps, nipple discharge, retractions, or fibrocystic changes.  Abdomen: Flat, soft, with bowl sounds. Nontender, no guarding, rebound, hernias, masses, or organomegaly.  Lymphatics: Non tender without lymphadenopathy.  Genitourinary:  Musculoskeletal: Full ROM all peripheral extremities, joint stability, 5/5 strength, and normal gait. Skin: Warm and dry without rashes, lesions, cyanosis, clubbing or  ecchymosis.  Neuro: Cranial nerves intact, reflexes equal bilaterally. Normal muscle tone, no cerebellar symptoms. Sensation intact.  Pysch: Awake and oriented X 3, normal affect, Insight and Judgment appropriate.   Assessment and Plan  1. Annual Screening Examination 2. Hypertension  3. Hyperlipidemia 4. Pre Diabetes 5. Vitamin D  Deficiency  Continue prudent diet as discussed, weight control, BP monitoring, regular exercise, and medications. Discussed med's effects and SE's. Screening labs and tests as requested with regular follow-up as recommended. Patient was advised slow taper off Prednisone 5 mg to qod  X 1 mo, then q3d x 2sd month, then D/C.

## 2013-08-29 LAB — URINALYSIS, MICROSCOPIC ONLY
BACTERIA UA: NONE SEEN
Casts: NONE SEEN
Crystals: NONE SEEN
Squamous Epithelial / LPF: NONE SEEN

## 2013-08-29 LAB — HEMOGLOBIN A1C
HEMOGLOBIN A1C: 5.9 % — AB (ref <5.7)
Mean Plasma Glucose: 123 mg/dL — ABNORMAL HIGH (ref <117)

## 2013-08-29 LAB — MICROALBUMIN / CREATININE URINE RATIO
CREATININE, URINE: 73.3 mg/dL
MICROALB UR: 1 mg/dL (ref 0.00–1.89)
Microalb Creat Ratio: 13.6 mg/g (ref 0.0–30.0)

## 2013-08-29 LAB — VITAMIN D 25 HYDROXY (VIT D DEFICIENCY, FRACTURES): VIT D 25 HYDROXY: 76 ng/mL (ref 30–89)

## 2013-09-05 ENCOUNTER — Telehealth: Payer: Self-pay | Admitting: *Deleted

## 2013-09-05 ENCOUNTER — Other Ambulatory Visit: Payer: Self-pay | Admitting: Internal Medicine

## 2013-09-05 NOTE — Telephone Encounter (Signed)
Patient aware of results.

## 2013-09-11 NOTE — Telephone Encounter (Signed)
error 

## 2013-09-25 ENCOUNTER — Other Ambulatory Visit: Payer: Self-pay | Admitting: Cardiology

## 2013-09-25 ENCOUNTER — Other Ambulatory Visit: Payer: Self-pay

## 2013-09-25 MED ORDER — ATORVASTATIN CALCIUM 20 MG PO TABS: ORAL_TABLET | ORAL | Status: DC

## 2013-09-26 ENCOUNTER — Other Ambulatory Visit: Payer: Self-pay | Admitting: Internal Medicine

## 2013-09-26 ENCOUNTER — Telehealth: Payer: Self-pay | Admitting: *Deleted

## 2013-09-26 NOTE — Telephone Encounter (Signed)
Patient called in regard to edema in both feet.  Per Dr Melford Aase, she can increase her furosemide to 2-3 a day til swelling goes down and then return to regular dosing.  Left message to inform patient at her request.

## 2013-10-12 ENCOUNTER — Other Ambulatory Visit: Payer: Self-pay | Admitting: *Deleted

## 2013-10-12 MED ORDER — ALLOPURINOL 100 MG PO TABS: 100.0000 mg | ORAL_TABLET | Freq: Every day | ORAL | Status: DC

## 2013-10-22 ENCOUNTER — Other Ambulatory Visit: Payer: Self-pay | Admitting: Cardiology

## 2013-10-22 DIAGNOSIS — K2970 Gastritis, unspecified, without bleeding: Secondary | ICD-10-CM

## 2013-10-22 HISTORY — DX: Gastritis, unspecified, without bleeding: K29.70

## 2013-10-23 ENCOUNTER — Other Ambulatory Visit: Payer: Self-pay

## 2013-10-23 MED ORDER — ATORVASTATIN CALCIUM 20 MG PO TABS: ORAL_TABLET | ORAL | Status: DC

## 2013-11-08 ENCOUNTER — Ambulatory Visit (INDEPENDENT_AMBULATORY_CARE_PROVIDER_SITE_OTHER): Payer: Medicare Other | Admitting: Emergency Medicine

## 2013-11-08 ENCOUNTER — Encounter: Payer: Self-pay | Admitting: Emergency Medicine

## 2013-11-08 VITALS — BP 170/74 | HR 68 | Temp 98.4°F | Resp 16 | Ht 68.5 in | Wt 131.0 lb

## 2013-11-08 DIAGNOSIS — E039 Hypothyroidism, unspecified: Secondary | ICD-10-CM

## 2013-11-08 DIAGNOSIS — K5900 Constipation, unspecified: Secondary | ICD-10-CM

## 2013-11-08 DIAGNOSIS — K625 Hemorrhage of anus and rectum: Secondary | ICD-10-CM

## 2013-11-08 LAB — CBC WITH DIFFERENTIAL/PLATELET
Basophils Absolute: 0 10*3/uL (ref 0.0–0.1)
EOS ABS: 0 10*3/uL (ref 0.0–0.7)
HEMATOCRIT: 33.3 % — AB (ref 36.0–46.0)
HEMOGLOBIN: 11.1 g/dL — AB (ref 12.0–15.0)
LYMPHS ABS: 1.1 10*3/uL (ref 0.7–4.0)
LYMPHS PCT: 20 % (ref 12–46)
MCH: 30.7 pg (ref 26.0–34.0)
MCHC: 33.2 g/dL (ref 30.0–36.0)
MCV: 92.1 fL (ref 78.0–100.0)
MONOS PCT: 5 % (ref 3–12)
Monocytes Absolute: 0.3 10*3/uL (ref 0.1–1.0)
NEUTROS ABS: 4.3 10*3/uL (ref 1.7–7.7)
Neutrophils Relative %: 75 % (ref 43–77)
Platelets: 181 10*3/uL (ref 150–400)
RBC: 3.62 MIL/uL — AB (ref 3.87–5.11)
RDW: 12.8 % (ref 11.5–15.5)
WBC: 5.7 10*3/uL (ref 4.0–10.5)

## 2013-11-08 LAB — BASIC METABOLIC PANEL WITH GFR
BUN: 43 mg/dL — ABNORMAL HIGH (ref 6–23)
CALCIUM: 9.6 mg/dL (ref 8.4–10.5)
CHLORIDE: 101 meq/L (ref 96–112)
CO2: 30 meq/L (ref 19–32)
Creat: 1.7 mg/dL — ABNORMAL HIGH (ref 0.50–1.10)
GFR, Est African American: 31 mL/min — ABNORMAL LOW
GFR, Est Non African American: 27 mL/min — ABNORMAL LOW
GLUCOSE: 159 mg/dL — AB (ref 70–99)
Potassium: 3.6 mEq/L (ref 3.5–5.3)
SODIUM: 142 meq/L (ref 135–145)

## 2013-11-08 LAB — HEPATIC FUNCTION PANEL
ALBUMIN: 4 g/dL (ref 3.5–5.2)
ALK PHOS: 77 U/L (ref 39–117)
ALT: 24 U/L (ref 0–35)
AST: 27 U/L (ref 0–37)
BILIRUBIN TOTAL: 0.6 mg/dL (ref 0.2–1.2)
Bilirubin, Direct: 0.2 mg/dL (ref 0.0–0.3)
Indirect Bilirubin: 0.4 mg/dL (ref 0.2–1.2)
Total Protein: 5.8 g/dL — ABNORMAL LOW (ref 6.0–8.3)

## 2013-11-08 LAB — TSH: TSH: 2.325 u[IU]/mL (ref 0.350–4.500)

## 2013-11-08 NOTE — Patient Instructions (Signed)
Rectal Bleeding ° Rectal bleeding is when blood comes out of the opening of the butt (anus). Rectal bleeding may show up as bright red blood or really dark poop (stool). The poop may look dark red, maroon, or black. Rectal bleeding is often a sign that something is wrong. This needs to be checked by a doctor.  °HOME CARE °· Eat a diet high in fiber. This will help keep your poop soft. °· Limit activitiy. °· Drink enough fluids to keep your pee (urine) clear or pale yellow. °· Take a warm bath to soothe any pain. °· Follow up with your doctor as told. °GET HELP RIGHT AWAY IF: °· You have more bleeding. °· You have black or dark red poop. °· You throw up (vomit) blood or it looks like coffee grounds. °· You have belly (abdominal) pain or tenderness. °· You have a fever. °· You feel weak, sick to your stomach (nauseous), or you pass out (faint). °· You have pain that is so bad you cannot poop (bowel movement). °MAKE SURE YOU: °· Understand these instructions. °· Will watch your condition. °· Will get help right away if you are not doing well or get worse. °Document Released: 01/20/2011 Document Revised: 08/02/2011 Document Reviewed: 01/20/2011 °ExitCare® Patient Information ©2015 [employer]. This information is not intended to replace advice given to you by your health care Yazmen Briones. Make sure you discuss any questions you have with your health care Kynlie Jane. ° °

## 2013-11-08 NOTE — Progress Notes (Signed)
Subjective:    Patient ID: Rachael [name], female    DOB: 10/06/1923, 80 y.o.   MRN: [id number]  HPI Comments: 80 yo WF with dark blood in underwear on Tuesday and then restarted this morning. She denies hemorrhoids. She notes BM large and hard. She stays tired and has been straining with BM. She notes occasionally with bowel incontinence with hard bowels. DR Henrene Pastor Colonoscopy 2009. She has been off prednisone x 1 month.  Rectal Bleeding      Medication List       This list is accurate as of: 11/08/13 10:33 AM.  Always use your most recent med list.               allopurinol 100 MG tablet  Commonly known as:  ZYLOPRIM  Take 1 tablet (100 mg total) by mouth daily. Takes for gout     aspirin EC 325 MG tablet  Take 325 mg by mouth at bedtime.     atorvastatin 20 MG tablet  Commonly known as:  LIPITOR  TAKE 1 TABLET (20 MG TOTAL) BY MOUTH DAILY.     Biotin 1000 MCG tablet  Take 1,000 mcg by mouth daily.     bisoprolol-hydrochlorothiazide 5-6.25 MG per tablet  Commonly known as:  ZIAC  TAKE 1 TABLET BY MOUTH EVERY MORNING     CALCIUM ANTACID 500 MG chewable tablet  Generic drug:  calcium carbonate  Chew 1 tablet by mouth 2 (two) times daily.     cholecalciferol 1000 UNITS tablet  Commonly known as:  VITAMIN D  Take 1,000 Units by mouth daily.     cloNIDine 0.1 MG tablet  Commonly known as:  CATAPRES  Take 1 tablet (0.1 mg total) by mouth 3 (three) times daily.     furosemide 40 MG tablet  Commonly known as:  LASIX  Take 40 mg by mouth. 1/2  To 1 pill three times per week.  Pt states "fluctuates" Rx dose     levothyroxine 125 MCG tablet  Commonly known as:  SYNTHROID, LEVOTHROID  Take 125 mcg by mouth daily before breakfast.     multivitamin with minerals Tabs tablet  Take 1 tablet by mouth daily.     predniSONE 5 MG tablet  Commonly known as:  DELTASONE  TAKE 1-3 TABLETS (5-15 MG TOTAL) BY MOUTH DAILY. FOR APPETITE ENHANCEMENT     zinc gluconate 50 MG  tablet  Take 50 mg by mouth daily.       Allergies  Allergen Reactions  . Aricept [Donepezil Hcl]     Nausea and vomiting  . Prevacid [Lansoprazole]     GI upset  . Tylenol [Acetaminophen]     Tylenol #3 - Nausea   Past Medical History  Diagnosis Date  . Vitamin D deficiency   . Diverticulosis   . Atherosclerotic heart disease   . TIA (transient ischemic attack)   . CKD (chronic kidney disease)   . Inguinal hernia   . Broken hip     bilateral  . Complication of anesthesia     passed out after cataract surgery  . Hypertension   . Hyperlipidemia   . Hypothyroidism   . Senile osteoporosis   . Osteopenia   . Vitamin D deficiency   . Elevated hemoglobin A1c       Review of Systems  Constitutional: Positive for fatigue.  Gastrointestinal: Positive for blood in stool and hematochezia.  All other systems reviewed and are negative. BP 170/74  Pulse 68  Temp(Src) 98.4 F (36.9 C) (Temporal)  Resp 16  Ht 5' 8.5" (1.74 m)  Wt 131 lb (59.421 kg)  BMI 19.63 kg/m2  Recheck 160/70    Objective:   Physical Exam  Nursing note and vitals reviewed. Constitutional: She is oriented to person, place, and time. She appears well-developed and well-nourished.  HENT:  Head: Normocephalic and atraumatic.  Eyes: Conjunctivae are normal.  Neck: Normal range of motion.  Cardiovascular: Normal rate, regular rhythm, normal heart sounds and intact distal pulses.   Bilateral le edema + 1  Pulmonary/Chest: Effort normal and breath sounds normal.  Abdominal: Soft. Bowel sounds are normal. She exhibits no distension and no mass. There is no tenderness. There is no rebound and no guarding.  Genitourinary: Guaiac positive stool.  Decreased rectal tone  Musculoskeletal: Normal range of motion.  Neurological: She is alert and oriented to person, place, and time.  Skin: Skin is warm and dry.  Psychiatric: She has a normal mood and affect. Judgment normal.          Assessment & Plan:   1. HTN- Check BP call if >130/80, increase cardio with edema increase LASIX  To 1/2 BID  2. Fatigue/ Dark Blood in stools/ constipation ? Tear vs colon abnormal with hemmocult + ADvised ER if any increased symptoms. Hold asa/ pred. GI EVAL STAT

## 2013-11-09 ENCOUNTER — Telehealth: Payer: Self-pay | Admitting: Internal Medicine

## 2013-11-09 NOTE — Telephone Encounter (Signed)
Left message for Rachael [name] to call back.  

## 2013-11-11 ENCOUNTER — Encounter (HOSPITAL_COMMUNITY): Payer: Self-pay | Admitting: Emergency Medicine

## 2013-11-11 ENCOUNTER — Inpatient Hospital Stay (HOSPITAL_COMMUNITY): Payer: Medicare Other

## 2013-11-11 ENCOUNTER — Inpatient Hospital Stay (HOSPITAL_COMMUNITY)
Admission: EM | Admit: 2013-11-11 | Discharge: 2013-11-15 | DRG: 377 | Disposition: A | Discharge status: Home Health | Payer: Medicare Other | Attending: Internal Medicine | Admitting: Internal Medicine

## 2013-11-11 DIAGNOSIS — T39095A Adverse effect of salicylates, initial encounter: Secondary | ICD-10-CM | POA: Diagnosis present

## 2013-11-11 DIAGNOSIS — I509 Heart failure, unspecified: Secondary | ICD-10-CM

## 2013-11-11 DIAGNOSIS — R609 Edema, unspecified: Secondary | ICD-10-CM | POA: Diagnosis present

## 2013-11-11 DIAGNOSIS — I10 Essential (primary) hypertension: Secondary | ICD-10-CM

## 2013-11-11 DIAGNOSIS — I498 Other specified cardiac arrhythmias: Secondary | ICD-10-CM

## 2013-11-11 DIAGNOSIS — D500 Iron deficiency anemia secondary to blood loss (chronic): Secondary | ICD-10-CM | POA: Diagnosis present

## 2013-11-11 DIAGNOSIS — I87309 Chronic venous hypertension (idiopathic) without complications of unspecified lower extremity: Secondary | ICD-10-CM

## 2013-11-11 DIAGNOSIS — D131 Benign neoplasm of stomach: Secondary | ICD-10-CM | POA: Diagnosis present

## 2013-11-11 DIAGNOSIS — K921 Melena: Secondary | ICD-10-CM

## 2013-11-11 DIAGNOSIS — I2510 Atherosclerotic heart disease of native coronary artery without angina pectoris: Secondary | ICD-10-CM

## 2013-11-11 DIAGNOSIS — K2960 Other gastritis without bleeding: Secondary | ICD-10-CM

## 2013-11-11 DIAGNOSIS — Z96649 Presence of unspecified artificial hip joint: Secondary | ICD-10-CM

## 2013-11-11 DIAGNOSIS — E559 Vitamin D deficiency, unspecified: Secondary | ICD-10-CM

## 2013-11-11 DIAGNOSIS — Z87891 Personal history of nicotine dependence: Secondary | ICD-10-CM

## 2013-11-11 DIAGNOSIS — N183 Chronic kidney disease, stage 3 unspecified: Secondary | ICD-10-CM | POA: Diagnosis present

## 2013-11-11 DIAGNOSIS — I129 Hypertensive chronic kidney disease with stage 1 through stage 4 chronic kidney disease, or unspecified chronic kidney disease: Secondary | ICD-10-CM | POA: Diagnosis present

## 2013-11-11 DIAGNOSIS — I5032 Chronic diastolic (congestive) heart failure: Secondary | ICD-10-CM | POA: Diagnosis present

## 2013-11-11 DIAGNOSIS — M810 Age-related osteoporosis without current pathological fracture: Secondary | ICD-10-CM

## 2013-11-11 DIAGNOSIS — D62 Acute posthemorrhagic anemia: Secondary | ICD-10-CM | POA: Diagnosis present

## 2013-11-11 DIAGNOSIS — E43 Unspecified severe protein-calorie malnutrition: Secondary | ICD-10-CM | POA: Insufficient documentation

## 2013-11-11 DIAGNOSIS — K5730 Diverticulosis of large intestine without perforation or abscess without bleeding: Secondary | ICD-10-CM

## 2013-11-11 DIAGNOSIS — N189 Chronic kidney disease, unspecified: Secondary | ICD-10-CM

## 2013-11-11 DIAGNOSIS — K2961 Other gastritis with bleeding: Principal | ICD-10-CM | POA: Diagnosis present

## 2013-11-11 DIAGNOSIS — E785 Hyperlipidemia, unspecified: Secondary | ICD-10-CM | POA: Diagnosis present

## 2013-11-11 DIAGNOSIS — E876 Hypokalemia: Secondary | ICD-10-CM | POA: Diagnosis present

## 2013-11-11 DIAGNOSIS — Y92009 Unspecified place in unspecified non-institutional (private) residence as the place of occurrence of the external cause: Secondary | ICD-10-CM

## 2013-11-11 DIAGNOSIS — Z79899 Other long term (current) drug therapy: Secondary | ICD-10-CM

## 2013-11-11 DIAGNOSIS — E039 Hypothyroidism, unspecified: Secondary | ICD-10-CM | POA: Diagnosis present

## 2013-11-11 DIAGNOSIS — Z7982 Long term (current) use of aspirin: Secondary | ICD-10-CM

## 2013-11-11 DIAGNOSIS — Z66 Do not resuscitate: Secondary | ICD-10-CM | POA: Diagnosis present

## 2013-11-11 DIAGNOSIS — I160 Hypertensive urgency: Secondary | ICD-10-CM

## 2013-11-11 DIAGNOSIS — Z8673 Personal history of transient ischemic attack (TIA), and cerebral infarction without residual deficits: Secondary | ICD-10-CM

## 2013-11-11 DIAGNOSIS — N179 Acute kidney failure, unspecified: Secondary | ICD-10-CM

## 2013-11-11 DIAGNOSIS — R7309 Other abnormal glucose: Secondary | ICD-10-CM

## 2013-11-11 DIAGNOSIS — Z823 Family history of stroke: Secondary | ICD-10-CM

## 2013-11-11 DIAGNOSIS — G459 Transient cerebral ischemic attack, unspecified: Secondary | ICD-10-CM | POA: Diagnosis present

## 2013-11-11 DIAGNOSIS — I6529 Occlusion and stenosis of unspecified carotid artery: Secondary | ICD-10-CM

## 2013-11-11 DIAGNOSIS — N184 Chronic kidney disease, stage 4 (severe): Secondary | ICD-10-CM | POA: Diagnosis present

## 2013-11-11 LAB — I-STAT CHEM 8, ED
BUN: 47 mg/dL — ABNORMAL HIGH (ref 6–23)
CALCIUM ION: 1.25 mmol/L (ref 1.13–1.30)
Chloride: 93 mEq/L — ABNORMAL LOW (ref 96–112)
Creatinine, Ser: 2 mg/dL — ABNORMAL HIGH (ref 0.50–1.10)
Glucose, Bld: 183 mg/dL — ABNORMAL HIGH (ref 70–99)
HEMATOCRIT: 38 % (ref 36.0–46.0)
Hemoglobin: 12.9 g/dL (ref 12.0–15.0)
Potassium: 2.9 mEq/L — CL (ref 3.7–5.3)
Sodium: 140 mEq/L (ref 137–147)
TCO2: 30 mmol/L (ref 0–100)

## 2013-11-11 LAB — CBC
HEMATOCRIT: 33.2 % — AB (ref 36.0–46.0)
Hemoglobin: 11.1 g/dL — ABNORMAL LOW (ref 12.0–15.0)
MCH: 29.8 pg (ref 26.0–34.0)
MCHC: 33.4 g/dL (ref 30.0–36.0)
MCV: 89.2 fL (ref 78.0–100.0)
PLATELETS: 268 10*3/uL (ref 150–400)
RBC: 3.72 MIL/uL — ABNORMAL LOW (ref 3.87–5.11)
RDW: 12.9 % (ref 11.5–15.5)
WBC: 7.6 10*3/uL (ref 4.0–10.5)

## 2013-11-11 LAB — COMPREHENSIVE METABOLIC PANEL
ALK PHOS: 85 U/L (ref 39–117)
ALT: 25 U/L (ref 0–35)
AST: 28 U/L (ref 0–37)
Albumin: 4.1 g/dL (ref 3.5–5.2)
BUN: 52 mg/dL — ABNORMAL HIGH (ref 6–23)
CHLORIDE: 95 meq/L — AB (ref 96–112)
CO2: 32 mEq/L (ref 19–32)
Calcium: 10.1 mg/dL (ref 8.4–10.5)
Creatinine, Ser: 1.84 mg/dL — ABNORMAL HIGH (ref 0.50–1.10)
GFR calc Af Amer: 27 mL/min — ABNORMAL LOW (ref >90)
GFR calc non Af Amer: 24 mL/min — ABNORMAL LOW (ref >90)
Glucose, Bld: 203 mg/dL — ABNORMAL HIGH (ref 70–99)
Potassium: 3.2 mEq/L — ABNORMAL LOW (ref 3.7–5.3)
SODIUM: 142 meq/L (ref 137–147)
TOTAL PROTEIN: 6.8 g/dL (ref 6.0–8.3)
Total Bilirubin: 0.6 mg/dL (ref 0.3–1.2)

## 2013-11-11 LAB — APTT: aPTT: 28 seconds (ref 24–37)

## 2013-11-11 LAB — HEMOGLOBIN AND HEMATOCRIT, BLOOD
HCT: 32.8 % — ABNORMAL LOW (ref 36.0–46.0)
Hemoglobin: 11 g/dL — ABNORMAL LOW (ref 12.0–15.0)

## 2013-11-11 LAB — URINE MICROSCOPIC-ADD ON

## 2013-11-11 LAB — URINALYSIS, ROUTINE W REFLEX MICROSCOPIC
Bilirubin Urine: NEGATIVE
Glucose, UA: NEGATIVE mg/dL
KETONES UR: NEGATIVE mg/dL
NITRITE: NEGATIVE
PROTEIN: NEGATIVE mg/dL
Specific Gravity, Urine: 1.012 (ref 1.005–1.030)
Urobilinogen, UA: 1 mg/dL (ref 0.0–1.0)
pH: 6 (ref 5.0–8.0)

## 2013-11-11 LAB — PROTIME-INR
INR: 1.06 (ref 0.00–1.49)
PROTHROMBIN TIME: 13.6 s (ref 11.6–15.2)

## 2013-11-11 LAB — I-STAT CG4 LACTIC ACID, ED: Lactic Acid, Venous: 1.36 mmol/L (ref 0.5–2.2)

## 2013-11-11 LAB — TYPE AND SCREEN
ABO/RH(D): O POS
Antibody Screen: NEGATIVE

## 2013-11-11 LAB — MRSA PCR SCREENING: MRSA BY PCR: NEGATIVE

## 2013-11-11 LAB — TROPONIN I: Troponin I: <0.3 ng/mL (ref <0.30)

## 2013-11-11 LAB — POC OCCULT BLOOD, ED: Fecal Occult Bld: POSITIVE — AB

## 2013-11-11 MED ORDER — LEVOTHYROXINE SODIUM 125 MCG PO TABS: 125.0000 ug | ORAL_TABLET | Freq: Every day | ORAL | Status: DC

## 2013-11-11 MED ORDER — CLONIDINE HCL 0.1 MG PO TABS
0.1000 mg | ORAL_TABLET | Freq: Once | ORAL | Status: AC
  Administered 2013-11-11: 0.1 mg via ORAL
  Filled 2013-11-11: qty 1

## 2013-11-11 MED ORDER — DIPHENHYDRAMINE HCL 50 MG/ML IJ SOLN
25.0000 mg | Freq: Once | INTRAMUSCULAR | Status: AC
  Administered 2013-11-12: 25 mg via INTRAVENOUS
  Filled 2013-11-11: qty 1

## 2013-11-11 MED ORDER — HYDRALAZINE HCL 20 MG/ML IJ SOLN
10.0000 mg | Freq: Once | INTRAMUSCULAR | Status: AC
  Administered 2013-11-12: 10 mg via INTRAVENOUS
  Filled 2013-11-11: qty 1

## 2013-11-11 MED ORDER — ATORVASTATIN CALCIUM 20 MG PO TABS
20.0000 mg | ORAL_TABLET | Freq: Every evening | ORAL | Status: DC
  Administered 2013-11-11 – 2013-11-14 (×4): 20 mg via ORAL
  Filled 2013-11-11: qty 1
  Filled 2013-11-11 (×2): qty 2
  Filled 2013-11-11 (×2): qty 1

## 2013-11-11 MED ORDER — SODIUM CHLORIDE 0.9 % IV SOLN: 250.0000 mL | INTRAVENOUS | Status: DC | PRN

## 2013-11-11 MED ORDER — SODIUM CHLORIDE 0.9 % IJ SOLN
3.0000 mL | Freq: Two times a day (BID) | INTRAMUSCULAR | Status: DC
  Administered 2013-11-12 – 2013-11-15 (×6): 3 mL via INTRAVENOUS

## 2013-11-11 MED ORDER — POTASSIUM CHLORIDE CRYS ER 20 MEQ PO TBCR
40.0000 meq | EXTENDED_RELEASE_TABLET | Freq: Once | ORAL | Status: AC
  Administered 2013-11-11: 40 meq via ORAL
  Filled 2013-11-11: qty 2

## 2013-11-11 MED ORDER — BISOPROLOL-HYDROCHLOROTHIAZIDE 5-6.25 MG PO TABS
1.0000 | ORAL_TABLET | Freq: Every morning | ORAL | Status: DC
  Filled 2013-11-11: qty 1

## 2013-11-11 MED ORDER — METOCLOPRAMIDE HCL 5 MG/ML IJ SOLN
10.0000 mg | Freq: Once | INTRAMUSCULAR | Status: AC
  Administered 2013-11-12: 10 mg via INTRAVENOUS
  Filled 2013-11-11: qty 2

## 2013-11-11 MED ORDER — HYDRALAZINE HCL 20 MG/ML IJ SOLN
10.0000 mg | Freq: Once | INTRAMUSCULAR | Status: AC
  Administered 2013-11-11: 10 mg via INTRAVENOUS
  Filled 2013-11-11: qty 0.5

## 2013-11-11 MED ORDER — HYDRALAZINE HCL 25 MG PO TABS
25.0000 mg | ORAL_TABLET | Freq: Four times a day (QID) | ORAL | Status: DC | PRN
  Administered 2013-11-11: 25 mg via ORAL
  Filled 2013-11-11 (×2): qty 1

## 2013-11-11 MED ORDER — SODIUM CHLORIDE 0.9 % IV BOLUS (SEPSIS)
1000.0000 mL | Freq: Once | INTRAVENOUS | Status: AC
Start: 2013-11-11 — End: 2013-11-11
  Administered 2013-11-11: 1000 mL via INTRAVENOUS

## 2013-11-11 MED ORDER — CLONIDINE HCL 0.1 MG PO TABS
0.1000 mg | ORAL_TABLET | Freq: Three times a day (TID) | ORAL | Status: DC
  Administered 2013-11-11 – 2013-11-12 (×2): 0.1 mg via ORAL
  Filled 2013-11-11 (×2): qty 1

## 2013-11-11 MED ORDER — SODIUM CHLORIDE 0.9 % IJ SOLN: 3.0000 mL | INTRAMUSCULAR | Status: DC | PRN

## 2013-11-11 MED ORDER — LEVOTHYROXINE SODIUM 125 MCG PO TABS
187.5000 ug | ORAL_TABLET | ORAL | Status: DC
  Administered 2013-11-12 – 2013-11-14 (×2): 187.5 ug via ORAL
  Filled 2013-11-11: qty 3
  Filled 2013-11-11 (×2): qty 1.5

## 2013-11-11 MED ORDER — LEVOTHYROXINE SODIUM 125 MCG PO TABS
125.0000 ug | ORAL_TABLET | ORAL | Status: DC
  Administered 2013-11-13 – 2013-11-15 (×2): 125 ug via ORAL
  Filled 2013-11-11 (×4): qty 1

## 2013-11-11 MED ORDER — AMLODIPINE BESYLATE 5 MG PO TABS
5.0000 mg | ORAL_TABLET | Freq: Every day | ORAL | Status: DC
  Administered 2013-11-11: 5 mg via ORAL
  Filled 2013-11-11: qty 1

## 2013-11-11 MED ORDER — SODIUM CHLORIDE 0.9 % IV SOLN
80.0000 mg | Freq: Once | INTRAVENOUS | Status: AC
  Administered 2013-11-11: 80 mg via INTRAVENOUS
  Filled 2013-11-11: qty 80

## 2013-11-11 MED ORDER — PANTOPRAZOLE SODIUM 40 MG IV SOLR: 40.0000 mg | Freq: Two times a day (BID) | INTRAVENOUS | Status: DC

## 2013-11-11 MED ORDER — PANTOPRAZOLE SODIUM 40 MG IV SOLR
8.0000 mg/h | INTRAVENOUS | Status: DC
  Administered 2013-11-11 – 2013-11-12 (×2): 8 mg/h via INTRAVENOUS
  Filled 2013-11-11 (×4): qty 80

## 2013-11-11 NOTE — ED Notes (Addendum)
Pt presents to ed with c/o hypertension. Pt sts she is always hypertensive, however since Friday her systolic blood pressure has been in 200's range. Pt sts she was seen by her PCP on Thursday for rectal bleeding

## 2013-11-11 NOTE — Progress Notes (Signed)
Patient complaining new onset of headache. Text-paged MD Darrick Meigs and was advised to give IV 1mg  Morphine x1. Asked MD if order would be placed but MD stated was not able to at moment. Patient stated headache was relieved and did not want morphine. Order for 1x morphine was never placed. MD also aware of blood pressure and potassium level of 2.9. Order for 40 mg potassium was placed.

## 2013-11-11 NOTE — ED Notes (Signed)
Collected a clean urine sample from pt, urine appeared to be contaminated

## 2013-11-11 NOTE — ED Notes (Signed)
MD at bedside. 

## 2013-11-11 NOTE — ED Notes (Signed)
Patient transported to CT 

## 2013-11-11 NOTE — ED Provider Notes (Addendum)
TIME SEEN: 2:22 PM  CHIEF COMPLAINT: Hypertension, melena  HPI: Patient is an 80 year old female with history of TIA, chronic kidney disease, hypertension, hyperlipidemia, hypothyroidism who presents the emergency department with 2 complaints. Patient reports over the past several weeks and her blood pressure has been progressively rising. She states today was 233/89 and she checked it at home. She denies that she's had any headaches, vision changes, chest or shortness of breath, numbness or focal weakness, abdominal pain. She states that she is taking multiple blood pressure medications and has not missed any doses but has not yet had her afternoon clonidine. She was seen by her primary care physician on Thursday, 3 days ago and states he did not change her blood pressure medication. She states that for the past several days she's also had melena and then some bright red blood per rectum. Her last colonoscopy was by Dr. Henrene Pastor in 2009. No history of endoscopy. No history of alcohol use, heavy NSAID use. No history of prior GI bleed. She is not on anticoagulation.  ROS: See HPI Constitutional: no fever  Eyes: no drainage  ENT: no runny nose   Cardiovascular:  no chest pain  Resp: no SOB  GI: no vomiting GU: no dysuria Integumentary: no rash  Allergy: no hives  Musculoskeletal: no leg swelling  Neurological: no slurred speech ROS otherwise negative  PAST MEDICAL HISTORY/PAST SURGICAL HISTORY:  Past Medical History  Diagnosis Date  . Vitamin D deficiency   . Diverticulosis   . Atherosclerotic heart disease   . TIA (transient ischemic attack)   . CKD (chronic kidney disease)   . Inguinal hernia   . Broken hip     bilateral  . Complication of anesthesia     passed out after cataract surgery  . Hypertension   . Hyperlipidemia   . Hypothyroidism   . Senile osteoporosis   . Osteopenia   . Vitamin D deficiency   . Elevated hemoglobin A1c     MEDICATIONS:  Prior to Admission  medications   Medication Sig Start Date End Date Taking? Authorizing Provider  allopurinol (ZYLOPRIM) 100 MG tablet Take 1 tablet (100 mg total) by mouth daily. Takes for gout 10/12/13   Unk Pinto, MD  aspirin EC 325 MG tablet Take 325 mg by mouth at bedtime.     Historical Provider, MD  atorvastatin (LIPITOR) 20 MG tablet TAKE 1 TABLET (20 MG TOTAL) BY MOUTH DAILY. 10/23/13   Larey Dresser, MD  Biotin 1000 MCG tablet Take 1,000 mcg by mouth daily.    Historical Provider, MD  bisoprolol-hydrochlorothiazide [facility]) 5-6.25 MG per tablet TAKE 1 TABLET BY MOUTH EVERY MORNING 09/05/13   Unk Pinto, MD  calcium carbonate (CALCIUM ANTACID) 500 MG chewable tablet Chew 1 tablet by mouth 2 (two) times daily.    Historical Provider, MD  cholecalciferol (VITAMIN D) 1000 UNITS tablet Take 1,000 Units by mouth daily.    Historical Provider, MD  cloNIDine (CATAPRES) 0.1 MG tablet Take 1 tablet (0.1 mg total) by mouth 3 (three) times daily. 06/15/13   Larey Dresser, MD  furosemide (LASIX) 40 MG tablet Take 40 mg by mouth. 1/2  To 1 pill three times per week.  Pt states "fluctuates" Rx dose    Historical Provider, MD  levothyroxine (SYNTHROID, LEVOTHROID) 125 MCG tablet Take 125 mcg by mouth daily before breakfast.    Historical Provider, MD  Multiple Vitamin (MULTIVITAMIN WITH MINERALS) TABS Take 1 tablet by mouth daily.    Historical Provider,  MD  predniSONE (DELTASONE) 5 MG tablet TAKE 1-3 TABLETS (5-15 MG TOTAL) BY MOUTH DAILY. FOR APPETITE ENHANCEMENT 08/06/13   Ardis Hughs, PA-C  zinc gluconate 50 MG tablet Take 50 mg by mouth daily.    Historical Provider, MD    ALLERGIES:  Allergies  Allergen Reactions  . Aricept [Donepezil Hcl]     Nausea and vomiting  . Prevacid [Lansoprazole]     GI upset  . Tylenol [Acetaminophen]     Tylenol #3 - Nausea    SOCIAL HISTORY:  History  Substance Use Topics  . Smoking status: Former Smoker    Types: Cigarettes    Quit date: 05/06/1971  .  Smokeless tobacco: Never Used  . Alcohol Use: No     Comment: none since Nov 2013-was on 2 beers/night    FAMILY HISTORY: Family History  Problem Relation Age of Onset  . CVA Mother     died age 36  . Emphysema Sister   . Colon cancer Neg Hx     EXAM: BP 229/63  Pulse 70  Temp(Src) 98 F (36.7 C)  Resp 18  SpO2 100% CONSTITUTIONAL: Alert and oriented and responds appropriately to questions. Well-appearing; well-nourished HEAD: Normocephalic EYES: Conjunctivae clear, PERRL ENT: normal nose; no rhinorrhea; moist mucous membranes; pharynx without lesions noted NECK: Supple, no meningismus, no LAD  CARD: RRR; S1 and S2 appreciated; no murmurs, no clicks, no rubs, no gallops RESP: Normal chest excursion without splinting or tachypnea; breath sounds clear and equal bilaterally; no wheezes, no rhonchi, no rales,  ABD/GI: Normal bowel sounds; non-distended; soft, non-tender, no rebound, no guarding RECTAL:  Normal rectal tone, patient has melena that is strongly guaiac positive, no gross blood BACK:  The back appears normal and is non-tender to palpation, there is no CVA tenderness EXT: Normal ROM in all joints; non-tender to palpation; no edema; normal capillary refill; no cyanosis    SKIN: Normal color for age and race; warm NEURO: Moves all extremities equally, sensation to light touch intact diffusely, cranial nerves II through XII intact PSYCH: The patient's mood and manner are appropriate. Grooming and personal hygiene are appropriate.  MEDICAL DECISION MAKING: Patient here with 2 complaints. She is having asymptomatic hypertension and her blood pressure is in the 190s/60s without intervention. We'll give her her home dose of clonidine that was due at noon today. She's also complaining of melanotic been going on for the past 3 days and is strongly guaiac positive on exam. She is otherwise hemodynamically stable. We'll obtain labs and discuss with GI. Patient will need  admission  ED PROGRESS: Patient is a hemodynamically stable. Her hemoglobin is 11.1. She is hypokalemic with potassium of 2.9 and elevated creatinine of 2.0. Will give IV fluids and replace her potassium. Discussed with Dr. Collene Mares with gastroenterology who will see the patient in the morning. We'll discuss with medicine for admission for GI bleed and asymptomatic hypertension and hypokalemia.  3:13 PM  Pt's blood pressure is 229/72 on recheck. She has received her clonidine. Will give dose of IV hydralazine. She is still asymptomatic. Discussed with hospitalist for admission to step down given her elevated blood pressure.    EKG Interpretation  Date/Time:  Sunday November 11 2013 13:56:33 EDT Ventricular Rate:  70 PR Interval:  156 QRS Duration: 92 QT Interval:  446 QTC Calculation: 481 R Axis:   45 Text Interpretation:  Sinus rhythm Probable left atrial enlargement No significant change since last tracing Confirmed by Clista Rainford,  DO, Dorena Dorfman (  [id number]) on 11/11/2013 3:04:19 PM           [place], DO 11/11/13 [place], DO 11/11/13 [place], DO 11/11/13 1525

## 2013-11-11 NOTE — H&P (Addendum)
PCP:   Alesia Richards, MD   Chief Complaint:  *Dark stools and high blood pressure  HPI: 80 year old female who   has a past medical history of Vitamin D deficiency; Diverticulosis; Atherosclerotic heart disease; TIA (transient ischemic attack); CKD (chronic kidney disease); Inguinal hernia; Broken hip; Complication of anesthesia; Hypertension; Hyperlipidemia; Hypothyroidism; Senile osteoporosis; Osteopenia; Vitamin D deficiency; and Elevated hemoglobin A1c. Today presents to the ED with chief complaint of progressively rising blood pressure over the past few days. At home today blood pressure was  233/89, she denies having any headache, no chest pain no shortness of breath no blurred vision. She denies passing out today. Patient has been taking her medications as prescribed she denies missing any doses. As per patient she saw her primary care physician on Wednesday last week, as she was also having black colored stools. She denies frank blood in the stool denies diarrhea. She was told by the primary care physician to go to the ED in she started feeling weak. Patient's last colonoscopy was in 2009 by Dr. Henrene Pastor. She did not have endoscopy. Patient has been taking aspirin 325 mg by mouth daily for past 2 years. Patient also complains of intermittent episodes of syncope, the last episode was 2 months ago, at home though she did not come to the ED. She had a previous episode at a funeral 6 months ago patient passed out for a few seconds. She denies history of seizures. Patient also has been complaining of double vision intermittently, the last episode was more than 2 weeks ago at home, which occurred after patient put on her new prescription glasses. As per patient the prescription glasses has been changed this year, and she got double vision after she wore those glasses. Patient does have a history of TIA in the past. Today she denies slurred speech, no weakness of extremities, no blurred vision   no double vision no syncope She does take Lasix for lower extremity edema. Allergies:   Allergies  Allergen Reactions  . Aricept [Donepezil Hcl]     Nausea and vomiting  . Prevacid [Lansoprazole]     GI upset  . Tylenol [Acetaminophen]     Tylenol #3 - Nausea      Past Medical History  Diagnosis Date  . Vitamin D deficiency   . Diverticulosis   . Atherosclerotic heart disease   . TIA (transient ischemic attack)   . CKD (chronic kidney disease)   . Inguinal hernia   . Broken hip     bilateral  . Complication of anesthesia     passed out after cataract surgery  . Hypertension   . Hyperlipidemia   . Hypothyroidism   . Senile osteoporosis   . Osteopenia   . Vitamin D deficiency   . Elevated hemoglobin A1c     Past Surgical History  Procedure Laterality Date  . Inguinal hernia repair    . Tonsillectomy    . Hip arthroplasty  04/01/2012    Procedure: ARTHROPLASTY BIPOLAR HIP;  Surgeon: Kerin Salen, MD;  Location: [facility];  Service: Orthopedics;  Laterality: Left;  . Compression hip screw  05/06/2012    Procedure: COMPRESSION HIP;  Surgeon: Jolyn Nap, MD;  Location: [facility];  Service: Orthopedics;  Laterality: Right;  . Skin lesion excision      benignt; from shoulder  . Hip surgery      left  . Eye surgery      bilateral  . Eye surgery  tear duct R side  . Blepharoplasty  1983  . Melanoma excision  1991    Right Arm  . Total hip arthroplasty Right 2013  . Total hip arthroplasty Left 2013    Prior to Admission medications   Medication Sig Start Date End Date Taking? Authorizing Provider  allopurinol (ZYLOPRIM) 100 MG tablet Take 100 mg by mouth every morning.   Yes Historical Provider, MD  aspirin EC 325 MG tablet Take 325 mg by mouth at bedtime.    Yes Historical Provider, MD  atorvastatin (LIPITOR) 20 MG tablet Take 20 mg by mouth every evening.   Yes Historical Provider, MD  Biotin 1000 MCG tablet Take 1,000 mcg by mouth every morning.    Yes  Historical Provider, MD  bisoprolol-hydrochlorothiazide (ZIAC) 5-6.25 MG per tablet Take 1 tablet by mouth every morning.   Yes Historical Provider, MD  calcium carbonate (CALCIUM ANTACID) 500 MG chewable tablet Chew 1 tablet by mouth 2 (two) times daily.   Yes Historical Provider, MD  cholecalciferol (VITAMIN D) 1000 UNITS tablet Take 1,000 Units by mouth every morning.    Yes Historical Provider, MD  cloNIDine (CATAPRES) 0.1 MG tablet Take 0.1 mg by mouth 3 (three) times daily.   Yes Historical Provider, MD  furosemide (LASIX) 40 MG tablet Take 20-40 mg by mouth every morning. Alternate 1 tab and 0.5 tab daily.   Yes Historical Provider, MD  levothyroxine (SYNTHROID, LEVOTHROID) 125 MCG tablet Take 125-187.5 mcg by mouth daily before breakfast. Take 1.5 tabs (187.16mcg) on Monday, Wednesday, and Friday. Take 1 tab (153mcg) on Sunday, Tuesday, Thursday, and Saturday.   Yes Historical Provider, MD  Multiple Vitamin (MULTIVITAMIN WITH MINERALS) TABS Take 1 tablet by mouth every morning.    Yes Historical Provider, MD    Social History:  reports that she quit smoking about 42 years ago. Her smoking use included Cigarettes. She smoked 0.00 packs per day. She has never used smokeless tobacco. She reports that she does not drink alcohol or use illicit drugs.  Family History  Problem Relation Age of Onset  . CVA Mother     died age 75  . Emphysema Sister   . Colon cancer Neg Hx      All the positives are listed in BOLD  Review of Systems:  HEENT: Headache, blurred vision, runny nose, sore throat Neck: Hypothyroidism, hyperthyroidism,,lymphadenopathy Chest : Shortness of breath, history of COPD, Asthma Heart : Chest pain, history of coronary arterey disease GI:  Nausea, vomiting, diarrhea, constipation, GERD, melena GU: Dysuria, urgency, frequency of urination, hematuria Neuro: Stroke, seizures, syncope, TIA Psych: Depression, anxiety, hallucinations   Physical Exam: Blood pressure  211/56, pulse 65, temperature 98 F (36.7 C), resp. rate 13, SpO2 100.00%. Constitutional:   Patient is a well-developed and well-nourished female in no acute distress and cooperative with exam. Head: Normocephalic and atraumatic Mouth: Mucus membranes moist Eyes: PERRL, EOMI, conjunctivae normal Neck: Supple, No Thyromegaly Cardiovascular: RRR, grade 3/ 6 systolic murmur auscultated in the aortic area Pulmonary/Chest: CTAB, no wheezes, rales, or rhonchi Abdominal: Soft. Non-tender, non-distended, bowel sounds are normal, no masses, organomegaly, or guarding present.  Neurological: A&O x3, Strenght is normal and symmetric bilaterally, cranial nerve II-XII are grossly intact, no focal motor deficit, sensory intact to light touch bilaterally.  Extremities : Bilateral 1+ pitting edema  Labs on Admission:  Basic Metabolic Panel:  Recent Labs Lab 11/08/13 1050 11/11/13 1400 11/11/13 1430  NA 142 142 140  K 3.6 3.2* 2.9*  CL 101  95* 93*  CO2 30 32  --   GLUCOSE 159* 203* 183*  BUN 43* 52* 47*  CREATININE 1.70* 1.84* 2.00*  CALCIUM 9.6 10.1  --    Liver Function Tests:  Recent Labs Lab 11/08/13 1050 11/11/13 1400  AST 27 28  ALT 24 25  ALKPHOS 77 85  BILITOT 0.6 0.6  PROT 5.8* 6.8  ALBUMIN 4.0 4.1   No results found for this basename: LIPASE, AMYLASE,  in the last 168 hours No results found for this basename: AMMONIA,  in the last 168 hours CBC:  Recent Labs Lab 11/08/13 1050 11/11/13 1400 11/11/13 1430  WBC 5.7 7.6  --   NEUTROABS 4.3  --   --   HGB 11.1* 11.1* 12.9  HCT 33.3* 33.2* 38.0  MCV 92.1 89.2  --   PLT 181 268  --     EKG: Independently reviewed. Normal sinus rhythm   Assessment/Plan Active Problems:   TRANSIENT ISCHEMIC ATTACK   CKD (chronic kidney disease), stage III   Hypothyroidism   GI bleed   Hypertensive urgency   Hypertensive urgency Will continue patient's home medications including Catapres 0.1 3 times a day and Ziac 5/6.25, I'm  also going to add amlodipine 5 mg by mouth daily. We'll start hydralazine 25 mg by mouth every 6 hours when necessary for BP greater than 160/100. Patient's medications will need to be adjusted before discharge  Melena Likely due to aspirin use, gastric erosions versus ulcer Patient has guaiac-positive stools, ED physician has called Dr. Collene Mares, who will see the patient in the morning. We'll keep the patient n.p.o. for possible EGD in a.m. Will check H&H every 6 hours. Hemoglobin is stable at this time so don't need of blood transfusion. Continue to monitor H&H. We'll start the patient on IV Protonix infusion  CKD Patient has CKD. stage III, will follow BMP in a.m.  History of double vision As per patient , she has these episodes while she puts on new prescription glasses. No recent episode of double vision, But she has a history of TIA in the past. Will obtain CT head to rule out underlying neurologic abnormality. Consider MRI brain based on the results of CT head.  History of syncope No recent episodes of passing out, last episode was more than 6 weeks ago. We'll obtain 2-D echocardiogram and check cardiac enzymes time 3. Patient might also need outpatient Holter monitoring or event monitor to ascertain the cause of syncope. We'll also obtain EEG to rule out seizure.  Hypothyroidism Continue Synthroid  Hypokalemia Replace potassium and check BMP in a.m.  Lower extremity edema ? Cause, check echocardiogram in a.m. Will hold Lasix at this time due to worsening renal functions.  DVT prophylaxis SCDs   Code status: Patient is DO NOT RESUSCITATE  Family discussion: Admission, patients condition and plan of care including tests being ordered have been discussed with the patient and *her daughter at bedside* who indicate understanding and agree with the plan and Code Status.   Time Spent on Admission: 65 minutes  [facility] Pager: [phone] 11/11/2013, 4:02  PM  If 7PM-7AM, please contact night-coverage  [email or web address]  Password [email or web address]

## 2013-11-12 ENCOUNTER — Encounter (HOSPITAL_COMMUNITY): Payer: Self-pay

## 2013-11-12 ENCOUNTER — Inpatient Hospital Stay (HOSPITAL_COMMUNITY)
Admit: 2013-11-12 | Discharge: 2013-11-12 | Disposition: A | Discharge status: Home / Self Care | Payer: Medicare Other | Attending: Family Medicine | Admitting: Family Medicine

## 2013-11-12 ENCOUNTER — Encounter (HOSPITAL_COMMUNITY)
Admission: EM | Disposition: A | Discharge status: Home Health | Payer: Self-pay | Source: Home / Self Care | Attending: Family Medicine

## 2013-11-12 ENCOUNTER — Ambulatory Visit: Payer: Self-pay | Admitting: Emergency Medicine

## 2013-11-12 DIAGNOSIS — E43 Unspecified severe protein-calorie malnutrition: Secondary | ICD-10-CM | POA: Insufficient documentation

## 2013-11-12 DIAGNOSIS — N189 Chronic kidney disease, unspecified: Secondary | ICD-10-CM

## 2013-11-12 DIAGNOSIS — N179 Acute kidney failure, unspecified: Secondary | ICD-10-CM

## 2013-11-12 DIAGNOSIS — R404 Transient alteration of awareness: Secondary | ICD-10-CM

## 2013-11-12 DIAGNOSIS — E039 Hypothyroidism, unspecified: Secondary | ICD-10-CM

## 2013-11-12 DIAGNOSIS — I359 Nonrheumatic aortic valve disorder, unspecified: Secondary | ICD-10-CM

## 2013-11-12 DIAGNOSIS — K2960 Other gastritis without bleeding: Secondary | ICD-10-CM

## 2013-11-12 HISTORY — PX: ESOPHAGOGASTRODUODENOSCOPY: SHX5428

## 2013-11-12 LAB — TROPONIN I
Troponin I: <0.3 ng/mL (ref <0.30)
Troponin I: <0.3 ng/mL (ref <0.30)

## 2013-11-12 LAB — COMPREHENSIVE METABOLIC PANEL
ALT: 34 U/L (ref 0–35)
AST: 40 U/L — AB (ref 0–37)
Albumin: 3.6 g/dL (ref 3.5–5.2)
Alkaline Phosphatase: 82 U/L (ref 39–117)
BUN: 51 mg/dL — ABNORMAL HIGH (ref 6–23)
CALCIUM: 9.2 mg/dL (ref 8.4–10.5)
CO2: 26 mEq/L (ref 19–32)
CREATININE: 2.09 mg/dL — AB (ref 0.50–1.10)
Chloride: 101 mEq/L (ref 96–112)
GFR, EST AFRICAN AMERICAN: 23 mL/min — AB (ref >90)
GFR, EST NON AFRICAN AMERICAN: 20 mL/min — AB (ref >90)
GLUCOSE: 136 mg/dL — AB (ref 70–99)
Potassium: 3.8 mEq/L (ref 3.7–5.3)
Sodium: 142 mEq/L (ref 137–147)
Total Bilirubin: 0.6 mg/dL (ref 0.3–1.2)
Total Protein: 6 g/dL (ref 6.0–8.3)

## 2013-11-12 LAB — HEMOGLOBIN AND HEMATOCRIT, BLOOD
HCT: 30.7 % — ABNORMAL LOW (ref 36.0–46.0)
HCT: 31.3 % — ABNORMAL LOW (ref 36.0–46.0)
HEMOGLOBIN: 10.5 g/dL — AB (ref 12.0–15.0)
Hemoglobin: 10.4 g/dL — ABNORMAL LOW (ref 12.0–15.0)

## 2013-11-12 LAB — CBC
HCT: 31.8 % — ABNORMAL LOW (ref 36.0–46.0)
Hemoglobin: 10.8 g/dL — ABNORMAL LOW (ref 12.0–15.0)
MCH: 30.4 pg (ref 26.0–34.0)
MCHC: 34 g/dL (ref 30.0–36.0)
MCV: 89.6 fL (ref 78.0–100.0)
PLATELETS: 203 10*3/uL (ref 150–400)
RBC: 3.55 MIL/uL — ABNORMAL LOW (ref 3.87–5.11)
RDW: 12.9 % (ref 11.5–15.5)
WBC: 9.4 10*3/uL (ref 4.0–10.5)

## 2013-11-12 SURGERY — EGD (ESOPHAGOGASTRODUODENOSCOPY)
Anesthesia: Moderate Sedation

## 2013-11-12 MED ORDER — LABETALOL HCL 5 MG/ML IV SOLN
5.0000 mg | Freq: Once | INTRAVENOUS | Status: AC
  Administered 2013-11-12: 5 mg via INTRAVENOUS
  Filled 2013-11-12: qty 4

## 2013-11-12 MED ORDER — FENTANYL CITRATE 0.05 MG/ML IJ SOLN
INTRAMUSCULAR | Status: AC
  Filled 2013-11-12: qty 2

## 2013-11-12 MED ORDER — MIDAZOLAM HCL 10 MG/2ML IJ SOLN
INTRAMUSCULAR | Status: AC
  Filled 2013-11-12: qty 2

## 2013-11-12 MED ORDER — CLONIDINE HCL 0.1 MG PO TABS
0.1000 mg | ORAL_TABLET | Freq: Once | ORAL | Status: AC
  Administered 2013-11-12: 0.1 mg via ORAL
  Filled 2013-11-12: qty 1

## 2013-11-12 MED ORDER — BUTAMBEN-TETRACAINE-BENZOCAINE 2-2-14 % EX AERO
INHALATION_SPRAY | CUTANEOUS | Status: DC | PRN
  Administered 2013-11-12: 2 via TOPICAL

## 2013-11-12 MED ORDER — LABETALOL HCL 5 MG/ML IV SOLN
10.0000 mg | Freq: Once | INTRAVENOUS | Status: AC
  Administered 2013-11-12: 10 mg via INTRAVENOUS

## 2013-11-12 MED ORDER — MIDAZOLAM HCL 10 MG/2ML IJ SOLN
INTRAMUSCULAR | Status: DC | PRN
  Administered 2013-11-12: 1 mg via INTRAVENOUS

## 2013-11-12 MED ORDER — AMLODIPINE BESYLATE 10 MG PO TABS
10.0000 mg | ORAL_TABLET | Freq: Every day | ORAL | Status: DC
Start: 2013-11-12 — End: 2013-11-15
  Administered 2013-11-12 – 2013-11-15 (×4): 10 mg via ORAL
  Filled 2013-11-12 (×5): qty 1

## 2013-11-12 MED ORDER — LABETALOL HCL 5 MG/ML IV SOLN
10.0000 mg | Freq: Once | INTRAVENOUS | Status: AC
  Administered 2013-11-12: 10 mg via INTRAVENOUS
  Filled 2013-11-12: qty 4

## 2013-11-12 MED ORDER — LORAZEPAM 2 MG/ML IJ SOLN
1.0000 mg | Freq: Once | INTRAMUSCULAR | Status: AC
  Administered 2013-11-12: 1 mg via INTRAVENOUS

## 2013-11-12 MED ORDER — BISOPROLOL-HYDROCHLOROTHIAZIDE 10-6.25 MG PO TABS: 1.0000 | ORAL_TABLET | Freq: Every day | ORAL | Status: DC

## 2013-11-12 MED ORDER — FENTANYL CITRATE 0.05 MG/ML IJ SOLN
INTRAMUSCULAR | Status: DC | PRN
  Administered 2013-11-12: 12.5 ug via INTRAVENOUS

## 2013-11-12 MED ORDER — LABETALOL HCL 5 MG/ML IV SOLN
20.0000 mg | Freq: Once | INTRAVENOUS | Status: DC
  Filled 2013-11-12: qty 4

## 2013-11-12 MED ORDER — BISOPROLOL FUMARATE 10 MG PO TABS
10.0000 mg | ORAL_TABLET | Freq: Every day | ORAL | Status: DC
  Administered 2013-11-12 – 2013-11-15 (×4): 10 mg via ORAL
  Filled 2013-11-12 (×2): qty 1
  Filled 2013-11-12: qty 2
  Filled 2013-11-12: qty 1

## 2013-11-12 MED ORDER — PANTOPRAZOLE SODIUM 40 MG PO TBEC
40.0000 mg | DELAYED_RELEASE_TABLET | Freq: Every day | ORAL | Status: DC
  Administered 2013-11-13: 40 mg via ORAL
  Filled 2013-11-12 (×2): qty 1

## 2013-11-12 MED ORDER — LORAZEPAM 2 MG/ML IJ SOLN
INTRAMUSCULAR | Status: AC
  Administered 2013-11-12: 1 mg via INTRAVENOUS
  Filled 2013-11-12: qty 1

## 2013-11-12 MED ORDER — CLONIDINE HCL 0.2 MG PO TABS
0.2000 mg | ORAL_TABLET | Freq: Three times a day (TID) | ORAL | Status: DC
  Administered 2013-11-12 – 2013-11-15 (×9): 0.2 mg via ORAL
  Filled 2013-11-12 (×2): qty 1
  Filled 2013-11-12: qty 2
  Filled 2013-11-12 (×8): qty 1

## 2013-11-12 NOTE — Consult Note (Signed)
Consultation  Referring Raelyn Racette:  [facility]  Primary Care Physician:  Alesia Richards, MD Primary Gastroenterologist:  Scarlette Shorts, MD       Reason for Consultation: GI bleed           HPI:   Rachael [name] is a 80 y.o. female with multiple medical problems, on multiple medications.   Patient was seen by PCP last week for constipation and dark stool. She was guaiac positive. Patient was to be referred to GI but in interim was admitted with hypertensive urgency. She reported melena in ED. Rectal exam revealed melena. Patient denies abdominal pain or nausea. She reports taking a full aspirin daily, no other NSAIDS.   Past Medical History  Diagnosis Date  . Vitamin D deficiency   . Diverticulosis   . Atherosclerotic heart disease   . TIA (transient ischemic attack)   . CKD (chronic kidney disease)   . Inguinal hernia   . Broken hip     bilateral  . Complication of anesthesia     passed out after cataract surgery  . Hypertension   . Hyperlipidemia   . Hypothyroidism   . Senile osteoporosis   . Osteopenia   . Vitamin D deficiency   . Elevated hemoglobin A1c     Past Surgical History  Procedure Laterality Date  . Inguinal hernia repair    . Tonsillectomy    . Hip arthroplasty  04/01/2012    Procedure: ARTHROPLASTY BIPOLAR HIP;  Surgeon: Kerin Salen, MD;  Location: [facility];  Service: Orthopedics;  Laterality: Left;  . Compression hip screw  05/06/2012    Procedure: COMPRESSION HIP;  Surgeon: Jolyn Nap, MD;  Location: [place];  Service: Orthopedics;  Laterality: Right;  . Skin lesion excision      benignt; from shoulder  . Hip surgery      left  . Eye surgery      bilateral  . Eye surgery      tear duct R side  . Blepharoplasty  1983  . Melanoma excision  1991    Right Arm  . Total hip arthroplasty Right 2013  . Total hip arthroplasty Left 2013    Family History  Problem Relation Age of Onset  . CVA Mother     died age 14  . Emphysema  Sister   . Colon cancer Neg Hx      History  Substance Use Topics  . Smoking status: Former Smoker    Types: Cigarettes    Quit date: 05/06/1971  . Smokeless tobacco: Never Used  . Alcohol Use: No     Comment: none since Nov 2013-was on 2 beers/night    Prior to Admission medications   Medication Sig Start Date End Date Taking? Authorizing Saphronia Ozdemir  allopurinol (ZYLOPRIM) 100 MG tablet Take 100 mg by mouth every morning.   Yes Historical Zeven Kocak, MD  aspirin EC 325 MG tablet Take 325 mg by mouth at bedtime.    Yes Historical Emidio Warrell, MD  atorvastatin (LIPITOR) 20 MG tablet Take 20 mg by mouth every evening.   Yes Historical Kongmeng Santoro, MD  Biotin 1000 MCG tablet Take 1,000 mcg by mouth every morning.    Yes Historical Toriann Spadoni, MD  bisoprolol-hydrochlorothiazide (ZIAC) 5-6.25 MG per tablet Take 1 tablet by mouth every morning.   Yes Historical Mikhi Athey, MD  calcium carbonate (CALCIUM ANTACID) 500 MG chewable tablet Chew 1 tablet by mouth 2 (two) times daily.   Yes Historical Mycal Conde, MD  cholecalciferol (VITAMIN D) 1000 UNITS tablet Take 1,000 Units by mouth every morning.    Yes Historical Blondina Coderre, MD  cloNIDine (CATAPRES) 0.1 MG tablet Take 0.1 mg by mouth 3 (three) times daily.   Yes Historical Bobbijo Holst, MD  furosemide (LASIX) 40 MG tablet Take 20-40 mg by mouth every morning. Alternate 1 tab and 0.5 tab daily.   Yes Historical Blimy Napoleon, MD  levothyroxine (SYNTHROID, LEVOTHROID) 125 MCG tablet Take 125-187.5 mcg by mouth daily before breakfast. Take 1.5 tabs (187.21mcg) on Monday, Wednesday, and Friday. Take 1 tab (1100mcg) on Sunday, Tuesday, Thursday, and Saturday.   Yes Historical Erasmus Bistline, MD  Multiple Vitamin (MULTIVITAMIN WITH MINERALS) TABS Take 1 tablet by mouth every morning.    Yes Historical Sussie Minor, MD    Current Facility-Administered Medications  Medication Dose Route Frequency Letticia Bhattacharyya Last Rate Last Dose  . 0.9 %  sodium chloride infusion  250 mL Intravenous PRN  Oswald Hillock, MD      . amLODipine (NORVASC) tablet 10 mg  10 mg Oral Daily Oswald Hillock, MD      . atorvastatin (LIPITOR) tablet 20 mg  20 mg Oral QPM Oswald Hillock, MD   20 mg at 11/11/13 1823  . bisoprolol (ZEBETA) tablet 10 mg  10 mg Oral Daily Oswald Hillock, MD      . cloNIDine (CATAPRES) tablet 0.1 mg  0.1 mg Oral TID Oswald Hillock, MD   0.1 mg at 11/11/13 2156  . hydrALAZINE (APRESOLINE) tablet 25 mg  25 mg Oral Q6H PRN Oswald Hillock, MD   25 mg at 11/11/13 2156  . levothyroxine (SYNTHROID, LEVOTHROID) tablet 187.5 mcg  187.5 mcg Oral Once per day on Mon Wed Fri Gagan S Lama, MD       And  . Derrill Memo ON 11/13/2013] levothyroxine (SYNTHROID, LEVOTHROID) tablet 125 mcg  125 mcg Oral Once per day on Sun Tue Thu Sat Oswald Hillock, MD      . pantoprazole (PROTONIX) 80 mg in sodium chloride 0.9 % 250 mL infusion  8 mg/hr Intravenous Continuous Kristen N Ward, DO 25 mL/hr at 11/12/13 0600 8 mg/hr at 11/12/13 0600  . [START ON 11/15/2013] pantoprazole (PROTONIX) injection 40 mg  40 mg Intravenous Q12H Kristen N Ward, DO      . sodium chloride 0.9 % injection 3 mL  3 mL Intravenous Q12H Oswald Hillock, MD      . sodium chloride 0.9 % injection 3 mL  3 mL Intravenous PRN Oswald Hillock, MD        Allergies as of 11/11/2013 - Review Complete 11/11/2013  Allergen Reaction Noted  . Aricept [donepezil hcl]  04/12/2013  . Prevacid [lansoprazole]  04/11/2013  . Tylenol [acetaminophen]  04/11/2013    Review of Systems:    Positive for headache last night. All other systems reviewed and negative except where noted in HPI.   Physical Exam:  Vital signs in last 24 hours: Temp:  [98 F (36.7 C)-98.5 F (36.9 C)] 98.5 F (36.9 C) (06/22 0230) Pulse Rate:  [58-82] 72 (06/22 0600) Resp:  [11-25] 21 (06/22 0600) BP: (141-245)/(48-107) 202/50 mmHg (06/22 0600) SpO2:  [92 %-100 %] 97 % (06/22 0600) Weight:  [115 lb 15.4 oz (52.6 kg)] 115 lb 15.4 oz (52.6 kg) (06/22 0600) Last BM Date: 11/11/13 General:    Pleasant thin white female in NAD Head:  Normocephalic and atraumatic. Eyes:   No icterus.   Conjunctiva pink. Ears:  Normal auditory acuity. Neck:  Supple; no masses felt Lungs:  Respirations even and unlabored. Lungs clear to auscultation bilaterally.   No wheezes, crackles, or rhonchi.  Heart:  Regular rate and rhythm Abdomen:  Soft, nondistended, nontender. Normal bowel sounds. LLQ "lumpy"but may be subcutaneously.  No appreciable masses or hepatomegaly.  Rectal:  Dark but not really black stool.  Msk:  Symmetrical without gross deformities.  Extremities:  Without edema. Neurologic:  Alert and  oriented x4;  grossly normal neurologically. Skin:  Intact without significant lesions or rashes. Cervical Nodes:  No significant cervical adenopathy. Psych:  Alert and cooperative. Normal affect.  LAB RESULTS:  Recent Labs  11/11/13 1400 11/11/13 1430 11/11/13 1850 11/12/13 0104  WBC 7.6  --   --  9.4  HGB 11.1* 12.9 11.0* 10.8*  HCT 33.2* 38.0 32.8* 31.8*  PLT 268  --   --  203   BMET  Recent Labs  11/11/13 1400 11/11/13 1430 11/12/13 0104  NA 142 140 142  K 3.2* 2.9* 3.8  CL 95* 93* 101  CO2 32  --  26  GLUCOSE 203* 183* 136*  BUN 52* 47* 51*  CREATININE 1.84* 2.00* 2.09*  CALCIUM 10.1  --  9.2   LFT  Recent Labs  11/12/13 0104  PROT 6.0  ALBUMIN 3.6  AST 40*  ALT 34  ALKPHOS 82  BILITOT 0.6   PT/INR  Recent Labs  11/11/13 1400  LABPROT 13.6  INR 1.06    STUDIES: Ct Head Wo Contrast  11/11/2013   CLINICAL DATA:  Hypertension.  EXAM: CT HEAD WITHOUT CONTRAST  TECHNIQUE: Contiguous axial images were obtained from the base of the skull through the vertex without intravenous contrast.  COMPARISON:  Head CT 02/08/2013.  FINDINGS: Physiologic calcifications are noted in the basal ganglia bilaterally (left greater than right). Well-defined foci of low attenuation in the right globus pallidus are compatible with old lacunar infarctions. No acute intracranial  abnormalities. Specifically, no evidence of acute intracranial hemorrhage, no definite findings of acute/subacute cerebral ischemia, no mass, mass effect, hydrocephalus or abnormal intra or extra-axial fluid collections. Visualized paranasal sinuses and mastoids are well pneumatized. No acute displaced skull fractures are identified.  IMPRESSION: 1. No acute intracranial abnormalities. 2. 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   Electronically Signed   By: Vinnie Langton M.D.   On: 11/11/2013 17:28    PREVIOUS ENDOSCOPIES:            colonoscopy 2009, see HPI   Impression / Plan:   1. GI bleed, suspect upper source given melena. BUN elevated but not necessarily out of proportion to creatinine. Stools very dark and heme + at PCP's office and in ED. She will need EGD for further evaluation once blood pressure improves (SBP 215). Continue BID PPI for now. Monitor hgb.   2. Normocytic anemia, overall stable. Baseline hgb difficult to determine as it has fluctuated widely over last several months. She was 12.4 in April, down to 11.1 in ED. Today hgb at 10.8.   3. Multiple medical problems as listed in [place]   Thanks   LOS: 1 day   Tye Savoy  11/12/2013, 8:49 AM

## 2013-11-12 NOTE — Progress Notes (Addendum)
INITIAL NUTRITION ASSESSMENT  DOCUMENTATION CODES Per approved criteria  -Severe malnutrition in the context of chronic illness -underweight  Patient meets criteria for severe malnutrition in the context of acute illness AEB decreased muscle mass and body fat as well as 11% weight loss in the last week with expected poor intake.  INTERVENTION: -Diet advancement per MD -Add supplement (Boost or [employer]) when diet advanced -RD to monitor.  NUTRITION DIAGNOSIS: Inadequate oral intake  related to inability to eat  as evidenced by npo status.   Goal: Diet advancement with intake to meet >90% estimated needs.  Monitor:  Diet advancement, tolerance and intake, labs, weight trend  Reason for Assessment: Low BMI  80 y.o. female  Admitting Dx: <principal problem not specified>  Assessment:   Patient was seen by PCP last week for constipation and dark stool. She was guaiac positive. Patient was to be referred to GI but in interim was admitted with hypertensive urgency. She reported melena in ED. Rectal exam revealed melena. Patient denies abdominal pain or nausea. She reports taking a full aspirin daily, no other NSAIDS.   6/22: -EGD today.  Currently NPO -Boost prior to admit -"I ate OK" but then "I don't know.  Patient unable to answer questions.   -Last seen by inpatient RD 09/29/2012.   -Weight loss of 15 lbs since 6/18 (11%) -Nutrition focused physical exam deferred as patient on the way to endo.  Decreased muscle mass and body fat observed. -Patient is underweight   Height: Ht Readings from Last 1 Encounters:  11/12/13 5\' 8"  (1.727 m)    Weight: Wt Readings from Last 1 Encounters:  11/12/13 115 lb 15.4 oz (52.6 kg)    Ideal Body Weight: 140 lbs  % Ideal Body Weight: 83  Wt Readings from Last 10 Encounters:  11/12/13 115 lb 15.4 oz (52.6 kg)  11/12/13 115 lb 15.4 oz (52.6 kg)  11/08/13 131 lb (59.421 kg)  08/28/13 133 lb 3.2 oz (60.419 kg)  04/12/13 126 lb  (57.153 kg)  01/11/13 130 lb (58.968 kg)  11/17/12 130 lb 1.9 oz (59.022 kg)  10/17/12 129 lb (58.514 kg)  09/28/12 127 lb 3.2 oz (57.698 kg)  05/15/12 136 lb 4 oz (61.803 kg)    Usual Body Weight: 131 lbs  % Usual Body Weight: 89  BMI:  Body mass index is 17.64 kg/(m^2).  Estimated Nutritional Needs: Kcal: 1500-1700 Protein: 55-65 gm Fluid: >/=1.5L  Skin: intact  Diet Order: NPO  EDUCATION NEEDS: -No education needs identified at this time   Intake/Output Summary (Last 24 hours) at 11/12/13 1316 Last data filed at 11/12/13 0900  Gross per 24 hour  Intake    875 ml  Output    400 ml  Net    475 ml    Labs:   Recent Labs Lab 11/08/13 1050 11/11/13 1400 11/11/13 1430 11/12/13 0104  NA 142 142 140 142  K 3.6 3.2* 2.9* 3.8  CL 101 95* 93* 101  CO2 30 32  --  26  BUN 43* 52* 47* 51*  CREATININE 1.70* 1.84* 2.00* 2.09*  CALCIUM 9.6 10.1  --  9.2  GLUCOSE 159* 203* 183* 136*    CBG (last 3)  No results found for this basename: GLUCAP,  in the last 72 hours  Scheduled Meds: . amLODipine  10 mg Oral Daily  . atorvastatin  20 mg Oral QPM  . bisoprolol  10 mg Oral Daily  . cloNIDine  0.2 mg Oral TID  .  levothyroxine  187.5 mcg Oral Once per day on Mon Wed Fri   And  . [START ON 11/13/2013] levothyroxine  125 mcg Oral Once per day on Sun Tue Thu Sat  . [START ON 11/15/2013] pantoprazole (PROTONIX) IV  40 mg Intravenous Q12H  . sodium chloride  3 mL Intravenous Q12H    Continuous Infusions: . pantoprozole (PROTONIX) infusion 8 mg/hr (11/12/13 0600)    Past Medical History  Diagnosis Date  . Vitamin D deficiency   . Diverticulosis   . Atherosclerotic heart disease   . TIA (transient ischemic attack)   . CKD (chronic kidney disease)   . Inguinal hernia   . Broken hip     bilateral  . Complication of anesthesia     passed out after cataract surgery  . Hypertension   . Hyperlipidemia   . Hypothyroidism   . Senile osteoporosis   . Osteopenia   .  Vitamin D deficiency   . Elevated hemoglobin A1c     Past Surgical History  Procedure Laterality Date  . Inguinal hernia repair    . Tonsillectomy    . Hip arthroplasty  04/01/2012    Procedure: ARTHROPLASTY BIPOLAR HIP;  Surgeon: Kerin Salen, MD;  Location: [facility];  Service: Orthopedics;  Laterality: Left;  . Compression hip screw  05/06/2012    Procedure: COMPRESSION HIP;  Surgeon: Jolyn Nap, MD;  Location: [place];  Service: Orthopedics;  Laterality: Right;  . Skin lesion excision      benignt; from shoulder  . Hip surgery      left  . Eye surgery      bilateral  . Eye surgery      tear duct R side  . Blepharoplasty  1983  . Melanoma excision  1991    Right Arm  . Total hip arthroplasty Right 2013  . Total hip arthroplasty Left 2013    Antonieta Iba, RD, LDN Clinical Inpatient Dietitian Pager:  [phone] Weekend and after hours pager:  [phone]

## 2013-11-12 NOTE — Op Note (Signed)
[facility] [place], [id number]   ENDOSCOPY PROCEDURE REPORT  PATIENT: Rachael [name], Rachael [name]  MR#: [id number] BIRTHDATE: 1923-10-02 , 55  yrs. old GENDER: Female ENDOSCOPIST: Jerene Bears, MD REFERRED BY:  [facility] PROCEDURE DATE:  11/12/2013 PROCEDURE:  EGD w/ biopsy ASA CLASS:     Class III INDICATIONS:  Melena. MEDICATIONS: These medications were titrated to patient response per physician's verbal order, Fentanyl 12.5 mcg IV, and Versed 1mg  IV  TOPICAL ANESTHETIC: Cetacaine Spray  DESCRIPTION OF PROCEDURE: After the risks benefits and alternatives of the procedure were thoroughly explained, informed consent was obtained.  The Pentax Gastroscope [id number] endoscope was introduced through the mouth and advanced to the second portion of the duodenum. Without limitations.  The instrument was slowly withdrawn as the mucosa was fully examined.  ESOPHAGUS: A nonobstructing, widely patent, Schatzki ring was found 40 cm from the incisors.   The esophagus was otherwise normal.  STOMACH: Gastritis (inflammation) with several punctate erosions was found in the gastric antrum.  Biopsies were taken in the antrum and angularis.  Small, fundic-gland and benign appearing proximal gastric polyps.  No active bleeding.  The stomach otherwise appeared normal.  DUODENUM: The duodenal mucosa showed no abnormalities in the bulb and second portion of the duodenum.  Retroflexed views revealed no abnormalities.     The scope was then withdrawn from the patient and the procedure completed.  COMPLICATIONS: There were no complications. ENDOSCOPIC IMPRESSION: 1.   Nonobstructing Schatzki ring was found 40 cm from the incisors; otherwise normal esophagus 2.   Erosive gastritis (inflammation) was found in the gastric antrum; biopsies were taken in the antrum and angularis 3.   Benign proximal gastric polyps, the stomach otherwise appeared normal 4.   The duodenal  mucosa showed no abnormalities in the bulb and second portion of the duodenum  RECOMMENDATIONS: 1.  Daily PPI, avoid NSAIDs other than aspirin 2.  Monitor Hgb, no plans for colonoscopy at this time  eSigned:  Jerene Bears, MD 11/12/2013 1:50 PM CC:The Patient

## 2013-11-12 NOTE — Progress Notes (Signed)
  Echocardiogram 2D Echocardiogram has been performed.  [place], [place] 11/12/2013, 10:02 AM

## 2013-11-12 NOTE — Progress Notes (Signed)
EEG Completed; Results Pending  

## 2013-11-12 NOTE — Progress Notes (Signed)
CARE MANAGEMENT NOTE 11/12/2013  Patient:  Rachael [name], Rachael [name]   Account Number:  [id number]  Date Initiated:  11/12/2013  Documentation initiated by:  DAVIS,RHONDA  Subjective/Objective Assessment:   pt with hx of htn/elevated bp/some confusion/hx of ckd III/ k+2.3 on arrival/fld restriction and careful use of antihypertensive due to ckd/     Action/Plan:   home when stable /lives alone may need /alf vs snf placement due to safety factors   Anticipated DC Date:  11/15/2013   Anticipated DC Plan:  HOME/SELF CARE  In-house referral  NA      DC Planning Services  NA      [facility] Choice  NA   Choice offered to / List presented to:  NA   DME arranged  NA      DME agency  NA     [place] arranged  NA      [place] agency  NA   Status of service:  In process, will continue to follow Medicare Important Message given?  NA - LOS <3 / Initial given by admissions (If response is "NO", the following Medicare IM given date fields will be blank) Date Medicare IM given:   Date Additional Medicare IM given:    Discharge Disposition:    Per UR Regulation:  Reviewed for med. necessity/level of care/duration of stay  If discussed at [place] of Stay Meetings, dates discussed:    Comments:  [id number]/Rhonda Eldridge Dace, [place], [place] [id number] Chart Reviewed for discharge and hospital needs. Discharge needs at time of review: None present will follow for needs. Review of patient progress due on [id number].

## 2013-11-12 NOTE — Consult Note (Signed)
Patient seen, examined, and I agree with the above documentation, including the assessment and plan. Patient with heme-positive, dark stool. Patient has been on full-strength aspirin. Agree with upper endoscopy to rule out ulcer disease or other cause for GI bleeding. Hemoglobin is down slightly though not dramatically. Certainly no brisk bleeding at present She is being treated for hypertensive urgency and pressure has come down to this point If blood pressure remains improved, we'll proceed upper endoscopy for diagnosis given heme-positive stools and declining blood count The nature of the procedure, as well as the risks, benefits, and alternatives were carefully and thoroughly reviewed with the patient. Ample time for discussion and questions allowed. The patient understood, was satisfied, and agreed to proceed.

## 2013-11-12 NOTE — Telephone Encounter (Signed)
Pt was admitted to hospital 11/11/13.

## 2013-11-12 NOTE — Progress Notes (Signed)
**Rachael Rachael** Rachael Rachael  Rachael Rachael FUX:[id number] DOB: March 15, 1924 DOA: 11/11/2013 PCP: Alesia Richards, MD  Assessment/Plan:  Hypertensive urgency  Will continue patient's home medications including Catapres 0.1 3 times a day and Ziac 5/6.2 has been changed to Bisoprolol 10 mg po daily. Will d/c HCTZ due to renal insufficiency.Amlodipine  5 mg by mouth daily was added yesterday, will increase the dose of Amlodipine to 10 mg po daily as her BP continues to be elevated.Burnis Medin start hydralazine 25 mg by mouth every 6 hours when necessary for BP greater than 160/100.  Patient's medications will need to be adjusted before discharge   Melena  Likely due to aspirin use, gastric erosions versus ulcer  Patient has guaiac-positive stools, ED physician had called Dr. Collene Mares, who will see the patient in the morning. We'll keep the patient n.p.o. for possible EGD in a.m. Will check H&H every 6 hours. Hemoglobin is stable at this time so don't need of blood transfusion. Continue to monitor H&H.  We'll start the patient on IV Protonix infusion   CKD  Creatinine has been stable Patient has CKD. stage III, will follow BMP in a.m.   History of double vision  As per patient , she has these episodes while she puts on new prescription glasses. No recent episode of double vision, But she has a history of TIA in the past. CT head did not show any significant abnormality.   History of syncope  No recent episodes of passing out, last episode was more than 6 weeks ago. We'll obtain 2-D echocardiogram and check cardiac enzymes time 3. Patient might also need outpatient Holter monitoring or event monitor to ascertain the cause of syncope. We'll also obtain EEG to rule out seizure.   Hypothyroidism  Continue Synthroid   Hypokalemia  Replaced potassium and check BMP in a.m.   Lower extremity edema  ? Cause, check echocardiogram in a.m. Will hold Lasix at this time due to worsening renal  functions.   DVT prophylaxis  SCDs   Code Status: DNR Family Communication: *Spoke to daughter at bedside on 11/11/13 Disposition Plan: Home when stable   Consultants:  None  Procedures:  None  Antibiotics: None  HPI/Subjective: Patient seen and examined, admitted with hypertensive urgency, melena. BP continues to be elevated.  Objective: Filed Vitals:   11/12/13 0600  BP: 202/50  Pulse: 72  Temp:   Resp: 21    Intake/Output Summary (Last 24 hours) at 11/12/13 0837 Last data filed at 11/12/13 0600  Gross per 24 hour  Intake    770 ml  Output    400 ml  Net    370 ml   Filed Weights   11/12/13 0600  Weight: 52.6 kg (115 lb 15.4 oz)    Exam:  Physical Exam: Head: Normocephalic, atraumatic.  Lungs: Normal respiratory effort. B/L Clear to auscultation, no crackles or wheezes.  Heart: Regular RR. S1 and S2 normal  Abdomen: BS normoactive. Soft, Nondistended, non-tender.  Extremities: No pretibial edema, no erythema     Data Reviewed: Basic Metabolic Panel:  Recent Labs Lab 11/08/13 1050 11/11/13 1400 11/11/13 1430 11/12/13 0104  NA 142 142 140 142  K 3.6 3.2* 2.9* 3.8  CL 101 95* 93* 101  CO2 30 32  --  26  GLUCOSE 159* 203* 183* 136*  BUN 43* 52* 47* 51*  CREATININE 1.70* 1.84* 2.00* 2.09*  CALCIUM 9.6 10.1  --  9.2   Liver Function Tests:  Recent Labs Lab 11/08/13  1050 11/11/13 1400 11/12/13 0104  AST 27 28 40*  ALT 24 25 34  ALKPHOS 77 85 82  BILITOT 0.6 0.6 0.6  PROT 5.8* 6.8 6.0  ALBUMIN 4.0 4.1 3.6   No results found for this basename: LIPASE, AMYLASE,  in the last 168 hours No results found for this basename: AMMONIA,  in the last 168 hours CBC:  Recent Labs Lab 11/08/13 1050 11/11/13 1400 11/11/13 1430 11/11/13 1850 11/12/13 0104  WBC 5.7 7.6  --   --  9.4  NEUTROABS 4.3  --   --   --   --   HGB 11.1* 11.1* 12.9 11.0* 10.8*  HCT 33.3* 33.2* 38.0 32.8* 31.8*  MCV 92.1 89.2  --   --  89.6  PLT 181 268  --   --   203   Cardiac Enzymes:  Recent Labs Lab 11/11/13 1850 11/12/13 0104 11/12/13 0700  TROPONINI <0.30 <0.30 <0.30   BNP (last 3 results) No results found for this basename: PROBNP,  in the last 8760 hours CBG: No results found for this basename: GLUCAP,  in the last 168 hours  Recent Results (from the past 240 hour(s))  MRSA PCR SCREENING     Status: None   Collection Time    11/11/13  6:04 PM      Result Value Ref Range Status   MRSA by PCR NEGATIVE  NEGATIVE Final   Comment:            The GeneXpert MRSA Assay (FDA     approved for NASAL specimens     only), is one component of a     comprehensive MRSA colonization     surveillance program. It is not     intended to diagnose MRSA     infection nor to guide or     monitor treatment for     MRSA infections.     Studies: Ct Head Wo Contrast  11/11/2013   CLINICAL DATA:  Hypertension.  EXAM: CT HEAD WITHOUT CONTRAST  TECHNIQUE: Contiguous axial images were obtained from the base of the skull through the vertex without intravenous contrast.  COMPARISON:  Head CT 02/08/2013.  FINDINGS: Physiologic calcifications are noted in the basal ganglia bilaterally (left greater than right). Well-defined foci of low attenuation in the right globus pallidus are compatible with old lacunar infarctions. No acute intracranial abnormalities. Specifically, no evidence of acute intracranial hemorrhage, no definite findings of acute/subacute cerebral ischemia, no mass, mass effect, hydrocephalus or abnormal intra or extra-axial fluid collections. Visualized paranasal sinuses and mastoids are well pneumatized. No acute displaced skull fractures are identified.  IMPRESSION: 1. No acute intracranial abnormalities. 2. 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   Electronically Signed   By: Vinnie Langton M.D.   On: 11/11/2013 17:28    Scheduled Meds: . amLODipine  10 mg Oral Daily  . atorvastatin  20 mg Oral QPM  . bisoprolol  10 mg Oral Daily   . cloNIDine  0.1 mg Oral TID  . levothyroxine  187.5 mcg Oral Once per day on Mon Wed Fri   And  . [START ON 11/13/2013] levothyroxine  125 mcg Oral Once per day on Sun Tue Thu Sat  . [START ON 11/15/2013] pantoprazole (PROTONIX) IV  40 mg Intravenous Q12H  . sodium chloride  3 mL Intravenous Q12H   Continuous Infusions: . pantoprozole (PROTONIX) infusion 8 mg/hr (11/12/13 0600)    Active Problems:   TRANSIENT ISCHEMIC ATTACK   CKD (  chronic kidney disease), stage III   Hypothyroidism   GI bleed   Hypertensive urgency    Time spent: 25 min    [facility] Pager [phone]. If 7PM-7AM, please contact night-coverage at [email or web address], password [facility] 11/12/2013, 8:37 AM  LOS: 1 day

## 2013-11-12 NOTE — Progress Notes (Signed)
Report called to receiving RN on 4th floor by the dayshift RN around 1910. Currently awaiting transport for the past 30 minutes. Patient aware of her move to room 1418. Currently stable with no voiced concerns or complaints.

## 2013-11-12 NOTE — Procedures (Signed)
ELECTROENCEPHALOGRAM REPORT  Date of Study: 11/12/2013  Patient's Name: VIKTORYA ARGUIJO MRN: [id number] Date of Birth: 10/11/1923  Referring Provider: Dr. Eleonore Chiquito  Clinical History: This is an 80 year old woman with recurrent episodes of loss of consciousness  Medications: Amlodipine, atorvastatin, bisoprolol, clonidine, hydralazine, synthroid  Technical Summary: A multichannel digital EEG recording measured by the international 10-20 system with electrodes applied with paste and impedances below 5000 ohms performed in our laboratory with EKG monitoring in an awake and asleep patient.  Hyperventilation and photic stimulation were not performed.  The digital EEG was referentially recorded, reformatted, and digitally filtered in a variety of bipolar and referential montages for optimal display.  Spike detection software was employed.  Description: The patient is awake and asleep during the recording.  During maximal wakefulness, there is a symmetric, low voltage 8 Hz posterior dominant rhythm that attenuates with eye opening.  The record is symmetric.  During drowsiness and sleep, there is an increase in theta slowing of the background.  Vertex waves and symmetric sleep spindles were seen.  Hyperventilation and photic stimulation did not elicit any abnormalities.  There were no epileptiform discharges or electrographic seizures seen.    EKG lead was unremarkable.  Impression: This awake and drowsy EEG is normal.    Clinical Correlation: A normal EEG does not exclude a clinical diagnosis of epilepsy.  Clinical correlation is advised.   Ellouise Newer, M.D.

## 2013-11-13 ENCOUNTER — Encounter (HOSPITAL_COMMUNITY): Payer: Self-pay | Admitting: Internal Medicine

## 2013-11-13 DIAGNOSIS — K2960 Other gastritis without bleeding: Secondary | ICD-10-CM

## 2013-11-13 LAB — HEMOGLOBIN AND HEMATOCRIT, BLOOD
HCT: 30.9 % — ABNORMAL LOW (ref 36.0–46.0)
HEMATOCRIT: 30.1 % — AB (ref 36.0–46.0)
HEMOGLOBIN: 10.3 g/dL — AB (ref 12.0–15.0)
Hemoglobin: 10 g/dL — ABNORMAL LOW (ref 12.0–15.0)

## 2013-11-13 MED ORDER — PANTOPRAZOLE SODIUM 40 MG PO TBEC
40.0000 mg | DELAYED_RELEASE_TABLET | Freq: Two times a day (BID) | ORAL | Status: DC
  Administered 2013-11-13 – 2013-11-15 (×4): 40 mg via ORAL
  Filled 2013-11-13 (×5): qty 1

## 2013-11-13 MED ORDER — BOOST PLUS PO LIQD
237.0000 mL | Freq: Two times a day (BID) | ORAL | Status: DC
  Administered 2013-11-13 – 2013-11-15 (×4): 237 mL via ORAL
  Filled 2013-11-13 (×5): qty 237

## 2013-11-13 MED ORDER — MENTHOL 3 MG MT LOZG
1.0000 | LOZENGE | OROMUCOSAL | Status: DC | PRN
Start: 2013-11-13 — End: 2013-11-15
  Filled 2013-11-13: qty 9

## 2013-11-13 NOTE — Progress Notes (Signed)
Patient ID: Rachael [name], female   DOB: 1923-06-24, 80 y.o.   MRN: [id number] [facility] Progress Note  Subjective: Wants to get up and walk-says nobody will let her move.Marland Kitchen No Bm's today, eating well, no c/o pain Hgb 10-has drifted  Objective:  Vital signs in last 24 hours: Temp:  [97 F (36.1 C)-97.9 F (36.6 C)] 97 F (36.1 C) (06/23 0453) Pulse Rate:  [52-68] 68 (06/23 0453) Resp:  [12-26] 19 (06/23 0453) BP: (117-204)/(34-147) 171/54 mmHg (06/23 0958) SpO2:  [95 %-100 %] 96 % (06/23 0453) Weight:  [128 lb 15.5 oz (58.5 kg)] 128 lb 15.5 oz (58.5 kg) (06/22 2142) Last BM Date: 11/10/13 General:   Alert,  Well-developed, elderly WF   in NAD Heart:  Regular rate and rhythm; no murmurs Pulm;clear Abdomen:  Soft, nontender and nondistended. Normal bowel sounds, without guarding, and without rebound.   Extremities:  Without edema. Neurologic:  Alert and  oriented x4;  grossly normal neurologically. Psych:  Alert and cooperative. Normal mood and affect.  Intake/Output from previous day: 06/22 0701 - 06/23 0700 In: 143 [I.V.:143] Out: 550 [Urine:550] Intake/Output this shift: Total I/O In: 240 [P.O.:240] Out: 100 [Urine:100]  Lab Results:  Recent Labs  11/11/13 1400  11/12/13 0104 11/12/13 0700 11/12/13 1856 11/13/13 0650  WBC 7.6  --  9.4  --   --   --   HGB 11.1*  < > 10.8* 10.5* 10.4* 10.0*  HCT 33.2*  < > 31.8* 30.7* 31.3* 30.1*  PLT 268  --  203  --   --   --   < > = values in this interval not displayed. BMET  Recent Labs  11/11/13 1400 11/11/13 1430 11/12/13 0104  NA 142 140 142  K 3.2* 2.9* 3.8  CL 95* 93* 101  CO2 32  --  26  GLUCOSE 203* 183* 136*  BUN 52* 47* 51*  CREATININE 1.84* 2.00* 2.09*  CALCIUM 10.1  --  9.2   LFT  Recent Labs  11/12/13 0104  PROT 6.0  ALBUMIN 3.6  AST 40*  ALT 34  ALKPHOS 82  BILITOT 0.6   PT/INR  Recent Labs  11/11/13 1400  LABPROT 13.6  INR 1.06     Assessment / Plan: #1) 80 yo  female with melena- erosive gastropathy  on EGD., bx neg for hypylori No active bleeding but hgb has drifted a bit Continue serial hgbs BID PPI No plans for colonoscopy at present Start ambulating with help Active Problems:   TRANSIENT ISCHEMIC ATTACK   CKD (chronic kidney disease), stage III   Hypothyroidism   GI bleed   Hypertensive urgency   Protein-calorie malnutrition, severe   Erosive gastritis     LOS: 2 days   Amy Esterwood  11/13/2013, 10:21 AM

## 2013-11-13 NOTE — Progress Notes (Signed)
Patient requested to use bedpan, she did not want to get OOB.  RN encouraged and educated pt on the importance of mobility. RN assisted pt to [facility] then to chair. Pt required minimal assistance with the use of a walker for safety. Will continue to educate and encourage pt to ambulate with staff. PT has been ordered, and informed pt that they would be working with her either today or tomorrow. J.Zappia, RN

## 2013-11-13 NOTE — Progress Notes (Addendum)
[facility] PROGRESS NOTE  AVIAN GREENAWALT DXA:[id number] DOB: 12-09-1923 DOA: 11/11/2013 PCP: Alesia Richards, MD   Interval history\ 80 year old female who has a past medical history of Vitamin D deficiency; Diverticulosis; Atherosclerotic heart disease; TIA (transient ischemic attack); CKD (chronic kidney disease); Inguinal hernia; Broken hip; Complication of anesthesia; Hypertension; Hyperlipidemia; Hypothyroidism; Senile osteoporosis; Osteopenia; Vitamin D deficiency; and Elevated hemoglobin A1c.  Today presents to the ED with chief complaint of progressively rising blood pressure over the past few days. At home today blood pressure was 233/89, she denies having any headache, no chest pain no shortness of breath no blurred vision. She denies passing out today. Patient has been taking her medications as prescribed she denies missing any doses. As per patient she saw her primary care physician on Wednesday last week, as she was also having black colored stools. She denies frank blood in the stool denies diarrhea. She was told by the primary care physician to go to the ED in she started feeling weak. Patient's last colonoscopy was in 2009 by Dr. Henrene Pastor. She did not have endoscopy. Patient has been taking aspirin 325 mg by mouth daily for past 2 years.  Patient also complains of intermittent episodes of syncope, the last episode was 2 months ago, at home though she did not come to the ED. She had a previous episode at a funeral 6 months ago patient passed out for a few seconds. She denies history of seizures. Patient also has been complaining of double vision intermittently, the last episode was more than 2 weeks ago at home, which occurred after patient put on her new prescription glasses. As per patient the prescription glasses has been changed this year, and she got double vision after she wore those glasses.  Patient does have a history of TIA in the past.  Today she denies slurred  speech, no weakness of extremities, no blurred vision no double vision no syncope    Assessment/Plan:   Hypertensive urgency  Resolved Will continue patient's home medications including Catapres 0.1 3 times a day changed to catapres 0.2 mg po TID and Ziac 5/6.2 has been changed to Bisoprolol 10 mg po daily. Will d/c HCTZ due to renal insufficiency.Amlodipine  5 mg by mouth daily was added yesterday, will increase the dose of Amlodipine to 10 mg po daily as her BP continues to be elevated.Burnis Medin start hydralazine 25 mg by mouth every 6 hours when necessary for BP greater than 160/100.  Patient's medications have been adjusted, can be discharged on the current regimen when ready.  Melena  EGD showed erosive gastropathy Likely due to aspirin use, gastric erosions versus ulcer  As per GI, daily Protonix 40 mg twice a day and OK to use aspirin. Will continue enteric-coated full dose aspirin.  CKD  Creatinine has been stable Patient has CKD. stage III, will follow BMP in a.m.   History of double vision  As per patient , she has these episodes while she puts on new prescription glasses. No recent episode of double vision, But she has a history of TIA in the past. CT head did not show any significant abnormality.   History of syncope  No recent episodes of passing out, last episode was more than 6 weeks ago.  2-D echocardiogram showed grade 2 diastolic dysfunction and  cardiac enzymes time 3 were negative. Patient might also need outpatient Holter monitoring or event monitor to ascertain the cause of syncope. Called cardiology and they will set up Holter monitor as  outpatient EEG shows no significant abnormality  Hypokalemia  Replaced potassium and check BMP in a.m.   Lower extremity edema  ? Cause, check echocardiogram in a.m. Will hold Lasix at this time due to worsening renal functions.   DVT prophylaxis  SCDs   Code Status: DNR Family Communication: *Spoke to daughter at bedside  on 11/11/13 Disposition Plan: Pending PT evaluation, may be discharged in 24-48 hours   Consultants:  None  Procedures:  None  Antibiotics: None  HPI/Subjective: Patient seen and examined, admitted with hypertensive urgency, melena. BP has now improved after adjusting her medications   Objective: Filed Vitals:   11/13/13 1451  BP: 147/50  Pulse: 66  Temp: 97.9 F (36.6 C)  Resp: 20    Intake/Output Summary (Last 24 hours) at 11/13/13 1609 Last data filed at 11/13/13 1452  Gross per 24 hour  Intake    363 ml  Output    500 ml  Net   -137 ml   Filed Weights   11/12/13 0600 11/12/13 2142  Weight: 52.6 kg (115 lb 15.4 oz) 58.5 kg (128 lb 15.5 oz)    Exam:  Physical Exam: Head: Normocephalic, atraumatic.  Lungs: Normal respiratory effort. B/L Clear to auscultation, no crackles or wheezes.  Heart: Regular RR. S1 and S2 normal  Abdomen: BS normoactive. Soft, Nondistended, non-tender.  Extremities: No pretibial edema, no erythema     Data Reviewed: Basic Metabolic Panel:  Recent Labs Lab 11/08/13 1050 11/11/13 1400 11/11/13 1430 11/12/13 0104  NA 142 142 140 142  K 3.6 3.2* 2.9* 3.8  CL 101 95* 93* 101  CO2 30 32  --  26  GLUCOSE 159* 203* 183* 136*  BUN 43* 52* 47* 51*  CREATININE 1.70* 1.84* 2.00* 2.09*  CALCIUM 9.6 10.1  --  9.2   Liver Function Tests:  Recent Labs Lab 11/08/13 1050 11/11/13 1400 11/12/13 0104  AST 27 28 40*  ALT 24 25 34  ALKPHOS 77 85 82  BILITOT 0.6 0.6 0.6  PROT 5.8* 6.8 6.0  ALBUMIN 4.0 4.1 3.6   No results found for this basename: LIPASE, AMYLASE,  in the last 168 hours No results found for this basename: AMMONIA,  in the last 168 hours CBC:  Recent Labs Lab 11/08/13 1050 11/11/13 1400  11/12/13 0104 11/12/13 0700 11/12/13 1856 11/13/13 0650 11/13/13 1518  WBC 5.7 7.6  --  9.4  --   --   --   --   NEUTROABS 4.3  --   --   --   --   --   --   --   HGB 11.1* 11.1*  < > 10.8* 10.5* 10.4* 10.0* 10.3*  HCT  33.3* 33.2*  < > 31.8* 30.7* 31.3* 30.1* 30.9*  MCV 92.1 89.2  --  89.6  --   --   --   --   PLT 181 268  --  203  --   --   --   --   < > = values in this interval not displayed. Cardiac Enzymes:  Recent Labs Lab 11/11/13 1850 11/12/13 0104 11/12/13 0700  TROPONINI <0.30 <0.30 <0.30   BNP (last 3 results) No results found for this basename: PROBNP,  in the last 8760 hours CBG: No results found for this basename: GLUCAP,  in the last 168 hours  Recent Results (from the past 240 hour(s))  MRSA PCR SCREENING     Status: None   Collection Time    11/11/13  6:04 PM      Result Value Ref Range Status   MRSA by PCR NEGATIVE  NEGATIVE Final   Comment:            The GeneXpert MRSA Assay (FDA     approved for NASAL specimens     only), is one component of a     comprehensive MRSA colonization     surveillance program. It is not     intended to diagnose MRSA     infection nor to guide or     monitor treatment for     MRSA infections.     Studies: Ct Head Wo Contrast  11/11/2013   CLINICAL DATA:  Hypertension.  EXAM: CT HEAD WITHOUT CONTRAST  TECHNIQUE: Contiguous axial images were obtained from the base of the skull through the vertex without intravenous contrast.  COMPARISON:  Head CT 02/08/2013.  FINDINGS: Physiologic calcifications are noted in the basal ganglia bilaterally (left greater than right). Well-defined foci of low attenuation in the right globus pallidus are compatible with old lacunar infarctions. No acute intracranial abnormalities. Specifically, no evidence of acute intracranial hemorrhage, no definite findings of acute/subacute cerebral ischemia, no mass, mass effect, hydrocephalus or abnormal intra or extra-axial fluid collections. Visualized paranasal sinuses and mastoids are well pneumatized. No acute displaced skull fractures are identified.  IMPRESSION: 1. No acute intracranial abnormalities. 2. 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   Electronically  Signed   By: Vinnie Langton M.D.   On: 11/11/2013 17:28    Scheduled Meds: . amLODipine  10 mg Oral Daily  . atorvastatin  20 mg Oral QPM  . bisoprolol  10 mg Oral Daily  . cloNIDine  0.2 mg Oral TID  . lactose free nutrition  237 mL Oral BID BM  . levothyroxine  187.5 mcg Oral Once per day on Mon Wed Fri   And  . levothyroxine  125 mcg Oral Once per day on Sun Tue Thu Sat  . pantoprazole  40 mg Oral BID AC  . sodium chloride  3 mL Intravenous Q12H   Continuous Infusions:    Active Problems:   TRANSIENT ISCHEMIC ATTACK   CKD (chronic kidney disease), stage III   Hypothyroidism   GI bleed   Hypertensive urgency   Protein-calorie malnutrition, severe   Erosive gastritis    Time spent: 25 min    [facility] Pager [phone]. If 7PM-7AM, please contact night-coverage at [email or web address], password [facility] 11/13/2013, 4:09 PM  LOS: 2 days

## 2013-11-13 NOTE — Progress Notes (Signed)
NUTRITION FOLLOW UP  Intervention:   -Recommend Boost Plus BID -Will continue to monitor  Nutrition Dx:   Inadequate oral intake related to inability to eat as evidenced by npo status-improving with diet advancement   Goal:   Pt to meet >/= 90% of their estimated nutrition needs - progressing with diet advancement   Monitor:   Total protein/energy intake, labs, weights  Assessment:   6/22:  -EGD today. Currently NPO  -Boost prior to admit  -"I ate OK" but then "I don't know. Patient unable to answer questions.  -Last seen by inpatient RD 09/29/2012.  -Weight loss of 15 lbs since 6/18 (11%)  -Nutrition focused physical exam deferred as patient on the way to endo. Decreased muscle mass and body fat observed.  -Patient is underweight  6/23: -Pt's diet advanced to Heart Healthy on 6/22 -Reported 3-4 weeks of poor PO intake pta. Consumed Boost BID -Ate >75% of breakfast, was not hungry for lunch. Has dinner ordered. Will order Boost Plus to continue with supplement regimen -Has not had BM in 3 days, recommend ambulation, fluids, and fiber to assist in bowel movements  Height: Ht Readings from Last 1 Encounters:  11/12/13 5' 8.5" (1.74 m)    Weight Status:   Wt Readings from Last 1 Encounters:  11/12/13 128 lb 15.5 oz (58.5 kg)    Re-estimated needs:  Kcal: 1500-1700  Protein: 55-65 gm  Fluid: >/=1.5L   Skin: WDL  Diet Order: Cardiac   Intake/Output Summary (Last 24 hours) at 11/13/13 1343 Last data filed at 11/13/13 1018  Gross per 24 hour  Intake    273 ml  Output    650 ml  Net   -377 ml    Last BM: 6/20   Labs:   Recent Labs Lab 11/08/13 1050 11/11/13 1400 11/11/13 1430 11/12/13 0104  NA 142 142 140 142  K 3.6 3.2* 2.9* 3.8  CL 101 95* 93* 101  CO2 30 32  --  26  BUN 43* 52* 47* 51*  CREATININE 1.70* 1.84* 2.00* 2.09*  CALCIUM 9.6 10.1  --  9.2  GLUCOSE 159* 203* 183* 136*    CBG (last 3)  No results found for this basename: GLUCAP,  in  the last 72 hours  Scheduled Meds: . amLODipine  10 mg Oral Daily  . atorvastatin  20 mg Oral QPM  . bisoprolol  10 mg Oral Daily  . cloNIDine  0.2 mg Oral TID  . levothyroxine  187.5 mcg Oral Once per day on Mon Wed Fri   And  . levothyroxine  125 mcg Oral Once per day on Sun Tue Thu Sat  . pantoprazole  40 mg Oral BID AC  . sodium chloride  3 mL Intravenous Q12H    Continuous Infusions:   Atlee Abide MS RD LDN Clinical Dietitian [phone]

## 2013-11-13 NOTE — Progress Notes (Signed)
Patient seen, examined, and I agree with the above documentation, including the assessment and plan. Path negative for H pylor BID PPI

## 2013-11-14 ENCOUNTER — Encounter: Payer: Self-pay | Admitting: Internal Medicine

## 2013-11-14 LAB — CBC
HCT: 27 % — ABNORMAL LOW (ref 36.0–46.0)
HEMOGLOBIN: 9.4 g/dL — AB (ref 12.0–15.0)
MCH: 31.2 pg (ref 26.0–34.0)
MCHC: 34.8 g/dL (ref 30.0–36.0)
MCV: 89.7 fL (ref 78.0–100.0)
Platelets: 199 10*3/uL (ref 150–400)
RBC: 3.01 MIL/uL — AB (ref 3.87–5.11)
RDW: 13.2 % (ref 11.5–15.5)
WBC: 8.5 10*3/uL (ref 4.0–10.5)

## 2013-11-14 LAB — BASIC METABOLIC PANEL
BUN: 56 mg/dL — ABNORMAL HIGH (ref 6–23)
CALCIUM: 8.6 mg/dL (ref 8.4–10.5)
CO2: 24 mEq/L (ref 19–32)
CREATININE: 2.8 mg/dL — AB (ref 0.50–1.10)
Chloride: 99 mEq/L (ref 96–112)
GFR calc Af Amer: 16 mL/min — ABNORMAL LOW (ref >90)
GFR calc non Af Amer: 14 mL/min — ABNORMAL LOW (ref >90)
GLUCOSE: 108 mg/dL — AB (ref 70–99)
Potassium: 3.8 mEq/L (ref 3.7–5.3)
Sodium: 138 mEq/L (ref 137–147)

## 2013-11-14 MED ORDER — SODIUM CHLORIDE 0.9 % IV SOLN
INTRAVENOUS | Status: DC
  Administered 2013-11-14: 50 mL/h via INTRAVENOUS

## 2013-11-14 MED ORDER — SODIUM CHLORIDE 0.9 % IV SOLN
INTRAVENOUS | Status: AC
  Administered 2013-11-14: 50 mL/h via INTRAVENOUS

## 2013-11-14 NOTE — Progress Notes (Signed)
Patient ID: Rachael [name], female   DOB: [birth year], 80 y.o.   MRN: [id number]   Subjective: Doing "OK ", no c/o abdominal pain, nausea, no Bm today or yeterday. HGB 9.4  Objective:  Vital signs in last 24 hours: Temp:  [97.5 F (36.4 C)-98.1 F (36.7 C)] 97.5 F (36.4 C) (06/24 0522) Pulse Rate:  [61-66] 61 (06/24 0522) Resp:  [18-20] 20 (06/24 0522) BP: (147-155)/(48-50) 153/48 mmHg (06/24 0522) SpO2:  [96 %-98 %] 96 % (06/24 0522) Weight:  [129 lb 9.6 oz (58.786 kg)] 129 lb 9.6 oz (58.786 kg) (06/24 0522) Last BM Date: 11/10/13 General:   Alert,  Well-developed, elderly WF   in NAD Heart:  Regular rate and rhythm; no murmurs Pulm;clear Abdomen:  Soft, nontender and nondistended. Normal bowel sounds, without guarding, and without rebound.   Extremities:  Without edema. Neurologic:  Alert and  oriented x4;  grossly normal neurologically. Psych:  Alert and cooperative. Normal mood and affect.  Intake/Output from previous day: 06/23 0701 - 06/24 0700 In: 570 [P.O.:570] Out: 600 [Urine:600] Intake/Output this shift:    Lab Results:  Recent Labs  11/11/13 1400  11/12/13 0104  11/13/13 0650 11/13/13 1518 11/14/13 0504  WBC 7.6  --  9.4  --   --   --  8.5  HGB 11.1*  < > 10.8*  < > 10.0* 10.3* 9.4*  HCT 33.2*  < > 31.8*  < > 30.1* 30.9* 27.0*  PLT 268  --  203  --   --   --  199  < > = values in this interval not displayed. BMET  Recent Labs  11/11/13 1400 11/11/13 1430 11/12/13 0104 11/14/13 0504  NA 142 140 142 138  K 3.2* 2.9* 3.8 3.8  CL 95* 93* 101 99  CO2 32  --  26 24  GLUCOSE 203* 183* 136* 108*  BUN 52* 47* 51* 56*  CREATININE 1.84* 2.00* 2.09* 2.80*  CALCIUM 10.1  --  9.2 8.6   LFT  Recent Labs  11/12/13 0104  PROT 6.0  ALBUMIN 3.6  AST 40*  ALT 34  ALKPHOS 82  BILITOT 0.6   PT/INR  Recent Labs  11/11/13 1400  LABPROT 13.6  INR 1.06   l   Assessment / Plan: #1 ) 80 yo female with melena-erosive gastritis on EGD. No  active bleeding HGb drifting slowly- hopefully just equilibrating No plan for colonoscopy Continue serial hgb- transfuse if indicated BID PPI GI will sign off, available if needed Active Problems:   TRANSIENT ISCHEMIC ATTACK   CKD (chronic kidney disease), stage III   Hypothyroidism   GI bleed   Hypertensive urgency   Protein-calorie malnutrition, severe   Erosive gastritis     LOS: 3 days   Amy Esterwood  11/14/2013, 11:07 AM

## 2013-11-14 NOTE — Progress Notes (Signed)
Patient seen, examined, and I agree with the above documentation, including the assessment and plan. No sign of active bleeding, unclear cause of decline in Hgb.  No upper GI source for ongoing bleeding found.  Gastritis was mild.  Previous colonoscopy with diverticulosis, but no hematochezia or BRBPR Please notify GI in the event of active bleeding or with questions/concerns

## 2013-11-14 NOTE — Progress Notes (Signed)
Patient ID: Rachael [name], female   DOB: Oct 05, 1923, 80 y.o.   MRN: [id number]  [facility] PROGRESS NOTE  Rachael [name] ZMO:[id number] DOB: [birth year] DOA: 11/11/2013 PCP: Alesia Richards, MD  Brief narrative: 80 year old female with CKD stage III - IV, HTN, diverticulosis, anemia of chronic diease, vit D deficiency presented to ED after noted to have significantly elevated BP at home but with no chest pain, shortness of breath, no headaches or visual changes. She has been taking medications as prescribed. She has noted black stool several days in duration but no frank blood in the stool. Patient has been taking aspirin 325 mg by mouth daily for past 2 years.   Assessment/Plan:  Hypertensive urgency  - Resolved and BP remains stable  - continue Catapres 0.2 mg po TID and Bisoprolol 10 mg po daily, amlodipine 5 mg daily - continue hydralazine as needed  Melena  - EGD showed erosive gastritis  - Likely due to aspirin use, gastric erosions versus ulcer  - continue protonix BID, continue EC aspirin  Acute on chronic blood loss anemia - unclear reason for continued Hg drop - CBC in AM CKD  - Cr trending up - will provide IVF for next 12 hours, very gentle hydration given LE swelling, and repeat BMP in AM - if Cr trending down, pt may be discharged in AM History of double vision  - As per patient, she has these episodes while she puts on new prescription glasses.  - No recent episode of double vision, But she has a history of TIA in the past. CT head did not show any significant abnormality.  History of syncope  - No recent episodes of passing out, last episode was more than 6 weeks ago.  - 2-D echocardiogram showed grade 2 diastolic dysfunction and cardiac enzymes time 3 were negative.  - cardiology will set up Holter monitor as outpatient Hypokalemia  - Replaced and WNL this AM, repeat BMP in AM Lower extremity edema  - Lasix on hold due to worsening Cr - monitor  closely and per pt not any worse this AM Chronic CHF  - euvolemic this AM - Cr up so will give very gentle hydration and repeat BMP in AM - weight this AM is 129 lbs and will check weight in AM - lasix on hold as noted above  HLD - continue statin  DVT prophylaxis  - SCDs  Hypothyroidism - continue synthroid  Moderate malnutrition - secondary to acute illness - says she has no appetite - encouraged PO intake   Code Status: DNR  Family Communication: Pt at bedside  Disposition Plan: D/C in AM  Consultants:  None Procedures:  None Antibiotics:   None   HPI/Subjective: No events overnight.   Objective: Filed Vitals:   11/13/13 1451 11/13/13 2203 11/14/13 0522 11/14/13 1408  BP: 147/50 155/50 153/48 139/80  Pulse: 66 61 61 61  Temp: 97.9 F (36.6 C) 98.1 F (36.7 C) 97.5 F (36.4 C) 97.7 F (36.5 C)  TempSrc: Oral Oral Oral Oral  Resp: 20 18 20 20   Height:      Weight:   58.786 kg (129 lb 9.6 oz)   SpO2: 98% 97% 96% 100%    Intake/Output Summary (Last 24 hours) at 11/14/13 1642 Last data filed at 11/14/13 1500  Gross per 24 hour  Intake 453.33 ml  Output    700 ml  Net -246.67 ml    Exam:   General:  Pt is alert, follows commands  appropriately, not in acute distress  Cardiovascular: Regular rate and rhythm, no rubs, no gallops  Respiratory: Clear to auscultation bilaterally, no wheezing, no crackles, no rhonchi  Abdomen: Soft, non tender, non distended, bowel sounds present, no guarding  Extremities: pulses DP and PT palpable bilaterally, LE pitting edema + 1  Neuro: Grossly nonfocal  Data Reviewed: Basic Metabolic Panel:  Recent Labs Lab 11/08/13 1050 11/11/13 1400 11/11/13 1430 11/12/13 0104 11/14/13 0504  NA 142 142 140 142 138  K 3.6 3.2* 2.9* 3.8 3.8  CL 101 95* 93* 101 99  CO2 30 32  --  26 24  GLUCOSE 159* 203* 183* 136* 108*  BUN 43* 52* 47* 51* 56*  CREATININE 1.70* 1.84* 2.00* 2.09* 2.80*  CALCIUM 9.6 10.1  --  9.2 8.6    Liver Function Tests:  Recent Labs Lab 11/08/13 1050 11/11/13 1400 11/12/13 0104  AST 27 28 40*  ALT 24 25 34  ALKPHOS 77 85 82  BILITOT 0.6 0.6 0.6  PROT 5.8* 6.8 6.0  ALBUMIN 4.0 4.1 3.6   CBC:  Recent Labs Lab 11/08/13 1050 11/11/13 1400  11/12/13 0104 11/12/13 0700 11/12/13 1856 11/13/13 0650 11/13/13 1518 11/14/13 0504  WBC 5.7 7.6  --  9.4  --   --   --   --  8.5  NEUTROABS 4.3  --   --   --   --   --   --   --   --   HGB 11.1* 11.1*  < > 10.8* 10.5* 10.4* 10.0* 10.3* 9.4*  HCT 33.3* 33.2*  < > 31.8* 30.7* 31.3* 30.1* 30.9* 27.0*  MCV 92.1 89.2  --  89.6  --   --   --   --  89.7  PLT 181 268  --  203  --   --   --   --  199  < > = values in this interval not displayed. Cardiac Enzymes:  Recent Labs Lab 11/11/13 1850 11/12/13 0104 11/12/13 0700  TROPONINI <0.30 <0.30 <0.30    Recent Results (from the past 240 hour(s))  MRSA PCR SCREENING     Status: None   Collection Time    11/11/13  6:04 PM      Result Value Ref Range Status   MRSA by PCR NEGATIVE  NEGATIVE Final   Comment:            The GeneXpert MRSA Assay (FDA     approved for NASAL specimens     only), is one component of a     comprehensive MRSA colonization     surveillance program. It is not     intended to diagnose MRSA     infection nor to guide or     monitor treatment for     MRSA infections.     Scheduled Meds: . amLODipine  10 mg Oral Daily  . atorvastatin  20 mg Oral QPM  . bisoprolol  10 mg Oral Daily  . cloNIDine  0.2 mg Oral TID  . lactose free nutrition  237 mL Oral BID BM  . levothyroxine  187.5 mcg Oral Once per day on Mon Wed Fri   And  . levothyroxine  125 mcg Oral Once per day on Sun Tue Thu Sat  . pantoprazole  40 mg Oral BID AC  . sodium chloride  3 mL Intravenous Q12H   Continuous Infusions: . sodium chloride 50 mL/hr (11/14/13 1456)     Faye Ramsay, MD  [facility]  Pager [phone]  If 7PM-7AM, please contact night-coverage [email or web address] Password  [facility] 11/14/2013, 4:42 PM   LOS: 3 days

## 2013-11-14 NOTE — Care Management Note (Addendum)
    Page 1 of 2   11/15/2013     3:48:12 PM CARE MANAGEMENT NOTE 11/15/2013  Patient:  Rachael [name], Rachael [name]   Account Number:  [id number]  Date Initiated:  11/12/2013  Documentation initiated by:  DAVIS,RHONDA  Subjective/Objective Assessment:   pt with hx of htn/elevated bp/some confusion/hx of ckd III/ k+2.3 on arrival/fld restriction and careful use of antihypertensive due to ckd/     Action/Plan:   home when stable /lives alone may need /alf vs snf placement due to safety factors   Anticipated DC Date:  11/15/2013   Anticipated DC Plan:  [place]  In-house referral  NA      DC Planning Services  CM consult      PAC Choice  NA   Choice offered to / List presented to:  C-1 Patient   DME arranged  NA      DME agency  NA     [place] arranged  HH-1 RN  [place]   Status of service:  Completed, signed off Medicare Important Message given?  NA - LOS <3 / Initial given by admissions (If response is "NO", the following Medicare IM given date fields will be blank) Date Medicare IM given:  11/14/2013 Date Additional Medicare IM given:    Discharge Disposition:  [place]  Per UR Regulation:  Reviewed for med. necessity/level of care/duration of stay  If discussed at [place] of Stay Meetings, dates discussed:    Comments:  11/15/13 KATHY MAHABIR RN,BSN [facility] [place].  11/14/13 KATHY MAHABIR RN,BSN [facility] [place].PT-HH.PATIENT CHOSE [facility] SINCE USED THEM IN THE PAST.TC DEBBIE REP FOR [facility] FOR REFERRAL.AWAIT FINAL HPT ORDER.  [place], RN, BSN, [place] [phone] Chart Reviewed for discharge and hospital needs. Discharge needs at time of review: None present will follow for needs. Review of patient progress due on [id number].

## 2013-11-14 NOTE — Evaluation (Signed)
Physical Therapy Evaluation Patient Details Name: BRITANY CALLICOTT MRN: [id number] DOB: 1923/08/25 Today's Date: 11/14/2013   History of Present Illness  80 year old female who has a past medical history of Vitamin D deficiency; Diverticulosis; Atherosclerotic heart disease; TIA (transient ischemic attack); CKD (chronic kidney disease); Inguinal hernia; bil hip fractures; Hypertension; Hyperlipidemia; Hypothyroidism; Senile osteoporosis; Osteopenia; Vitamin D deficiency; and Elevated hemoglobin A1c admitted 6/21 for hypertensive urgency and melena.  Clinical Impression  Pt admitted with above. Pt currently with functional limitations due to the deficits listed below (see PT Problem List).  Pt will benefit from skilled PT to increase their independence and safety with mobility to allow discharge to the venue listed below.  Pt ambulated in hallway and steady with RW however reports hx of bil hip fx and reports recent "not falls" to sitting position due to syncope.  Recommended pt have initial supervision upon d/c and use RW upon return home.      Follow Up Recommendations Home health PT;Supervision for mobility/OOB (initial supervision)    Equipment Recommendations  None recommended by PT    Recommendations for Other Services       Precautions / Restrictions Precautions Precautions: Fall      Mobility  Bed Mobility Overal bed mobility: Modified Independent             General bed mobility comments: increased time  Transfers Overall transfer level: Needs assistance Equipment used: None Transfers: Sit to/from [employer] Transfers Sit to Stand: Min guard Stand pivot transfers: Min guard       General transfer comment: verbal cues for technique, observed uncontrolled descent, pt then performed sit to stand without UEs however relied on posterior lean against chair to rise  Ambulation/Gait Ambulation/Gait assistance: Min guard Ambulation Distance (Feet): 200  Feet Assistive device: Rolling walker (2 wheeled) Gait Pattern/deviations: Step-through pattern Gait velocity: decr   General Gait Details: pt reports generalized weakness, denies recent hx of falls stating last couple falls were to sitting position and due to syncope, encouraged to use RW upon d/c since she normally household ambulates without AD  Stairs            Wheelchair Mobility    Modified Rankin (Stroke Patients Only)       Balance                                             Pertinent Vitals/Pain Denies pain    Home Living Family/patient expects to be discharged to:: Private residence Living Arrangements: Alone Available Help at Discharge: Friend(s);Available PRN/intermittently Type of Home: House       Home Layout: One level Home Equipment: [place] - 2 wheels;Wheelchair - manual;Cane - single point      Prior Function Level of Independence: Independent with assistive device(s)         Comments: states she usually ambulates around home without assistive device     Hand Dominance        Extremity/Trunk Assessment               Lower Extremity Assessment: Overall WFL for tasks assessed         Communication   Communication: No difficulties  Cognition Arousal/Alertness: Awake/alert Behavior During Therapy: WFL for tasks assessed/performed Overall Cognitive Status: Within Functional Limits for tasks assessed  General Comments      Exercises        Assessment/Plan    PT Assessment Patient needs continued PT services  PT Diagnosis Difficulty walking;Generalized weakness   PT Problem List Decreased activity tolerance;Decreased mobility;Decreased strength  PT Treatment Interventions DME instruction;Gait training;Functional mobility training;Therapeutic activities;Therapeutic exercise;Patient/family education   PT Goals (Current goals can be found in the Care Plan section) Acute  Rehab PT Goals PT Goal Formulation: With patient Time For Goal Achievement: 11/21/13 Potential to Achieve Goals: Good    Frequency Min 3X/week   Barriers to discharge        Co-evaluation               End of Session   Activity Tolerance: Patient tolerated treatment well Patient left: in bed;with call bell/phone within reach;with bed alarm set           Time: 6759-1638 PT Time Calculation (min): 20 min   Charges:   PT Evaluation $Initial PT Evaluation Tier I: 1 Procedure PT Treatments $Gait Training: 8-22 mins   PT G Codes:          LEMYRE,KATHrine E 11/14/2013, 9:33 AM Carmelia Bake, PT, DPT 11/14/2013 Pager: [phone]

## 2013-11-15 DIAGNOSIS — I2510 Atherosclerotic heart disease of native coronary artery without angina pectoris: Secondary | ICD-10-CM

## 2013-11-15 DIAGNOSIS — I498 Other specified cardiac arrhythmias: Secondary | ICD-10-CM

## 2013-11-15 LAB — BASIC METABOLIC PANEL
BUN: 57 mg/dL — AB (ref 6–23)
CALCIUM: 8.6 mg/dL (ref 8.4–10.5)
CO2: 24 mEq/L (ref 19–32)
CREATININE: 2.83 mg/dL — AB (ref 0.50–1.10)
Chloride: 97 mEq/L (ref 96–112)
GFR calc Af Amer: 16 mL/min — ABNORMAL LOW (ref >90)
GFR, EST NON AFRICAN AMERICAN: 14 mL/min — AB (ref >90)
GLUCOSE: 108 mg/dL — AB (ref 70–99)
Potassium: 3.5 mEq/L — ABNORMAL LOW (ref 3.7–5.3)
Sodium: 135 mEq/L — ABNORMAL LOW (ref 137–147)

## 2013-11-15 LAB — CBC
HCT: 27.2 % — ABNORMAL LOW (ref 36.0–46.0)
Hemoglobin: 9.1 g/dL — ABNORMAL LOW (ref 12.0–15.0)
MCH: 30 pg (ref 26.0–34.0)
MCHC: 33.5 g/dL (ref 30.0–36.0)
MCV: 89.8 fL (ref 78.0–100.0)
PLATELETS: 211 10*3/uL (ref 150–400)
RBC: 3.03 MIL/uL — ABNORMAL LOW (ref 3.87–5.11)
RDW: 13.2 % (ref 11.5–15.5)
WBC: 7.1 10*3/uL (ref 4.0–10.5)

## 2013-11-15 MED ORDER — BISOPROLOL FUMARATE 10 MG PO TABS: 10.0000 mg | ORAL_TABLET | Freq: Every day | ORAL | Status: DC

## 2013-11-15 MED ORDER — POTASSIUM CHLORIDE CRYS ER 20 MEQ PO TBCR
40.0000 meq | EXTENDED_RELEASE_TABLET | Freq: Once | ORAL | Status: AC
  Administered 2013-11-15: 40 meq via ORAL
  Filled 2013-11-15: qty 2

## 2013-11-15 MED ORDER — AMLODIPINE BESYLATE 10 MG PO TABS: 10.0000 mg | ORAL_TABLET | Freq: Every day | ORAL | Status: DC

## 2013-11-15 MED ORDER — PANTOPRAZOLE SODIUM 40 MG PO TBEC: 40.0000 mg | DELAYED_RELEASE_TABLET | Freq: Two times a day (BID) | ORAL | Status: DC

## 2013-11-15 NOTE — Discharge Instructions (Signed)
Please note that lasix was stopped Bisoprolol-HCTZ was changed to Bisoprolol only  Chronic Kidney Disease Chronic kidney disease occurs when the kidneys are damaged over a long period. The kidneys are two organs that lie on either side of the spine between the middle of the back and the front of the abdomen. The kidneys:   Remove wastes and extra water from the blood.   Produce important hormones. These help keep bones strong, regulate blood pressure, and help create red blood cells.   Balance the fluids and chemicals in the blood and tissues. A small amount of kidney damage may not cause problems, but a large amount of damage may make it difficult or impossible for the kidneys to work the way they should. If steps are not taken to slow down the kidney damage or stop it from getting worse, the kidneys may stop working permanently. Most of the time, chronic kidney disease does not go away. However, it can often be controlled, and those with the disease can usually live normal lives. CAUSES  The most common causes of chronic kidney disease are diabetes and high blood pressure (hypertension). Chronic kidney disease may also be caused by:   Diseases that cause kidneys' filters to become inflamed.   Diseases that affect the immune system.   Genetic diseases.   Medicines that damage the kidneys, such as anti-inflammatory medicines.  Poisoning or exposure to toxic substances.   A reoccurring kidney or urinary infection.   A problem with urine flow. This may be caused by:   Cancer.   Kidney stones.   An enlarged prostate in males. SYMPTOMS  Because the kidney damage in chronic kidney disease occurs slowly, symptoms develop slowly and may not be obvious until the kidney damage becomes severe. A person may have a kidney disease for years without showing any symptoms. Symptoms can include:   Swelling (edema) of the legs, ankles, or feet.   Tiredness (lethargy).   Nausea  or vomiting.   Confusion.   Problems with urination, such as:   Decreased urine production.   Frequent urination, especially at night.   Frequent accidents in children who are potty trained.   Muscle twitches and cramps.   Shortness of breath.  Weakness.   Persistent itchiness.   Loss of appetite.  Metallic taste in the mouth.  Trouble sleeping.  Slowed development in children.  Short stature in children. DIAGNOSIS  Chronic kidney disease may be detected and diagnosed by tests, including blood, urine, imaging, or kidney biopsy tests.  TREATMENT  Most chronic kidney diseases cannot be cured. Treatment usually involves relieving symptoms and preventing or slowing the progression of the disease. Treatment may include:   A special diet. You may need to avoid alcohol and foods thatare salty and high in potassium.   Medicines. These may:   Lower blood pressure.   Relieve anemia.   Relieve swelling.   Protect the bones. HOME CARE INSTRUCTIONS   Follow your prescribed diet.   Only take over-the-counter or prescription medicines as directed by your caregiver.  Do not take any new medicines (prescription, over-the-counter, or nutritional supplements) unless approved by your caregiver. Many medicines can worsen your kidney damage or need to have the dose adjusted.   Quit smoking if you are a smoker. Talk to your caregiver about a smoking cessation program.   Keep all follow-up appointments as directed by your caregiver. SEEK IMMEDIATE MEDICAL CARE IF:  Your symptoms get worse or you develop new symptoms.  You develop symptoms of end-stage kidney disease. These include:   Headaches.   Abnormally dark or light skin.   Numbness in the hands or feet.   Easy bruising.   Frequent hiccups.   Menstruation stops.   You have a fever.   You have decreased urine production.   You havepain or bleeding when urinating. MAKE SURE  YOU:  Understand these instructions.  Will watch your condition.  Will get help right away if you are not doing well or get worse. FOR MORE INFORMATION  American Association of Kidney Patients: BombTimer.gl National Kidney Foundation: www.kidney.[place]: https://mathis.com/ [facility]: www.lifeoptions.org and www.kidneyschool.org Document Released: 02/17/2008 Document Revised: 04/26/2012 Document Reviewed: 01/07/2012 [facility] Patient Information 2015 [place], [place]. This information is not intended to replace advice given to you by your health care provider. Make sure you discuss any questions you have with your health care provider.  Gastritis, Adult Gastritis is soreness and swelling (inflammation) of the lining of the stomach. Gastritis can develop as a sudden onset (acute) or long-term (chronic) condition. If gastritis is not treated, it can lead to stomach bleeding and ulcers. CAUSES  Gastritis occurs when the stomach lining is weak or damaged. Digestive juices from the stomach then inflame the weakened stomach lining. The stomach lining may be weak or damaged due to viral or bacterial infections. One common bacterial infection is the Helicobacter pylori infection. Gastritis can also result from excessive alcohol consumption, taking certain medicines, or having too much acid in the stomach.  SYMPTOMS  In some cases, there are no symptoms. When symptoms are present, they may include:  Pain or a burning sensation in the upper abdomen.  Nausea.  Vomiting.  An uncomfortable feeling of fullness after eating. DIAGNOSIS  Your caregiver may suspect you have gastritis based on your symptoms and a physical exam. To determine the cause of your gastritis, your caregiver may perform the following:  Blood or stool tests to check for the H pylori bacterium.  Gastroscopy. A thin, flexible tube (endoscope) is passed down the esophagus and into the stomach.  The endoscope has a light and camera on the end. Your caregiver uses the endoscope to view the inside of the stomach.  Taking a tissue sample (biopsy) from the stomach to examine under a microscope. TREATMENT  Depending on the cause of your gastritis, medicines may be prescribed. If you have a bacterial infection, such as an H pylori infection, antibiotics may be given. If your gastritis is caused by too much acid in the stomach, H2 blockers or antacids may be given. Your caregiver may recommend that you stop taking aspirin, ibuprofen, or other nonsteroidal anti-inflammatory drugs (NSAIDs). HOME CARE INSTRUCTIONS  Only take over-the-counter or prescription medicines as directed by your caregiver.  If you were given antibiotic medicines, take them as directed. Finish them even if you start to feel better.  Drink enough fluids to keep your urine clear or pale yellow.  Avoid foods and drinks that make your symptoms worse, such as:  Caffeine or alcoholic drinks.  Chocolate.  Peppermint or mint flavorings.  Garlic and onions.  Spicy foods.  Citrus fruits, such as oranges, lemons, or limes.  Tomato-based foods such as sauce, chili, salsa, and pizza.  Fried and fatty foods.  Eat small, frequent meals instead of large meals. SEEK IMMEDIATE MEDICAL CARE IF:   You have black or dark red stools.  You vomit blood or material that looks like coffee grounds.  You are unable  to keep fluids down.  Your abdominal pain gets worse.  You have a fever.  You do not feel better after 1 week.  You have any other questions or concerns. MAKE SURE YOU:  Understand these instructions.  Will watch your condition.  Will get help right away if you are not doing well or get worse. Document Released: 05/04/2001 Document Revised: 11/09/2011 Document Reviewed: 06/23/2011 [facility] Patient Information 2015 [place], [place]. This information is not intended to replace advice given to you by your  health care provider. Make sure you discuss any questions you have with your health care provider.

## 2013-11-15 NOTE — Discharge Summary (Signed)
Physician Discharge Summary  Rachael [name]:[id number] DOB: 1924/05/24 DOA: 11/11/2013  PCP: Alesia Richards, MD  Admit date: 11/11/2013 Discharge date: 11/15/2013  Recommendations for Outpatient Follow-up:  1. Pt will need to follow up with PCP in 2-3 weeks post discharge 2. Please obtain BMP to evaluate electrolytes and kidney function, potassium level 3. Pt was given one dose of K-Dur 40 MEQ PO prior to discharge  4. Please note that HCTZ and Lasix were discontinued until renal function improves  5. Please also check CBC to evaluate Hg and Hct levels  Discharge Diagnoses: Erosive gastritis  Active Problems:   TRANSIENT ISCHEMIC ATTACK   CKD (chronic kidney disease), stage III   Hypothyroidism   GI bleed   Hypertensive urgency   Protein-calorie malnutrition, severe   Erosive gastritis  Discharge Condition: Stable  Diet recommendation: Heart healthy diet discussed in details   Brief narrative:  80 year old female with CKD stage III - IV, HTN, diverticulosis, anemia of chronic diease, vit D deficiency presented to ED after noted to have significantly elevated BP at home but with no chest pain, shortness of breath, no headaches or visual changes. She has been taking medications as prescribed. She has noted black stool several days in duration but no frank blood in the stool. Patient has been taking aspirin 325 mg by mouth daily for past 2 years.   Assessment/Plan:  Hypertensive urgency  - Resolved and BP remains stable  - continue Catapres 0.2 mg po TID and Bisoprolol 10 mg po daily, amlodipine 5 mg daily  - stopped HCTZ and Lasix due to renal failure as noted below  Acute blood loss anemia, melena  - EGD showed erosive gastritis  - Likely due to aspirin use, gastric erosions versus ulcer  - continue protonix BID, continue EC aspirin as per Gi rec's CKD, stage III - IV - Cr stable over the past 24 hours - continue to hold HCTZ and Lasix until renal function  improves  History of double vision  - As per patient, she has these episodes while she puts on new prescription glasses.  - No recent episode of double vision, But she has a history of TIA in the past. CT head did not show any significant abnormality.  History of syncope  - No recent episodes of passing out, last episode was more than 6 weeks ago.  - 2-D echocardiogram showed grade 2 diastolic dysfunction and cardiac enzymes time 3 were negative.  - cardiology will set up Holter monitor as outpatient  Hypokalemia  - given one dose of K-dur piror to discharge so needs to have potassium level checked in an outpatient setting  Lower extremity edema  - Lasix on hold due to worsening Cr  Chronic diastolic CHF  - euvolemic this AM  - Cr up  - weight this AM is 129 lbs and stable and at pt's baseline  - lasix on hold as noted above  - pt advised to monitor weight closely and to report weight gain over 3 lbs to her PCP - no LE swelling this AM HLD  - continue statin  DVT prophylaxis  - SCDs  Hypothyroidism  - continue synthroid  Moderate malnutrition  - secondary to acute illness  - says she has no appetite  - encouraged PO intake   Code Status: DNR  Family Communication: Pt at bedside  Disposition Plan: D/C   Consultants:  None Procedures:  None Antibiotics:  None   Discharge Exam: Filed Vitals:   11/15/13  0513  BP: 158/43  Pulse: 60  Temp: 97.4 F (36.3 C)  Resp: 18   Filed Vitals:   11/14/13 1408 11/14/13 2142 11/15/13 0513 11/15/13 0605  BP: 139/80 160/51 158/43   Pulse: 61 68 60   Temp: 97.7 F (36.5 C) 97.6 F (36.4 C) 97.4 F (36.3 C)   TempSrc: Oral Oral Oral   Resp: 20 20 18    Height:      Weight:    58.9 kg (129 lb 13.6 oz)  SpO2: 100% 97% 98%     General: Pt is alert, follows commands appropriately, not in acute distress Cardiovascular: Regular rate and rhythm, no rubs, no gallops Respiratory: Clear to auscultation bilaterally, no wheezing, no  crackles, no rhonchi Abdominal: Soft, non tender, non distended, bowel sounds +, no guarding Extremities: no edema, no cyanosis, pulses palpable bilaterally DP and PT Neuro: Grossly nonfocal  Discharge Instructions  Discharge Instructions   Diet - low sodium heart healthy    Complete by:  As directed      Increase activity slowly    Complete by:  As directed             Medication List    STOP taking these medications       bisoprolol-hydrochlorothiazide 5-6.25 MG per tablet  Commonly known as:  ZIAC     furosemide 40 MG tablet  Commonly known as:  LASIX      TAKE these medications       allopurinol 100 MG tablet  Commonly known as:  ZYLOPRIM  Take 100 mg by mouth every morning.     amLODipine 10 MG tablet  Commonly known as:  NORVASC  Take 1 tablet (10 mg total) by mouth daily.     aspirin EC 325 MG tablet  Take 325 mg by mouth at bedtime.     atorvastatin 20 MG tablet  Commonly known as:  LIPITOR  Take 20 mg by mouth every evening.     Biotin 1000 MCG tablet  Take 1,000 mcg by mouth every morning.     bisoprolol 10 MG tablet  Commonly known as:  ZEBETA  Take 1 tablet (10 mg total) by mouth daily.     CALCIUM ANTACID 500 MG chewable tablet  Generic drug:  calcium carbonate  Chew 1 tablet by mouth 2 (two) times daily.     cholecalciferol 1000 UNITS tablet  Commonly known as:  VITAMIN D  Take 1,000 Units by mouth every morning.     cloNIDine 0.1 MG tablet  Commonly known as:  CATAPRES  Take 0.1 mg by mouth 3 (three) times daily.     levothyroxine 125 MCG tablet  Commonly known as:  SYNTHROID, LEVOTHROID  Take 125-187.5 mcg by mouth daily before breakfast. Take 1.5 tabs (187.63mcg) on Monday, Wednesday, and Friday. Take 1 tab (127mcg) on Sunday, Tuesday, Thursday, and Saturday.     multivitamin with minerals Tabs tablet  Take 1 tablet by mouth every morning.     pantoprazole 40 MG tablet  Commonly known as:  PROTONIX  Take 1 tablet (40 mg total) by  mouth 2 (two) times daily before a meal.           Follow-up Information   Follow up with Alesia Richards, MD On 11/20/2013. (appointment scheduled at 4:30 pm, patient needs to have BMP checked (follow up on Creatinine and potassium))    Specialty:  Internal Medicine   Contact information:   [address] [place]  [address] [phone]        The results of significant diagnostics from this hospitalization (including imaging, microbiology, ancillary and laboratory) are listed below for reference.     Microbiology: Recent Results (from the past 240 hour(s))  MRSA PCR SCREENING     Status: None   Collection Time    11/11/13  6:04 PM      Result Value Ref Range Status   MRSA by PCR NEGATIVE  NEGATIVE Final   Comment:            The GeneXpert MRSA Assay (FDA     approved for NASAL specimens     only), is one component of a     comprehensive MRSA colonization     surveillance program. It is not     intended to diagnose MRSA     infection nor to guide or     monitor treatment for     MRSA infections.     Labs: Basic Metabolic Panel:  Recent Labs Lab 11/08/13 1050 11/11/13 1400 11/11/13 1430 11/12/13 0104 11/14/13 0504 11/15/13 0417  NA 142 142 140 142 138 135*  K 3.6 3.2* 2.9* 3.8 3.8 3.5*  CL 101 95* 93* 101 99 97  CO2 30 32  --  26 24 24   GLUCOSE 159* 203* 183* 136* 108* 108*  BUN 43* 52* 47* 51* 56* 57*  CREATININE 1.70* 1.84* 2.00* 2.09* 2.80* 2.83*  CALCIUM 9.6 10.1  --  9.2 8.6 8.6   Liver Function Tests:  Recent Labs Lab 11/08/13 1050 11/11/13 1400 11/12/13 0104  AST 27 28 40*  ALT 24 25 34  ALKPHOS 77 85 82  BILITOT 0.6 0.6 0.6  PROT 5.8* 6.8 6.0  ALBUMIN 4.0 4.1 3.6   CBC:  Recent Labs Lab 11/08/13 1050 11/11/13 1400  11/12/13 0104  11/12/13 1856 11/13/13 0650 11/13/13 1518 11/14/13 0504 11/15/13 0417  WBC 5.7 7.6  --  9.4  --   --   --   --  8.5 7.1  NEUTROABS 4.3  --   --   --   --   --   --   --    --   --   HGB 11.1* 11.1*  < > 10.8*  < > 10.4* 10.0* 10.3* 9.4* 9.1*  HCT 33.3* 33.2*  < > 31.8*  < > 31.3* 30.1* 30.9* 27.0* 27.2*  MCV 92.1 89.2  --  89.6  --   --   --   --  89.7 89.8  PLT 181 268  --  203  --   --   --   --  199 211  < > = values in this interval not displayed. Cardiac Enzymes:  Recent Labs Lab 11/11/13 1850 11/12/13 0104 11/12/13 0700  TROPONINI <0.30 <0.30 <0.30   SIGNED: Time coordinating discharge: Over 30 minutes  Faye Ramsay, MD  [facility] 11/15/2013, 10:02 AM Pager [phone]  If 7PM-7AM, please contact night-coverage [email or web address] Password [email or web address]

## 2013-11-15 NOTE — Progress Notes (Signed)
Per pt, pt feels safe to d/c home with home health services. Pt's friends's made aware at time of d/c that home health PT would be coming to the house to work with pt. Pt stated that she has a walker and a wheelchair already at home that she feels comfortable using until she feels stronger. While pt lives at home by herself, she does have friends who are able to check in on her throughout the day. AVS reviewed, all pt's questions were answered to satisfaction.

## 2013-11-17 ENCOUNTER — Other Ambulatory Visit: Payer: Self-pay | Admitting: Cardiology

## 2013-11-19 ENCOUNTER — Telehealth: Payer: Self-pay | Admitting: *Deleted

## 2013-11-19 NOTE — Telephone Encounter (Signed)
[facility] nurse called and states patient's BP was 176/82 at their initial visit.  Nurse asked at what BP reading should they call and report.  Per Dr Melford Aase, call if systolic is above 629.

## 2013-11-20 ENCOUNTER — Ambulatory Visit (INDEPENDENT_AMBULATORY_CARE_PROVIDER_SITE_OTHER): Payer: Medicare Other | Admitting: Internal Medicine

## 2013-11-20 ENCOUNTER — Encounter: Payer: Self-pay | Admitting: Internal Medicine

## 2013-11-20 VITALS — BP 178/76 | HR 76 | Temp 99.0°F | Resp 16 | Ht 68.5 in | Wt 138.0 lb

## 2013-11-20 DIAGNOSIS — I10 Essential (primary) hypertension: Secondary | ICD-10-CM

## 2013-11-20 DIAGNOSIS — D649 Anemia, unspecified: Secondary | ICD-10-CM

## 2013-11-20 DIAGNOSIS — N184 Chronic kidney disease, stage 4 (severe): Secondary | ICD-10-CM

## 2013-11-20 DIAGNOSIS — Z79899 Other long term (current) drug therapy: Secondary | ICD-10-CM

## 2013-11-20 DIAGNOSIS — E860 Dehydration: Secondary | ICD-10-CM

## 2013-11-20 LAB — CBC WITH DIFFERENTIAL/PLATELET
BASOS ABS: 0.1 10*3/uL (ref 0.0–0.1)
Basophils Relative: 1 % (ref 0–1)
EOS PCT: 2 % (ref 0–5)
Eosinophils Absolute: 0.2 10*3/uL (ref 0.0–0.7)
HCT: 28.1 % — ABNORMAL LOW (ref 36.0–46.0)
Hemoglobin: 9.8 g/dL — ABNORMAL LOW (ref 12.0–15.0)
LYMPHS PCT: 19 % (ref 12–46)
Lymphs Abs: 1.6 10*3/uL (ref 0.7–4.0)
MCH: 29.8 pg (ref 26.0–34.0)
MCHC: 34.9 g/dL (ref 30.0–36.0)
MCV: 85.4 fL (ref 78.0–100.0)
Monocytes Absolute: 0.7 10*3/uL (ref 0.1–1.0)
Monocytes Relative: 8 % (ref 3–12)
NEUTROS ABS: 5.8 10*3/uL (ref 1.7–7.7)
Neutrophils Relative %: 70 % (ref 43–77)
Platelets: 355 10*3/uL (ref 150–400)
RBC: 3.29 MIL/uL — ABNORMAL LOW (ref 3.87–5.11)
RDW: 14.3 % (ref 11.5–15.5)
WBC: 8.3 10*3/uL (ref 4.0–10.5)

## 2013-11-20 NOTE — Patient Instructions (Signed)
Chronic Kidney Disease  Chronic kidney disease occurs when the kidneys are damaged over a long period. The kidneys are two organs that lie on either side of the spine between the middle of the back and the front of the abdomen. The kidneys:   · Remove wastes and extra water from the blood.    · Produce important hormones. These help keep bones strong, regulate blood pressure, and help create red blood cells.    · Balance the fluids and chemicals in the blood and tissues.  A small amount of kidney damage may not cause problems, but a large amount of damage may make it difficult or impossible for the kidneys to work the way they should. If steps are not taken to slow down the kidney damage or stop it from getting worse, the kidneys may stop working permanently. Most of the time, chronic kidney disease does not go away. However, it can often be controlled, and those with the disease can usually live normal lives.  CAUSES   The most common causes of chronic kidney disease are diabetes and high blood pressure (hypertension). Chronic kidney disease may also be caused by:   · Diseases that cause kidneys' filters to become inflamed.    · Diseases that affect the immune system.    · Genetic diseases.    · Medicines that damage the kidneys, such as anti-inflammatory medicines.    · Poisoning or exposure to toxic substances.    · A reoccurring kidney or urinary infection.    · A problem with urine flow. This may be caused by:    ¨ Cancer.    ¨ Kidney stones.    ¨ An enlarged prostate in males.  SYMPTOMS   Because the kidney damage in chronic kidney disease occurs slowly, symptoms develop slowly and may not be obvious until the kidney damage becomes severe. A person may have a kidney disease for years without showing any symptoms. Symptoms can include:   · Swelling (edema) of the legs, ankles, or feet.    · Tiredness (lethargy).    · Nausea or vomiting.    · Confusion.    · Problems with urination, such as:    ¨ Decreased urine  production.    ¨ Frequent urination, especially at night.    ¨ Frequent accidents in children who are potty trained.    · Muscle twitches and cramps.    · Shortness of breath.   · Weakness.    · Persistent itchiness.    · Loss of appetite.  · Metallic taste in the mouth.  · Trouble sleeping.  · Slowed development in children.  · Short stature in children.  DIAGNOSIS   Chronic kidney disease may be detected and diagnosed by tests, including blood, urine, imaging, or kidney biopsy tests.   TREATMENT   Most chronic kidney diseases cannot be cured. Treatment usually involves relieving symptoms and preventing or slowing the progression of the disease. Treatment may include:   · A special diet. You may need to avoid alcohol and foods that are salty and high in potassium.    · Medicines. These may:    ¨ Lower blood pressure.    ¨ Relieve anemia.    ¨ Relieve swelling.    ¨ Protect the bones.  HOME CARE INSTRUCTIONS   · Follow your prescribed diet.    · Only take over-the-counter or prescription medicines as directed by your caregiver.   · Do not take any new medicines (prescription, over-the-counter, or nutritional supplements) unless approved by your caregiver. Many medicines can worsen your kidney damage or need to have the dose adjusted.    · Quit smoking if you are a   smoker. Talk to your caregiver about a smoking cessation program.    · Keep all follow-up appointments as directed by your caregiver.  SEEK IMMEDIATE MEDICAL CARE IF:  · Your symptoms get worse or you develop new symptoms.    · You develop symptoms of end-stage kidney disease. These include:    ¨ Headaches.    ¨ Abnormally dark or light skin.    ¨ Numbness in the hands or feet.    ¨ Easy bruising.    ¨ Frequent hiccups.    ¨ Menstruation stops.    · You have a fever.    · You have decreased urine production.    · You have pain or bleeding when urinating.  MAKE SURE YOU:  · Understand these instructions.  · Will watch your condition.  · Will get help right  away if you are not doing well or get worse.  FOR MORE INFORMATION   American Association of Kidney Patients: www.aakp.org  National Kidney Foundation: www.kidney.org  American Kidney Fund: www.akfinc.org  [employer] [facility]: www.lifeoptions.org and www.kidneyschool.org  Document Released: 02/17/2008 Document Revised: 04/26/2012 Document Reviewed: 01/07/2012  ExitCare® Patient Information ©2015 [employer]. This information is not intended to replace advice given to you by your health care provider. Make sure you discuss any questions you have with your health care provider.

## 2013-11-20 NOTE — Progress Notes (Signed)
Subjective:    Patient ID: Rachael [name], female    DOB: 03/21/1924, 80 y.o.   MRN: [id number]  HPI Patient was recently hospitalized 621-25/2015 wit uncontrolled HTN and worsening CKD / Dehydration and was given IVF and BP meds adjusted with d/c'ing her diuretics and she's here today for f/u. BP is noted elevated today and confirmed. CV ros is neg except dependent edema is worse despite more leg elevation at home. Current Outpatient Prescriptions on File Prior to Visit  Medication Sig Dispense Refill  . allopurinol (ZYLOPRIM) 100 MG tablet Take 100 mg by mouth every morning.      Marland Kitchen amLODipine (NORVASC) 10 MG tablet Take 1 tablet (10 mg total) by mouth daily.  30 tablet  3  . aspirin EC 325 MG tablet Take 325 mg by mouth at bedtime.       Marland Kitchen atorvastatin (LIPITOR) 20 MG tablet TAKE 1 TABLET (20 MG TOTAL) BY MOUTH DAILY. -OFFICE VISIT NEEDED FOR REFILLS  15 tablet  0  . Biotin 1000 MCG tablet Take 1,000 mcg by mouth every morning.       . bisoprolol (ZEBETA) 10 MG tablet Take 1 tablet (10 mg total) by mouth daily.  30 tablet  3  . calcium carbonate (CALCIUM ANTACID) 500 MG chewable tablet Chew 1 tablet by mouth 2 (two) times daily.      . cholecalciferol (VITAMIN D) 1000 UNITS tablet Take 1,000 Units by mouth every morning.       . cloNIDine (CATAPRES) 0.1 MG tablet Take 0.1 mg by mouth 3 (three) times daily.      Marland Kitchen levothyroxine (SYNTHROID, LEVOTHROID) 125 MCG tablet Take 125-187.5 mcg by mouth daily before breakfast. Take 1.5 tabs (187.53mcg) on Monday, Wednesday, and Friday. Take 1 tab (120mcg) on Sunday, Tuesday, Thursday, and Saturday.      . Multiple Vitamin (MULTIVITAMIN WITH MINERALS) TABS Take 1 tablet by mouth every morning.       . pantoprazole (PROTONIX) 40 MG tablet Take 1 tablet (40 mg total) by mouth 2 (two) times daily before a meal.  60 tablet  3   No current facility-administered medications on file prior to visit.   Allergies  Allergen Reactions  . Aricept [Donepezil  Hcl]     Nausea and vomiting  . Prevacid [Lansoprazole]     GI upset  . Tylenol [Acetaminophen]     Tylenol #3 - Nausea   Past Medical History  Diagnosis Date  . Vitamin D deficiency   . Diverticulosis   . Atherosclerotic heart disease   . TIA (transient ischemic attack)   . CKD (chronic kidney disease)   . Inguinal hernia   . Broken hip     bilateral  . Complication of anesthesia     passed out after cataract surgery  . Hypertension   . Hyperlipidemia   . Hypothyroidism   . Senile osteoporosis   . Osteopenia   . Vitamin D deficiency   . Elevated hemoglobin A1c    Past Surgical History  Procedure Laterality Date  . Inguinal hernia repair    . Tonsillectomy    . Hip arthroplasty  04/01/2012    Procedure: ARTHROPLASTY BIPOLAR HIP;  Surgeon: Kerin Salen, MD;  Location: [facility];  Service: Orthopedics;  Laterality: Left;  . Compression hip screw  05/06/2012    Procedure: COMPRESSION HIP;  Surgeon: Jolyn Nap, MD;  Location: [facility];  Service: Orthopedics;  Laterality: Right;  . Skin lesion excision  benignt; from shoulder  . Hip surgery      left  . Eye surgery      bilateral  . Eye surgery      tear duct R side  . Blepharoplasty  1983  . Melanoma excision  1991    Right Arm  . Total hip arthroplasty Right 2013  . Total hip arthroplasty Left 2013  . Esophagogastroduodenoscopy N/A 11/12/2013    Procedure: ESOPHAGOGASTRODUODENOSCOPY (EGD);  Surgeon: Jerene Bears, MD;  Location: [facility];  Service: Endoscopy;  Laterality: N/A;   Review of Systems In addition to the HPI above,  No Fever-chills,  No Headache, No changes with Vision or hearing,  No problems swallowing food or Liquids,  No Chest pain or productive Cough or Shortness of Breath,  No Abdominal pain, No Nausea or Vomitting, Bowel movements are regular,  No Blood in stool or Urine,  No dysuria,  No new skin rashes or bruises,  No new joints pains-aches,  No new weakness, tingling, numbness in  any extremity,  No recent weight loss,  No polyuria, polydypsia or polyphagia,  No significant Mental Stressors.  A full 10 point Review of Systems was done, except as stated above, all other Review of Systems were negative  Objective:   Physical Exam BP 178/76  P76  T99 F  Resp 16  Ht 5' 8.5"   Wt 138 lb   BMI 20.68 kg/m2  HEENT - Eac's patent. TM's Nl. EOM's full. PERRLA. NasoOroPharynx clear. Neck - supple. Nl Thyroid. Carotids 2+ & No bruits, nodes, JVD Chest - Clear equal BS w/o Rales, rhonchi, wheezes. Cor - Nl HS. RRR w/o sig MGR. PP 1(+).  And 2-3 (+) ankle/pretibial edema edema. Abd - No palpable organomegaly, masses or tenderness. BS nl. MS- FROM w/o deformities. Muscle power, tone and bulk Nl. Gait Nl. Neuro - No obvious Cr N abnormalities. Sensory, motor and Cerebellar functions appear Nl w/o focal abnormalities. Psyche - Mental status normal & appropriate.  No delusions, ideations or obvious mood abnormalities.  Assessment & Plan:   1. Hypertension, poorly comntrolled  2. CKD (chronic kidney disease) stage 4, GFR 15-29 ml/min - BASIC METABOLIC PANEL WITH GFR  3. Dehydration, Recent  4. Anemia, unspecified anemia type - CBC with Differential  -Further disposition pending labs as that meds will need to be added/adjusted and diuretics avoided. -ROV - 2 weeks

## 2013-11-20 NOTE — Progress Notes (Deleted)
Patient ID: Rachael [name], female   DOB: 02-05-1924, 80 y.o.   MRN: [id number]

## 2013-11-21 ENCOUNTER — Telehealth: Payer: Self-pay | Admitting: *Deleted

## 2013-11-21 LAB — BASIC METABOLIC PANEL WITH GFR
BUN: 52 mg/dL — ABNORMAL HIGH (ref 6–23)
CHLORIDE: 100 meq/L (ref 96–112)
CO2: 25 mEq/L (ref 19–32)
CREATININE: 2.18 mg/dL — AB (ref 0.50–1.10)
Calcium: 9.1 mg/dL (ref 8.4–10.5)
GFR, Est African American: 23 mL/min — ABNORMAL LOW
GFR, Est Non African American: 20 mL/min — ABNORMAL LOW
Glucose, Bld: 121 mg/dL — ABNORMAL HIGH (ref 70–99)
Potassium: 4.2 mEq/L (ref 3.5–5.3)
Sodium: 136 mEq/L (ref 135–145)

## 2013-11-21 NOTE — Telephone Encounter (Signed)
Rachael [name] from [facility] called regarding physical therapy 2 times a week for 6 weeks for transfer, balance and gait training.  OK per Dr Melford Aase.  Left message to inform [facility]

## 2013-11-21 NOTE — Telephone Encounter (Signed)
Called patient regarding BP med.  Per Dr Melford Aase, patient can increase Catapress 0.1 mg to 2 tabs tid if BP is above 540 systolic.  Patient states she is having headaches. Per Dr Melford Aase, she can take Tylenolol and headache may get better when BP is in a better range.

## 2013-11-29 ENCOUNTER — Inpatient Hospital Stay (HOSPITAL_COMMUNITY)
Admission: EM | Admit: 2013-11-29 | Discharge: 2013-12-02 | DRG: 293 | Disposition: A | Discharge status: Home Health | Payer: Medicare Other | Attending: Internal Medicine | Admitting: Internal Medicine

## 2013-11-29 ENCOUNTER — Encounter (HOSPITAL_COMMUNITY): Payer: Self-pay | Admitting: Emergency Medicine

## 2013-11-29 ENCOUNTER — Telehealth: Payer: Self-pay | Admitting: *Deleted

## 2013-11-29 ENCOUNTER — Ambulatory Visit: Payer: Self-pay | Admitting: Physician Assistant

## 2013-11-29 ENCOUNTER — Emergency Department (HOSPITAL_COMMUNITY): Payer: Medicare Other

## 2013-11-29 DIAGNOSIS — Z96649 Presence of unspecified artificial hip joint: Secondary | ICD-10-CM

## 2013-11-29 DIAGNOSIS — G459 Transient cerebral ischemic attack, unspecified: Secondary | ICD-10-CM

## 2013-11-29 DIAGNOSIS — Z823 Family history of stroke: Secondary | ICD-10-CM

## 2013-11-29 DIAGNOSIS — M810 Age-related osteoporosis without current pathological fracture: Secondary | ICD-10-CM | POA: Diagnosis present

## 2013-11-29 DIAGNOSIS — I5032 Chronic diastolic (congestive) heart failure: Secondary | ICD-10-CM

## 2013-11-29 DIAGNOSIS — Z87891 Personal history of nicotine dependence: Secondary | ICD-10-CM

## 2013-11-29 DIAGNOSIS — Z79899 Other long term (current) drug therapy: Secondary | ICD-10-CM

## 2013-11-29 DIAGNOSIS — I5033 Acute on chronic diastolic (congestive) heart failure: Principal | ICD-10-CM | POA: Diagnosis present

## 2013-11-29 DIAGNOSIS — Z66 Do not resuscitate: Secondary | ICD-10-CM | POA: Diagnosis present

## 2013-11-29 DIAGNOSIS — Z8673 Personal history of transient ischemic attack (TIA), and cerebral infarction without residual deficits: Secondary | ICD-10-CM

## 2013-11-29 DIAGNOSIS — I498 Other specified cardiac arrhythmias: Secondary | ICD-10-CM

## 2013-11-29 DIAGNOSIS — I252 Old myocardial infarction: Secondary | ICD-10-CM

## 2013-11-29 DIAGNOSIS — I129 Hypertensive chronic kidney disease with stage 1 through stage 4 chronic kidney disease, or unspecified chronic kidney disease: Secondary | ICD-10-CM | POA: Diagnosis present

## 2013-11-29 DIAGNOSIS — D649 Anemia, unspecified: Secondary | ICD-10-CM | POA: Diagnosis present

## 2013-11-29 DIAGNOSIS — I509 Heart failure, unspecified: Secondary | ICD-10-CM

## 2013-11-29 DIAGNOSIS — K2960 Other gastritis without bleeding: Secondary | ICD-10-CM

## 2013-11-29 DIAGNOSIS — I150 Renovascular hypertension: Secondary | ICD-10-CM

## 2013-11-29 DIAGNOSIS — E43 Unspecified severe protein-calorie malnutrition: Secondary | ICD-10-CM

## 2013-11-29 DIAGNOSIS — E039 Hypothyroidism, unspecified: Secondary | ICD-10-CM | POA: Diagnosis present

## 2013-11-29 DIAGNOSIS — I2510 Atherosclerotic heart disease of native coronary artery without angina pectoris: Secondary | ICD-10-CM | POA: Diagnosis present

## 2013-11-29 DIAGNOSIS — E785 Hyperlipidemia, unspecified: Secondary | ICD-10-CM | POA: Diagnosis present

## 2013-11-29 DIAGNOSIS — I10 Essential (primary) hypertension: Secondary | ICD-10-CM

## 2013-11-29 DIAGNOSIS — N183 Chronic kidney disease, stage 3 unspecified: Secondary | ICD-10-CM | POA: Diagnosis present

## 2013-11-29 DIAGNOSIS — E559 Vitamin D deficiency, unspecified: Secondary | ICD-10-CM

## 2013-11-29 DIAGNOSIS — K5730 Diverticulosis of large intestine without perforation or abscess without bleeding: Secondary | ICD-10-CM

## 2013-11-29 DIAGNOSIS — R7309 Other abnormal glucose: Secondary | ICD-10-CM

## 2013-11-29 LAB — CBC
HEMATOCRIT: 29.7 % — AB (ref 36.0–46.0)
HEMOGLOBIN: 10.1 g/dL — AB (ref 12.0–15.0)
MCH: 29.6 pg (ref 26.0–34.0)
MCHC: 34 g/dL (ref 30.0–36.0)
MCV: 87.1 fL (ref 78.0–100.0)
Platelets: 265 10*3/uL (ref 150–400)
RBC: 3.41 MIL/uL — ABNORMAL LOW (ref 3.87–5.11)
RDW: 14.1 % (ref 11.5–15.5)
WBC: 9.5 10*3/uL (ref 4.0–10.5)

## 2013-11-29 LAB — PRO B NATRIURETIC PEPTIDE: Pro B Natriuretic peptide (BNP): 4314 pg/mL — ABNORMAL HIGH (ref 0–450)

## 2013-11-29 LAB — BASIC METABOLIC PANEL
Anion gap: 16 — ABNORMAL HIGH (ref 5–15)
BUN: 44 mg/dL — ABNORMAL HIGH (ref 6–23)
CO2: 21 mEq/L (ref 19–32)
CREATININE: 1.86 mg/dL — AB (ref 0.50–1.10)
Calcium: 9.4 mg/dL (ref 8.4–10.5)
Chloride: 99 mEq/L (ref 96–112)
GFR, EST AFRICAN AMERICAN: 27 mL/min — AB (ref >90)
GFR, EST NON AFRICAN AMERICAN: 23 mL/min — AB (ref >90)
GLUCOSE: 102 mg/dL — AB (ref 70–99)
Potassium: 4.5 mEq/L (ref 3.7–5.3)
Sodium: 136 mEq/L — ABNORMAL LOW (ref 137–147)

## 2013-11-29 LAB — I-STAT TROPONIN, ED: TROPONIN I, POC: 0.01 ng/mL (ref 0.00–0.08)

## 2013-11-29 LAB — TROPONIN I: Troponin I: <0.3 ng/mL (ref <0.30)

## 2013-11-29 MED ORDER — AMLODIPINE BESYLATE 10 MG PO TABS
10.0000 mg | ORAL_TABLET | Freq: Every day | ORAL | Status: DC
  Administered 2013-11-29 – 2013-12-02 (×4): 10 mg via ORAL
  Filled 2013-11-29 (×4): qty 1

## 2013-11-29 MED ORDER — FUROSEMIDE 10 MG/ML IJ SOLN
40.0000 mg | Freq: Once | INTRAMUSCULAR | Status: AC
  Administered 2013-11-29: 40 mg via INTRAVENOUS
  Filled 2013-11-29: qty 4

## 2013-11-29 MED ORDER — ATORVASTATIN CALCIUM 20 MG PO TABS
20.0000 mg | ORAL_TABLET | Freq: Every day | ORAL | Status: DC
  Administered 2013-11-30 – 2013-12-02 (×3): 20 mg via ORAL
  Filled 2013-11-29 (×4): qty 1

## 2013-11-29 MED ORDER — ASPIRIN EC 325 MG PO TBEC
325.0000 mg | DELAYED_RELEASE_TABLET | Freq: Every day | ORAL | Status: DC
  Administered 2013-11-29 – 2013-12-01 (×3): 325 mg via ORAL
  Filled 2013-11-29 (×4): qty 1

## 2013-11-29 MED ORDER — ONDANSETRON HCL 4 MG PO TABS: 4.0000 mg | ORAL_TABLET | Freq: Four times a day (QID) | ORAL | Status: DC | PRN

## 2013-11-29 MED ORDER — BISOPROLOL FUMARATE 10 MG PO TABS
10.0000 mg | ORAL_TABLET | Freq: Every day | ORAL | Status: DC
  Administered 2013-11-29 – 2013-12-02 (×4): 10 mg via ORAL
  Filled 2013-11-29 (×4): qty 1

## 2013-11-29 MED ORDER — SODIUM CHLORIDE 0.9 % IJ SOLN
3.0000 mL | Freq: Two times a day (BID) | INTRAMUSCULAR | Status: DC
  Administered 2013-11-30 – 2013-12-02 (×3): 3 mL via INTRAVENOUS

## 2013-11-29 MED ORDER — CALCIUM CARBONATE ANTACID 500 MG PO CHEW: 1.0000 | CHEWABLE_TABLET | Freq: Two times a day (BID) | ORAL | Status: DC

## 2013-11-29 MED ORDER — SODIUM CHLORIDE 0.9 % IJ SOLN
3.0000 mL | Freq: Two times a day (BID) | INTRAMUSCULAR | Status: DC
  Administered 2013-11-29 – 2013-12-01 (×4): 3 mL via INTRAVENOUS

## 2013-11-29 MED ORDER — LEVOTHYROXINE SODIUM 125 MCG PO TABS
187.5000 ug | ORAL_TABLET | ORAL | Status: DC
  Administered 2013-11-30: 187.5 ug via ORAL
  Filled 2013-11-29: qty 1.5

## 2013-11-29 MED ORDER — LEVOTHYROXINE SODIUM 125 MCG PO TABS
125.0000 ug | ORAL_TABLET | ORAL | Status: DC
  Filled 2013-11-29: qty 1

## 2013-11-29 MED ORDER — FUROSEMIDE 10 MG/ML IJ SOLN
40.0000 mg | Freq: Two times a day (BID) | INTRAMUSCULAR | Status: DC
  Administered 2013-11-30: 40 mg via INTRAVENOUS
  Filled 2013-11-29 (×3): qty 4

## 2013-11-29 MED ORDER — POLYVINYL ALCOHOL 1.4 % OP SOLN
1.0000 [drp] | OPHTHALMIC | Status: DC | PRN
  Filled 2013-11-29: qty 15

## 2013-11-29 MED ORDER — PROPYLENE GLYCOL 0.6 % OP SOLN: 1.0000 [drp] | Freq: Every morning | OPHTHALMIC | Status: DC

## 2013-11-29 MED ORDER — CLONIDINE HCL 0.1 MG PO TABS
0.1000 mg | ORAL_TABLET | Freq: Three times a day (TID) | ORAL | Status: DC
  Administered 2013-11-29 – 2013-12-02 (×8): 0.1 mg via ORAL
  Filled 2013-11-29 (×10): qty 1

## 2013-11-29 MED ORDER — ZOLPIDEM TARTRATE 5 MG PO TABS: 5.0000 mg | ORAL_TABLET | Freq: Every evening | ORAL | Status: DC | PRN

## 2013-11-29 MED ORDER — SODIUM CHLORIDE 0.9 % IV SOLN: 250.0000 mL | INTRAVENOUS | Status: DC | PRN

## 2013-11-29 MED ORDER — SODIUM CHLORIDE 0.9 % IJ SOLN: 3.0000 mL | INTRAMUSCULAR | Status: DC | PRN

## 2013-11-29 MED ORDER — BISACODYL 5 MG PO TBEC
5.0000 mg | DELAYED_RELEASE_TABLET | Freq: Every day | ORAL | Status: DC | PRN
  Administered 2013-12-01: 5 mg via ORAL
  Filled 2013-11-29: qty 1

## 2013-11-29 MED ORDER — ALUM & MAG HYDROXIDE-SIMETH 200-200-20 MG/5ML PO SUSP: 30.0000 mL | Freq: Four times a day (QID) | ORAL | Status: DC | PRN

## 2013-11-29 MED ORDER — SENNOSIDES-DOCUSATE SODIUM 8.6-50 MG PO TABS: 1.0000 | ORAL_TABLET | Freq: Every evening | ORAL | Status: DC | PRN

## 2013-11-29 MED ORDER — ADULT MULTIVITAMIN W/MINERALS CH
1.0000 | ORAL_TABLET | Freq: Every day | ORAL | Status: DC
  Administered 2013-11-29 – 2013-12-02 (×4): 1 via ORAL
  Filled 2013-11-29 (×4): qty 1

## 2013-11-29 MED ORDER — ONDANSETRON HCL 4 MG/2ML IJ SOLN: 4.0000 mg | Freq: Four times a day (QID) | INTRAMUSCULAR | Status: DC | PRN

## 2013-11-29 MED ORDER — VITAMIN B-1 100 MG PO TABS
100.0000 mg | ORAL_TABLET | Freq: Every day | ORAL | Status: DC
  Administered 2013-11-29 – 2013-12-02 (×4): 100 mg via ORAL
  Filled 2013-11-29 (×4): qty 1

## 2013-11-29 MED ORDER — FOLIC ACID 1 MG PO TABS
1.0000 mg | ORAL_TABLET | Freq: Every day | ORAL | Status: DC
  Administered 2013-11-29 – 2013-12-02 (×4): 1 mg via ORAL
  Filled 2013-11-29 (×4): qty 1

## 2013-11-29 MED ORDER — CALCIUM CARBONATE ANTACID 500 MG PO CHEW
500.0000 mg | CHEWABLE_TABLET | Freq: Two times a day (BID) | ORAL | Status: DC
  Administered 2013-11-30 (×2): 500 mg via ORAL
  Filled 2013-11-29 (×6): qty 1

## 2013-11-29 MED ORDER — VITAMIN D3 25 MCG (1000 UNIT) PO TABS
1000.0000 [IU] | ORAL_TABLET | Freq: Every morning | ORAL | Status: DC
  Administered 2013-11-30 – 2013-12-02 (×3): 1000 [IU] via ORAL
  Filled 2013-11-29 (×3): qty 1

## 2013-11-29 MED ORDER — ALLOPURINOL 100 MG PO TABS
100.0000 mg | ORAL_TABLET | Freq: Every morning | ORAL | Status: DC
  Administered 2013-11-30 – 2013-12-02 (×3): 100 mg via ORAL
  Filled 2013-11-29 (×3): qty 1

## 2013-11-29 MED ORDER — DOCUSATE SODIUM 100 MG PO CAPS
100.0000 mg | ORAL_CAPSULE | Freq: Two times a day (BID) | ORAL | Status: DC
  Administered 2013-11-29 – 2013-12-02 (×6): 100 mg via ORAL
  Filled 2013-11-29 (×7): qty 1

## 2013-11-29 MED ORDER — PANTOPRAZOLE SODIUM 40 MG PO TBEC
40.0000 mg | DELAYED_RELEASE_TABLET | Freq: Two times a day (BID) | ORAL | Status: DC
  Administered 2013-11-30 – 2013-12-02 (×5): 40 mg via ORAL
  Filled 2013-11-29 (×7): qty 1

## 2013-11-29 MED ORDER — HEPARIN SODIUM (PORCINE) 5000 UNIT/ML IJ SOLN
5000.0000 [IU] | Freq: Three times a day (TID) | INTRAMUSCULAR | Status: DC
  Administered 2013-11-30 – 2013-12-02 (×6): 5000 [IU] via SUBCUTANEOUS
  Filled 2013-11-29 (×11): qty 1

## 2013-11-29 MED ORDER — BIOTIN 1000 MCG PO TABS: 1000.0000 ug | ORAL_TABLET | Freq: Every morning | ORAL | Status: DC

## 2013-11-29 MED ORDER — MAGNESIUM CITRATE PO SOLN: 1.0000 | Freq: Once | ORAL | Status: AC | PRN

## 2013-11-29 MED ORDER — POLYVINYL ALCOHOL 1.4 % OP SOLN: 1.0000 [drp] | OPHTHALMIC | Status: DC | PRN

## 2013-11-29 MED ORDER — NITROGLYCERIN 2 % TD OINT
1.0000 [in_us] | TOPICAL_OINTMENT | Freq: Four times a day (QID) | TRANSDERMAL | Status: DC
  Administered 2013-11-30 – 2013-12-02 (×10): 1 [in_us] via TOPICAL
  Filled 2013-11-29: qty 30

## 2013-11-29 NOTE — Progress Notes (Signed)
No return call from Dr. Humphrey Rolls. Fredirick Maudlin, NP text paged regarding pt request to be a DNR and BP now 187/57 . Awaiting return call. Will continue to monitor.

## 2013-11-29 NOTE — H&P (Signed)
[facility] History and Physical  Rachael [name]:[id number] DOB: April 03, 1924 DOA: 11/29/2013  Referring physician: Kary Kos, MD PCP: Rachael Richards, MD   Chief Complaint: Shortness of Breath  HPI: Rachael [name] is a 80 y.o. female with a history of diastolic heart failure presents with increased edema of her legs. She has noted associated increased shortness of breath. She states that she recently was told to stop taking her lasix. Patient states since that time her swelling has increased. Patient states that she has no fevers or chills. She has noted that she is not able to lay completely flat. Patient has no CP noted. She denies having any palpitations. By her chart it appears that her last echo was done in June. Patient also does have a history of an old MI at some point although she does not appear to be aware of these diagnosis.   Review of Systems:  Constitutional:  No weight loss, night sweats, Fevers, chills, fatigue.  HEENT:  No headaches Cardio-vascular:  No chest pain, ++Orthopnea, ++PND, ++swelling in lower extremities, no dizziness, palpitations  GI:  No heartburn, indigestion, abdominal pain, nausea, vomiting, diarrhea  Resp:  No shortness of breath with exertion or at rest. No excess mucus, no productive cough, No non-productive cough, No coughing up of blood Skin:  no rash or lesions.  GU:  no dysuria, change in color of urine, no urgency Musculoskeletal:  No joint pain or swelling.  Psych:  No change in mood or affect. ++memory loss.   Past Medical History  Diagnosis Date  . Vitamin D deficiency   . Diverticulosis   . Atherosclerotic heart disease   . TIA (transient ischemic attack)   . CKD (chronic kidney disease)   . Inguinal hernia   . Broken hip     bilateral  . Complication of anesthesia     passed out after cataract surgery  . Hypertension   . Hyperlipidemia   . Hypothyroidism   . Senile osteoporosis   . Osteopenia     . Vitamin D deficiency   . Elevated hemoglobin A1c    Past Surgical History  Procedure Laterality Date  . Inguinal hernia repair    . Tonsillectomy    . Hip arthroplasty  04/01/2012    Procedure: ARTHROPLASTY BIPOLAR HIP;  Surgeon: Kerin Salen, MD;  Location: [facility];  Service: Orthopedics;  Laterality: Left;  . Compression hip screw  05/06/2012    Procedure: COMPRESSION HIP;  Surgeon: Jolyn Nap, MD;  Location: [place];  Service: Orthopedics;  Laterality: Right;  . Skin lesion excision      benignt; from shoulder  . Hip surgery      left  . Eye surgery      bilateral  . Eye surgery      tear duct R side  . Blepharoplasty  1983  . Melanoma excision  1991    Right Arm  . Total hip arthroplasty Right 2013  . Total hip arthroplasty Left 2013  . Esophagogastroduodenoscopy N/A 11/12/2013    Procedure: ESOPHAGOGASTRODUODENOSCOPY (EGD);  Surgeon: Jerene Bears, MD;  Location: [facility];  Service: Endoscopy;  Laterality: N/A;   Social History:  reports that she quit smoking about 42 years ago. Her smoking use included Cigarettes. She smoked 0.00 packs per day. She has never used smokeless tobacco. She reports that she does not drink alcohol or use illicit drugs.  Allergies  Allergen Reactions  . Aricept [Donepezil Hcl]  Nausea and vomiting  . Prevacid [Lansoprazole]     GI upset  . Tylenol [Acetaminophen]     Tylenol #3 - Nausea    Family History  Problem Relation Age of Onset  . CVA Mother     died age 19  . Emphysema Sister   . Colon cancer Neg Hx      Prior to Admission medications   Medication Sig Start Date End Date Taking? Authorizing Provider  allopurinol (ZYLOPRIM) 100 MG tablet Take 100 mg by mouth every morning.   Yes Historical Provider, MD  amLODipine (NORVASC) 10 MG tablet Take 1 tablet (10 mg total) by mouth daily. 11/15/13  Yes Theodis Blaze, MD  aspirin EC 325 MG tablet Take 325 mg by mouth at bedtime.    Yes Historical Provider, MD  atorvastatin  (LIPITOR) 20 MG tablet Take 20 mg by mouth daily.   Yes Historical Provider, MD  Biotin 1000 MCG tablet Take 1,000 mcg by mouth every morning.    Yes Historical Provider, MD  bisoprolol (ZEBETA) 10 MG tablet Take 1 tablet (10 mg total) by mouth daily. 11/15/13  Yes Theodis Blaze, MD  calcium carbonate (CALCIUM ANTACID) 500 MG chewable tablet Chew 1 tablet by mouth 2 (two) times daily.   Yes Historical Provider, MD  cholecalciferol (VITAMIN D) 1000 UNITS tablet Take 1,000 Units by mouth every morning.    Yes Historical Provider, MD  cloNIDine (CATAPRES) 0.1 MG tablet Take 0.1 mg by mouth 3 (three) times daily.   Yes Historical Provider, MD  levothyroxine (SYNTHROID, LEVOTHROID) 125 MCG tablet Take 125-187.5 mcg by mouth daily before breakfast. Take 1.5 tabs (187.39mcg) on Monday, Wednesday, and Friday. Take 1 tab (196mcg) on Sunday, Tuesday, Thursday, and Saturday.   Yes Historical Provider, MD  Multiple Vitamin (MULTIVITAMIN WITH MINERALS) TABS Take 1 tablet by mouth every morning.    Yes Historical Provider, MD  pantoprazole (PROTONIX) 40 MG tablet Take 1 tablet (40 mg total) by mouth 2 (two) times daily before a meal. 11/15/13  Yes Theodis Blaze, MD  polyvinyl alcohol (LIQUIFILM TEARS) 1.4 % ophthalmic solution Place 1-2 drops into both eyes as needed for dry eyes.   Yes Historical Provider, MD  Propylene Glycol (SYSTANE BALANCE OP) Apply 1-2 drops to eye 3 (three) times daily.   Yes Historical Provider, MD  sodium chloride (MURO 128) 5 % ophthalmic solution Place 1 drop into both eyes at bedtime.   Yes Historical Provider, MD   Physical Exam: Filed Vitals:   11/29/13 1925  BP: 194/85  Pulse: 70  Temp:   Resp: 20    BP 194/85  Pulse 70  Temp(Src) 98.6 F (37 C)  Resp 20  SpO2 96%  General:  Appears calm and comfortable Eyes: PERRL, normal lids, irises & conjunctiva ENT: grossly normal hearing, lips & tongue Neck: no LAD, masses or thyromegaly Cardiovascular: RRR, ++murmur ++LE  edema Respiratory: CTA bilaterally, no w/r/r. Normal respiratory effort. Abdomen: soft, ntnd Skin: no rash or induration seen on limited exam Musculoskeletal: grossly normal tone BUE/BLE Psychiatric: grossly normal mood and affect, speech fluent and appropriate Neurologic: grossly non-focal.          Labs on Admission:  Basic Metabolic Panel:  Recent Labs Lab 11/29/13 1715  NA 136*  K 4.5  CL 99  CO2 21  GLUCOSE 102*  BUN 44*  CREATININE 1.86*  CALCIUM 9.4   Liver Function Tests: No results found for this basename: AST, ALT, ALKPHOS, BILITOT, PROT, ALBUMIN,  in the last 168 hours No results found for this basename: LIPASE, AMYLASE,  in the last 168 hours No results found for this basename: AMMONIA,  in the last 168 hours CBC:  Recent Labs Lab 11/29/13 1715  WBC 9.5  HGB 10.1*  HCT 29.7*  MCV 87.1  PLT 265   Cardiac Enzymes: No results found for this basename: CKTOTAL, CKMB, CKMBINDEX, TROPONINI,  in the last 168 hours  BNP (last 3 results)  Recent Labs  11/29/13 1719  PROBNP 4314.0*   CBG: No results found for this basename: GLUCAP,  in the last 168 hours  Radiological Exams on Admission: Dg Chest 2 View  11/29/2013   CLINICAL DATA:  Short of breath.  Dry cough.  EXAM: CHEST  2 VIEW  COMPARISON:  05/05/2012 and 01/03/2010.  FINDINGS: There are small to moderate bilateral pleural effusions with associated lung base opacity, new from the prior study. Lung base opacity is likely atelectasis. Pneumonia is possible but felt less likely.  Lungs are hyperexpanded. Mild scarring is noted at the apices. No pulmonary edema.  Cardiac silhouette is normal in size. Normal mediastinal contour. No hilar masses.  Bony thorax is diffusely demineralized but grossly intact.  IMPRESSION: 1. Small moderate bilateral pleural effusions with associated lung base opacity. Lung base opacity is likely atelectasis. Pneumonia is possible. No pulmonary edema.   Electronically Signed   By:  Lajean Manes M.D.   On: 11/29/2013 18:11    EKG: Independently reviewed. NSR with old infarct  Assessment/Plan Active Problems:   Chronic diastolic CHF (congestive heart failure)   Hyperlipidemia   Hypertension   Hypothyroidism   CHF (congestive heart failure)   1. Acute congestive heart failure -admit to telemtry -will start on lasix IV -will get an echo in am -inadequate blood pressure control currently and will optimize -add nitropaste to chest wall  2. Hyperlipidemia -continue with home medications -will check lipid panel  3. Hypertension (uncontrolled) -will monitor -continue with home medications -added nitropaste  4. Hypothyroidism -check TSH -continue with thyroid replacement  5. Anemia -will check iron studies    Code Status: Full Code (must indicate code status--if unknown or must be presumed, indicate so) Family Communication: Sister (indicate person spoken with, if applicable, with phone number if by telephone) Disposition Plan: Home (indicate anticipated LOS)  Time spent: 48min  Revin Corker A [facility] Pager [phone]  **Disclaimer: This note may have been dictated with voice recognition software. Similar sounding words can inadvertently be transcribed and this note may contain transcription errors which may not have been corrected upon publication of note.**

## 2013-11-29 NOTE — Progress Notes (Signed)
Fredirick Maudlin, NP returned page.New orders placed in Epic.Will continue to monitor.

## 2013-11-29 NOTE — ED Notes (Signed)
Patient c/o bilateral leg swelling and SOB x 8 days. Patient was taken off Lasix on 12/19/13.

## 2013-11-29 NOTE — Progress Notes (Signed)
PHARMACIST - PHYSICIAN ORDER COMMUNICATION  CONCERNING: P&T Medication Policy on Herbal Medications  DESCRIPTION:  This patient's order for:  Biotin  has been noted.  This product(s) is classified as an "herbal" or natural product. Due to a lack of definitive safety studies or FDA approval, nonstandard manufacturing practices, plus the potential risk of unknown drug-drug interactions while on inpatient medications, the Pharmacy and Therapeutics Committee does not permit the use of "herbal" or natural products of this type within [facility].   ACTION TAKEN: The pharmacy department is unable to verify this order at this time and your patient has been informed of this safety policy. Please reevaluate patient's clinical condition at discharge and address if the herbal or natural product(s) should be resumed at that time.  Peggyann Juba, PharmD, BCPS 11/29/2013 9:08 PM Pharmacy: [phone]

## 2013-11-29 NOTE — ED Provider Notes (Signed)
CSN: [id number]     Arrival date & time 11/29/13  1602 History   First MD Initiated Contact with Patient 11/29/13 1640     Chief Complaint  Patient presents with  . Shortness of Breath  . Leg Swelling     (Consider location/radiation/quality/duration/timing/severity/associated sxs/prior Treatment) Patient is a 80 y.o. female presenting with shortness of breath. The history is provided by the patient.  Shortness of Breath Severity:  Mild Onset quality:  Gradual Duration:  7 days Timing:  Constant Progression:  Worsening Chronicity:  New Context comment:  Recent hospitalization with hold of her diuretics due to AoCKD Relieved by:  Nothing Worsened by:  Nothing tried Associated symptoms: no abdominal pain, no chest pain, no cough, no fever, no PND and no vomiting     Past Medical History  Diagnosis Date  . Vitamin D deficiency   . Diverticulosis   . Atherosclerotic heart disease   . TIA (transient ischemic attack)   . CKD (chronic kidney disease)   . Inguinal hernia   . Broken hip     bilateral  . Complication of anesthesia     passed out after cataract surgery  . Hypertension   . Hyperlipidemia   . Hypothyroidism   . Senile osteoporosis   . Osteopenia   . Vitamin D deficiency   . Elevated hemoglobin A1c    Past Surgical History  Procedure Laterality Date  . Inguinal hernia repair    . Tonsillectomy    . Hip arthroplasty  04/01/2012    Procedure: ARTHROPLASTY BIPOLAR HIP;  Surgeon: Kerin Salen, MD;  Location: [facility];  Service: Orthopedics;  Laterality: Left;  . Compression hip screw  05/06/2012    Procedure: COMPRESSION HIP;  Surgeon: Jolyn Nap, MD;  Location: [place];  Service: Orthopedics;  Laterality: Right;  . Skin lesion excision      benignt; from shoulder  . Hip surgery      left  . Eye surgery      bilateral  . Eye surgery      tear duct R side  . Blepharoplasty  1983  . Melanoma excision  1991    Right Arm  . Total hip arthroplasty Right 2013   . Total hip arthroplasty Left 2013  . Esophagogastroduodenoscopy N/A 11/12/2013    Procedure: ESOPHAGOGASTRODUODENOSCOPY (EGD);  Surgeon: Jerene Bears, MD;  Location: [facility];  Service: Endoscopy;  Laterality: N/A;   Family History  Problem Relation Age of Onset  . CVA Mother     died age 2  . Emphysema Sister   . Colon cancer Neg Hx    History  Substance Use Topics  . Smoking status: Former Smoker    Types: Cigarettes    Quit date: 05/06/1971  . Smokeless tobacco: Never Used  . Alcohol Use: No     Comment: none since Nov 2013-was on 2 beers/night   OB History   Grav Para Term Preterm Abortions TAB SAB Ect Mult Living                 Review of Systems  Constitutional: Negative for fever.  Respiratory: Positive for shortness of breath. Negative for cough.   Cardiovascular: Positive for leg swelling. Negative for chest pain and PND.  Gastrointestinal: Negative for vomiting and abdominal pain.  All other systems reviewed and are negative.     Allergies  Aricept; Prevacid; and Tylenol  Home Medications   Prior to Admission medications   Medication Sig Start Date  End Date Taking? Authorizing Provider  allopurinol (ZYLOPRIM) 100 MG tablet Take 100 mg by mouth every morning.    Historical Provider, MD  amLODipine (NORVASC) 10 MG tablet Take 1 tablet (10 mg total) by mouth daily. 11/15/13   Theodis Blaze, MD  aspirin EC 325 MG tablet Take 325 mg by mouth at bedtime.     Historical Provider, MD  atorvastatin (LIPITOR) 20 MG tablet TAKE 1 TABLET (20 MG TOTAL) BY MOUTH DAILY. -OFFICE VISIT NEEDED FOR REFILLS    Larey Dresser, MD  Biotin 1000 MCG tablet Take 1,000 mcg by mouth every morning.     Historical Provider, MD  bisoprolol (ZEBETA) 10 MG tablet Take 1 tablet (10 mg total) by mouth daily. 11/15/13   Theodis Blaze, MD  calcium carbonate (CALCIUM ANTACID) 500 MG chewable tablet Chew 1 tablet by mouth 2 (two) times daily.    Historical Provider, MD  cholecalciferol  (VITAMIN D) 1000 UNITS tablet Take 1,000 Units by mouth every morning.     Historical Provider, MD  cloNIDine (CATAPRES) 0.1 MG tablet Take 0.1 mg by mouth 3 (three) times daily.    Historical Provider, MD  levothyroxine (SYNTHROID, LEVOTHROID) 125 MCG tablet Take 125-187.5 mcg by mouth daily before breakfast. Take 1.5 tabs (187.16mcg) on Monday, Wednesday, and Friday. Take 1 tab (112mcg) on Sunday, Tuesday, Thursday, and Saturday.    Historical Provider, MD  Multiple Vitamin (MULTIVITAMIN WITH MINERALS) TABS Take 1 tablet by mouth every morning.     Historical Provider, MD  pantoprazole (PROTONIX) 40 MG tablet Take 1 tablet (40 mg total) by mouth 2 (two) times daily before a meal. 11/15/13   Theodis Blaze, MD   There were no vitals taken for this visit. Physical Exam  Nursing note and vitals reviewed. Constitutional: She is oriented to person, place, and time. She appears well-developed and well-nourished. No distress.  HENT:  Head: Normocephalic and atraumatic.  Mouth/Throat: Oropharynx is clear and moist.  Eyes: EOM are normal. Pupils are equal, round, and reactive to light.  Neck: Normal range of motion. Neck supple. JVD present.  Cardiovascular: Normal rate and regular rhythm.  Exam reveals no friction rub.   No murmur heard. Pulmonary/Chest: Effort normal and breath sounds normal. No respiratory distress. She has no wheezes. She has no rales.  Abdominal: Soft. She exhibits no distension. There is no tenderness. There is no rebound.  Musculoskeletal: Normal range of motion. She exhibits edema (2+ bilaterally).  Neurological: She is alert and oriented to person, place, and time.  Skin: She is not diaphoretic.    ED Course  Procedures (including critical care time) Labs Review Labs Reviewed  CBC - Abnormal; Notable for the following:    RBC 3.41 (*)    Hemoglobin 10.1 (*)    HCT 29.7 (*)    All other components within normal limits  BASIC METABOLIC PANEL - Abnormal; Notable for the  following:    Sodium 136 (*)    Glucose, Bld 102 (*)    BUN 44 (*)    Creatinine, Ser 1.86 (*)    GFR calc non Af Amer 23 (*)    GFR calc Af Amer 27 (*)    Anion gap 16 (*)    All other components within normal limits  PRO B NATRIURETIC PEPTIDE - Abnormal; Notable for the following:    Pro B Natriuretic peptide (BNP) 4314.0 (*)    All other components within normal limits  TROPONIN I  TSH  TROPONIN  I  TROPONIN I  HEMOGLOBIN A1C  CBC  COMPREHENSIVE METABOLIC PANEL  IRON AND TIBC  LIPID PANEL  TSH  I-STAT TROPOININ, ED    Imaging Review Dg Chest 2 View  11/29/2013   CLINICAL DATA:  Short of breath.  Dry cough.  EXAM: CHEST  2 VIEW  COMPARISON:  05/05/2012 and 01/03/2010.  FINDINGS: There are small to moderate bilateral pleural effusions with associated lung base opacity, new from the prior study. Lung base opacity is likely atelectasis. Pneumonia is possible but felt less likely.  Lungs are hyperexpanded. Mild scarring is noted at the apices. No pulmonary edema.  Cardiac silhouette is normal in size. Normal mediastinal contour. No hilar masses.  Bony thorax is diffusely demineralized but grossly intact.  IMPRESSION: 1. Small moderate bilateral pleural effusions with associated lung base opacity. Lung base opacity is likely atelectasis. Pneumonia is possible. No pulmonary edema.   Electronically Signed   By: Lajean Manes M.D.   On: 11/29/2013 18:11     EKG Interpretation   Date/Time:  Thursday November 29 2013 16:17:36 EDT Ventricular Rate:  63 PR Interval:  137 QRS Duration: 77 QT Interval:  437 QTC Calculation: 447 R Axis:   88 Text Interpretation:  Sinus rhythm Anterior infarct, old similar to prior  Confirmed by [facility]  MD, [place] ([id number]) on 11/29/2013 4:40:29 PM      MDM   Final diagnoses:  CHF exacerbation    60F here with weight gain, leg swelling, SOB. Worst since her lasix and HCTZ were stopped in the hospital due to [facility]. Sent in by Dr. Idell Pickles nurse. No CP, no  fevers. Persistent dry cough. Here vitals stable. 2+ pitting edema in legs up to knees bilaterally. Lungs clear, no rales. +JVD. +Hepatojugular reflex.  Will check labs, CXR. Labs show elevated BNP. Lasix given. Admitted.    Osvaldo Shipper, MD 11/30/13 [id number]

## 2013-11-29 NOTE — Progress Notes (Signed)
Admission BP 226/66 HR 70. Pt also expressed the desire to be a DNR. Dr. Humphrey Rolls text paged. Awaiting return call. Will continue to monitor.

## 2013-11-29 NOTE — ED Notes (Signed)
Bed: [facility] Expected date: 11/29/13 Expected time:  Means of arrival:  Comments: EMS- admonial pain

## 2013-11-29 NOTE — ED Notes (Signed)
Dr. Khan at bedside

## 2013-11-29 NOTE — Telephone Encounter (Signed)
Nurse called from patient's home.  Patient has gained 10 lb since 11/21/2013, has 3-4+ pitting edema and hands and arms swollen. Per Dr Melford Aase, patient needs to go to the ER.

## 2013-11-30 DIAGNOSIS — I150 Renovascular hypertension: Secondary | ICD-10-CM

## 2013-11-30 DIAGNOSIS — I359 Nonrheumatic aortic valve disorder, unspecified: Secondary | ICD-10-CM

## 2013-11-30 DIAGNOSIS — N183 Chronic kidney disease, stage 3 unspecified: Secondary | ICD-10-CM

## 2013-11-30 LAB — CBC
HCT: 30.4 % — ABNORMAL LOW (ref 36.0–46.0)
Hemoglobin: 10.3 g/dL — ABNORMAL LOW (ref 12.0–15.0)
MCH: 30 pg (ref 26.0–34.0)
MCHC: 33.9 g/dL (ref 30.0–36.0)
MCV: 88.6 fL (ref 78.0–100.0)
Platelets: 297 10*3/uL (ref 150–400)
RBC: 3.43 MIL/uL — ABNORMAL LOW (ref 3.87–5.11)
RDW: 14.2 % (ref 11.5–15.5)
WBC: 10.9 10*3/uL — ABNORMAL HIGH (ref 4.0–10.5)

## 2013-11-30 LAB — COMPREHENSIVE METABOLIC PANEL
ALBUMIN: 3.4 g/dL — AB (ref 3.5–5.2)
ALT: 42 U/L — ABNORMAL HIGH (ref 0–35)
AST: 35 U/L (ref 0–37)
Alkaline Phosphatase: 103 U/L (ref 39–117)
Anion gap: 13 (ref 5–15)
BUN: 45 mg/dL — ABNORMAL HIGH (ref 6–23)
CO2: 25 mEq/L (ref 19–32)
CREATININE: 1.95 mg/dL — AB (ref 0.50–1.10)
Calcium: 9.6 mg/dL (ref 8.4–10.5)
Chloride: 101 mEq/L (ref 96–112)
GFR calc Af Amer: 25 mL/min — ABNORMAL LOW (ref >90)
GFR calc non Af Amer: 22 mL/min — ABNORMAL LOW (ref >90)
Glucose, Bld: 101 mg/dL — ABNORMAL HIGH (ref 70–99)
POTASSIUM: 4 meq/L (ref 3.7–5.3)
Sodium: 139 mEq/L (ref 137–147)
Total Bilirubin: 0.6 mg/dL (ref 0.3–1.2)
Total Protein: 5.7 g/dL — ABNORMAL LOW (ref 6.0–8.3)

## 2013-11-30 LAB — TROPONIN I: Troponin I: <0.3 ng/mL (ref <0.30)

## 2013-11-30 LAB — GLUCOSE, CAPILLARY: Glucose-Capillary: 102 mg/dL — ABNORMAL HIGH (ref 70–99)

## 2013-11-30 LAB — HEMOGLOBIN A1C
Hgb A1c MFr Bld: 5.3 % (ref <5.7)
Mean Plasma Glucose: 105 mg/dL (ref <117)

## 2013-11-30 LAB — TSH: TSH: 15.83 u[IU]/mL — ABNORMAL HIGH (ref 0.350–4.500)

## 2013-11-30 MED ORDER — LEVOTHYROXINE SODIUM 175 MCG PO TABS
175.0000 ug | ORAL_TABLET | Freq: Every day | ORAL | Status: DC
  Administered 2013-12-01 – 2013-12-02 (×2): 175 ug via ORAL
  Filled 2013-11-30 (×3): qty 1

## 2013-11-30 MED ORDER — FUROSEMIDE 10 MG/ML IJ SOLN
40.0000 mg | Freq: Every day | INTRAMUSCULAR | Status: DC
  Administered 2013-12-01: 40 mg via INTRAVENOUS
  Filled 2013-11-30: qty 4

## 2013-11-30 NOTE — Progress Notes (Signed)
[facility] PROGRESS NOTE  KELSEY DURFLINGER ZDG:[id number] DOB: [birth year] DOA: 11/29/2013 PCP: Alesia Richards, MD  Assessment/Plan: 80 y/o female with CKD stage III HTN, CHF, diverticulosis, hypothyroidism, anemia of chronic diease, vit D deficiency presented with leg edema, and SOB, DOE   1. Acute on chronic CHF, diastolic HF; echo (5/95): LVEF 63%, grade 2 diastolic dysfunction -Pt was not taking diuretics at home  -started IV lasix; cont daily weight, I/O; pend repeat echo; monitor   2. HTN, uncontrolled; resume home regimen, titrate per response  3. CKD III, baseline Cr likely around 2.0; cont close monitor while on diuresis  4. Hypothyroidism, TSH 15.8 -increased synthroid daily 175 mcg; recheck OP in 3-4 weeks   Code Status: DNR Family Communication: d/w patient, her nephew  (indicate person spoken with, relationship, and if by phone, the number) Disposition Plan: pend PT eval    Consultants:  none  Procedures:  none  Antibiotics:  None  (indicate start date, and stop date if known)  HPI/Subjective: alert  Objective: Filed Vitals:   11/30/13 1212  BP: 175/54  Pulse:   Temp:   Resp:     Intake/Output Summary (Last 24 hours) at 11/30/13 1411 Last data filed at 11/30/13 1340  Gross per 24 hour  Intake    360 ml  Output   2450 ml  Net  -2090 ml   Filed Weights   11/29/13 2100 11/30/13 0500  Weight: 63.73 kg (140 lb 8 oz) 64.5 kg (142 lb 3.2 oz)    Exam:   General:  alert  Cardiovascular: s1,s2 rrr  Respiratory: LL crackles   Abdomen: soft, distended, nt  Musculoskeletal: LE edema   Data Reviewed: Basic Metabolic Panel:  Recent Labs Lab 11/29/13 1715 11/30/13 0405  NA 136* 139  K 4.5 4.0  CL 99 101  CO2 21 25  GLUCOSE 102* 101*  BUN 44* 45*  CREATININE 1.86* 1.95*  CALCIUM 9.4 9.6   Liver Function Tests:  Recent Labs Lab 11/30/13 0405  AST 35  ALT 42*  ALKPHOS 103  BILITOT 0.6  PROT 5.7*  ALBUMIN 3.4*    No results found for this basename: LIPASE, AMYLASE,  in the last 168 hours No results found for this basename: AMMONIA,  in the last 168 hours CBC:  Recent Labs Lab 11/29/13 1715 11/30/13 0405  WBC 9.5 10.9*  HGB 10.1* 10.3*  HCT 29.7* 30.4*  MCV 87.1 88.6  PLT 265 297   Cardiac Enzymes:  Recent Labs Lab 11/29/13 2200 11/30/13 0405 11/30/13 1030  TROPONINI <0.30 <0.30 <0.30   BNP (last 3 results)  Recent Labs  11/29/13 1719  PROBNP 4314.0*   CBG:  Recent Labs Lab 11/30/13 0800  GLUCAP 102*    No results found for this or any previous visit (from the past 240 hour(s)).   Studies: Dg Chest 2 View  11/29/2013   CLINICAL DATA:  Short of breath.  Dry cough.  EXAM: CHEST  2 VIEW  COMPARISON:  05/05/2012 and 01/03/2010.  FINDINGS: There are small to moderate bilateral pleural effusions with associated lung base opacity, new from the prior study. Lung base opacity is likely atelectasis. Pneumonia is possible but felt less likely.  Lungs are hyperexpanded. Mild scarring is noted at the apices. No pulmonary edema.  Cardiac silhouette is normal in size. Normal mediastinal contour. No hilar masses.  Bony thorax is diffusely demineralized but grossly intact.  IMPRESSION: 1. Small moderate bilateral pleural effusions with associated lung base opacity. Lung base  opacity is likely atelectasis. Pneumonia is possible. No pulmonary edema.   Electronically Signed   By: Lajean Manes M.D.   On: 11/29/2013 18:11    Scheduled Meds: . allopurinol  100 mg Oral q morning - 10a  . amLODipine  10 mg Oral Daily  . aspirin EC  325 mg Oral QHS  . atorvastatin  20 mg Oral Daily  . bisoprolol  10 mg Oral Daily  . calcium carbonate  500 mg Oral BID  . cholecalciferol  1,000 Units Oral q morning - 10a  . cloNIDine  0.1 mg Oral TID  . docusate sodium  100 mg Oral BID  . folic acid  1 mg Oral Daily  . furosemide  40 mg Intravenous BID  . heparin  5,000 Units Subcutaneous 3 times per day  .  [START ON 12/01/2013] levothyroxine  125 mcg Oral Once per day on Sun Tue Thu Sat  . levothyroxine  187.5 mcg Oral Once per day on Mon Wed Fri  . multivitamin with minerals  1 tablet Oral Daily  . nitroGLYCERIN  1 inch Topical 4 times per day  . pantoprazole  40 mg Oral BID AC  . sodium chloride  3 mL Intravenous Q12H  . sodium chloride  3 mL Intravenous Q12H  . thiamine  100 mg Oral Daily   Continuous Infusions:   Active Problems:   Chronic diastolic CHF (congestive heart failure)   Hyperlipidemia   Hypertension   Hypothyroidism   CHF (congestive heart failure)    Time spent: >35 minutes     Kinnie Feil  [facility] Pager [phone]. If 7PM-7AM, please contact night-coverage at [email or web address], password [facility] 11/30/2013, 2:11 PM  LOS: 1 day

## 2013-11-30 NOTE — Care Management Note (Signed)
    Page 1 of 1   11/30/2013     3:34:01 PM CARE MANAGEMENT NOTE 11/30/2013  Patient:  Rachael [name], Rachael [name]   Account Number:  [id number]  Date Initiated:  11/30/2013  Documentation initiated by:  Dessa Phi  Subjective/Objective Assessment:   80 Y/O F ADMITTED W/CHF.READMIT-6/21-6/25/15-HTN URGENCY.     Action/Plan:   FROM HOME.HAS PCP,PHARMACY.ACTIVE W/GENTIVA HHRN/PT.   Anticipated DC Date:  12/04/2013   Anticipated DC Plan:  [place]  CM consult      Choice offered to / List presented to:             Status of service:  In process, will continue to follow Medicare Important Message given?   (If response is "NO", the following Medicare IM given date fields will be blank) Date Medicare IM given:   Medicare IM given by:   Date Additional Medicare IM given:   Additional Medicare IM given by:    Discharge Disposition:    Per UR Regulation:  Reviewed for med. necessity/level of care/duration of stay  If discussed at [place] of Stay Meetings, dates discussed:    Comments:  11/30/13 [facility] RN,BSN NCM [place] W/GENTIVA W/DEBBIE REP.IF MD AGREE RESUME HHRN/PT.

## 2013-11-30 NOTE — Progress Notes (Signed)
   Echocardiogram 2D Echocardiogram has been performed.  Joelene Millin 11/30/2013, 1:33 PM

## 2013-11-30 NOTE — Progress Notes (Signed)
Pt BP 175/54. Asymptomatic. MD notified. Scheduled Norvasc and Clonidine adm. Will continue to monitor.

## 2013-12-01 DIAGNOSIS — E039 Hypothyroidism, unspecified: Secondary | ICD-10-CM

## 2013-12-01 DIAGNOSIS — I5033 Acute on chronic diastolic (congestive) heart failure: Principal | ICD-10-CM

## 2013-12-01 LAB — GLUCOSE, CAPILLARY
Glucose-Capillary: 103 mg/dL — ABNORMAL HIGH (ref 70–99)
Glucose-Capillary: 89 mg/dL (ref 70–99)

## 2013-12-01 LAB — IRON AND TIBC
Iron: 31 ug/dL — ABNORMAL LOW (ref 42–135)
SATURATION RATIOS: 11 % — AB (ref 20–55)
TIBC: 279 ug/dL (ref 250–470)
UIBC: 248 ug/dL (ref 125–400)

## 2013-12-01 LAB — BASIC METABOLIC PANEL
ANION GAP: 12 (ref 5–15)
BUN: 43 mg/dL — AB (ref 6–23)
CO2: 27 mEq/L (ref 19–32)
CREATININE: 1.98 mg/dL — AB (ref 0.50–1.10)
Calcium: 9.2 mg/dL (ref 8.4–10.5)
Chloride: 103 mEq/L (ref 96–112)
GFR calc Af Amer: 25 mL/min — ABNORMAL LOW (ref >90)
GFR calc non Af Amer: 22 mL/min — ABNORMAL LOW (ref >90)
Glucose, Bld: 99 mg/dL (ref 70–99)
Potassium: 3.7 mEq/L (ref 3.7–5.3)
Sodium: 142 mEq/L (ref 137–147)

## 2013-12-01 LAB — TSH: TSH: 18.7 u[IU]/mL — AB (ref 0.350–4.500)

## 2013-12-01 LAB — LIPID PANEL
CHOL/HDL RATIO: 3 ratio
Cholesterol: 102 mg/dL (ref 0–200)
HDL: 34 mg/dL — ABNORMAL LOW (ref >39)
LDL CALC: 46 mg/dL (ref 0–99)
Triglycerides: 108 mg/dL (ref <150)
VLDL: 22 mg/dL (ref 0–40)

## 2013-12-01 MED ORDER — POTASSIUM CHLORIDE CRYS ER 20 MEQ PO TBCR
20.0000 meq | EXTENDED_RELEASE_TABLET | Freq: Two times a day (BID) | ORAL | Status: DC
  Administered 2013-12-01 – 2013-12-02 (×3): 20 meq via ORAL
  Filled 2013-12-01 (×4): qty 1

## 2013-12-01 MED ORDER — FUROSEMIDE 10 MG/ML IJ SOLN
40.0000 mg | Freq: Two times a day (BID) | INTRAMUSCULAR | Status: DC
  Administered 2013-12-01 – 2013-12-02 (×2): 40 mg via INTRAVENOUS
  Filled 2013-12-01 (×4): qty 4

## 2013-12-01 MED ORDER — TRAZODONE 25 MG HALF TABLET
25.0000 mg | ORAL_TABLET | Freq: Every day | ORAL | Status: DC
  Administered 2013-12-01: 25 mg via ORAL
  Filled 2013-12-01 (×2): qty 1

## 2013-12-01 NOTE — Progress Notes (Signed)
[facility] PROGRESS NOTE  Rachael [name] SWF:[id number] DOB: 27-Dec-1923 DOA: 11/29/2013 PCP: Alesia Richards, MD  Assessment/Plan: 80 y/o female with CKD stage III HTN, CHF, diverticulosis, hypothyroidism, anemia of chronic diease, vit D deficiency presented with leg edema, and SOB, DOE   1. Acute on chronic CHF, diastolic HF; echo (2/20): LVEF 25%, grade 2 diastolic dysfunction -Pt was not taking diuretics at home  -increase IV lasix; cont daily weight, I/O; monitor   2. HTN, uncontrolled; resume home regimen, titrate per response  3. CKD III, baseline Cr likely around 2.0; cont close monitor while on diuresis  4. Hypothyroidism, TSH 15.8 -increased synthroid daily 175 mcg; recheck OP in 3-4 weeks   Code Status: DNR Family Communication: d/w patient,   (indicate person spoken with, relationship, and if by phone, the number) Disposition Plan: PT eval: HHC PT   Consultants:  none  Procedures:  none  Antibiotics:  None  (indicate start date, and stop date if known)  HPI/Subjective: alert  Objective: Filed Vitals:   12/01/13 0940  BP: 164/45  Pulse: 55  Temp:   Resp:     Intake/Output Summary (Last 24 hours) at 12/01/13 1200 Last data filed at 12/01/13 0410  Gross per 24 hour  Intake    120 ml  Output   2250 ml  Net  -2130 ml   Filed Weights   11/29/13 2100 11/30/13 0500 12/01/13 0410  Weight: 63.73 kg (140 lb 8 oz) 64.5 kg (142 lb 3.2 oz) 64.7 kg (142 lb 10.2 oz)    Exam:   General:  alert  Cardiovascular: s1,s2 rrr  Respiratory: LL crackles   Abdomen: soft, distended, nt  Musculoskeletal: LE edema   Data Reviewed: Basic Metabolic Panel:  Recent Labs Lab 11/29/13 1715 11/30/13 0405 12/01/13 0536  NA 136* 139 142  K 4.5 4.0 3.7  CL 99 101 103  CO2 21 25 27   GLUCOSE 102* 101* 99  BUN 44* 45* 43*  CREATININE 1.86* 1.95* 1.98*  CALCIUM 9.4 9.6 9.2   Liver Function Tests:  Recent Labs Lab 11/30/13 0405  AST 35  ALT  42*  ALKPHOS 103  BILITOT 0.6  PROT 5.7*  ALBUMIN 3.4*   No results found for this basename: LIPASE, AMYLASE,  in the last 168 hours No results found for this basename: AMMONIA,  in the last 168 hours CBC:  Recent Labs Lab 11/29/13 1715 11/30/13 0405  WBC 9.5 10.9*  HGB 10.1* 10.3*  HCT 29.7* 30.4*  MCV 87.1 88.6  PLT 265 297   Cardiac Enzymes:  Recent Labs Lab 11/29/13 2200 11/30/13 0405 11/30/13 1030  TROPONINI <0.30 <0.30 <0.30   BNP (last 3 results)  Recent Labs  11/29/13 1719  PROBNP 4314.0*   CBG:  Recent Labs Lab 11/30/13 0800 12/01/13 0754  GLUCAP 102* 103*    No results found for this or any previous visit (from the past 240 hour(s)).   Studies: Dg Chest 2 View  11/29/2013   CLINICAL DATA:  Short of breath.  Dry cough.  EXAM: CHEST  2 VIEW  COMPARISON:  05/05/2012 and 01/03/2010.  FINDINGS: There are small to moderate bilateral pleural effusions with associated lung base opacity, new from the prior study. Lung base opacity is likely atelectasis. Pneumonia is possible but felt less likely.  Lungs are hyperexpanded. Mild scarring is noted at the apices. No pulmonary edema.  Cardiac silhouette is normal in size. Normal mediastinal contour. No hilar masses.  Bony thorax is diffusely demineralized but  grossly intact.  IMPRESSION: 1. Small moderate bilateral pleural effusions with associated lung base opacity. Lung base opacity is likely atelectasis. Pneumonia is possible. No pulmonary edema.   Electronically Signed   By: Lajean Manes M.D.   On: 11/29/2013 18:11    Scheduled Meds: . allopurinol  100 mg Oral q morning - 10a  . amLODipine  10 mg Oral Daily  . aspirin EC  325 mg Oral QHS  . atorvastatin  20 mg Oral Daily  . bisoprolol  10 mg Oral Daily  . calcium carbonate  500 mg Oral BID  . cholecalciferol  1,000 Units Oral q morning - 10a  . cloNIDine  0.1 mg Oral TID  . docusate sodium  100 mg Oral BID  . folic acid  1 mg Oral Daily  . furosemide  40  mg Intravenous Daily  . heparin  5,000 Units Subcutaneous 3 times per day  . levothyroxine  175 mcg Oral QAC breakfast  . multivitamin with minerals  1 tablet Oral Daily  . nitroGLYCERIN  1 inch Topical 4 times per day  . pantoprazole  40 mg Oral BID AC  . sodium chloride  3 mL Intravenous Q12H  . sodium chloride  3 mL Intravenous Q12H  . thiamine  100 mg Oral Daily   Continuous Infusions:   Active Problems:   Chronic diastolic CHF (congestive heart failure)   Hyperlipidemia   Hypertension   Hypothyroidism   CHF (congestive heart failure)    Time spent: >35 minutes     Kinnie Feil  [facility] Pager [phone]. If 7PM-7AM, please contact night-coverage at [email or web address], password [facility] 12/01/2013, 12:00 PM  LOS: 2 days

## 2013-12-01 NOTE — Evaluation (Signed)
Physical Therapy Evaluation Patient Details Name: Rachael [name] MRN: [id number] DOB: 1923/10/20 Today's Date: 12/01/2013   History of Present Illness  80 year old female with CKD stage III HTN, CHF, diverticulosis, hypothyroidism, anemia of chronic diease, vit D deficiency admitted 11/29/13 for acute on chronic CHF.  Clinical Impression  Pt admitted with above. Pt currently with functional limitations due to the deficits listed below (see PT Problem List).  Pt will benefit from skilled PT to increase their independence and safety with mobility to allow discharge to the venue listed below.  Pt familiar to this PT from previous admission and pt reports no falls since last admission.  Pt reports she had 2 HHPT visits prior to readmission and would like to continue HHPT upon d/c.  Pt reports "plenty of friends/family check" on her often.        Follow Up Recommendations Home health PT    Equipment Recommendations  None recommended by PT    Recommendations for Other Services       Precautions / Restrictions Precautions Precautions: Fall      Mobility  Bed Mobility               General bed mobility comments: pt up in recliner on arrival  Transfers Overall transfer level: Needs assistance Equipment used: None Transfers: Sit to/from Stand Sit to Stand: Min guard         General transfer comment: verbal cues for technique, use of armrests  Ambulation/Gait Ambulation/Gait assistance: Min guard Ambulation Distance (Feet): 360 Feet Assistive device: None Gait Pattern/deviations: Step-through pattern;Decreased stride length Gait velocity: decr   General Gait Details: pt reports generalized weakness, denies recent hx of falls, very cautious with floor changes and occasionally steadying with furniture in hospital room, recommended using assistive device at home if feeling unsteady  Stairs            Wheelchair Mobility    Modified Rankin (Stroke Patients Only)       Balance                                             Pertinent Vitals/Pain Denies pain    Home Living Family/patient expects to be discharged to:: Private residence Living Arrangements: Alone Available Help at Discharge: Friend(s);Available PRN/intermittently Type of Home: House       Home Layout: One level Home Equipment: [place] - 2 wheels;Wheelchair - manual;Cane - single point      Prior Function Level of Independence: Independent with assistive device(s)         Comments: states she usually ambulates around home without assistive device     Hand Dominance        Extremity/Trunk Assessment               Lower Extremity Assessment: Overall WFL for tasks assessed         Communication   Communication: No difficulties  Cognition Arousal/Alertness: Awake/alert Behavior During Therapy: WFL for tasks assessed/performed Overall Cognitive Status: Within Functional Limits for tasks assessed                      General Comments      Exercises        Assessment/Plan    PT Assessment Patient needs continued PT services  PT Diagnosis Difficulty walking;Generalized weakness   PT Problem List Decreased activity  tolerance;Decreased mobility;Decreased strength  PT Treatment Interventions DME instruction;Gait training;Functional mobility training;Therapeutic activities;Therapeutic exercise;Patient/family education;Balance training   PT Goals (Current goals can be found in the Care Plan section) Acute Rehab PT Goals PT Goal Formulation: With patient Time For Goal Achievement: 12/15/13 Potential to Achieve Goals: Good    Frequency Min 3X/week   Barriers to discharge        Co-evaluation               End of Session   Activity Tolerance: Patient tolerated treatment well Patient left: with call bell/phone within reach;in chair;with chair alarm set           Time: 0998-3382 PT Time Calculation (min): 21  min   Charges:   PT Evaluation $Initial PT Evaluation Tier I: 1 Procedure PT Treatments $Gait Training: 8-22 mins   PT G Codes:          Dinora Hemm,KATHrine E 12/01/2013, 11:27 AM Carmelia Bake, PT, DPT 12/01/2013 Pager: [phone]

## 2013-12-02 LAB — BASIC METABOLIC PANEL
Anion gap: 11 (ref 5–15)
BUN: 41 mg/dL — AB (ref 6–23)
CHLORIDE: 102 meq/L (ref 96–112)
CO2: 28 mEq/L (ref 19–32)
CREATININE: 2.12 mg/dL — AB (ref 0.50–1.10)
Calcium: 9 mg/dL (ref 8.4–10.5)
GFR calc non Af Amer: 20 mL/min — ABNORMAL LOW (ref >90)
GFR, EST AFRICAN AMERICAN: 23 mL/min — AB (ref >90)
GLUCOSE: 98 mg/dL (ref 70–99)
Potassium: 3.9 mEq/L (ref 3.7–5.3)
Sodium: 141 mEq/L (ref 137–147)

## 2013-12-02 LAB — GLUCOSE, CAPILLARY: GLUCOSE-CAPILLARY: 85 mg/dL (ref 70–99)

## 2013-12-02 MED ORDER — CLONIDINE HCL 0.1 MG PO TABS: 0.1000 mg | ORAL_TABLET | Freq: Three times a day (TID) | ORAL | Status: DC

## 2013-12-02 MED ORDER — ATORVASTATIN CALCIUM 20 MG PO TABS: 20.0000 mg | ORAL_TABLET | Freq: Every day | ORAL | Status: DC

## 2013-12-02 MED ORDER — TRAZODONE 25 MG HALF TABLET: 25.0000 mg | ORAL_TABLET | Freq: Every evening | ORAL | Status: DC | PRN

## 2013-12-02 MED ORDER — AMLODIPINE BESYLATE 10 MG PO TABS: 10.0000 mg | ORAL_TABLET | Freq: Every day | ORAL | Status: DC

## 2013-12-02 MED ORDER — POTASSIUM CHLORIDE CRYS ER 20 MEQ PO TBCR: 20.0000 meq | EXTENDED_RELEASE_TABLET | Freq: Every day | ORAL | Status: DC

## 2013-12-02 MED ORDER — LEVOTHYROXINE SODIUM 175 MCG PO TABS: 175.0000 ug | ORAL_TABLET | Freq: Every day | ORAL | Status: DC

## 2013-12-02 MED ORDER — FUROSEMIDE 40 MG PO TABS: 40.0000 mg | ORAL_TABLET | Freq: Every day | ORAL | Status: DC

## 2013-12-02 MED ORDER — BISOPROLOL FUMARATE 10 MG PO TABS: 10.0000 mg | ORAL_TABLET | Freq: Every day | ORAL | Status: DC

## 2013-12-02 NOTE — Discharge Summary (Signed)
Physician Discharge Summary  Rachael [name] ZOX:[id number] DOB: 09-08-1923 DOA: 11/29/2013  PCP: Alesia Richards, MD  Admit date: 11/29/2013 Discharge date: 12/02/2013  Time spent: >35 minutes  Recommendations for Outpatient Follow-up:  HHC/PT F/u with PCP in 1 week  F/u with cardiologist in 10 day s F/u with nephrologist in 12 weeks  Discharge Diagnoses:  Active Problems:   Chronic diastolic CHF (congestive heart failure)   Hyperlipidemia   Hypertension   Hypothyroidism   CHF (congestive heart failure)   Discharge Condition: stable   Diet recommendation: low sodium   Filed Weights   11/30/13 0500 12/01/13 0410 12/02/13 0424  Weight: 64.5 kg (142 lb 3.2 oz) 64.7 kg (142 lb 10.2 oz) 61.7 kg (136 lb 0.4 oz)    History of present illness:    Hospital Course:  80 y/o female with CKD stage III HTN, CHF, diverticulosis, hypothyroidism, anemia of chronic diease, vit D deficiency presented with leg edema, and SOB, DOE    Procedures: 1. Acute on chronic CHF, diastolic HF; echo (8/11): LVEF 91%, grade 2 diastolic dysfunction  -Pt was not taking diuretics at home  -symptoms improved  IV lasix, some residual leg edema, but lung clear at the discharge; recommended to cont diuresis slowly in elderly patient  -cont lasix 40 mg daily; outpatient follow up with further titration as needed;   2. HTN, resume home regimen,added lasix; cont outpatient titration as needed 3. CKD III, baseline Cr likely around 2.0; cont close monitor while on diuresis; recommended to f/u with nephrologist  4. Hypothyroidism, TSH 15.8  -increased synthroid daily 175 mcg; recheck OP in 3-4 weeks as outpatient      (i.e. Studies not automatically included, echos, thoracentesis, etc; not x-rays)  Consultations:  none  Discharge Exam: Filed Vitals:   12/02/13 0424  BP: 176/62  Pulse: 64  Temp: 97.5 F (36.4 C)  Resp: 18    General: alert Cardiovascular: s1,s2 rrr Respiratory: CTA  BL  Discharge Instructions  Discharge Instructions   Diet - low sodium heart healthy    Complete by:  As directed      Discharge instructions    Complete by:  As directed   Please follow up with primary care doctor in 1 week  Please follow up with nephrologist in 1 week  Please follow up with cardiologist in 2 weeks     Increase activity slowly    Complete by:  As directed             Medication List         allopurinol 100 MG tablet  Commonly known as:  ZYLOPRIM  Take 100 mg by mouth every morning.     amLODipine 10 MG tablet  Commonly known as:  NORVASC  Take 1 tablet (10 mg total) by mouth daily.     aspirin EC 325 MG tablet  Take 325 mg by mouth at bedtime.     atorvastatin 20 MG tablet  Commonly known as:  LIPITOR  Take 1 tablet (20 mg total) by mouth daily.     Biotin 1000 MCG tablet  Take 1,000 mcg by mouth every morning.     bisoprolol 10 MG tablet  Commonly known as:  ZEBETA  Take 1 tablet (10 mg total) by mouth daily.     CALCIUM ANTACID 500 MG chewable tablet  Generic drug:  calcium carbonate  Chew 1 tablet by mouth 2 (two) times daily.     cholecalciferol 1000 UNITS tablet  Commonly known  as:  VITAMIN D  Take 1,000 Units by mouth every morning.     cloNIDine 0.1 MG tablet  Commonly known as:  CATAPRES  Take 1 tablet (0.1 mg total) by mouth 3 (three) times daily.     furosemide 40 MG tablet  Commonly known as:  LASIX  Take 1 tablet (40 mg total) by mouth daily.     levothyroxine 175 MCG tablet  Commonly known as:  SYNTHROID, LEVOTHROID  Take 1 tablet (175 mcg total) by mouth daily before breakfast.     multivitamin with minerals Tabs tablet  Take 1 tablet by mouth every morning.     pantoprazole 40 MG tablet  Commonly known as:  PROTONIX  Take 1 tablet (40 mg total) by mouth 2 (two) times daily before a meal.     polyvinyl alcohol 1.4 % ophthalmic solution  Commonly known as:  LIQUIFILM TEARS  Place 1-2 drops into both eyes as needed  for dry eyes.     potassium chloride SA 20 MEQ tablet  Commonly known as:  K-DUR,KLOR-CON  Take 1 tablet (20 mEq total) by mouth daily.     sodium chloride 5 % ophthalmic solution  Commonly known as:  MURO 128  Place 1 drop into both eyes at bedtime.     SYSTANE BALANCE OP  Apply 1-2 drops to eye 3 (three) times daily.     traZODone 25 mg Tabs tablet  Commonly known as:  DESYREL  Take 0.5 tablets (25 mg total) by mouth at bedtime as needed for sleep.       Allergies  Allergen Reactions  . Aricept [Donepezil Hcl]     Nausea and vomiting  . Prevacid [Lansoprazole]     GI upset  . Tylenol [Acetaminophen]     Tylenol #3 - Nausea       Follow-up Information   Follow up with MCKEOWN,WILLIAM DAVID, MD In 1 week.   Specialty:  Internal Medicine   Contact information:   [address] [place] [address] [phone]       Follow up with Windy Kalata, MD. Schedule an appointment as soon as possible for a visit in 1 week.   Specialty:  Nephrology   Contact information:   [place] [address] [phone]       Follow up with [facility]). Schedule an appointment as soon as possible for a visit in 10 days.   Specialty:  Cardiology   Contact information:   [address], [place] [address] [phone]      Follow up with [facility]. Schedule an appointment as soon as possible for a visit in 1 week.   Specialty:  Internal Medicine   Contact information:   [place] [id number] [phone]       The results of significant diagnostics from this hospitalization (including imaging, microbiology, ancillary and laboratory) are listed below for reference.    Significant Diagnostic Studies: Dg Chest 2 View  11/29/2013   CLINICAL DATA:  Short of breath.  Dry cough.  EXAM: CHEST  2 VIEW  COMPARISON:  05/05/2012 and 01/03/2010.  FINDINGS:  There are small to moderate bilateral pleural effusions with associated lung base opacity, new from the prior study. Lung base opacity is likely atelectasis. Pneumonia is possible but felt less likely.  Lungs are hyperexpanded. Mild scarring is noted at the apices. No pulmonary edema.  Cardiac silhouette is normal  in size. Normal mediastinal contour. No hilar masses.  Bony thorax is diffusely demineralized but grossly intact.  IMPRESSION: 1. Small moderate bilateral pleural effusions with associated lung base opacity. Lung base opacity is likely atelectasis. Pneumonia is possible. No pulmonary edema.   Electronically Signed   By: Lajean Manes M.D.   On: 11/29/2013 18:11   Ct Head Wo Contrast  11/11/2013   CLINICAL DATA:  Hypertension.  EXAM: CT HEAD WITHOUT CONTRAST  TECHNIQUE: Contiguous axial images were obtained from the base of the skull through the vertex without intravenous contrast.  COMPARISON:  Head CT 02/08/2013.  FINDINGS: Physiologic calcifications are noted in the basal ganglia bilaterally (left greater than right). Well-defined foci of low attenuation in the right globus pallidus are compatible with old lacunar infarctions. No acute intracranial abnormalities. Specifically, no evidence of acute intracranial hemorrhage, no definite findings of acute/subacute cerebral ischemia, no mass, mass effect, hydrocephalus or abnormal intra or extra-axial fluid collections. Visualized paranasal sinuses and mastoids are well pneumatized. No acute displaced skull fractures are identified.  IMPRESSION: 1. No acute intracranial abnormalities. 2. 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   Electronically Signed   By: Vinnie Langton M.D.   On: 11/11/2013 17:28    Microbiology: No results found for this or any previous visit (from the past 240 hour(s)).   Labs: Basic Metabolic Panel:  Recent Labs Lab 11/29/13 1715 11/30/13 0405 12/01/13 0536 12/02/13 0522  NA 136* 139 142 141  K 4.5 4.0 3.7  3.9  CL 99 101 103 102  CO2 21 25 27 28   GLUCOSE 102* 101* 99 98  BUN 44* 45* 43* 41*  CREATININE 1.86* 1.95* 1.98* 2.12*  CALCIUM 9.4 9.6 9.2 9.0   Liver Function Tests:  Recent Labs Lab 11/30/13 0405  AST 35  ALT 42*  ALKPHOS 103  BILITOT 0.6  PROT 5.7*  ALBUMIN 3.4*   No results found for this basename: LIPASE, AMYLASE,  in the last 168 hours No results found for this basename: AMMONIA,  in the last 168 hours CBC:  Recent Labs Lab 11/29/13 1715 11/30/13 0405  WBC 9.5 10.9*  HGB 10.1* 10.3*  HCT 29.7* 30.4*  MCV 87.1 88.6  PLT 265 297   Cardiac Enzymes:  Recent Labs Lab 11/29/13 2200 11/30/13 0405 11/30/13 1030  TROPONINI <0.30 <0.30 <0.30   BNP: BNP (last 3 results)  Recent Labs  11/29/13 1719  PROBNP 4314.0*   CBG:  Recent Labs Lab 11/30/13 0800 12/01/13 0754 12/01/13 1715 12/02/13 0744  GLUCAP 102* 103* 89 85       Signed:  Kobie Matkins N  [facility] 12/02/2013, 9:48 AM

## 2013-12-02 NOTE — Progress Notes (Signed)
CARE MANAGEMENT NOTE 12/02/2013  Patient:  Rachael [name], Rachael [name]   Account Number:  [id number]  Date Initiated:  11/30/2013  Documentation initiated by:  [facility]  Subjective/Objective Assessment:   80 Y/O F ADMITTED W/CHF.READMIT-6/21-6/25/15-HTN URGENCY.     Action/Plan:   FROM HOME.HAS PCP,PHARMACY.ACTIVE W/GENTIVA HHRN/PT.   Anticipated DC Date:  12/04/2013   Anticipated DC Plan:  [place]  CM consult      Choice offered to / List presented to:  C-1 Patient        Council arranged  HH-2 PT      [place]   Status of service:  Completed, signed off Medicare Important Message given?  NA - LOS <3 / Initial given by admissions (If response is "NO", the following Medicare IM given date fields will be blank) Date Medicare IM given:   Medicare IM given by:   Date Additional Medicare IM given:   Additional Medicare IM given by:    Discharge Disposition:  [place]  Per UR Regulation:  Reviewed for med. necessity/level of care/duration of stay  If discussed at [place] of Stay Meetings, dates discussed:    Comments:  12/02/2013 1720 Pt active with [facility]. Aware of dc home today. Jonnie Finner RN CCM Case Mgmt [phone]  11/30/13 KATHY MAHABIR RN,BSN NCM [place] W/GENTIVA W/DEBBIE REP.IF MD AGREE RESUME HHRN/PT.

## 2013-12-02 NOTE — Discharge Instructions (Signed)
Please follow up with primary care doctor in 1 week  Please follow up with nephrologist in 1 week  Please follow up with cardiologist in 2 weeks

## 2013-12-02 NOTE — Progress Notes (Signed)
Pt's night was uneventful. No complaints of pain. Pt expressed frustration with having to urinate so often due to IV Lasix. Will continue to monitor.

## 2013-12-03 NOTE — Progress Notes (Signed)
Received call from [facility] rep, states pt was active with [facility] RN and OT at home. Orders were for [facility] PT. Orders updated. [facility] will resume HH. Jonnie Finner RN CCM Case Mgmt phone  [phone]

## 2013-12-05 ENCOUNTER — Ambulatory Visit (INDEPENDENT_AMBULATORY_CARE_PROVIDER_SITE_OTHER): Payer: Medicare Other | Admitting: Physician Assistant

## 2013-12-05 ENCOUNTER — Encounter (HOSPITAL_COMMUNITY): Payer: PRIVATE HEALTH INSURANCE

## 2013-12-05 ENCOUNTER — Encounter: Payer: Self-pay | Admitting: Physician Assistant

## 2013-12-05 VITALS — BP 190/60 | HR 57 | Ht 68.5 in | Wt 136.0 lb

## 2013-12-05 DIAGNOSIS — I5032 Chronic diastolic (congestive) heart failure: Secondary | ICD-10-CM

## 2013-12-05 DIAGNOSIS — I2510 Atherosclerotic heart disease of native coronary artery without angina pectoris: Secondary | ICD-10-CM

## 2013-12-05 DIAGNOSIS — K2960 Other gastritis without bleeding: Secondary | ICD-10-CM

## 2013-12-05 DIAGNOSIS — N183 Chronic kidney disease, stage 3 unspecified: Secondary | ICD-10-CM

## 2013-12-05 DIAGNOSIS — I509 Heart failure, unspecified: Secondary | ICD-10-CM

## 2013-12-05 DIAGNOSIS — E785 Hyperlipidemia, unspecified: Secondary | ICD-10-CM

## 2013-12-05 DIAGNOSIS — R55 Syncope and collapse: Secondary | ICD-10-CM

## 2013-12-05 DIAGNOSIS — I6529 Occlusion and stenosis of unspecified carotid artery: Secondary | ICD-10-CM

## 2013-12-05 DIAGNOSIS — I10 Essential (primary) hypertension: Secondary | ICD-10-CM

## 2013-12-05 DIAGNOSIS — I6523 Occlusion and stenosis of bilateral carotid arteries: Secondary | ICD-10-CM

## 2013-12-05 DIAGNOSIS — I658 Occlusion and stenosis of other precerebral arteries: Secondary | ICD-10-CM

## 2013-12-05 DIAGNOSIS — R609 Edema, unspecified: Secondary | ICD-10-CM

## 2013-12-05 MED ORDER — CLONIDINE HCL 0.2 MG PO TABS: 0.2000 mg | ORAL_TABLET | Freq: Two times a day (BID) | ORAL | Status: DC

## 2013-12-05 MED ORDER — FUROSEMIDE 40 MG PO TABS: 40.0000 mg | ORAL_TABLET | Freq: Every day | ORAL | Status: DC

## 2013-12-05 MED ORDER — CLONIDINE HCL 0.1 MG PO TABS: 0.1000 mg | ORAL_TABLET | Freq: Three times a day (TID) | ORAL | Status: DC

## 2013-12-05 MED ORDER — FUROSEMIDE 40 MG PO TABS: ORAL_TABLET | ORAL | Status: DC

## 2013-12-05 NOTE — Patient Instructions (Addendum)
Your physician has recommended you make the following change in your medication: 1. Increase lasix to 40 MG twice a day for three days then decrease back to 60 MG daily 2. Increase clonidine to 0.2 MG Twice a day  Your physician recommends that you go to the lab today for a CBC and BMET  Your physician recommends that you return for lab work in: One Week on 7.22.15 for a BMET  You have been referred to [facility]. We will be contacting you with an appointment once it is made.  Your physician has recommended that you wear an event monitor. Event monitors are medical devices that record the heart's electrical activity. Doctors most often [place] these monitors to diagnose arrhythmias. Arrhythmias are problems with the speed or rhythm of the heartbeat. The monitor is a small, portable device. You can wear one while you do your normal daily activities. This is usually used to diagnose what is causing palpitations/syncope (passing out).  Your physician has requested that you have a lower or upper extremity arterial duplex. This test is an ultrasound of the arteries in the legs or arms. It looks at arterial blood flow in the legs and arms. Allow one hour for Lower and Upper Arterial scans. There are no restrictions or special instructions( This will be done today.)  Your physician recommends that you schedule a follow-up appointment in: Two weeks with Richardson Dopp on July 30th at 3:00 PM  PT REFUSED ALL OF THE ABOVE

## 2013-12-05 NOTE — Progress Notes (Signed)
Cardiology Office Note    Date:  12/05/2013   ID:  Rachael [name], DOB 29-May-1923, MRN [id number]  PCP:  Alesia Richards, MD  Cardiologist:  Dr. Loralie Champagne      History of Present Illness: Rachael [name] is a 80 y.o. female with a hx of HTN, CKD, TIAs, and chronic diastolic. She fractured both of her femurs last year, once in 11/13 and once in 12/13. She had surgery both times. She was then admitted in 5/14 with dehydration and acute on chronic renal failure. During the admission with femur fracture in 12/13, she was told she had "fluid around her lungs." She was taken off bumetanide when she was admitted with dehydration in 5/14 and put on Lasix later on.   Last seen by Dr. Loralie Champagne in 12/2012.    She was admitted 6/21-6/25 with HTN urgency and UGI bleed 2/2 to erosive esophagitis (seen on EGD).  Diuretics were apparently held due to worsening renal function.  She also apparently had a hx of syncope and was to be set up with a Holter as an outpatient (according to the d/c summary).  She was readmitted 5/3-6/46 with a/c diastolic CHF.  She was diuresed with IV Lasix.  She returns for follow up.    She tells me that, since d/c, her LE edema is worse.  Her L leg is >> than her R leg.  This is unusual.  Her breathing is overall improved.  She is NYHA 2b. She denies orthopnea, PND.  She denies chest pain.  She denies recent syncope. She is worried about her kidney function and asked if I could refer her to Nephrology.  She tells me that she had 2 episodes of syncope in the last year.  The first was in the Fall of 2014.  She was at a funeral.  She had no warning.  She had another episode 3 mos ago.  She was at home and, again, had no warning.  She denies any injuries or falling.     Studies:   - Echo (11/30/13): - Left ventricle: The cavity size was normal. Wall thickness was increased in a pattern of mild LVH. Systolic function was normal. The estimated ejection fraction was  in the range of 55% to 60%. Wall motion was normal; there were no regional wall motion abnormalities. Doppler parameters are consistent with abnormal left ventricular relaxation (grade 1 diastolic dysfunction). - Aortic valve: There was mild stenosis. There was mild regurgitation. Valve area (VTI): 1.48 cm^2. Valve area (Vmax): 1.34 cm^2.  Peak gradient (S): 19 mm Hg - Mitral valve: Calcified annulus. Mildly thickened leaflets .  There was mild regurgitation. - Pericardium, extracardiac: A trivial pericardial effusion was identified.    Recent Labs: 11/29/2013: Pro B Natriuretic peptide (BNP) 4314.0*  11/30/2013: ALT 42*; Hemoglobin 10.3*  12/01/2013: HDL Cholesterol by NMR 34*; LDL (calc) 46; TSH 18.700*  12/02/2013: Creatinine 2.12*; Potassium 3.9   CXR (11/29/13): IMPRESSION:  Small moderate bilateral pleural effusions with associated lung base opacity. Lung base opacity is likely atelectasis. Pneumonia is possible. No pulmonary edema.   Wt Readings from Last 3 Encounters:  12/05/13 136 lb (61.689 kg)  12/02/13 136 lb 0.4 oz (61.7 kg)  11/20/13 138 lb (62.596 kg)     Past Medical History  Diagnosis Date  . Vitamin D deficiency   . Diverticulosis   . Coronary artery calcification seen on CAT scan   . TIA (transient ischemic attack)     Carotid  dopplers (8/11): 40-50% RICA stenosis. Carotid dopplers (12/13) with 40-59% bilateral ICA stenoses.   . CKD (chronic kidney disease)   . Inguinal hernia   . Broken hip     bilateral  . Hypertension   . Hyperlipidemia   . Hypothyroidism   . Senile osteoporosis   . Osteopenia   . Elevated hemoglobin A1c   . Bradycardia     Holter monitor (8/11) with no significant bradycardia, frequent PACs, few short runs of slow atrial tachycardia.   . Chronic diastolic heart failure     a. Echo (6/15):  EF 60-65%, Gr 2 DD, mild AS (mean 8 mmHg);  b.  Echo (11/2013):  mild LVH, EF 55-60%, no RWMA, Gr 1 DD, mild AS (peak 19 mmHg), mild MR, trivial eff     . Aortic stenosis     a. mild (echo 6/15 with mean gradient 8 mmHg)  . Carotid stenosis     a. Carotid US (04/2012):  bilateral ICA 40-59%    Current Outpatient Prescriptions  Medication Sig Dispense Refill  . allopurinol (ZYLOPRIM) 100 MG tablet Take 100 mg by mouth every morning.      Marland Kitchen amLODipine (NORVASC) 10 MG tablet Take 1 tablet (10 mg total) by mouth daily.  30 tablet  3  . aspirin EC 325 MG tablet Take 325 mg by mouth at bedtime.       Marland Kitchen atorvastatin (LIPITOR) 20 MG tablet Take 1 tablet (20 mg total) by mouth daily.  30 tablet  0  . Biotin 1000 MCG tablet Take 1,000 mcg by mouth every morning.       . bisoprolol (ZEBETA) 10 MG tablet Take 1 tablet (10 mg total) by mouth daily.  30 tablet  3  . cholecalciferol (VITAMIN D) 1000 UNITS tablet Take 1,000 Units by mouth every morning.       . cloNIDine (CATAPRES) 0.1 MG tablet Take 1 tablet (0.1 mg total) by mouth 3 (three) times daily.  60 tablet  11  . furosemide (LASIX) 40 MG tablet Take 1 tablet (40 mg total) by mouth daily.  30 tablet  0  . levothyroxine (SYNTHROID, LEVOTHROID) 175 MCG tablet Take 1 tablet (175 mcg total) by mouth daily before breakfast.  30 tablet  0  . Multiple Vitamin (MULTIVITAMIN WITH MINERALS) TABS Take 1 tablet by mouth every morning.       . pantoprazole (PROTONIX) 40 MG tablet Take 1 tablet (40 mg total) by mouth 2 (two) times daily before a meal.  60 tablet  3  . polyvinyl alcohol (LIQUIFILM TEARS) 1.4 % ophthalmic solution Place 1-2 drops into both eyes as needed for dry eyes.      . potassium chloride SA (K-DUR,KLOR-CON) 20 MEQ tablet Take 1 tablet (20 mEq total) by mouth daily.  20 tablet  0  . Propylene Glycol (SYSTANE BALANCE OP) Apply 1-2 drops to eye 3 (three) times daily.      . sodium chloride (MURO 128) 5 % ophthalmic solution Place 1 drop into both eyes at bedtime.      . traZODone (DESYREL) 25 mg TABS tablet Take 0.5 tablets (25 mg total) by mouth at bedtime as needed for sleep.  15 tablet  0    No current facility-administered medications for this visit.    Allergies:   Aricept; Prevacid; and Tylenol   Social History:  The patient  reports that she quit smoking about 42 years ago. Her smoking use included Cigarettes. She smoked 0.00 packs per day.  She has never used smokeless tobacco. She reports that she does not drink alcohol or use illicit drugs.   Family History:  The patient's family history includes CVA in her mother; Emphysema in her sister. There is no history of Colon cancer.   ROS:  Please see the history of present illness.   She is still somewhat constipated and still seeing black stools.  No change since d/c.    All other systems reviewed and negative.   PHYSICAL EXAM: VS:  BP 190/60  Pulse 57  Ht 5' 8.5" (1.74 m)  Wt 136 lb (61.689 kg)  BMI 20.38 kg/m2 Well nourished, well developed, in no acute distress HEENT: normal Neck: no JVD Cardiac:  normal S1, S2; RRR; 2/6 systolic murmur at RUSB Lungs:  Decreased breath sounds bilaterally, no wheezing, rhonchi or rales, no egophony  Abd: soft, nontender, no hepatomegaly Ext: 1+ bilateral LE edemaL >> R Skin: warm and dry Neuro:  CNs 2-12 intact, no focal abnormalities noted  EKG:  Sinus bradycardia, HR 57, normal axis, nonspecific ST-T wave changes     ASSESSMENT AND PLAN:  1. Chronic diastolic CHF (congestive heart failure):  She is still somewhat volume overloaded.  She used to be on a thiazide + loop diuretic.  Now, she is only on Lasix. I will increase Lasix to 40 bid x 3 days, then continue on Lasix 60 QD.  Check BMET today and repeat in 1 week.  2. Edema:  She has asymmetric edema (L >> R).  She tells me that she has never had this.  LE venous duplex will be arranged to r/o DVT (she has been hospitalized x 2 in the last month).  We were able to get this scheduled in our office this afternoon.   3. Essential hypertension:  BP is markedly elevated.  Increase Lasix as noted.  I will also increase Clonidine to  0.2 bid.  May need to consider adding Hydralazine.  May need to consider renal artery doppler to r/o RAS. 4. Syncope, unspecified syncope type:  Unexplained.  Symptoms are concerning for cardiogenic cause.  Risk of SCD is low given normal EF.  I am suspicious of a bradyarrhythmia.  Arrange event monitor.  Beta blocker and clonidine would have to be stopped if significant pause or bradyarrhythmia is identified. I have advised her to not drive x 6 mos after last episode of syncope.   5. CKD (chronic kidney disease), stage III:  Monitor renal function closely.  I had a long discussion with her today regarding the progression of chronic kidney disease b/c she told me that she had never been informed of this.  She requested that I refer her to Nephrology.   6. Carotid stenosis, bilateral:  Will need to arrange follow up Carotid US at next appointment.  7. Erosive gastritis:  She continues to note black stools.  Check CBC today.  8. Hyperlipidemia:   Continue statin. 9. Disposition:  F/u with Dr. Loralie Champagne or me in 2 weeks.   ADDENDUM: As the CMA was checking the patient out today, she refused to have her labs drawn or to have any other tests arranged/done as outlined above.  She did not schedule follow up and refused to have referral to nephrology.     Signed, Versie Starks, MHS 12/05/2013 3:04 PM    [facility] [place], [place], [place]  [address] Phone: [phone]; Fax: [phone]

## 2013-12-06 ENCOUNTER — Encounter: Payer: Self-pay | Admitting: Internal Medicine

## 2013-12-06 ENCOUNTER — Ambulatory Visit (INDEPENDENT_AMBULATORY_CARE_PROVIDER_SITE_OTHER): Payer: Medicare Other | Admitting: Internal Medicine

## 2013-12-06 ENCOUNTER — Telehealth: Payer: Self-pay

## 2013-12-06 VITALS — BP 196/82 | HR 64 | Temp 98.2°F | Resp 16 | Ht 68.5 in | Wt 138.2 lb

## 2013-12-06 DIAGNOSIS — I10 Essential (primary) hypertension: Secondary | ICD-10-CM

## 2013-12-06 DIAGNOSIS — R609 Edema, unspecified: Secondary | ICD-10-CM

## 2013-12-06 DIAGNOSIS — E039 Hypothyroidism, unspecified: Secondary | ICD-10-CM

## 2013-12-06 MED ORDER — CLONIDINE HCL 0.2 MG PO TABS: 0.2000 mg | ORAL_TABLET | Freq: Three times a day (TID) | ORAL | Status: DC

## 2013-12-06 NOTE — Progress Notes (Signed)
Subjective:    Patient ID: Rachael [name], female    DOB: 1923-09-20, 80 y.o.   MRN: [id number]  HPI  Patient is an 80 yo WWF recently hospitalized  July 7-12 with Acute on Chronic CHF (BNP 4314) and Lasix was re-added and she was  diuresed ~ 10 #  with CKD remaining stable & actually improving slightly with BUN/Creat 43/1.98 and GFR 25 ml/min sl improved over recent values. She was also found Hypothyroid with elev TSH 18.7 and her Thyroxine dose was increased to 175 mcg/day. Yesterday she was seen at [facility] by Richardson Dopp who recommended Venous dopplers to evaluate assymetric dependent edema - L>R and at check-out patient refused to have the studies. Patient does admit some DOE with rapid gait, but denies any current orthopnea for PND.    Medication List   allopurinol 100 MG tablet  Commonly known as:  ZYLOPRIM  Take 100 mg by mouth every morning.     amLODipine 10 MG tablet  Commonly known as:  NORVASC  Take 1 tablet (10 mg total) by mouth daily.     aspirin EC 325 MG tablet  Take 325 mg by mouth at bedtime.     atorvastatin 20 MG tablet  Commonly known as:  LIPITOR  Take 1 tablet (20 mg total) by mouth daily.     Biotin 1000 MCG tablet  Take 1,000 mcg by mouth every morning.     bisoprolol 10 MG tablet  Commonly known as:  ZEBETA  Take 1 tablet (10 mg total) by mouth daily.     cholecalciferol 1000 UNITS tablet  Commonly known as:  VITAMIN D  Take 1,000 Units by mouth every morning.     cloNIDine 0.2 MG tablet  Commonly known as:  CATAPRES  Take 1 tablet (0.2 mg total) by mouth 3 (three) times daily.     furosemide 40 MG tablet  Commonly known as:  LASIX  Take 1 tablet (40 mg total) by mouth daily. 40 MG BID for three days then decrease to 60 MG daily     levothyroxine 175 MCG tablet  Commonly known as:  SYNTHROID, LEVOTHROID  Take 1 tablet (175 mcg total) by mouth daily before breakfast.     multivitamin with minerals Tabs tablet  Take 1 tablet by mouth  every morning.     pantoprazole 40 MG tablet  Commonly known as:  PROTONIX  Take 1 tablet (40 mg total) by mouth 2 (two) times daily before a meal.     polyvinyl alcohol 1.4 % ophthalmic solution  Commonly known as:  LIQUIFILM TEARS  Place 1-2 drops into both eyes as needed for dry eyes.     potassium chloride SA 20 MEQ tablet  Commonly known as:  K-DUR,KLOR-CON  Take 1 tablet (20 mEq total) by mouth daily.     sodium chloride 5 % ophthalmic solution  Commonly known as:  MURO 128  Place 1 drop into both eyes at bedtime.     SYSTANE BALANCE OP  Apply 1-2 drops to eye 3 (three) times daily.     traZODone 25 mg Tabs tablet  Commonly known as:  DESYREL  Take 0.5 tablets (25 mg total) by mouth at bedtime as needed for sleep.     Allergies  Allergen Reactions  . Aricept [Donepezil Hcl]     Nausea and vomiting  . Prevacid [Lansoprazole]     GI upset  . Tylenol [Acetaminophen]     Tylenol #3 - Nausea  Past Medical History  Diagnosis Date  . Vitamin D deficiency   . Diverticulosis   . Coronary artery calcification seen on CAT scan   . TIA (transient ischemic attack)     Carotid dopplers (8/11): 40-50% RICA stenosis. Carotid dopplers (12/13) with 40-59% bilateral ICA stenoses.   . CKD (chronic kidney disease)   . Inguinal hernia   . Broken hip     bilateral  . Hypertension   . Hyperlipidemia   . Hypothyroidism   . Senile osteoporosis   . Osteopenia   . Elevated hemoglobin A1c   . Bradycardia     Holter monitor (8/11) with no significant bradycardia, frequent PACs, few short runs of slow atrial tachycardia.   . Chronic diastolic heart failure     a. Echo (6/15):  EF 60-65%, Gr 2 DD, mild AS (mean 8 mmHg);  b.  Echo (11/2013):  mild LVH, EF 55-60%, no RWMA, Gr 1 DD, mild AS (peak 19 mmHg), mild MR, trivial eff   . Aortic stenosis     a. mild (echo 6/15 with mean gradient 8 mmHg)  . Carotid stenosis     a. Carotid US (04/2012):  bilateral ICA 40-59%   Review of Systems   In addition to the HPI above,  No Fever-chills,  No Headache, No changes with Vision or hearing,  No problems swallowing food or Liquids,  No Chest pain or productive Cough,  No Abdominal pain, No Nausea or Vomitting, Bowel movements are regular,  No Blood in stool or Urine,  No dysuria,  No new skin rashes or bruises,  No new joints pains-aches,  No new weakness, tingling, numbness in any extremity,   No polyuria, polydypsia or polyphagia, .  A full 10 point Review of Systems was done, except as stated above, all other Review of Systems were negative  Objective:   Physical Exam  BP 196/82  Pulse 64  Temp(Src) 98.2 F (36.8 C) (Temporal)  Resp 16  Ht 5' 8.5" (1.74 m)  Wt 138 lb 3.2 oz (62.687 kg)  BMI 20.71 kg/m2 BP rechecked  At 180/65 - pulse 66/min  HEENT - Eac's patent. TM's Nl. EOM's full. PERRLA. NasoOroPharynx clear. Neck - supple. Nl Thyroid. Carotids 2+ & No bruits, nodes, JVD Chest - Clear equal BS w/o Rales, rhonchi, wheezes. Cor - Nl HS. RRR w/o sig MGR. PP obscured by assym pretibial edema L.>R. Homan's sign - Neg - Calves soft with no palpable cords. Abd - No palpable organomegaly, masses or tenderness. BS nl. MS- FROM w/o deformities. Muscle power, tone and bulk decreased. Gait supported by a cane. Neuro - No obvious Cr N abnormalities. Sensory, motor and Cerebellar functions appear Nl w/o focal abnormalities. Psyche - Mental status finds patient somewhat irritable and critical today.  No delusions or  Ideations.  Assessment & Plan:   1. Hypertension - Recc decrease Amlodipine 10 mg to 1/2 tab = 5 mg qd and increase - cloNIDine 0.2 MG  3  times daily.  2. Edema ? Chronic Diastolic Ht Failure  - ROV 8-10 days and recheck BMET and TSH  - Patient was advised and encouraged to contact [facility] to schedule the recommended venous dopplers.

## 2013-12-06 NOTE — Patient Instructions (Addendum)
  Decrease Norvasc (Amlodipine) 10 mg to 1/2 tablet = 5 mg daily   Increase clonidine 0.1 mg - 3x day to  2 tablets Clonidine 0.1 mg (=0.2) mg - 3 x day   Til use up  Then start new Rx Clonidine 0.2 mg tablets - 3 x day

## 2013-12-06 NOTE — Telephone Encounter (Signed)
Verdis Frederickson at [facility] called requesting verbal orders to extend home health for 6 weeks. Per Dr.McKeown, ok to extend home health. Rachael [name] aware

## 2013-12-06 NOTE — Progress Notes (Deleted)
Patient ID: Rachael [name], female   DOB: 05-09-1924, 80 y.o.   MRN: [id number]

## 2013-12-10 ENCOUNTER — Other Ambulatory Visit (HOSPITAL_COMMUNITY): Payer: Self-pay | Admitting: Cardiology

## 2013-12-10 DIAGNOSIS — M7989 Other specified soft tissue disorders: Secondary | ICD-10-CM

## 2013-12-11 ENCOUNTER — Ambulatory Visit (HOSPITAL_COMMUNITY): Payer: Medicare Other | Attending: Cardiovascular Disease | Admitting: Radiology

## 2013-12-11 DIAGNOSIS — M7989 Other specified soft tissue disorders: Secondary | ICD-10-CM | POA: Insufficient documentation

## 2013-12-11 DIAGNOSIS — R0609 Other forms of dyspnea: Secondary | ICD-10-CM | POA: Insufficient documentation

## 2013-12-11 DIAGNOSIS — I10 Essential (primary) hypertension: Secondary | ICD-10-CM | POA: Insufficient documentation

## 2013-12-11 DIAGNOSIS — R609 Edema, unspecified: Secondary | ICD-10-CM

## 2013-12-11 DIAGNOSIS — R0989 Other specified symptoms and signs involving the circulatory and respiratory systems: Secondary | ICD-10-CM | POA: Insufficient documentation

## 2013-12-11 DIAGNOSIS — Z8673 Personal history of transient ischemic attack (TIA), and cerebral infarction without residual deficits: Secondary | ICD-10-CM | POA: Diagnosis not present

## 2013-12-11 NOTE — Progress Notes (Signed)
Bilateral lower extremity venous Duplex performed.

## 2013-12-13 ENCOUNTER — Telehealth: Payer: Self-pay | Admitting: *Deleted

## 2013-12-13 NOTE — Telephone Encounter (Signed)
pt notified about LE Venous doppler no DVT, pt said ok and thank you

## 2013-12-13 NOTE — Telephone Encounter (Signed)
lmptcb for LE Venous doppler results

## 2013-12-19 ENCOUNTER — Other Ambulatory Visit: Payer: Self-pay

## 2013-12-19 ENCOUNTER — Ambulatory Visit (INDEPENDENT_AMBULATORY_CARE_PROVIDER_SITE_OTHER): Payer: Medicare Other | Admitting: Internal Medicine

## 2013-12-19 ENCOUNTER — Encounter: Payer: Self-pay | Admitting: Internal Medicine

## 2013-12-19 VITALS — BP 158/62 | HR 60 | Temp 99.0°F | Resp 16 | Ht 68.5 in | Wt 137.4 lb

## 2013-12-19 DIAGNOSIS — L03119 Cellulitis of unspecified part of limb: Secondary | ICD-10-CM

## 2013-12-19 DIAGNOSIS — I150 Renovascular hypertension: Secondary | ICD-10-CM

## 2013-12-19 DIAGNOSIS — N2889 Other specified disorders of kidney and ureter: Principal | ICD-10-CM

## 2013-12-19 DIAGNOSIS — I151 Hypertension secondary to other renal disorders: Secondary | ICD-10-CM

## 2013-12-19 DIAGNOSIS — Z79899 Other long term (current) drug therapy: Secondary | ICD-10-CM

## 2013-12-19 DIAGNOSIS — R609 Edema, unspecified: Secondary | ICD-10-CM

## 2013-12-19 DIAGNOSIS — L03116 Cellulitis of left lower limb: Secondary | ICD-10-CM

## 2013-12-19 DIAGNOSIS — L02419 Cutaneous abscess of limb, unspecified: Secondary | ICD-10-CM

## 2013-12-19 LAB — BASIC METABOLIC PANEL WITH GFR
BUN: 28 mg/dL — ABNORMAL HIGH (ref 6–23)
CHLORIDE: 103 meq/L (ref 96–112)
CO2: 27 meq/L (ref 19–32)
Calcium: 9.1 mg/dL (ref 8.4–10.5)
Creat: 1.45 mg/dL — ABNORMAL HIGH (ref 0.50–1.10)
GFR, Est African American: 37 mL/min — ABNORMAL LOW
GFR, Est Non African American: 32 mL/min — ABNORMAL LOW
GLUCOSE: 123 mg/dL — AB (ref 70–99)
POTASSIUM: 4.1 meq/L (ref 3.5–5.3)
Sodium: 140 mEq/L (ref 135–145)

## 2013-12-19 MED ORDER — POTASSIUM CHLORIDE CRYS ER 20 MEQ PO TBCR: 20.0000 meq | EXTENDED_RELEASE_TABLET | Freq: Every day | ORAL | Status: DC

## 2013-12-19 MED ORDER — FUROSEMIDE 40 MG PO TABS: ORAL_TABLET | ORAL | Status: DC

## 2013-12-19 MED ORDER — CEPHALEXIN 500 MG PO CAPS: ORAL_CAPSULE | ORAL | Status: DC

## 2013-12-19 NOTE — Progress Notes (Signed)
Subjective:    Patient ID: Rachael [name], female    DOB: 12-31-1923, 80 y.o.   MRN: [id number]  HPI Patient returns today for f/u of seemingly refractory edema and new apparent cellulitis of RLE. After last OV her Amlodipine wa 1/2'd and she was advised to increase Lasix (which apparently never happened). She has only diuresed ~ 1# since last OV 1 week ago.    Medication List   allopurinol 100 MG tablet  Commonly known as:  ZYLOPRIM  Take 100 mg by mouth every morning.     aspirin EC 325 MG tablet  Take 325 mg by mouth at bedtime.     atorvastatin 20 MG tablet  Commonly known as:  LIPITOR  Take 1 tablet (20 mg total) by mouth daily.     Biotin 1000 MCG tablet  Take 1,000 mcg by mouth every morning.     bisoprolol 10 MG tablet  Commonly known as:  ZEBETA  Take 1 tablet (10 mg total) by mouth daily.     cephALEXin 500 MG capsule  Commonly known as:  KEFLEX  Take 1 capsule 3 x day after meals for skin infection     cholecalciferol 1000 UNITS tablet  Commonly known as:  VITAMIN D  Take 1,000 Units by mouth every morning.     cloNIDine 0.2 MG tablet  Commonly known as:  CATAPRES  Take 1 tablet (0.2 mg total) by mouth 3 (three) times daily.     furosemide 40 MG tablet  Commonly known as:  LASIX  Take 1 & 1/2 tablets (= 60 mg ) twice daily - morning & night     levothyroxine 175 MCG tablet  Commonly known as:  SYNTHROID, LEVOTHROID  Take 1 tablet (175 mcg total) by mouth daily before breakfast.     multivitamin with minerals Tabs tablet  Take 1 tablet by mouth every morning.     pantoprazole 40 MG tablet  Commonly known as:  PROTONIX  Take 1 tablet (40 mg total) by mouth 2 (two) times daily before a meal.     polyvinyl alcohol 1.4 % ophthalmic solution  Commonly known as:  LIQUIFILM TEARS  Place 1-2 drops into both eyes as needed for dry eyes.     potassium chloride SA 20 MEQ tablet  Commonly known as:  K-DUR,KLOR-CON  Take 1 tablet (20 mEq total) by mouth  daily.     sodium chloride 5 % ophthalmic solution  Commonly known as:  MURO 128  Place 1 drop into both eyes at bedtime.     SYSTANE BALANCE OP  Apply 1-2 drops to eye 3 (three) times daily.     traZODone 25 mg Tabs tablet  Commonly known as:  DESYREL  Take 0.5 tablets (25 mg total) by mouth at bedtime as needed for sleep.     Allergies  Allergen Reactions  . Aricept [Donepezil Hcl]     Nausea and vomiting  . Prevacid [Lansoprazole]     GI upset  . Tylenol [Acetaminophen]     Tylenol #3 - Nausea   Review of Systems In addition to the HPI above,  Has  Fever - No chills,  No Headache, No changes with Vision or hearing,  No problems swallowing food or Liquids,  No Chest pain or productive Cough or Shortness of Breath,  No Abdominal pain, No Nausea or Vomitting, Bowel movements are regular,  No Blood in stool or Urine,  No dysuria,  Has new skin rashes rt. Popliteal  area,  No new joints pains-aches,  No new weakness, tingling, numbness in any extremity,  No recent weight loss,  No polyuria, polydypsia or polyphagia. A full 10 point Review of Systems was done, except as stated above, all other Review of Systems were negative  Objective:   Physical Exam BP 158/62  P 60  T 99 F   R 16  Ht 5' 8.5"   Wt 137 lb 6.4 oz   BMI 20.59 kg/m2  HEENT - Eac's patent. TM's Nl. EOM's full. PERRLA. NasoOroPharynx clear. Neck - supple. Nl Thyroid. Carotids 2+ & No bruits, nodes, JVD Chest - Clear equal BS w/o Rales, rhonchi, wheezes. Cor - Nl HS. RRR w/o sig MGR. PP 1(+). 2-3 (+) ankle/pretibial edema. Abd - No palpable organomegaly, masses or tenderness. BS nl. MS- FROM w/o deformities. Muscle power, tone and bulk decreased with generalized muscular wasting. Gait Nl. Neuro - No obvious Cr N abnormalities. Sensory, motor and Cerebellar functions appear Nl w/o focal abnormalities. Psyche - Mental status normal & appropriate.  No delusions, ideations or obvious mood  abnormalities. Skin - ~ 4" x 4" area of non- tender warm erythematous skin along the lateral Left posterior popliteal area at the upper  margin of her compression stockings. No signs of lymphangitis. No calf tenderness.  Assessment & Plan:   1. Hypertension  2. Edema - d/c Amlodipine completely - furosemide (LASIX) 40 MG tablet; Take 1 & 1/2 tablets (= 60 mg ) twice daily - morning & night  Dispense: 90 tablet; Refill: 99- may need to add Zaroxolyn.  3. Cellulitis of left lower extremity - cephALEXin (KEFLEX) 500 MG capsule; Take 1 capsule 3 x day after meals for skin infection  Dispense: 42 capsule; Refill: 0  4. Encounter for long-term (current) use of other medications  - BASIC METABOLIC PANEL WITH GFR

## 2013-12-19 NOTE — Patient Instructions (Signed)
  Stop Norvasc (Amlodipine)  ++++++++++++++++++++++++++++++  Take Lasix (Furosemide) 40 mg x 1 & 1/2 tablets  (= 60 mg)  Twice daily   Morning & Night

## 2013-12-20 ENCOUNTER — Ambulatory Visit (INDEPENDENT_AMBULATORY_CARE_PROVIDER_SITE_OTHER): Payer: Medicare Other | Admitting: Physician Assistant

## 2013-12-20 ENCOUNTER — Encounter: Payer: Self-pay | Admitting: Physician Assistant

## 2013-12-20 ENCOUNTER — Ambulatory Visit: Payer: Self-pay | Admitting: Internal Medicine

## 2013-12-20 VITALS — BP 152/58 | HR 61 | Ht 68.5 in | Wt 135.0 lb

## 2013-12-20 DIAGNOSIS — I6529 Occlusion and stenosis of unspecified carotid artery: Secondary | ICD-10-CM

## 2013-12-20 DIAGNOSIS — N183 Chronic kidney disease, stage 3 unspecified: Secondary | ICD-10-CM

## 2013-12-20 DIAGNOSIS — I5032 Chronic diastolic (congestive) heart failure: Secondary | ICD-10-CM

## 2013-12-20 DIAGNOSIS — R55 Syncope and collapse: Secondary | ICD-10-CM

## 2013-12-20 DIAGNOSIS — I2510 Atherosclerotic heart disease of native coronary artery without angina pectoris: Secondary | ICD-10-CM

## 2013-12-20 DIAGNOSIS — R609 Edema, unspecified: Secondary | ICD-10-CM

## 2013-12-20 DIAGNOSIS — R0602 Shortness of breath: Secondary | ICD-10-CM

## 2013-12-20 DIAGNOSIS — I509 Heart failure, unspecified: Secondary | ICD-10-CM

## 2013-12-20 DIAGNOSIS — E785 Hyperlipidemia, unspecified: Secondary | ICD-10-CM

## 2013-12-20 MED ORDER — FUROSEMIDE 40 MG PO TABS
ORAL_TABLET | ORAL | Status: DC
Start: 2013-12-20 — End: 2014-06-28

## 2013-12-20 NOTE — Progress Notes (Signed)
Cardiology Office Note    Date:  12/20/2013   ID:  ADALY PUDER, DOB 1924/04/03, MRN [id number]  PCP:  Alesia Richards, MD  Cardiologist:  Dr. Loralie Champagne      History of Present Illness: Rachael [name] is a 80 y.o. female with a hx of HTN, CKD, TIAs, and chronic diastolic. She fractured both of her femurs in 2013 (once in 11/13 and once in 12/13). She had surgery both times. She was then admitted in 5/14 with dehydration and acute on chronic renal failure. During the admission with femur fracture in 12/13, she was told she had "fluid around her lungs." She was taken off bumetanide when she was admitted with dehydration in 5/14 and put on Lasix later on.   Last seen by Dr. Loralie Champagne in 12/2012.    She was admitted 10/2013 with HTN urgency and UGI bleed 2/2 to erosive esophagitis (seen on EGD).  Diuretics were apparently held due to worsening renal function.  She also apparently had a hx of syncope and was to be set up with a Holter as an outpatient (according to the d/c summary).  She was readmitted 01/2329 with a/c diastolic CHF.  She was diuresed with IV Lasix.    I saw her 12/05/13 for follow up.  She felt her LE edema was worse (L>R).  she requested a referral to nephrology.  She described 2 episodes of syncope over 12 mos.  She had no warning.  I recommended adjusting her Lasix, getting venous US to r/o DVT, and an event monitor to work up her syncope.  I recommended no driving for 6 mos after her last episode of syncope.  I increased her Clonidine b/c her BP was high.  I referred her to Nephrology as she requested.  The patient then left the office before anything was done or scheduled.    Saw PCP x 2 since then. Taken off of Amlodipine for edema.  She did have venous US 7/22 that was neg for DVT.    She returns for f/u.  L leg edema is worse.  Her weights are stable. Denies worse SOB.  She is NYHA 2-2b.  Denies orthopnea, PND.  Denies chest pain. No syncope.     Studies:   - Echo (11/30/13): - Left ventricle: The cavity size was normal. Wall thickness was increased in a pattern of mild LVH. Systolic function was normal. The estimated ejection fraction was in the range of 55% to 60%. Wall motion was normal; there were no regional wall motion abnormalities. Doppler parameters are consistent with abnormal left ventricular relaxation (grade 1 diastolic dysfunction). - Aortic valve: There was mild stenosis. There was mild regurgitation. Valve area (VTI): 1.48 cm^2. Valve area (Vmax): 1.34 cm^2.  Peak gradient (S): 19 mm Hg - Mitral valve: Calcified annulus. Mildly thickened leaflets .  There was mild regurgitation. - Pericardium, extracardiac: A trivial pericardial effusion was identified.    Recent Labs: 11/29/2013: Pro B Natriuretic peptide (BNP) 4314.0*  11/30/2013: ALT 42*; Hemoglobin 10.3*  12/01/2013: HDL Cholesterol by NMR 34*; LDL (calc) 46; TSH 18.700*  12/19/2013: Creatinine 1.45*; Potassium 4.1    Wt Readings from Last 3 Encounters:  12/20/13 135 lb (61.236 kg)  12/19/13 137 lb 6.4 oz (62.324 kg)  12/06/13 138 lb 3.2 oz (62.687 kg)     Past Medical History  Diagnosis Date  . Vitamin D deficiency   . Diverticulosis   . Coronary artery calcification seen on CAT scan   .  TIA (transient ischemic attack)     Carotid dopplers (8/11): 40-50% RICA stenosis. Carotid dopplers (12/13) with 40-59% bilateral ICA stenoses.   . CKD (chronic kidney disease)   . Inguinal hernia   . Broken hip     bilateral  . Hypertension   . Hyperlipidemia   . Hypothyroidism   . Senile osteoporosis   . Osteopenia   . Elevated hemoglobin A1c   . Bradycardia     Holter monitor (8/11) with no significant bradycardia, frequent PACs, few short runs of slow atrial tachycardia.   . Chronic diastolic heart failure     a. Echo (6/15):  EF 60-65%, Gr 2 DD, mild AS (mean 8 mmHg);  b.  Echo (11/2013):  mild LVH, EF 55-60%, no RWMA, Gr 1 DD, mild AS (peak 19 mmHg), mild  MR, trivial eff   . Aortic stenosis     a. mild (echo 6/15 with mean gradient 8 mmHg)  . Carotid stenosis     a. Carotid US (04/2012):  bilateral ICA 40-59%    Current Outpatient Prescriptions  Medication Sig Dispense Refill  . allopurinol (ZYLOPRIM) 100 MG tablet Take 100 mg by mouth every morning.      Marland Kitchen aspirin EC 325 MG tablet Take 325 mg by mouth at bedtime.       Marland Kitchen atorvastatin (LIPITOR) 20 MG tablet Take 1 tablet (20 mg total) by mouth daily.  30 tablet  0  . Biotin 1000 MCG tablet Take 1,000 mcg by mouth every morning.       . bisoprolol (ZEBETA) 10 MG tablet Take 1 tablet (10 mg total) by mouth daily.  30 tablet  3  . cephALEXin (KEFLEX) 500 MG capsule Take 1 capsule 3 x day after meals for skin infection  42 capsule  0  . cholecalciferol (VITAMIN D) 1000 UNITS tablet Take 1,000 Units by mouth every morning.       . cloNIDine (CATAPRES) 0.2 MG tablet Take 1 tablet (0.2 mg total) by mouth 3 (three) times daily.  90 tablet  99  . furosemide (LASIX) 40 MG tablet Take 1 & 1/2 tablets (= 60 mg ) twice daily - morning & night  90 tablet  99  . levothyroxine (SYNTHROID, LEVOTHROID) 175 MCG tablet Take 1 tablet (175 mcg total) by mouth daily before breakfast.  30 tablet  0  . Multiple Vitamin (MULTIVITAMIN WITH MINERALS) TABS Take 1 tablet by mouth every morning.       . pantoprazole (PROTONIX) 40 MG tablet Take 1 tablet (40 mg total) by mouth 2 (two) times daily before a meal.  60 tablet  3  . polyvinyl alcohol (LIQUIFILM TEARS) 1.4 % ophthalmic solution Place 1-2 drops into both eyes as needed for dry eyes.      . potassium chloride SA (K-DUR,KLOR-CON) 20 MEQ tablet Take 1 tablet (20 mEq total) by mouth daily.  30 tablet  3  . Propylene Glycol (SYSTANE BALANCE OP) Apply 1-2 drops to eye 3 (three) times daily.      . sodium chloride (MURO 128) 5 % ophthalmic solution Place 1 drop into both eyes at bedtime.      . traZODone (DESYREL) 25 mg TABS tablet Take 0.5 tablets (25 mg total) by mouth  at bedtime as needed for sleep.  15 tablet  0   No current facility-administered medications for this visit.    Allergies:   Aricept; Prevacid; and Tylenol   Social History:  The patient  reports that she quit  smoking about 42 years ago. Her smoking use included Cigarettes. She smoked 0.00 packs per day. She has never used smokeless tobacco. She reports that she does not drink alcohol or use illicit drugs.   Family History:  The patient's family history includes CVA in her mother; Emphysema in her sister; Other in an other family member. There is no history of Colon cancer.   ROS:  Please see the history of present illness.      All other systems reviewed and negative.   PHYSICAL EXAM: VS:  BP 152/58  Pulse 61  Ht 5' 8.5" (1.74 m)  Wt 135 lb (61.236 kg)  BMI 20.23 kg/m2 Well nourished, well developed, in no acute distress HEENT: normal Neck: no JVD Cardiac:  normal S1, S2; RRR; 2/6 systolic murmur at RUSB Lungs:  Decreased breath sounds bilaterally, no wheezing, rhonchi or rales, no egophony  Abd: soft, nontender, no hepatomegaly Ext: 1+ RLE edemabrawny 2+ LLE edema Skin: warm and dry Neuro:  CNs 2-12 intact, no focal abnormalities noted  EKG:   NSR, HR 61, no ST changes     ASSESSMENT AND PLAN:  Edema:  She has asymmetric edema.  Venous duplex was neg for DVT.  Etiology not clear.  Suspect multifactorial.  Recent TSH high.  I have asked her to make sure she is taking synthroid correctly and to follow up with PCP.  She does have some evidence of protein calorie malnutrition which may be contributing. MCKEOWN,WILLIAM DAVID, MD recently stopped her Amlodipine.  This also may have been contributing.  I will adjust her diuretic further by increasing Lasix to 80 bid. Check BMET and BNP next week.  If edema does not change at all, consider abdominal and pelvic CT to rule out compression of femoral vein (doubt this).  I will give her a Rx for compression stockings.    Chronic diastolic  CHF (congestive heart failure):  Adjust lasix as noted.  Consider Demadex if edema does not improve.   Essential hypertension:  BP improved.  Continue to monitor.  Syncope, unspecified syncope type:  Unexplained.  Symptoms are concerning for cardiogenic cause.  Risk of SCD is low given normal EF.  I am suspicious of a bradyarrhythmia.  Arrange event monitor.  Beta blocker and clonidine would have to be stopped if significant pause or bradyarrhythmia is identified. I have advised her to not drive x 6 mos after last episode of syncope.    CKD (chronic kidney disease), stage III:  Refer her to Nephrology per her request.    Carotid stenosis, bilateral:  Arrange follow up Carotid US  Hyperlipidemia:   Continue statin.  Disposition:  F/u with Dr. Loralie Champagne 6 weeks.   Signed, Versie Starks, MHS 12/20/2013 3:38 PM    [facility] [place], [place], [place]  [id number] Phone: [phone]; Fax: [phone]

## 2013-12-20 NOTE — Patient Instructions (Addendum)
INCREASE LASIX TO 80 MG TWICE DAILY; NEW RX SENT IN TODAY TO [facility]  LAB WORK TO BE DONE IN 1 WEEK ; BMET, BNP  You have been referred to NEPHROLOGY; DX CKD STAGE 3  NO DRIVING FOR 6 MONTHS FROM YOUR SYNCOPE EPISODE IN April 2015; NO DRIVING UNTIL 46/28/63  MAKE SURE YOU ARE TAKING YOUR THYROID MEDICATION ON AN EMPTY STOMACH ; TAKE 30-60 MINUTES BEFORE BREAKFAST  PER SCOTT WEAVER, PAC YOU NEED TO FOLLOW UP WITH PRIMARY CARE ABOUT THYROID  Your physician has recommended that you wear an event monitor. Event monitors are medical devices that record the heart's electrical activity. Doctors most often [place] these monitors to diagnose arrhythmias. Arrhythmias are problems with the speed or rhythm of the heartbeat. The monitor is a small, portable device. You can wear one while you do your normal daily activities. This is usually used to diagnose what is causing palpitations/syncope (passing out).  Your physician has requested that you have a carotid duplex. This test is an ultrasound of the carotid arteries in your neck. It looks at blood flow through these arteries that supply the brain with blood. Allow one hour for this exam. There are no restrictions or special instructions.  YOU HAVE BEEN GIVEN AN ORDER FOR COMPRESSION STOCKINGS  Your physician recommends that you schedule a follow-up appointment in: [place] DR. Aundra Dubin

## 2013-12-21 ENCOUNTER — Telehealth: Payer: Self-pay | Admitting: *Deleted

## 2013-12-21 ENCOUNTER — Telehealth: Payer: Self-pay | Admitting: Physician Assistant

## 2013-12-21 NOTE — Telephone Encounter (Signed)
New message     Pt saw Nicki Reaper a couple of days ago.  He gave her a presc for compression hose and she cannot find them.  The medical supply store on [address] does not have them.  Where else can she buy them?

## 2013-12-21 NOTE — Telephone Encounter (Signed)
Patient called and stated that the cardiologist increased her Lasix to 80 mg daily. She states she was up urinating from 4AM to 6AM and was concerned.  Per Dr Melford Aase, increased dose OK and increase in output is good.

## 2013-12-24 ENCOUNTER — Encounter: Payer: Self-pay | Admitting: *Deleted

## 2013-12-24 ENCOUNTER — Other Ambulatory Visit: Payer: PRIVATE HEALTH INSURANCE

## 2013-12-24 ENCOUNTER — Encounter (INDEPENDENT_AMBULATORY_CARE_PROVIDER_SITE_OTHER): Payer: Medicare Other

## 2013-12-24 ENCOUNTER — Ambulatory Visit (HOSPITAL_COMMUNITY): Payer: Medicare Other | Attending: Cardiology | Admitting: Radiology

## 2013-12-24 DIAGNOSIS — I6529 Occlusion and stenosis of unspecified carotid artery: Secondary | ICD-10-CM | POA: Diagnosis present

## 2013-12-24 DIAGNOSIS — I10 Essential (primary) hypertension: Secondary | ICD-10-CM | POA: Diagnosis not present

## 2013-12-24 DIAGNOSIS — Z8673 Personal history of transient ischemic attack (TIA), and cerebral infarction without residual deficits: Secondary | ICD-10-CM | POA: Insufficient documentation

## 2013-12-24 DIAGNOSIS — R55 Syncope and collapse: Secondary | ICD-10-CM

## 2013-12-24 NOTE — Progress Notes (Signed)
Carotid artery Duplex performed.

## 2013-12-24 NOTE — Progress Notes (Signed)
Patient ID: Rachael [name], female   DOB: 1923/10/30, 80 y.o.   MRN: [id number] [employer] 30 day cardiac event monitor applied to patient.

## 2013-12-24 NOTE — Telephone Encounter (Signed)
I cb pt to let her know that a new Rx for compression hose will be at the front desk today, so when she comes in for appt today she can pick new Rx as well. Pt said thank you for all the help. I said no problem my pleasure.

## 2013-12-26 ENCOUNTER — Encounter: Payer: Self-pay | Admitting: Physician Assistant

## 2013-12-28 ENCOUNTER — Other Ambulatory Visit (INDEPENDENT_AMBULATORY_CARE_PROVIDER_SITE_OTHER): Payer: Medicare Other

## 2013-12-28 DIAGNOSIS — R0602 Shortness of breath: Secondary | ICD-10-CM

## 2013-12-28 DIAGNOSIS — N183 Chronic kidney disease, stage 3 unspecified: Secondary | ICD-10-CM

## 2013-12-28 LAB — BASIC METABOLIC PANEL
BUN: 42 mg/dL — AB (ref 6–23)
CO2: 33 meq/L — AB (ref 19–32)
Calcium: 9.4 mg/dL (ref 8.4–10.5)
Chloride: 94 mEq/L — ABNORMAL LOW (ref 96–112)
Creatinine, Ser: 1.7 mg/dL — ABNORMAL HIGH (ref 0.4–1.2)
GFR: 29.94 mL/min — AB (ref >60.00)
GLUCOSE: 93 mg/dL (ref 70–99)
POTASSIUM: 3.5 meq/L (ref 3.5–5.1)
Sodium: 137 mEq/L (ref 135–145)

## 2013-12-28 LAB — BRAIN NATRIURETIC PEPTIDE: Pro B Natriuretic peptide (BNP): 64 pg/mL (ref 0.0–100.0)

## 2013-12-31 ENCOUNTER — Other Ambulatory Visit: Payer: Self-pay | Admitting: *Deleted

## 2013-12-31 ENCOUNTER — Ambulatory Visit (INDEPENDENT_AMBULATORY_CARE_PROVIDER_SITE_OTHER): Payer: Medicare Other | Admitting: Internal Medicine

## 2013-12-31 ENCOUNTER — Encounter: Payer: Self-pay | Admitting: Internal Medicine

## 2013-12-31 VITALS — BP 158/70 | HR 64 | Temp 97.9°F | Resp 16 | Ht 68.5 in | Wt 123.8 lb

## 2013-12-31 DIAGNOSIS — I150 Renovascular hypertension: Secondary | ICD-10-CM

## 2013-12-31 DIAGNOSIS — N2889 Other specified disorders of kidney and ureter: Principal | ICD-10-CM

## 2013-12-31 DIAGNOSIS — Z79899 Other long term (current) drug therapy: Secondary | ICD-10-CM

## 2013-12-31 DIAGNOSIS — R609 Edema, unspecified: Secondary | ICD-10-CM

## 2013-12-31 DIAGNOSIS — E039 Hypothyroidism, unspecified: Secondary | ICD-10-CM

## 2013-12-31 DIAGNOSIS — E559 Vitamin D deficiency, unspecified: Secondary | ICD-10-CM

## 2013-12-31 DIAGNOSIS — I151 Hypertension secondary to other renal disorders: Secondary | ICD-10-CM

## 2013-12-31 LAB — CBC WITH DIFFERENTIAL/PLATELET
Basophils Absolute: 0.1 10*3/uL (ref 0.0–0.1)
Basophils Relative: 1 % (ref 0–1)
EOS PCT: 2 % (ref 0–5)
Eosinophils Absolute: 0.1 10*3/uL (ref 0.0–0.7)
HEMATOCRIT: 33.2 % — AB (ref 36.0–46.0)
Hemoglobin: 11.2 g/dL — ABNORMAL LOW (ref 12.0–15.0)
LYMPHS ABS: 1.7 10*3/uL (ref 0.7–4.0)
Lymphocytes Relative: 24 % (ref 12–46)
MCH: 28.8 pg (ref 26.0–34.0)
MCHC: 33.7 g/dL (ref 30.0–36.0)
MCV: 85.3 fL (ref 78.0–100.0)
MONO ABS: 0.7 10*3/uL (ref 0.1–1.0)
Monocytes Relative: 10 % (ref 3–12)
Neutro Abs: 4.5 10*3/uL (ref 1.7–7.7)
Neutrophils Relative %: 63 % (ref 43–77)
Platelets: 293 10*3/uL (ref 150–400)
RBC: 3.89 MIL/uL (ref 3.87–5.11)
RDW: 14.2 % (ref 11.5–15.5)
WBC: 7.2 10*3/uL (ref 4.0–10.5)

## 2013-12-31 MED ORDER — LEVOTHYROXINE SODIUM 175 MCG PO TABS: 175.0000 ug | ORAL_TABLET | Freq: Every day | ORAL | Status: DC

## 2013-12-31 NOTE — Progress Notes (Signed)
   Subjective:    Patient ID: Rachael [name], female    DOB: [birth year], 80 y.o.   MRN: [id number]  HPI Very nice 80 yo WWF w/ HTN, refractory edema who has had Lasix dosing step-wise increased recently with only slight diuresis as evidenced by serial weights who over the last 2 weeks after stopping her Amlodipine and with increased lasix dosing has dropped 14# in the interval with reduction of her dependent edema and reporting breathing better and feeling better in general. Most recent BMET about 3 days ago did show rising prerenal indices. Meds, All & PMH otherwise unchanged. Review of recent labs does show elevated TSH 10+ in recent hospital labs.  Review of Systems Neg to above.   Objective:   Physical Exam  BP 158/70  Pulse 64  Temp(Src) 97.9 F (36.6 C) (Temporal)  Resp 16  Ht 5' 8.5" (1.74 m)  Wt 123 lb 12.8 oz (56.155 kg)  BMI 18.55 kg/m2  HEENT - Eac's patent. TM's Nl. EOM's full. PERRLA. NasoOroPharynx clear. Neck - supple. Nl Thyroid. Carotids 2+ & No bruits, nodes, JVD Chest - Clear equal BS w/o Rales, rhonchi, wheezes. Cor - Nl HS. RRR w/o sig MGR. PP 1(+) w/!(+) Pretibial edema edema. Abd - Soft & no palpable masses or tenderness. BS nl. MS- FROM w/o deformities. Muscle power, tone and bulk decreased consistant with age & deconditioning. Gait Nl. Neuro - No obvious Cr N abnormalities. Sensory, motor and Cerebellar functions appear Nl w/o focal abnormalities. Psyche - Mental status flat.  No delusions, ideations or obvious mood abnormalities.'  Assessment & Plan:   1. Hypertension secondary to other renal disorders  2. Edema, Venous Stasis Dependent  3. Encounter for long-term (current) use of other medications - CBC with Differential - Magnesium  4. Hypothyroidism - TSH  5. Unspecified vitamin D deficiency - Vit D  monitoring

## 2014-01-01 LAB — VITAMIN D 25 HYDROXY (VIT D DEFICIENCY, FRACTURES): VIT D 25 HYDROXY: 75 ng/mL (ref 30–89)

## 2014-01-01 LAB — MAGNESIUM: Magnesium: 2.4 mg/dL (ref 1.5–2.5)

## 2014-01-01 LAB — TSH: TSH: 0.185 u[IU]/mL — ABNORMAL LOW (ref 0.350–4.500)

## 2014-01-07 ENCOUNTER — Other Ambulatory Visit: Payer: Self-pay | Admitting: *Deleted

## 2014-01-07 MED ORDER — ATORVASTATIN CALCIUM 20 MG PO TABS: 20.0000 mg | ORAL_TABLET | Freq: Every day | ORAL | Status: DC

## 2014-01-15 DIAGNOSIS — D649 Anemia, unspecified: Secondary | ICD-10-CM

## 2014-01-15 DIAGNOSIS — I509 Heart failure, unspecified: Secondary | ICD-10-CM

## 2014-01-15 DIAGNOSIS — N183 Chronic kidney disease, stage 3 unspecified: Secondary | ICD-10-CM

## 2014-01-15 DIAGNOSIS — I5032 Chronic diastolic (congestive) heart failure: Secondary | ICD-10-CM

## 2014-01-15 DIAGNOSIS — I2510 Atherosclerotic heart disease of native coronary artery without angina pectoris: Secondary | ICD-10-CM

## 2014-01-15 DIAGNOSIS — I129 Hypertensive chronic kidney disease with stage 1 through stage 4 chronic kidney disease, or unspecified chronic kidney disease: Secondary | ICD-10-CM

## 2014-01-18 ENCOUNTER — Telehealth: Payer: Self-pay | Admitting: *Deleted

## 2014-01-18 NOTE — Telephone Encounter (Signed)
Dr Aundra Dubin reviewed monitor done 12/24/13  Mild sinus bradycardia, no worrisome events.   Pt notified.

## 2014-01-21 ENCOUNTER — Encounter: Payer: Self-pay | Admitting: Cardiology

## 2014-01-21 ENCOUNTER — Ambulatory Visit (INDEPENDENT_AMBULATORY_CARE_PROVIDER_SITE_OTHER): Payer: Medicare Other | Admitting: Cardiology

## 2014-01-21 VITALS — BP 142/50 | HR 55 | Temp 97.7°F | Ht 68.0 in | Wt 117.0 lb

## 2014-01-21 DIAGNOSIS — N183 Chronic kidney disease, stage 3 unspecified: Secondary | ICD-10-CM

## 2014-01-21 DIAGNOSIS — I6529 Occlusion and stenosis of unspecified carotid artery: Secondary | ICD-10-CM

## 2014-01-21 DIAGNOSIS — E785 Hyperlipidemia, unspecified: Secondary | ICD-10-CM

## 2014-01-21 DIAGNOSIS — R55 Syncope and collapse: Secondary | ICD-10-CM

## 2014-01-21 DIAGNOSIS — I5032 Chronic diastolic (congestive) heart failure: Secondary | ICD-10-CM

## 2014-01-21 DIAGNOSIS — I2510 Atherosclerotic heart disease of native coronary artery without angina pectoris: Secondary | ICD-10-CM

## 2014-01-21 DIAGNOSIS — I509 Heart failure, unspecified: Secondary | ICD-10-CM

## 2014-01-21 LAB — BASIC METABOLIC PANEL
BUN: 28 mg/dL — ABNORMAL HIGH (ref 6–23)
CALCIUM: 9.1 mg/dL (ref 8.4–10.5)
CHLORIDE: 96 meq/L (ref 96–112)
CO2: 33 meq/L — AB (ref 19–32)
Creatinine, Ser: 1.7 mg/dL — ABNORMAL HIGH (ref 0.4–1.2)
GFR: 29.94 mL/min — ABNORMAL LOW (ref >60.00)
Glucose, Bld: 103 mg/dL — ABNORMAL HIGH (ref 70–99)
Potassium: 3.3 mEq/L — ABNORMAL LOW (ref 3.5–5.1)
Sodium: 141 mEq/L (ref 135–145)

## 2014-01-21 NOTE — Progress Notes (Signed)
Patient ID: Rachael [name], female   DOB: 11/12/1923, 80 y.o.   MRN: [id number] PCP: Dr. Melford Aase  80 yo with history of HTN, TIAs, and chronic diastolic CHF presents for cardiology followup.  She fractured both of her femurs, once in 11/13 and once in 12/13.  She had surgery both times.  She was then admitted in 5/14 with dehydration and acute on chronic renal failure.  During the admission with femur fracture in 12/13, she was told she had "fluid around her lungs."  She was taken off bumetanide when she was admitted with dehydration in 5/14 and put on Lasix later on.  In 6/15, she was admitted with upper GI bleed from erosive esophagitis and had AKI, diuretics were held.  In 7/15, she was then admitted with acute on chronic diastolic CHF and Lasix was restarted.  She has had several syncopal events and was set up for an event monitor by Richardson Dopp.  She is off amlodipine due to leg swelling.   She has lost 18 lbs since last visit and is back on Lasix bid.  Creatinine was stable when checked earlier this month.  She is doing better.  No dyspnea walking on flat ground, mild dyspnea walking up an incline.  No PND or orthopnea.  No chest pain. No syncope or lightheadedness.  No melena or BRBPR.  Echo in 7/15 showed normal EF and mild AS.   Labs (5/14): K 4.3, creatinine 1.5 Labs (7/14): K 4.3, creatinine 1.5, LDL 55, HDL 51 Labs (8/15): K 3.5, creatinine 1.7, BNP 64, HCT 33.2  Allergies (verified):  No Known Drug Allergies   Past Medical History:  1. DIVERTICULOSIS, COLON   2. HYPOTHYROIDISM  3. HYPERTENSION  4. TIA: x 2, manifested by diplopia. Carotid dopplers (8/11): 40-50% RICA stenosis.  Carotid dopplers (12/13) with 40-59% bilateral ICA stenoses. Carotids (8/15) with bilateral ICA 40-59% stenoses.  5. Bradycardia: Mild.  Holter monitor (8/11) with no significant bradycardia, frequent PACs, few short runs of slow atrial tachycardia.  6. Chronic diastolic CHF:  Echo (9/93): EF 65%, mild LV  hypertrophy, aortic sclerosis without significant stenosis, mild mitral regurgitation.  Echo (12/13) with EF 65-70%, moderate TR, PA systolic pressure 55 mmHg, mild AS with mean gradient 17 mmHg. Echo (7/15) with EF 55-60%, mild LVH, mild AS, mild AI, mild MR.  7. Aortic stenosis: Mild on 7/15 echo.  8. Femur fractures in 11/13 and 12/13, had surgery for repair both times.   9. CKD 10. Upper GI bleed: 6/15, erosive esophagitis.  11. Syncope  Family History:  No premature CAD. Mother with CVA in her 85s.   Social History:  Lives alone. Widowed. No children but has a nephew living in town. Nonsmoker.   ROS: All systems reviewed and negative except as per HPI.   Current Outpatient Prescriptions  Medication Sig Dispense Refill  . allopurinol (ZYLOPRIM) 100 MG tablet Take 100 mg by mouth every morning.      Marland Kitchen atorvastatin (LIPITOR) 20 MG tablet Take 1 tablet (20 mg total) by mouth daily.  30 tablet  3  . Biotin 1000 MCG tablet Take 1,000 mcg by mouth every morning.       . bisoprolol (ZEBETA) 10 MG tablet Take 1 tablet (10 mg total) by mouth daily.  30 tablet  3  . cholecalciferol (VITAMIN D) 1000 UNITS tablet Take 1,000 Units by mouth every morning.       . cloNIDine (CATAPRES) 0.2 MG tablet Take 1 tablet (0.2 mg total)  by mouth 3 (three) times daily.  90 tablet  99  . furosemide (LASIX) 40 MG tablet 2 tabs twice daily = 80 mg twice daily  120 tablet  11  . levothyroxine (SYNTHROID, LEVOTHROID) 175 MCG tablet Take 1 tablet (175 mcg total) by mouth daily before breakfast.  30 tablet  2  . Multiple Vitamin (MULTIVITAMIN WITH MINERALS) TABS Take 1 tablet by mouth every morning.       . pantoprazole (PROTONIX) 40 MG tablet Take 1 tablet (40 mg total) by mouth 2 (two) times daily before a meal.  60 tablet  3  . polyvinyl alcohol (LIQUIFILM TEARS) 1.4 % ophthalmic solution Place 1-2 drops into both eyes as needed for dry eyes.      . potassium chloride SA (K-DUR,KLOR-CON) 20 MEQ tablet Take 1  tablet (20 mEq total) by mouth daily.  30 tablet  3  . Propylene Glycol (SYSTANE BALANCE OP) Apply 1-2 drops to eye 3 (three) times daily.      . sodium chloride (MURO 128) 5 % ophthalmic solution Place 1 drop into both eyes at bedtime.      . traZODone (DESYREL) 25 mg TABS tablet Take 0.5 tablets (25 mg total) by mouth at bedtime as needed for sleep.  15 tablet  0  . aspirin EC 81 MG tablet Take 1 tablet (81 mg total) by mouth daily.       No current facility-administered medications for this visit.    BP 142/50  Pulse 55  Temp(Src) 97.7 F (36.5 C)  Ht 5\' 8"  (1.727 m)  Wt 53.071 kg (117 lb)  BMI 17.79 kg/m2  SpO2 99% General: NAD Neck: JVP 7 cm, no thyromegaly or thyroid nodule.  Lungs: Clear to auscultation bilaterally with normal respiratory effort. CV: Nondisplaced PMI.  Heart regular S1/S2, no S3/S4, 1/6 early systolic murmur RUSB.  Trace ankle edema.  Bilateral carotid bruits.  Normal pedal pulses.  Abdomen: Soft, nontender, no hepatosplenomegaly, no distention.  Neurologic: Alert and oriented x 3.  Psych: Normal affect. Extremities: No clubbing or cyanosis.   Assessment/Plan: 1. Chronic diastolic CHF: Patient does not appear significantly volume overloaded, weight is down considerably now that she is back on Lasix.  NYHA class II symptoms currently.  Creatinine stable earlier this month.  Check BMET today.  2. HTN: BP stable.  Goal for her age would be SBP < 150. She is off amlodipine due to ankle swelling. 3. Carotid stenosis: Repeat carotids in 8/16.    4. Aortic stenosis: Mild, continue to follow over time.  5. CKD: Stable with re-initiation of Lasix.  Will repeat BMET today.  I will arrange for nephrology followup.  6. Syncope: No recurrence.  She is getting an event monitor. Echo showed mild AS.    Followup in 4-6 weeks in CHF clinic.   Loralie Champagne 01/21/2014   Loralie Champagne 01/21/2014

## 2014-01-21 NOTE — Patient Instructions (Signed)
Decrease aspirin to 81mg  daily.   Your physician recommends that you have  lab work today--BMET.  You have been referred to Nephrology for chronic kidney disease.   Your physician recommends that you schedule a follow-up appointment in: 4-6 weeks with Dr Aundra Dubin in the [facility].

## 2014-01-22 ENCOUNTER — Other Ambulatory Visit: Payer: Self-pay | Admitting: *Deleted

## 2014-01-22 DIAGNOSIS — E876 Hypokalemia: Secondary | ICD-10-CM

## 2014-01-22 MED ORDER — POTASSIUM CHLORIDE CRYS ER 20 MEQ PO TBCR: 20.0000 meq | EXTENDED_RELEASE_TABLET | Freq: Two times a day (BID) | ORAL | Status: DC

## 2014-01-31 ENCOUNTER — Other Ambulatory Visit: Payer: Self-pay | Admitting: Internal Medicine

## 2014-01-31 ENCOUNTER — Ambulatory Visit (INDEPENDENT_AMBULATORY_CARE_PROVIDER_SITE_OTHER): Payer: Medicare Other | Admitting: Internal Medicine

## 2014-01-31 ENCOUNTER — Encounter: Payer: Self-pay | Admitting: Internal Medicine

## 2014-01-31 VITALS — BP 128/62 | HR 60 | Temp 98.1°F | Resp 16 | Ht 68.5 in | Wt 119.8 lb

## 2014-01-31 DIAGNOSIS — Z23 Encounter for immunization: Secondary | ICD-10-CM

## 2014-01-31 DIAGNOSIS — Z1231 Encounter for screening mammogram for malignant neoplasm of breast: Secondary | ICD-10-CM

## 2014-01-31 DIAGNOSIS — I151 Hypertension secondary to other renal disorders: Secondary | ICD-10-CM

## 2014-01-31 DIAGNOSIS — N2889 Other specified disorders of kidney and ureter: Principal | ICD-10-CM

## 2014-01-31 DIAGNOSIS — N318 Other neuromuscular dysfunction of bladder: Secondary | ICD-10-CM

## 2014-01-31 DIAGNOSIS — I2510 Atherosclerotic heart disease of native coronary artery without angina pectoris: Secondary | ICD-10-CM

## 2014-01-31 DIAGNOSIS — I150 Renovascular hypertension: Secondary | ICD-10-CM

## 2014-01-31 DIAGNOSIS — N3281 Overactive bladder: Secondary | ICD-10-CM

## 2014-01-31 MED ORDER — OXYBUTYNIN CHLORIDE 5 MG PO TABS: 5.0000 mg | ORAL_TABLET | Freq: Three times a day (TID) | ORAL | Status: DC

## 2014-01-31 NOTE — Progress Notes (Signed)
Subjective:    Patient ID: SHAQUETA CASADY, female    DOB: 10-Aug-1923, 80 y.o.   MRN: [id number]  HPI 80 yo WWF w/HTN,  Ch DiasCHF and CKD who remains stable and diuresed with Lasix and with Amlodipine d/c'd  ~ 15-20 # and edema resolved and she seems to be maintaining her dry weight. Elevated TSH from hospital had normalized at last OV. Patient doing well and seems to be maintaining her independence.    Medication List   allopurinol 100 MG tablet  Commonly known as:  ZYLOPRIM  Take 100 mg by mouth every morning.     aspirin EC 81 MG tablet  Take 1 tablet (81 mg total) by mouth daily.     atorvastatin 20 MG tablet  Commonly known as:  LIPITOR  Take 1 tablet (20 mg total) by mouth daily.     Biotin 1000 MCG tablet  Take 1,000 mcg by mouth every morning.     bisoprolol 10 MG tablet  Commonly known as:  ZEBETA  Take 1 tablet (10 mg total) by mouth daily.     cholecalciferol 1000 UNITS tablet  Commonly known as:  VITAMIN D  Take 1,000 Units by mouth every morning.     cloNIDine 0.2 MG tablet  Commonly known as:  CATAPRES  Take 1 tablet (0.2 mg total) by mouth 3 (three) times daily.     furosemide 40 MG tablet  Commonly known as:  LASIX  2 tabs twice daily = 80 mg twice daily     levothyroxine 175 MCG tablet  Commonly known as:  SYNTHROID, LEVOTHROID  Take 1 tablet (175 mcg total) by mouth daily before breakfast.     multivitamin with minerals Tabs tablet  Take 1 tablet by mouth every morning.     oxybutynin 5 MG tablet  Commonly known as:  DITROPAN  Take 1 tablet (5 mg total) by mouth 3 (three) times daily. For overactive bladdder     pantoprazole 40 MG tablet  Commonly known as:  PROTONIX  Take 1 tablet (40 mg total) by mouth 2 (two) times daily before a meal.     polyvinyl alcohol 1.4 % ophthalmic solution  Commonly known as:  LIQUIFILM TEARS  Place 1-2 drops into both eyes as needed for dry eyes.     potassium chloride SA 20 MEQ tablet  Commonly known as:   K-DUR,KLOR-CON  Take 1 tablet (20 mEq total) by mouth 2 (two) times daily.     sodium chloride 5 % ophthalmic solution  Commonly known as:  MURO 128  Place 1 drop into both eyes at bedtime.     SYSTANE BALANCE OP  Apply 1-2 drops to eye 3 (three) times daily.     traZODone 25 mg Tabs tablet  Commonly known as:  DESYREL  Take 0.5 tablets (25 mg total) by mouth at bedtime as needed for sleep.     Allergies  Allergen Reactions  . Aricept [Donepezil Hcl]     Nausea and vomiting  . Prevacid [Lansoprazole]     GI upset  . Tylenol [Acetaminophen]     Tylenol #3 - Nausea   Past Medical History  Diagnosis Date  . Vitamin D deficiency   . Diverticulosis   . Coronary artery calcification seen on CAT scan   . TIA (transient ischemic attack)     Carotid dopplers (8/11): 40-50% RICA stenosis. Carotid dopplers (12/13) with 40-59% bilateral ICA stenoses.   . CKD (chronic kidney disease)   .  Inguinal hernia   . Broken hip     bilateral  . Hypertension   . Hyperlipidemia   . Hypothyroidism   . Senile osteoporosis   . Osteopenia   . Elevated hemoglobin A1c   . Bradycardia     Holter monitor (8/11) with no significant bradycardia, frequent PACs, few short runs of slow atrial tachycardia.   . Chronic diastolic heart failure     a. Echo (6/15):  EF 60-65%, Gr 2 DD, mild AS (mean 8 mmHg);  b.  Echo (11/2013):  mild LVH, EF 55-60%, no RWMA, Gr 1 DD, mild AS (peak 19 mmHg), mild MR, trivial eff   . Aortic stenosis     a. mild (echo 6/15 with mean gradient 8 mmHg)  . Carotid stenosis     a. Carotid US (04/2012):  bilateral ICA 40-59%;  b.  Carotid US (8/15): Bilateral ICA 40-59% - follow up 1 year    Review of Systems neg to above.  Objective:   Physical Exam  BP 128/62  Pulse 60  Temp98.1 F   Resp 16  Ht 5' 8.5"   Wt 119 lb 12.8 oz   BMI 17.95  HEENT - Eac's patent. TM's Nl. EOM's full. PERRLA.  NasoOroPharynx clear. Neck - supple. Nl Thyroid. Carotids 2+ & No bruits, nodes,  JVD Chest - Clear equal BS w/o Rales, rhonchi, wheezes. Cor - Nl HS. RRR w/ soft Sys M, no GR. PP 1(+). No edema. Abd - No palpable organomegaly, masses or tenderness. BS nl. MS- FROM w/o deformities. Muscle power, tone and bulk Nl. Gait Nl. Neuro - No obvious Cr N abnormalities. Sensory, motor and Cerebellar functions appear Nl w/o focal abnormalities. Psyche - Mental status normal & appropriate.  No delusions, ideations or obvious mood abnormalities.  Assessment & Plan:   1. Hypertension   2. OAB (overactive bladder) - Sx's Vesicare to try at HS  3. Need for prophylactic vaccination and inoculation against influenza - Flu vaccine HIGH DOSE PF (Fluzone Tri High dose)  Discussed diet, meds,& SE. ROV 3 mo.

## 2014-01-31 NOTE — Patient Instructions (Signed)
Overactive Bladder The bladder has two functions that are totally opposite of the other. One is to relax and stretch out so it can store urine (fills like a balloon), and the other is to contract and squeeze down so that it can empty the urine that it has stored. Proper functioning of the bladder is a complex mixing of these two functions. The filling and emptying of the bladder can be influenced by:  The bladder.  The spinal cord.  The brain.  The nerves going to the bladder.  Other organs that are closely related to the bladder such as prostate in males and the vagina in females. As your bladder fills with urine, nerve signals are sent from the bladder to the brain to tell you that you may need to urinate. Normal urination requires that the bladder squeeze down with sufficient strength to empty the bladder, but this also requires that the bladder squeeze down sufficiently long to finish the job. In addition the sphincter muscles, which normally keep you from leaking urine, must also relax so that the urine can pass. Coordination between the bladder muscle squeezing down and the sphincter muscles relaxing is required to make everything happen normally. With an overactive bladder sometimes the muscles of the bladder contract unexpectedly and involuntarily and this causes an urgent need to urinate. The normal response is to try to hold urine in by contracting the sphincter muscles. Sometimes the bladder contracts so strongly that the sphincter muscles cannot stop the urine from passing out and incontinence occurs. This kind of incontinence is called urge incontinence. Having an overactive bladder can be embarrassing and awkward. It can keep you from living life the way you want to. Many people think it is just something you have to put up with as you grow older or have certain health conditions. In fact, there are treatments that can help make your life easier and more pleasant. CAUSES  Many things  can cause an overactive bladder. Possibilities include:  Urinary tract infection or infection of nearby tissues such as the prostate.  Prostate enlargement.  In women, multiple pregnancies or surgery on the uterus or urethra.  Bladder stones, inflammation, or tumors.  Caffeine.  Alcohol.  Medications. For example, diuretics (drugs that help the body get rid of extra fluid) increase urine production. Some other medicines must be taken with lots of fluids.  Muscle or nerve weakness. This might be the result of a spinal cord injury, a stroke, multiple sclerosis, or Parkinson disease.  Diabetes can cause a high urine volume which fills the bladder so quickly that the normal urge to urinate is triggered very strongly. SYMPTOMS   Loss of bladder control. You feel the need to urinate and cannot make your body wait.  Sudden, strong urges to urinate.  Urinating 8 or more times a day.  Waking up to urinate two or more times a night. DIAGNOSIS  To decide if you have overactive bladder, your health care provider will probably:  Ask about symptoms you have noticed.  Ask about your overall health. This will include questions about any medications you are taking.  Do a physical examination. This will help determine if there are obvious blockages or other problems.  Order some tests. These might include:  A blood test to check for diabetes or other health issues that could be contributing to the problem.  Urine testing. This could measure the flow of urine and the pressure on the bladder.  A test of your neurological   system (the brain, spinal cord, and nerves). This is the system that senses the need to urinate. Some of these tests are called flow tests, bladder pressure tests, and electrical measurements of the sphincter muscle.  A bladder test to check whether it is emptying completely when you urinate.  Cystoscopy. This test uses a thin tube with a tiny camera on it. It offers a  look inside your urethra and bladder to see if there are problems.  Imaging tests. You might be given a contrast dye and then asked to urinate. X-rays are taken to see how your bladder is working. TREATMENT  An overactive bladder can be treated in many ways. The treatment will depend on the cause. Whether you have a mild or severe case also makes a difference. Often, treatment can be given in your health care provider's office or clinic. Be sure to discuss the different options with your caregiver. They include:  Behavioral treatments. These do not involve medication or surgery:  Bladder training. For this, you would follow a schedule to urinate at regular intervals. This helps you learn to control the urge to urinate. At first, you might be asked to wait a few minutes after feeling the urge. In time, you should be able to schedule bathroom visits an hour or more apart.  Kegel exercises. These exercises strengthen the pelvic floor muscles, which support the bladder. Toning these muscles can help control urination even if the bladder muscles are overactive. A specialist will teach you how to do these exercises correctly. They will require daily practice.  Weight loss. If you are obese or overweight, losing weight might stop your bladder from being overactive. Talk to your health care provider about how many pounds you should lose. Also ask if there is a specific program or method that would work best for you.  Diet change. This might be suggested if constipation is making your overactive bladder worse. Your health care provider or a nutritionist can explain ways to change what you eat to ease constipation. Other people might need to take in less caffeine or alcohol. Sometimes drinking fewer fluids is needed, too.  Protection. This is not an actual treatment. But, you could wear special pads to take care of any leakage while you wait for other treatments to take effect. This will help you avoid  embarrassment.  Physical treatments.  Electrical stimulation. Electrodes will send gentle pulses to the nerves or muscles that help control the bladder. The goal is to strengthen them. Sometimes this is done with the electrodes outside the body. Or, they might be placed inside the body (implanted). This treatment can take several months to have an effect.  Medications. These are usually used along with other treatments. Several medicines are available. Some are injected into the muscles involved in urination. Others come in pill form. Medications sometimes prescribed include:  Anticholinergics. These drugs block the signals that the nerves deliver to the bladder. This keeps it from releasing urine at the wrong time. Researchers think the drugs might help in other ways, too.  Imipramine. This is an antidepressant. But, it relaxes bladder muscles.  Botox. This is still experimental. Some people believe that injecting it into the bladder muscles will relax them so they work more normally. It has also been injected into the sphincter muscle when the sphincter muscle does not open properly. This is a temporary fix, however. Also, it might make matters worse, especially in older people.  Surgery.  A device might be implanted   to help manage your nerves. It works on the nerves that signal when you need to urinate.  Surgery is sometimes needed with electrical stimulation. If the electrodes are implanted, this is done through surgery.  Sometimes repairs need to be made through surgery. For example, the size of the bladder can be changed. This is usually done in severe cases only. HOME CARE INSTRUCTIONS   Take any medications your health care provider prescribed or suggested. Follow the directions carefully.  Practice any lifestyle changes that are recommended. These might include:  Drinking less fluid or drinking at different times of the day. If you need to urinate often during the night, for  example, you may need to stop drinking fluids early in the evening.  Cutting down on caffeine or alcohol. They can both make an overactive bladder worse. Caffeine is found in coffee, tea, and sodas.  Doing Kegel exercises to strengthen muscles.  Losing weight, if that is recommended.  Eating a healthy and balanced diet. This will help you avoid constipation.  Keep a journal or a log. You might be asked to record how much you drink and when, and also when you feel the need to urinate.  Learn how to care for implants or other devices, such as pessaries. SEEK MEDICAL CARE IF:   Your overactive bladder gets worse.  You feel increased pain or irritation when you urinate.  You notice blood in your urine.  You have questions about any medications or devices that your health care provider recommended.  You notice blood, pus, or swelling at the site of any test or treatment procedure.  You have an oral temperature above 102F (38.9C). SEEK IMMEDIATE MEDICAL CARE IF:  You have an oral temperature above 102F (38.9C), not controlled by medicine. Document Released: 03/06/2009 Document Revised: 09/24/2013 Document Reviewed: 03/06/2009 [employer] Patient Information 2015 [employer]. This information is not intended to replace advice given to you by your health care provider. Make sure you discuss any questions you have with your health care provider.  

## 2014-02-05 ENCOUNTER — Other Ambulatory Visit: Payer: PRIVATE HEALTH INSURANCE

## 2014-02-06 ENCOUNTER — Other Ambulatory Visit (INDEPENDENT_AMBULATORY_CARE_PROVIDER_SITE_OTHER): Payer: Medicare Other

## 2014-02-06 DIAGNOSIS — E876 Hypokalemia: Secondary | ICD-10-CM

## 2014-02-07 LAB — BASIC METABOLIC PANEL
BUN: 36 mg/dL — ABNORMAL HIGH (ref 6–23)
CALCIUM: 9.4 mg/dL (ref 8.4–10.5)
CO2: 33 mEq/L — ABNORMAL HIGH (ref 19–32)
CREATININE: 1.5 mg/dL — AB (ref 0.4–1.2)
Chloride: 98 mEq/L (ref 96–112)
GFR: 33.78 mL/min — AB (ref >60.00)
GLUCOSE: 133 mg/dL — AB (ref 70–99)
Potassium: 3.7 mEq/L (ref 3.5–5.1)
SODIUM: 139 meq/L (ref 135–145)

## 2014-02-19 ENCOUNTER — Ambulatory Visit (HOSPITAL_COMMUNITY)
Admission: RE | Admit: 2014-02-19 | Discharge: 2014-02-19 | Disposition: A | Discharge status: Home / Self Care | Payer: Medicare Other | Source: Ambulatory Visit | Attending: Cardiology | Admitting: Cardiology

## 2014-02-19 ENCOUNTER — Encounter (HOSPITAL_COMMUNITY): Payer: Self-pay

## 2014-02-19 VITALS — BP 180/62 | HR 57 | Wt 119.1 lb

## 2014-02-19 DIAGNOSIS — K5730 Diverticulosis of large intestine without perforation or abscess without bleeding: Secondary | ICD-10-CM | POA: Insufficient documentation

## 2014-02-19 DIAGNOSIS — I509 Heart failure, unspecified: Secondary | ICD-10-CM | POA: Diagnosis not present

## 2014-02-19 DIAGNOSIS — Z79899 Other long term (current) drug therapy: Secondary | ICD-10-CM | POA: Diagnosis not present

## 2014-02-19 DIAGNOSIS — I498 Other specified cardiac arrhythmias: Secondary | ICD-10-CM | POA: Diagnosis not present

## 2014-02-19 DIAGNOSIS — N183 Chronic kidney disease, stage 3 unspecified: Secondary | ICD-10-CM

## 2014-02-19 DIAGNOSIS — Z7982 Long term (current) use of aspirin: Secondary | ICD-10-CM | POA: Insufficient documentation

## 2014-02-19 DIAGNOSIS — I359 Nonrheumatic aortic valve disorder, unspecified: Secondary | ICD-10-CM | POA: Insufficient documentation

## 2014-02-19 DIAGNOSIS — R55 Syncope and collapse: Secondary | ICD-10-CM | POA: Insufficient documentation

## 2014-02-19 DIAGNOSIS — I5032 Chronic diastolic (congestive) heart failure: Secondary | ICD-10-CM

## 2014-02-19 DIAGNOSIS — Z8673 Personal history of transient ischemic attack (TIA), and cerebral infarction without residual deficits: Secondary | ICD-10-CM | POA: Diagnosis not present

## 2014-02-19 DIAGNOSIS — I129 Hypertensive chronic kidney disease with stage 1 through stage 4 chronic kidney disease, or unspecified chronic kidney disease: Secondary | ICD-10-CM | POA: Diagnosis not present

## 2014-02-19 DIAGNOSIS — E039 Hypothyroidism, unspecified: Secondary | ICD-10-CM | POA: Diagnosis not present

## 2014-02-19 DIAGNOSIS — N189 Chronic kidney disease, unspecified: Secondary | ICD-10-CM | POA: Insufficient documentation

## 2014-02-19 DIAGNOSIS — K2289 Other specified disease of esophagus: Secondary | ICD-10-CM | POA: Insufficient documentation

## 2014-02-19 DIAGNOSIS — I6529 Occlusion and stenosis of unspecified carotid artery: Secondary | ICD-10-CM

## 2014-02-19 DIAGNOSIS — K208 Other esophagitis without bleeding: Secondary | ICD-10-CM | POA: Diagnosis not present

## 2014-02-19 DIAGNOSIS — K228 Other specified diseases of esophagus: Secondary | ICD-10-CM | POA: Diagnosis not present

## 2014-02-19 DIAGNOSIS — I079 Rheumatic tricuspid valve disease, unspecified: Secondary | ICD-10-CM | POA: Insufficient documentation

## 2014-02-19 MED ORDER — HYDRALAZINE HCL 25 MG PO TABS: 25.0000 mg | ORAL_TABLET | Freq: Three times a day (TID) | ORAL | Status: DC

## 2014-02-19 NOTE — Patient Instructions (Signed)
Start Hydralazine 25 mg Three times a day   Your physician recommends that you schedule a follow-up appointment in: 4 months

## 2014-02-19 NOTE — Progress Notes (Signed)
Patient ID: Rachael [name], female   DOB: 1924-05-24, 80 y.o.   MRN: [id number] PCP: Dr. Melford Aase  80 yo with history of HTN, TIAs, and chronic diastolic CHF presents for cardiology followup.  She fractured both of her femurs, once in 11/13 and once in 12/13.  She had surgery both times.  She was then admitted in 5/14 with dehydration and acute on chronic renal failure.  During the admission with femur fracture in 12/13, she was told she had "fluid around her lungs."  She was taken off bumetanide when she was admitted with dehydration in 5/14 and put on Lasix later on.  In 6/15, she was admitted with upper GI bleed from erosive esophagitis and had AKI, diuretics were held.  In 7/15, she was then admitted with acute on chronic diastolic CHF and Lasix was restarted.  She has had several syncopal events and was set up for an event monitor by Richardson Dopp.  This showed only mild sinus bradycardia, no significant events.  She is off amlodipine due to leg swelling.   She is doing well currently with no recurrent syncope. No dyspnea walking on flat ground, mild dyspnea walking up an incline.  She is most limited by back and knee pain.  No PND or orthopnea.  No chest pain.  No melena or BRBPR.  Echo in 7/15 showed normal EF and mild AS.  BP has been running high.  It is high today but she was stuck in the elevator just before she got here.  At home, it has been running systolic [id number].    Labs (5/14): K 4.3, creatinine 1.5 Labs (7/14): K 4.3, creatinine 1.5, LDL 55, HDL 51 Labs (8/15): K 3.5, creatinine 1.7, BNP 64, HCT 33.2  Labs (9/15): K 3.7, creatinine 1.5  Allergies (verified):  No Known Drug Allergies   Past Medical History:  1. DIVERTICULOSIS, COLON   2. HYPOTHYROIDISM  3. HYPERTENSION  4. TIA: x 2, manifested by diplopia. Carotid dopplers (8/11): 40-50% RICA stenosis.  Carotid dopplers (12/13) with 40-59% bilateral ICA stenoses. Carotids (8/15) with bilateral ICA 40-59% stenoses.  5.  Bradycardia: Mild.  Holter monitor (8/11) with no significant bradycardia, frequent PACs, few short runs of slow atrial tachycardia.  6. Chronic diastolic CHF:  Echo (5/00): EF 65%, mild LV hypertrophy, aortic sclerosis without significant stenosis, mild mitral regurgitation.  Echo (12/13) with EF 65-70%, moderate TR, PA systolic pressure 55 mmHg, mild AS with mean gradient 17 mmHg. Echo (7/15) with EF 55-60%, mild LVH, mild AS, mild AI, mild MR.  7. Aortic stenosis: Mild on 7/15 echo.  8. Femur fractures in 11/13 and 12/13, had surgery for repair both times.   9. CKD 10. Upper GI bleed: 6/15, erosive esophagitis.  11. Syncope: Event monitor (8/15) with no significant events.   Family History:  No premature CAD. Mother with CVA in her 45s.   Social History:  Lives alone. Widowed. No children but has a nephew living in town. Nonsmoker.   ROS: All systems reviewed and negative except as per HPI.   Current Outpatient Prescriptions  Medication Sig Dispense Refill  . allopurinol (ZYLOPRIM) 100 MG tablet Take 100 mg by mouth every morning.      Marland Kitchen aspirin EC 81 MG tablet Take 1 tablet (81 mg total) by mouth daily.      Marland Kitchen atorvastatin (LIPITOR) 20 MG tablet Take 1 tablet (20 mg total) by mouth daily.  30 tablet  3  . Biotin 1000 MCG tablet Take 1,000  mcg by mouth every morning.       . bisoprolol (ZEBETA) 10 MG tablet Take 1 tablet (10 mg total) by mouth daily.  30 tablet  3  . cholecalciferol (VITAMIN D) 1000 UNITS tablet Take 1,000 Units by mouth every morning.       . cloNIDine (CATAPRES) 0.2 MG tablet Take 1 tablet (0.2 mg total) by mouth 3 (three) times daily.  90 tablet  99  . furosemide (LASIX) 40 MG tablet 2 tabs twice daily = 80 mg twice daily  120 tablet  11  . levothyroxine (SYNTHROID, LEVOTHROID) 175 MCG tablet Take 1 tablet (175 mcg total) by mouth daily before breakfast.  30 tablet  2  . Multiple Vitamin (MULTIVITAMIN WITH MINERALS) TABS Take 1 tablet by mouth every morning.       Marland Kitchen  oxybutynin (DITROPAN) 5 MG tablet Take 1 tablet (5 mg total) by mouth 3 (three) times daily. For overactive bladdder  90 tablet  99  . pantoprazole (PROTONIX) 40 MG tablet Take 1 tablet (40 mg total) by mouth 2 (two) times daily before a meal.  60 tablet  3  . polyvinyl alcohol (LIQUIFILM TEARS) 1.4 % ophthalmic solution Place 1-2 drops into both eyes as needed for dry eyes.      . potassium chloride SA (K-DUR,KLOR-CON) 20 MEQ tablet Take 1 tablet (20 mEq total) by mouth 2 (two) times daily.  60 tablet  3  . Propylene Glycol (SYSTANE BALANCE OP) Apply 1-2 drops to eye 3 (three) times daily.      . sodium chloride (MURO 128) 5 % ophthalmic solution Place 1 drop into both eyes at bedtime.      . traZODone (DESYREL) 25 mg TABS tablet Take 0.5 tablets (25 mg total) by mouth at bedtime as needed for sleep.  15 tablet  0  . hydrALAZINE (APRESOLINE) 25 MG tablet Take 1 tablet (25 mg total) by mouth 3 (three) times daily.  90 tablet  3   No current facility-administered medications for this encounter.    BP 180/62  Pulse 57  Wt 119 lb 1.9 oz (54.032 kg)  SpO2 98% General: NAD Neck: JVP 7 cm, no thyromegaly or thyroid nodule.  Lungs: Clear to auscultation bilaterally with normal respiratory effort. CV: Nondisplaced PMI.  Heart regular S1/S2, no S3/S4, 1/6 early systolic murmur RUSB.  Trace ankle edema.  Bilateral carotid bruits.  Normal pedal pulses.  Abdomen: Soft, nontender, no hepatosplenomegaly, no distention.  Neurologic: Alert and oriented x 3.  Psych: Normal affect. Extremities: No clubbing or cyanosis.   Assessment/Plan: 1. Chronic diastolic CHF: Patient does not appear significantly volume overloaded now.  NYHA class II symptoms currently.  Creatinine stable earlier this month.   2. HTN: BP runs high.  I will start her on hydralazine 25 mg tid (she had leg swelling with amlodipine and avoiding ACEI with CKD). 3. Carotid stenosis: Repeat carotids in 8/16.    4. Aortic stenosis: Mild,  continue to follow over time.  5. CKD: Stable with re-initiation of Lasix.   6. Syncope: No recurrence.  Event monitor showed no significant arrhythmias. Echo showed mild AS.    Followup in 4 months in CHF clinic.   Loralie Champagne 02/19/2014

## 2014-02-22 ENCOUNTER — Telehealth: Payer: Self-pay | Admitting: *Deleted

## 2014-02-22 NOTE — Telephone Encounter (Signed)
Message copied by Katrine Coho on Fri Feb 22, 2014 10:18 AM ------      Message from: Armando Gang      Created: Fri Feb 22, 2014  9:17 AM      Regarding: Referral from July to [facility]        03-05-14  With Dr Gwen Pounds with [facility]  ------

## 2014-02-27 ENCOUNTER — Ambulatory Visit (HOSPITAL_COMMUNITY)
Admission: RE | Admit: 2014-02-27 | Discharge: 2014-02-27 | Disposition: A | Discharge status: Home / Self Care | Payer: Medicare Other | Source: Ambulatory Visit | Attending: Internal Medicine | Admitting: Internal Medicine

## 2014-02-27 DIAGNOSIS — Z1231 Encounter for screening mammogram for malignant neoplasm of breast: Secondary | ICD-10-CM | POA: Diagnosis present

## 2014-03-01 ENCOUNTER — Encounter: Payer: Self-pay | Admitting: Internal Medicine

## 2014-03-01 ENCOUNTER — Ambulatory Visit (INDEPENDENT_AMBULATORY_CARE_PROVIDER_SITE_OTHER): Payer: Medicare Other | Admitting: Internal Medicine

## 2014-03-01 VITALS — BP 156/60 | HR 64 | Temp 98.1°F | Resp 16 | Ht 68.5 in | Wt 122.0 lb

## 2014-03-01 DIAGNOSIS — R7309 Other abnormal glucose: Secondary | ICD-10-CM

## 2014-03-01 DIAGNOSIS — I10 Essential (primary) hypertension: Secondary | ICD-10-CM

## 2014-03-01 DIAGNOSIS — Z716 Tobacco abuse counseling: Secondary | ICD-10-CM

## 2014-03-01 DIAGNOSIS — E039 Hypothyroidism, unspecified: Secondary | ICD-10-CM

## 2014-03-01 DIAGNOSIS — Z79899 Other long term (current) drug therapy: Secondary | ICD-10-CM

## 2014-03-01 DIAGNOSIS — F17210 Nicotine dependence, cigarettes, uncomplicated: Secondary | ICD-10-CM

## 2014-03-01 LAB — CBC WITH DIFFERENTIAL/PLATELET
BASOS PCT: 1 % (ref 0–1)
Basophils Absolute: 0.1 10*3/uL (ref 0.0–0.1)
Eosinophils Absolute: 0.1 10*3/uL (ref 0.0–0.7)
Eosinophils Relative: 1 % (ref 0–5)
HCT: 34.3 % — ABNORMAL LOW (ref 36.0–46.0)
HEMOGLOBIN: 11.6 g/dL — AB (ref 12.0–15.0)
Lymphocytes Relative: 17 % (ref 12–46)
Lymphs Abs: 1.2 10*3/uL (ref 0.7–4.0)
MCH: 28.3 pg (ref 26.0–34.0)
MCHC: 33.8 g/dL (ref 30.0–36.0)
MCV: 83.7 fL (ref 78.0–100.0)
MONOS PCT: 8 % (ref 3–12)
Monocytes Absolute: 0.6 10*3/uL (ref 0.1–1.0)
NEUTROS ABS: 5.3 10*3/uL (ref 1.7–7.7)
NEUTROS PCT: 73 % (ref 43–77)
Platelets: 307 10*3/uL (ref 150–400)
RBC: 4.1 MIL/uL (ref 3.87–5.11)
RDW: 15.4 % (ref 11.5–15.5)
WBC: 7.3 10*3/uL (ref 4.0–10.5)

## 2014-03-01 NOTE — Progress Notes (Signed)
Patient ID: Rachael [name], female   DOB: Sep 12, 1923, 80 y.o.   MRN: [id number]   This very nice 80 y.o.WWF presents for 3 month follow up with Hypertension, ASHD w/chronic Diastolic CHF,  Hyperlipidemia, Pre-Diabetes and Vitamin D Deficiency.    Patient is treated for HTN & BP has been controlled  Patient was recently started on Hydralazine by Dr Aundra Dubin for elevated Sys BP of 180 and today's BP was 156/60 and rechecked at 142/76. Patient has had no complaints of any cardiac type chest pain, palpitations, dyspnea/orthopnea/PND, dizziness, claudication or dependent edema.   Hyperlipidemia is controlled with diet & meds. Patient denies myalgias or other med SE's. Last Lipids were  Total Cholesterol 102; HDL  34*; LDL  46; Triglycerides 108 - at goal on 12/01/2013.   Also, the patient has history of  PreDiabetes and has had no symptoms of reactive hypoglycemia, diabetic polys, paresthesias or visual blurring.  Last A1c was  5.3% on 11/29/2013.    Further, the patient also has history of Vitamin D Deficiency and supplements vitamin D without any suspected side-effects. Last vitamin D was 75 on 12/31/2013.   Medication List   allopurinol 100 MG tablet  Commonly known as:  ZYLOPRIM  Take 100 mg by mouth every morning.     aspirin EC 81 MG tablet  Take 1 tablet (81 mg total) by mouth daily.     atorvastatin 20 MG tablet  Commonly known as:  LIPITOR  Take 1 tablet (20 mg total) by mouth daily.     Biotin 1000 MCG tablet  Take 1,000 mcg by mouth every morning.     bisoprolol 10 MG tablet  Commonly known as:  ZEBETA  Take 1 tablet (10 mg total) by mouth daily.     cholecalciferol 1000 UNITS tablet  Commonly known as:  VITAMIN D  Take 1,000 Units by mouth every morning.     cloNIDine 0.2 MG tablet  Commonly known as:  CATAPRES  Take 1 tablet (0.2 mg total) by mouth 3 (three) times daily.     furosemide 40 MG tablet  Commonly known as:  LASIX  2 tabs twice daily = 80 mg twice daily     hydrALAZINE 25 MG tablet  Commonly known as:  APRESOLINE  Take 1 tablet (25 mg total) by mouth 3 (three) times daily.     levothyroxine 175 MCG tablet  Commonly known as:  SYNTHROID, LEVOTHROID  Take 1 tablet (175 mcg total) by mouth daily before breakfast.     multivitamin with minerals Tabs tablet  Take 1 tablet by mouth every morning.     oxybutynin 5 MG tablet  Commonly known as:  DITROPAN  Take 1 tablet (5 mg total) by mouth 3 (three) times daily. For overactive bladdder     pantoprazole 40 MG tablet  Commonly known as:  PROTONIX  Take 1 tablet (40 mg total) by mouth 2 (two) times daily before a meal.     polyvinyl alcohol 1.4 % ophthalmic solution  Commonly known as:  LIQUIFILM TEARS  Place 1-2 drops into both eyes as needed for dry eyes.     potassium chloride SA 20 MEQ tablet  Commonly known as:  K-DUR,KLOR-CON  Take 1 tablet (20 mEq total) by mouth 2 (two) times daily.     sodium chloride 5 % ophthalmic solution  Commonly known as:  MURO 128  Place 1 drop into both eyes at bedtime.     SYSTANE BALANCE OP  Apply 1-2  drops to eye 3 (three) times daily.     traZODone 25 mg Tabs tablet  Commonly known as:  DESYREL  Take 0.5 tablets (25 mg total) by mouth at bedtime as needed for sleep.     Allergies  Allergen Reactions  . Aricept [Donepezil Hcl]     Nausea and vomiting  . Prevacid [Lansoprazole]     GI upset  . Tylenol [Acetaminophen]     Tylenol #3 - Nausea   PMHx:   Past Medical History  Diagnosis Date  . Vitamin D deficiency   . Diverticulosis   . Coronary artery calcification seen on CAT scan   . TIA (transient ischemic attack)     Carotid dopplers (8/11): 40-50% RICA stenosis. Carotid dopplers (12/13) with 40-59% bilateral ICA stenoses.   . CKD (chronic kidney disease)   . Inguinal hernia   . Broken hip     bilateral  . Hypertension   . Hyperlipidemia   . Hypothyroidism   . Senile osteoporosis   . Osteopenia   . Elevated hemoglobin A1c   .  Bradycardia     Holter monitor (8/11) with no significant bradycardia, frequent PACs, few short runs of slow atrial tachycardia.   . Chronic diastolic heart failure     a. Echo (6/15):  EF 60-65%, Gr 2 DD, mild AS (mean 8 mmHg);  b.  Echo (11/2013):  mild LVH, EF 55-60%, no RWMA, Gr 1 DD, mild AS (peak 19 mmHg), mild MR, trivial eff   . Aortic stenosis     a. mild (echo 6/15 with mean gradient 8 mmHg)  . Carotid stenosis     a. Carotid US (04/2012):  bilateral ICA 40-59%;  b.  Carotid US (8/15): Bilateral ICA 40-59% - follow up 1 year    FHx:    Reviewed / unchanged  SHx:    Reviewed / unchanged  Systems Review:  Constitutional: Denies fever, chills, wt changes, headaches, insomnia, fatigue, night sweats, change in appetite. Eyes: Denies redness, blurred vision, diplopia, discharge, itchy, watery eyes.  ENT: Denies discharge, congestion, post nasal drip, epistaxis, sore throat, earache, hearing loss, dental pain, tinnitus, vertigo, sinus pain, snoring.  CV: Denies chest pain, palpitations, irregular heartbeat, syncope, dyspnea, diaphoresis, orthopnea, PND, claudication or edema. Respiratory: denies cough, dyspnea, DOE, pleurisy, hoarseness, laryngitis, wheezing.  Gastrointestinal: Denies dysphagia, odynophagia, heartburn, reflux, water brash, abdominal pain or cramps, nausea, vomiting, bloating, diarrhea, constipation, hematemesis, melena, hematochezia  or hemorrhoids. Genitourinary: Denies dysuria, frequency, urgency, nocturia, hesitancy, discharge, hematuria or flank pain. Musculoskeletal: Denies arthralgias, myalgias, stiffness, jt. swelling, pain, limping or strain/sprain.  Skin: Denies pruritus, rash, hives, warts, acne, eczema or change in skin lesion(s). Neuro: No weakness, tremor, incoordination, spasms, paresthesia or pain. Psychiatric: Denies confusion, memory loss or sensory loss. Endo: Denies change in weight, skin or hair change.  Heme/Lymph: No excessive bleeding, bruising or  enlarged lymph nodes.  Exam:  BP 156/60  Pulse 64  Temp(Src) 98.1 F (36.7 C)  Resp 16  Ht 5' 8.5" (1.74 m)  Wt 122 lb (55.339 kg)  BMI 18.28 kg/m2  Appears well nourished and in no distress. Eyes: PERRLA, EOMs, conjunctiva no swelling or erythema. Sinuses: No frontal/maxillary tenderness ENT/Mouth: EAC's clear, TM's nl w/o erythema, bulging. Nares clear w/o erythema, swelling, exudates. Oropharynx clear without erythema or exudates. Oral hygiene is good. Tongue normal, non obstructing. Hearing intact.  Neck: Supple. Thyroid nl. Car 2+/2+ without bruits, nodes or JVD. Chest: Respirations nl with BS clear & equal w/o rales, rhonchi,  wheezing or stridor.  Cor: Heart sounds normal w/ regular rate and rhythm without sig. murmurs, gallops, clicks, or rubs. Peripheral pulses normal and equal  With 1-2 (+) pretibial edema.  Abdomen: Soft & bowel sounds normal. Non-tender w/o guarding, rebound, hernias, masses, or organomegaly.  Lymphatics: Unremarkable.  Musculoskeletal: Full ROM all peripheral extremities with generalized decrease in muscle power, tone and bulk from aging and lack of exercise, and normal gait.  Skin: Warm, dry without exposed rashes, lesions or ecchymosis apparent.  Neuro: Cranial nerves intact, reflexes equal bilaterally. Sensory-motor testing grossly intact. Tendon reflexes grossly intact.  Pysch: Alert & oriented x 3.  Insight and judgement nl & appropriate. No ideations.  Assessment and Plan:  1. Hypertension - Continue monitor blood pressure at home. Continue diet/meds same.  2. Hyperlipidemia - Continue diet/meds, exercise,& lifestyle modifications. Continue monitor periodic cholesterol/liver & renal functions   3. Pre-Diabetes - Continue diet, exercise, lifestyle modifications. Monitor appropriate labs.  4. Vitamin D Deficiency - Continue supplementation.  5. CKD, Stage 3 - recheck labs  6. ASCD , with Chronic Diastolic Ht Failure       Recommended  regular exercise, BP monitoring, weight control, and discussed med and SE's. Recommended labs to assess and monitor clinical status. Further disposition pending results of labs.

## 2014-03-02 LAB — INSULIN, FASTING: INSULIN FASTING, SERUM: 8 u[IU]/mL (ref 2.0–19.6)

## 2014-03-02 LAB — BASIC METABOLIC PANEL WITH GFR
BUN: 51 mg/dL — ABNORMAL HIGH (ref 6–23)
CO2: 32 mEq/L (ref 19–32)
Calcium: 9.8 mg/dL (ref 8.4–10.5)
Chloride: 98 mEq/L (ref 96–112)
Creat: 1.58 mg/dL — ABNORMAL HIGH (ref 0.50–1.10)
GFR, EST AFRICAN AMERICAN: 33 mL/min — AB
GFR, Est Non African American: 29 mL/min — ABNORMAL LOW
GLUCOSE: 70 mg/dL (ref 70–99)
POTASSIUM: 4.7 meq/L (ref 3.5–5.3)
SODIUM: 143 meq/L (ref 135–145)

## 2014-03-02 LAB — TSH: TSH: 2.619 u[IU]/mL (ref 0.350–4.500)

## 2014-03-02 LAB — HEMOGLOBIN A1C
Hgb A1c MFr Bld: 5.9 % — ABNORMAL HIGH (ref <5.7)
MEAN PLASMA GLUCOSE: 123 mg/dL — AB (ref <117)

## 2014-03-08 ENCOUNTER — Ambulatory Visit: Payer: PRIVATE HEALTH INSURANCE | Admitting: Cardiology

## 2014-03-22 ENCOUNTER — Telehealth: Payer: Self-pay | Admitting: *Deleted

## 2014-03-22 NOTE — Telephone Encounter (Signed)
Patient called and states she is having excessive dry mouth since starting Oxybutnin.  She is not able to eat due to the dry mouth. Patient asked if she can try stopping the med to see if she is able to eat.  OK per Dr Melford Aase.

## 2014-04-04 ENCOUNTER — Other Ambulatory Visit (HOSPITAL_COMMUNITY): Payer: Self-pay

## 2014-04-04 ENCOUNTER — Telehealth (HOSPITAL_COMMUNITY): Payer: Self-pay

## 2014-04-04 NOTE — Telephone Encounter (Signed)
Patient called asking to stop her hydralazine.  BP has been 120/60s after stopping for a few days, says it is hard to afford all of her medications.  Per Dr. Aundra Dubin this is okay, instructed patient to check her BP periodically to ensure it does not go back up.  Aware and agreeable.  Renee Pain

## 2014-04-16 ENCOUNTER — Emergency Department (HOSPITAL_COMMUNITY)
Admission: EM | Admit: 2014-04-16 | Discharge: 2014-04-16 | Disposition: A | Discharge status: Home / Self Care | Payer: Medicare Other | Attending: Emergency Medicine | Admitting: Emergency Medicine

## 2014-04-16 ENCOUNTER — Encounter (HOSPITAL_COMMUNITY): Payer: Self-pay | Admitting: *Deleted

## 2014-04-16 DIAGNOSIS — Z8739 Personal history of other diseases of the musculoskeletal system and connective tissue: Secondary | ICD-10-CM | POA: Diagnosis not present

## 2014-04-16 DIAGNOSIS — Z79899 Other long term (current) drug therapy: Secondary | ICD-10-CM | POA: Diagnosis not present

## 2014-04-16 DIAGNOSIS — Z7982 Long term (current) use of aspirin: Secondary | ICD-10-CM | POA: Diagnosis not present

## 2014-04-16 DIAGNOSIS — Z87891 Personal history of nicotine dependence: Secondary | ICD-10-CM | POA: Insufficient documentation

## 2014-04-16 DIAGNOSIS — Z8781 Personal history of (healed) traumatic fracture: Secondary | ICD-10-CM | POA: Diagnosis not present

## 2014-04-16 DIAGNOSIS — I129 Hypertensive chronic kidney disease with stage 1 through stage 4 chronic kidney disease, or unspecified chronic kidney disease: Secondary | ICD-10-CM | POA: Insufficient documentation

## 2014-04-16 DIAGNOSIS — I5032 Chronic diastolic (congestive) heart failure: Secondary | ICD-10-CM | POA: Diagnosis not present

## 2014-04-16 DIAGNOSIS — E039 Hypothyroidism, unspecified: Secondary | ICD-10-CM | POA: Diagnosis not present

## 2014-04-16 DIAGNOSIS — Z8673 Personal history of transient ischemic attack (TIA), and cerebral infarction without residual deficits: Secondary | ICD-10-CM | POA: Diagnosis not present

## 2014-04-16 DIAGNOSIS — I10 Essential (primary) hypertension: Secondary | ICD-10-CM

## 2014-04-16 DIAGNOSIS — E785 Hyperlipidemia, unspecified: Secondary | ICD-10-CM | POA: Diagnosis not present

## 2014-04-16 DIAGNOSIS — N189 Chronic kidney disease, unspecified: Secondary | ICD-10-CM | POA: Diagnosis not present

## 2014-04-16 NOTE — ED Provider Notes (Signed)
CSN: [id number]     Arrival date & time 04/16/14  4008 History   First MD Initiated Contact with Patient 04/16/14 0710     Chief Complaint  Patient presents with  . Hypertension      HPI Pt was seen at 0730. Per pt, c/o gradual onset and persistence of multiple intermittent episodes of "high blood pressure" for the past 2 to 3 days. Pt states she self-d/c her hydralazine (as rx by her Cards MD) 2 weeks ago due to expense. Pt states she "got a new BP machine" so she has been checking her BP several times per day. Pt states her SBP has been "200's." Pt denies any symptoms other than "I got worried when I saw my blood pressure going up." Pt states she took her morning meds at 0600 PTA, then took her BP "shortly after that a couple of times" before coming to the ED for evaluation. Denies CP/palpitations, no SOB/cough, no abd pain, no N/V/D, no visual changes, no focal motor weakness, no tingling/numbness in extremities.     Past Medical History  Diagnosis Date  . Vitamin D deficiency   . Diverticulosis   . Coronary artery calcification seen on CAT scan   . TIA (transient ischemic attack)     Carotid dopplers (8/11): 40-50% RICA stenosis. Carotid dopplers (12/13) with 40-59% bilateral ICA stenoses.   . CKD (chronic kidney disease)   . Inguinal hernia   . Broken hip     bilateral  . Hypertension   . Hyperlipidemia   . Hypothyroidism   . Senile osteoporosis   . Osteopenia   . Elevated hemoglobin A1c   . Bradycardia     Holter monitor (8/11) with no significant bradycardia, frequent PACs, few short runs of slow atrial tachycardia.   . Chronic diastolic heart failure     a. Echo (6/15):  EF 60-65%, Gr 2 DD, mild AS (mean 8 mmHg);  b.  Echo (11/2013):  mild LVH, EF 55-60%, no RWMA, Gr 1 DD, mild AS (peak 19 mmHg), mild MR, trivial eff   . Aortic stenosis     a. mild (echo 6/15 with mean gradient 8 mmHg)  . Carotid stenosis     a. Carotid US (04/2012):  bilateral ICA 40-59%;  b.  Carotid  US (8/15): Bilateral ICA 40-59% - follow up 1 year    Past Surgical History  Procedure Laterality Date  . Inguinal hernia repair    . Tonsillectomy    . Hip arthroplasty  04/01/2012    Procedure: ARTHROPLASTY BIPOLAR HIP;  Surgeon: Kerin Salen, MD;  Location: [facility];  Service: Orthopedics;  Laterality: Left;  . Compression hip screw  05/06/2012    Procedure: COMPRESSION HIP;  Surgeon: Jolyn Nap, MD;  Location: [facility];  Service: Orthopedics;  Laterality: Right;  . Skin lesion excision      benignt; from shoulder  . Hip surgery      left  . Eye surgery      bilateral  . Eye surgery      tear duct R side  . Blepharoplasty  1983  . Melanoma excision  1991    Right Arm  . Total hip arthroplasty Right 2013  . Total hip arthroplasty Left 2013  . Esophagogastroduodenoscopy N/A 11/12/2013    Procedure: ESOPHAGOGASTRODUODENOSCOPY (EGD);  Surgeon: Jerene Bears, MD;  [facility];  Service: Endoscopy;  Laterality: N/A;   Family History  Problem Relation Age of Onset  . CVA Mother  died age 65  . Emphysema Sister   . Colon cancer Neg Hx   . Other      patient does not know   History  Substance Use Topics  . Smoking status: Former Smoker    Types: Cigarettes    Quit date: 05/06/1971  . Smokeless tobacco: Never Used  . Alcohol Use: No     Comment: none since Nov 2013-was on 2 beers/night    Review of Systems ROS: Statement: All systems negative except as marked or noted in the HPI; Constitutional: Negative for fever and chills. ; ; Eyes: Negative for eye pain, redness and discharge. ; ; ENMT: Negative for ear pain, hoarseness, nasal congestion, sinus pressure and sore throat. ; ; Cardiovascular: Negative for chest pain, palpitations, diaphoresis, dyspnea and peripheral edema. ; ; Respiratory: Negative for cough, wheezing and stridor. ; ; Gastrointestinal: Negative for nausea, vomiting, diarrhea, abdominal pain, blood in stool, hematemesis, jaundice and rectal  bleeding. . ; ; Genitourinary: Negative for dysuria, flank pain and hematuria. ; ; Musculoskeletal: Negative for back pain and neck pain. Negative for swelling and trauma.; ; Skin: Negative for pruritus, rash, abrasions, blisters, bruising and skin lesion.; ; Neuro: Negative for headache, lightheadedness and neck stiffness. Negative for weakness, altered level of consciousness , altered mental status, extremity weakness, paresthesias, involuntary movement, seizure and syncope.      Allergies  Aricept; Prevacid; and Tylenol  Home Medications   Prior to Admission medications   Medication Sig Start Date End Date Taking? Authorizing Provider  allopurinol (ZYLOPRIM) 100 MG tablet Take 100 mg by mouth every morning.    Historical Provider, MD  aspirin EC 81 MG tablet Take 1 tablet (81 mg total) by mouth daily. 01/21/14   Larey Dresser, MD  atorvastatin (LIPITOR) 20 MG tablet Take 1 tablet (20 mg total) by mouth daily. 01/07/14   Unk Pinto, MD  Biotin 1000 MCG tablet Take 1,000 mcg by mouth every morning.     Historical Provider, MD  bisoprolol (ZEBETA) 10 MG tablet Take 1 tablet (10 mg total) by mouth daily. 12/02/13   Kinnie Feil, MD  cholecalciferol (VITAMIN D) 1000 UNITS tablet Take 1,000 Units by mouth every morning.     Historical Provider, MD  cloNIDine (CATAPRES) 0.2 MG tablet Take 1 tablet (0.2 mg total) by mouth 3 (three) times daily. 12/06/13 12/07/14  Unk Pinto, MD  furosemide (LASIX) 40 MG tablet 2 tabs twice daily = 80 mg twice daily 12/20/13 12/21/14  Liliane Shi, PA-C  levothyroxine (SYNTHROID, LEVOTHROID) 175 MCG tablet Take 1 tablet (175 mcg total) by mouth daily before breakfast. 12/31/13   Unk Pinto, MD  Multiple Vitamin (MULTIVITAMIN WITH MINERALS) TABS Take 1 tablet by mouth every morning.     Historical Provider, MD  oxybutynin (DITROPAN) 5 MG tablet Take 1 tablet (5 mg total) by mouth 3 (three) times daily. For overactive bladdder 01/31/14   Unk Pinto,  MD  pantoprazole (PROTONIX) 40 MG tablet Take 1 tablet (40 mg total) by mouth 2 (two) times daily before a meal. 11/15/13   Theodis Blaze, MD  polyvinyl alcohol (LIQUIFILM TEARS) 1.4 % ophthalmic solution Place 1-2 drops into both eyes as needed for dry eyes.    Historical Provider, MD  potassium chloride SA (K-DUR,KLOR-CON) 20 MEQ tablet Take 1 tablet (20 mEq total) by mouth 2 (two) times daily. 01/22/14   Larey Dresser, MD  Propylene Glycol (SYSTANE BALANCE OP) Apply 1-2 drops to eye 3 (three) times  daily.    Historical Provider, MD  sodium chloride (MURO 128) 5 % ophthalmic solution Place 1 drop into both eyes at bedtime.    Historical Provider, MD  traZODone (DESYREL) 25 mg TABS tablet Take 0.5 tablets (25 mg total) by mouth at bedtime as needed for sleep. 12/02/13   Kinnie Feil, MD   BP 182/48 mmHg  Pulse 59  Temp(Src) 97.4 F (36.3 C) (Oral)  Resp 18  SpO2 100%  BP 177/59 mmHg  Pulse 55  Temp(Src) 97.7 F (36.5 C) (Oral)  Resp 16  SpO2 100%  Physical Exam  0735: Physical examination:  Nursing notes reviewed; Vital signs and O2 SAT reviewed;  Constitutional: Well developed, Well nourished, Well hydrated, In no acute distress; Head:  Normocephalic, atraumatic; Eyes: EOMI, PERRL, No scleral icterus; ENMT: Mouth and pharynx normal, Mucous membranes moist; Neck: Supple, Full range of motion, No lymphadenopathy; Cardiovascular: Regular rate and rhythm, No gallop; Respiratory: Breath sounds clear & equal bilaterally, No rales, rhonchi, wheezes.  Speaking full sentences with ease, Normal respiratory effort/excursion; Chest: Nontender, Movement normal; Abdomen: Soft, Nontender, Nondistended, Normal bowel sounds; Genitourinary: No CVA tenderness; Extremities: Pulses normal, No tenderness, No edema, No calf edema or asymmetry.; Neuro: AA&Ox3, Major CN grossly intact. No facial droop. Speech clear. No gross focal motor or sensory deficits in extremities. Climbs on and off stretcher easily by  herself. Gait steady.; Skin: Color normal, Warm, Dry.   ED Course  Procedures     MDM  MDM Reviewed: previous chart, nursing note and vitals      0750:  Pt states she "feels fine" and "just got a little anxious when I saw my blood pressure go up." Pt states she has "calmed down" since coming to the ED and talking with ED staff and "just wants to go home now." Pt already has f/u PMD appt on 04/23/14. Instructed pt to go to PMD office and have RN check her new BP machine against manual BP reading. Verb understanding. Pt will also continue to keep a diary of her BP readings to show to her PMD at her next office visit. D/W pt also regarding self d/c her hydralazine 2 weeks ago. Pt again verb understanding and thankful for ED staff support.     Francine Graven, DO 04/19/14 2200

## 2014-04-16 NOTE — Discharge Instructions (Signed)
°Emergency Department Resource Guide °1) Find a Doctor and Pay Out of Pocket °Although you won't have to find out who is covered by your insurance plan, it is a good idea to ask around and get recommendations. You will then need to call the office and see if the doctor you have chosen will accept you as a new patient and what types of options they offer for patients who are self-pay. Some doctors offer discounts or will set up payment plans for their patients who do not have insurance, but you will need to ask so you aren't surprised when you get to your appointment. ° °2) Contact Your Local Health Department °Not all health departments have doctors that can see patients for sick visits, but many do, so it is worth a call to see if yours does. If you don't know where your local health department is, you can check in your phone book. The CDC also has a tool to help you locate your state's health department, and many state websites also have listings of all of their local health departments. ° °3) Find a Walk-in Clinic °If your illness is not likely to be very severe or complicated, you may want to try a walk in clinic. These are popping up all over the country in pharmacies, drugstores, and shopping centers. They're usually staffed by nurse practitioners or physician assistants that have been trained to treat common illnesses and complaints. They're usually fairly quick and inexpensive. However, if you have serious medical issues or chronic medical problems, these are probably not your best option. ° °No Primary Care Doctor: °- Call [facility] at  [phone] - they can help you locate a primary care doctor that  accepts your insurance, provides certain services, etc. °- Physician Referral Service- 1-800-533-3463 ° °Chronic Pain Problems: °Organization         Address  Phone   Notes  °[facility]  ([phone]) [phone] Patients need to be referred by their primary care doctor.  ° °Medication  Assistance: °Organization         Address  Phone   Notes  °[place] [facility] [address] °[place], [place] [address] (336) 641-8030 --Must be a resident of [place] °-- Must have NO insurance coverage whatsoever (no Medicaid/ Medicare, etc.) °-- The pt. MUST have a primary care doctor that directs their care regularly and follows them in the community °  °[facility]  (866) 331-1348   °[employer]  (888) 892-1162   ° °Agencies that provide inexpensive medical care: °Organization         Address  Phone   Notes  °[facility]  (336) 832-8035   °[facility]    (336) 832-7272   °[facility] [address] °[place], [place] [address] (336) 832-4777   °[facility] [address], °[place] (336) 271-4999   °[facility]    (336) 373-0678   °[facility]    (336) 272-1050   °[facility] ° [address], [place] Phone:  (336) 832-4444, Fax:  (336) 832-4440 Hours of Operation:  9 am - 6 pm, M-F.  Also accepts Medicaid/Medicare and self-pay.  °[facility] ° [address], [place] Phone: (336) [phone], Fax: (336) [phone]. Hours of Operation:  8:30 am - 5:30 pm, M-F.  Also accepts Medicaid and self-pay.  °[facility] [place] [address], [place] Phone: (336) 878-6027   °[facility] [address], [place], [place] (336)723-1848, Ext. 123 Mondays & Thursdays: 7-9 AM.  First 15 patients are seen on a first come, first serve basis. °  ° °Medicaid-accepting [place] Providers: ° °Organization         Address  Phone   Notes  °[facility] [address], [place] (336) 641-2100 Also accepts self-pay patients.  °[facility] [address], [place] ° (336) 856-9996   °[facility] [address], [place]  (336) 288-8857   °[facility] [address] [place] [address], [place] (336) 299-7000   °Veita Bland [address], [place]  ° (336) [phone] Only accepts [place] Access Medicaid patients after they have their name applied to their card.  ° °Self-Pay (no insurance) in [place]: ° °Organization         Address  Phone   Notes  °Sickle Cell Patients, [facility] [address], [place] (336) 832-1970   °[facility] [address], [place] (336) 832-4400   °[facility] ° [address], [place] (336) 992-4800   °[facility]/Dr. Osei-Bonsu ° [address] [place] [address], [place] or [address], [place] (336) [phone] Phone number for both [place] and [place] locations is the same.  °[facility] [address], [place] (336) 299-0000   °[facility] [address] [place] [address], [place] or [address] (336) 852-7530 °(336) 878-2260   °[facility] [address], [place] (336) 350-1642, phone; (336) 294-5005, fax Sees patients 1st and 3rd Saturday of every month.  Must not qualify for public or private insurance (i.e. Medicaid, Medicare, [place] Health Choice, Veterans' Benefits) • Household income should be no more than 200% of the poverty level •The clinic cannot treat you if you are pregnant or think you are pregnant • Sexually transmitted diseases are not treated at the clinic.  ° ° °Dental Care: °Organization         Address  Phone  Notes  °[place] [facility] [address], [place] (336) 641-6152 Accepts children up to age 21 who are enrolled in Medicaid or [place] Health Choice; pregnant women with a Medicaid card; and children who have applied for Medicaid or [place] Health Choice, but were declined, whose parents can pay a reduced fee at time of service.  °[place]  [facility] [place]  [address], [place] (336) 641-7733 Accepts children up to age 21 who are enrolled in Medicaid or [place] Health Choice; pregnant women with a Medicaid card; and children who have applied for Medicaid or [place] Health Choice, but were declined, whose parents can pay a reduced fee at time of service.  °[facility] ° [address], [place] (336) 641-4533 Patients are seen by appointment only. Walk-ins are not accepted. [facility] will see patients 18 years of age and older. °Monday - Tuesday (8am-5pm) °Most Wednesdays (8:30-5pm) °$30 per visit, cash only  °[facility] ° [address], [place] (336) [phone] Patients are seen by appointment only. Walk-ins are not accepted. [facility] will see patients 18 years of age and older. °One   Wednesday Evening (Monthly: Volunteer Based).  $30 per visit, cash only  °[facility]  (919) 537-3737 for adults; Children under age 4, call Graduate Pediatric Dentistry at ([phone]) [phone]. Children aged 4-14, please call ([phone]) [phone] to request a pediatric application. ° Dental services are provided in all areas of dental care including fillings, crowns and bridges, complete and partial dentures, implants, gum treatment, root canals, and extractions. Preventive care is also provided. Treatment is provided to both adults and children. °Patients are selected via a lottery and there is often a waiting list. °  °[facility] [address], °[place] ° (336) 763-8833 [email or web address] °  °[facility] [address], [place], [place] (336)723-1848, Ext. 123 Second and Fourth Thursday of each month, opens at 6:30 AM; Clinic ends at 9 AM.  Patients are seen on a first-come first-served basis, and a limited number are seen during each clinic.  ° °[facility] ° [address], [place], [place] (336) [phone]    Eligibility Requirements °You must have lived in [place], [place], or [place] counties for at least the last three months. °  You cannot be eligible for state or federal sponsored healthcare insurance, including Veterans Administration, Medicaid, or Medicare. °  You generally cannot be eligible for healthcare insurance through your employer.  °  How to apply: °Eligibility screenings are held every Tuesday and Wednesday afternoon from 1:00 pm until 4:00 pm. You do not need an appointment for the interview!  °[facility] [address], [place], [place] 336-631-2330   °[facility]  336-342-8273   °[facility]  336-703-3100   °[facility]  336-570-6415   ° °Behavioral Health Resources in the Community: °Intensive Outpatient Programs °Organization         Address  Phone  Notes  °[facility] [address], [place], [place] 336-878-6098   °[facility] [address], [place], [place] 336-832-9800   °[facility] [address], [place], [place] ° 336-882-2125   °[facility] [address],  °[place], [place] 1-800-853-5163 or 336-641-4981   °Substance Abuse Resources °Organization         Address  Phone  Notes  °[facility]  336-882-2125   °[facility]  336-784-9470   °[facility]  336-285-9073   °[facility]  336-845-3988   °Residential & Outpatient Substance Abuse Program  1-800-659-3381   °Psychological Services °Organization         Address  Phone  Notes  °[facility]  336- 832-9600   °[facility]  336- 378-7881   °[facility] [address], [place] 1-800-853-5163 or 336-641-4981   ° °Mobile Crisis Teams °Organization         Address  Phone  Notes  °[facility], Mobile Crisis Care Unit  1-877-626-1772   °Assertive °[facility] ° [address]  [place], [place] 336-834-9664   °Sharon DeEsch [address] °[place]  336-554-5454   ° °Self-Help/Support Groups °Organization         Address  Phone             Notes  °[facility]  - variety of support groups  336- 373-1402 Call for more information  °Narcotics Anonymous (NA), [facility] [address], °[place]   2 meetings at this location  ° °  Residential Treatment Programs Organization         Address  Phone  Notes  [facility] [address],    [place]  1-630-588-5490   [facility]  [address], [place] [phone], [place], [place]   [place], [place] 281-615-5150 Admissions: 8am-3pm M-F  [facility] [place] [address]    [place], [place] 644-034-7425   [facility] [address] [place], [place], [place]   [facility] [address],  [place], [place]   [facility] Intensive Outpatient [address], [place], [place]   [facility]  [place], [place] 1-204-402-6268 or (509)687-0131   [facility] [address] [place], [place] Accepts Medicaid  [facility] [address]  [place] 1-(514) 227-6174 Substance Abuse/Addiction Treatment   [facility]         Address  Phone  Notes  [facility]  (240) 284-8694   Domenic Schwab, PhD [address] Arlis Porta [place], [place]   4070798762 or 281-365-9799   [facility] [place], [place] 8782274440   [facility] [address] [place], [place] (906)423-1808 Insurance/Medicaid/sponsorship through [facility] [address]                                    [place], [place] 579-717-1933 [place] [address], [place] (303)[phone]    Dr. Adele Schilder  938-278-2453   [facility] Dept. 1) [address], [place] 2) [place] 3)  [place] [address], [place] [phone] 281-304-3167  518-009-1044   [place] (817)133-8493 or ([phone] (After Hours)      Take your usual prescriptions as previously directed.  Take your blood pressure only once per day, either in the morning after you take your medicine(s) or in the evening before you go to bed, once or twice per week.  Always sit quietly for at least 15 minutes before taking your blood pressure.  Keep a diary of your blood pressures to show your doctor at your follow up office visit.  Call your regular medical doctor this morning to schedule a follow up appointment within the next 2 days. Try to also schedule a nurse visit to check your new BP machine readings against their manual blood pressure in the office. Return to the Emergency Department immediately sooner if worsening.

## 2014-04-16 NOTE — ED Notes (Signed)
Pt states that her BP has been high the last few days; pt states that she was started on HCTZ in Sept but stopped taking it 2 weeks ago per her Cardiologist; pt states that when she went to her appt in Sept her BP was elevated but she had gotten stuck in the elevator and she didn't feel she needed it; pt states that she has taken the HCTZ again for the last 3 days with no change in her BP; pt denies Chest pain, headache or any other sx.

## 2014-04-22 ENCOUNTER — Other Ambulatory Visit: Payer: Self-pay | Admitting: *Deleted

## 2014-04-22 MED ORDER — PANTOPRAZOLE SODIUM 40 MG PO TBEC: 40.0000 mg | DELAYED_RELEASE_TABLET | Freq: Two times a day (BID) | ORAL | Status: DC

## 2014-04-22 MED ORDER — ALLOPURINOL 100 MG PO TABS: 100.0000 mg | ORAL_TABLET | Freq: Every morning | ORAL | Status: DC

## 2014-05-01 ENCOUNTER — Other Ambulatory Visit: Payer: Self-pay | Admitting: *Deleted

## 2014-05-01 MED ORDER — LEVOTHYROXINE SODIUM 175 MCG PO TABS: 175.0000 ug | ORAL_TABLET | Freq: Every day | ORAL | Status: DC

## 2014-05-02 ENCOUNTER — Other Ambulatory Visit: Payer: Self-pay | Admitting: Physician Assistant

## 2014-05-02 ENCOUNTER — Ambulatory Visit (INDEPENDENT_AMBULATORY_CARE_PROVIDER_SITE_OTHER): Payer: Medicare Other | Admitting: Physician Assistant

## 2014-05-02 VITALS — BP 162/68 | HR 64 | Temp 97.7°F | Resp 18 | Ht 68.5 in | Wt 124.8 lb

## 2014-05-02 DIAGNOSIS — I10 Essential (primary) hypertension: Secondary | ICD-10-CM

## 2014-05-02 DIAGNOSIS — Z79899 Other long term (current) drug therapy: Secondary | ICD-10-CM

## 2014-05-02 DIAGNOSIS — M109 Gout, unspecified: Secondary | ICD-10-CM

## 2014-05-02 DIAGNOSIS — D649 Anemia, unspecified: Secondary | ICD-10-CM

## 2014-05-02 DIAGNOSIS — E039 Hypothyroidism, unspecified: Secondary | ICD-10-CM

## 2014-05-02 DIAGNOSIS — N3281 Overactive bladder: Secondary | ICD-10-CM

## 2014-05-02 LAB — CBC WITH DIFFERENTIAL/PLATELET
Basophils Absolute: 0 10*3/uL (ref 0.0–0.1)
Basophils Relative: 0 % (ref 0–1)
EOS ABS: 0.1 10*3/uL (ref 0.0–0.7)
Eosinophils Relative: 2 % (ref 0–5)
HCT: 31.3 % — ABNORMAL LOW (ref 36.0–46.0)
HEMOGLOBIN: 10.7 g/dL — AB (ref 12.0–15.0)
Lymphocytes Relative: 12 % (ref 12–46)
Lymphs Abs: 0.9 10*3/uL (ref 0.7–4.0)
MCH: 29.3 pg (ref 26.0–34.0)
MCHC: 34.2 g/dL (ref 30.0–36.0)
MCV: 85.8 fL (ref 78.0–100.0)
MPV: 11.1 fL (ref 9.4–12.4)
Monocytes Absolute: 0.5 10*3/uL (ref 0.1–1.0)
Monocytes Relative: 7 % (ref 3–12)
NEUTROS PCT: 79 % — AB (ref 43–77)
Neutro Abs: 5.7 10*3/uL (ref 1.7–7.7)
Platelets: 281 10*3/uL (ref 150–400)
RBC: 3.65 MIL/uL — ABNORMAL LOW (ref 3.87–5.11)
RDW: 14.3 % (ref 11.5–15.5)
WBC: 7.2 10*3/uL (ref 4.0–10.5)

## 2014-05-02 LAB — TSH: TSH: 1.727 u[IU]/mL (ref 0.350–4.500)

## 2014-05-02 LAB — MAGNESIUM: Magnesium: 2.3 mg/dL (ref 1.5–2.5)

## 2014-05-02 LAB — HEPATIC FUNCTION PANEL
ALK PHOS: 84 U/L (ref 39–117)
ALT: 26 U/L (ref 0–35)
AST: 33 U/L (ref 0–37)
Albumin: 3.9 g/dL (ref 3.5–5.2)
Bilirubin, Direct: 0.1 mg/dL (ref 0.0–0.3)
Indirect Bilirubin: 0.4 mg/dL (ref 0.2–1.2)
TOTAL PROTEIN: 5.9 g/dL — AB (ref 6.0–8.3)
Total Bilirubin: 0.5 mg/dL (ref 0.2–1.2)

## 2014-05-02 LAB — BASIC METABOLIC PANEL WITH GFR
BUN: 54 mg/dL — AB (ref 6–23)
CHLORIDE: 100 meq/L (ref 96–112)
CO2: 29 mEq/L (ref 19–32)
CREATININE: 1.77 mg/dL — AB (ref 0.50–1.10)
Calcium: 8.8 mg/dL (ref 8.4–10.5)
GFR, EST NON AFRICAN AMERICAN: 25 mL/min — AB
GFR, Est African American: 29 mL/min — ABNORMAL LOW
GLUCOSE: 114 mg/dL — AB (ref 70–99)
Potassium: 4.1 mEq/L (ref 3.5–5.3)
Sodium: 140 mEq/L (ref 135–145)

## 2014-05-02 LAB — URIC ACID: URIC ACID, SERUM: 6.4 mg/dL (ref 2.4–7.0)

## 2014-05-02 MED ORDER — HYDRALAZINE HCL 25 MG PO TABS: 25.0000 mg | ORAL_TABLET | Freq: Two times a day (BID) | ORAL | Status: DC

## 2014-05-02 NOTE — Progress Notes (Signed)
Assessment and Plan:  1. Essential hypertension - CBC with Differential - BASIC METABOLIC PANEL WITH GFR - Hepatic function panel  2. Hypothyroidism, unspecified hypothyroidism type Hypothyroidism-check TSH level, continue medications the same, reminded to take on an empty stomach 30-87mins before food.  - TSH  3. Medication management - Magnesium  4. OAB (overactive bladder) Try myrebetriq samples  5. Anemia, unspecified anemia type Check CBC  6. Gout without tophus, unspecified cause, unspecified chronicity, unspecified site ? Need for allopurinol - Uric acid  7. Hyperlipidemia Can stop the lipitor due to age  Continue diet and meds as discussed. Had labs drawn in Oct, will not get full labs today.   HPI 80 y.o. female  presents for 3 month follow up with hypertension, hyperlipidemia, prediabetes and vitamin D. Her blood pressure has been controlled at home, at home BP has been 140's/60's, today their BP is BP: (!) 162/68 mmHg recheck 148/58.  She does not workout. She denies chest pain, shortness of breath, dizziness.  She is on cholesterol medication and denies myalgias. Her cholesterol is at goal. The cholesterol last visit was:   Lab Results  Component Value Date   CHOL 102 12/01/2013   HDL 34* 12/01/2013   LDLCALC 46 12/01/2013   TRIG 108 12/01/2013   CHOLHDL 3.0 12/01/2013   She has been working on diet and exercise for prediabetes, and denies polydipsia, polyuria and visual disturbances. Last A1C in the office was:  Lab Results  Component Value Date   HGBA1C 5.9* 03/01/2014   Patient is on Vitamin D supplement.   Lab Results  Component Value Date   VD25OH 52 12/31/2013     She is on thyroid medication. Her medication was not changed last visit.  Lab Results  Component Value Date   TSH 2.619 03/01/2014  .  She states that she incontinence, leaking and urge incontinence, denies any UTI symptoms. She is not on oxybutynin. She is on allopurinol 100mg  for  gout.    Current Medications:  Current Outpatient Prescriptions on File Prior to Visit  Medication Sig Dispense Refill  . allopurinol (ZYLOPRIM) 100 MG tablet Take 1 tablet (100 mg total) by mouth every morning. 90 tablet 1  . aspirin EC 81 MG tablet Take 1 tablet (81 mg total) by mouth daily.    Marland Kitchen atorvastatin (LIPITOR) 20 MG tablet Take 1 tablet (20 mg total) by mouth daily. 30 tablet 3  . Biotin 1000 MCG tablet Take 1,000 mcg by mouth every morning.     . bisoprolol (ZEBETA) 10 MG tablet Take 1 tablet (10 mg total) by mouth daily. 30 tablet 3  . cholecalciferol (VITAMIN D) 1000 UNITS tablet Take 1,000 Units by mouth every morning.     . cloNIDine (CATAPRES) 0.2 MG tablet Take 1 tablet (0.2 mg total) by mouth 3 (three) times daily. 90 tablet 99  . furosemide (LASIX) 40 MG tablet 2 tabs twice daily = 80 mg twice daily 120 tablet 11  . levothyroxine (SYNTHROID, LEVOTHROID) 175 MCG tablet Take 1 tablet (175 mcg total) by mouth daily before breakfast. 30 tablet 2  . Multiple Vitamin (MULTIVITAMIN WITH MINERALS) TABS Take 1 tablet by mouth every morning.     Marland Kitchen oxybutynin (DITROPAN) 5 MG tablet Take 1 tablet (5 mg total) by mouth 3 (three) times daily. For overactive bladdder 90 tablet 99  . pantoprazole (PROTONIX) 40 MG tablet Take 1 tablet (40 mg total) by mouth 2 (two) times daily before a meal. 60 tablet 3  .  polyvinyl alcohol (LIQUIFILM TEARS) 1.4 % ophthalmic solution Place 1-2 drops into both eyes as needed for dry eyes.    . potassium chloride SA (K-DUR,KLOR-CON) 20 MEQ tablet Take 1 tablet (20 mEq total) by mouth 2 (two) times daily. 60 tablet 3  . Propylene Glycol (SYSTANE BALANCE OP) Apply 1-2 drops to eye 3 (three) times daily.    . sodium chloride (MURO 128) 5 % ophthalmic solution Place 1 drop into both eyes at bedtime.    . traZODone (DESYREL) 25 mg TABS tablet Take 0.5 tablets (25 mg total) by mouth at bedtime as needed for sleep. 15 tablet 0   No current facility-administered  medications on file prior to visit.   Medical History:  Past Medical History  Diagnosis Date  . Vitamin D deficiency   . Diverticulosis   . Coronary artery calcification seen on CAT scan   . TIA (transient ischemic attack)     Carotid dopplers (8/11): 40-50% RICA stenosis. Carotid dopplers (12/13) with 40-59% bilateral ICA stenoses.   . CKD (chronic kidney disease)   . Inguinal hernia   . Broken hip     bilateral  . Hypertension   . Hyperlipidemia   . Hypothyroidism   . Senile osteoporosis   . Osteopenia   . Elevated hemoglobin A1c   . Bradycardia     Holter monitor (8/11) with no significant bradycardia, frequent PACs, few short runs of slow atrial tachycardia.   . Chronic diastolic heart failure     a. Echo (6/15):  EF 60-65%, Gr 2 DD, mild AS (mean 8 mmHg);  b.  Echo (11/2013):  mild LVH, EF 55-60%, no RWMA, Gr 1 DD, mild AS (peak 19 mmHg), mild MR, trivial eff   . Aortic stenosis     a. mild (echo 6/15 with mean gradient 8 mmHg)  . Carotid stenosis     a. Carotid US (04/2012):  bilateral ICA 40-59%;  b.  Carotid US (8/15): Bilateral ICA 40-59% - follow up 1 year    Allergies:  Allergies  Allergen Reactions  . Aricept [Donepezil Hcl]     Nausea and vomiting  . Prevacid [Lansoprazole]     GI upset  . Tylenol [Acetaminophen]     Tylenol #3 - Nausea     Review of Systems:  Review of Systems  Constitutional: Negative.   HENT: Negative.   Respiratory: Positive for shortness of breath (unchanged). Negative for cough, hemoptysis, sputum production and wheezing.   Cardiovascular: Negative.   Gastrointestinal: Negative.   Genitourinary:       Incontinence  Musculoskeletal: Negative.   Skin: Negative.   Neurological: Negative.   Psychiatric/Behavioral: Negative.      Family history- Review and unchanged Social history- Review and unchanged Physical Exam: BP 162/68 mmHg  Pulse 64  Temp(Src) 97.7 F (36.5 C)  Resp 18  Ht 5' 8.5" (1.74 m)  Wt 124 lb 12.8 oz  (56.609 kg)  BMI 18.70 kg/m2 Wt Readings from Last 3 Encounters:  05/02/14 124 lb 12.8 oz (56.609 kg)  03/01/14 122 lb (55.339 kg)  02/19/14 119 lb 1.9 oz (54.032 kg)   General Appearance: Well nourished, in no apparent distress. Eyes: PERRLA, EOMs, conjunctiva no swelling or erythema Sinuses: No Frontal/maxillary tenderness ENT/Mouth: Ext aud canals clear, TMs without erythema, bulging. No erythema, swelling, or exudate on post pharynx.  Tonsils not swollen or erythematous. Hearing normal.  Neck: Supple, thyroid normal.  Respiratory: Respiratory effort normal, BS equal bilaterally without rales, rhonchi, wheezing or stridor.  Cardio: RRR with no MRGs. Brisk peripheral pulses without edema.  Abdomen: Soft, + BS.  Non tender, no guarding, rebound, hernias, masses. Lymphatics: Non tender without lymphadenopathy.  Musculoskeletal: Full ROM, 5/5 strength, normal gait.  Skin: Warm, dry without rashes, lesions, ecchymosis.  Neuro: Cranial nerves intact. Normal muscle tone, no cerebellar symptoms. Sensation intact.  Psych: Awake and oriented X 3, normal affect, Insight and Judgment appropriate.    Vicie Mutters, PA-C 11:50 AM [facility]

## 2014-05-02 NOTE — Patient Instructions (Signed)
Stop the lipitor/atrovastatin  Can try Myrebtriq samples 1/2-1 pill for your bladder.

## 2014-05-03 LAB — VITAMIN B12: Vitamin B-12: 760 pg/mL (ref 211–911)

## 2014-05-03 LAB — IRON AND TIBC
%SAT: 5 % — ABNORMAL LOW (ref 20–55)
Iron: 15 ug/dL — ABNORMAL LOW (ref 42–145)
TIBC: 331 ug/dL (ref 250–470)
UIBC: 316 ug/dL (ref 125–400)

## 2014-05-03 LAB — FERRITIN: Ferritin: 137 ng/mL (ref 10–291)

## 2014-05-22 ENCOUNTER — Other Ambulatory Visit (HOSPITAL_COMMUNITY): Payer: Self-pay

## 2014-05-22 ENCOUNTER — Telehealth (HOSPITAL_COMMUNITY): Payer: Self-pay | Admitting: Vascular Surgery

## 2014-05-22 MED ORDER — HYDRALAZINE HCL 50 MG PO TABS: 50.0000 mg | ORAL_TABLET | Freq: Three times a day (TID) | ORAL | Status: DC

## 2014-05-22 NOTE — Telephone Encounter (Signed)
Patient states SBP hangs around 200s, feels flushed at times.  Takes hydralazine 25mg  TID since 05/02/14 with no improvement.  Reviewed with pharmacist Pilar Plate and Darrick Grinder NP-C, instructed patient to increase hydralazine to 50mg  TID until we can see her in clinic at her upcoming appointment.  Asked to call us back in the next few days to reassess BP.  Aware and agreeable.  Rx sent to preferred pharmacy electronically.  Renee Pain

## 2014-05-22 NOTE — Telephone Encounter (Signed)
Pt bp has been running 200.Marland Kitchen Please advise

## 2014-06-02 ENCOUNTER — Other Ambulatory Visit: Payer: Self-pay | Admitting: Cardiology

## 2014-06-03 ENCOUNTER — Other Ambulatory Visit (HOSPITAL_COMMUNITY): Payer: Self-pay | Admitting: *Deleted

## 2014-06-03 DIAGNOSIS — E876 Hypokalemia: Secondary | ICD-10-CM

## 2014-06-03 MED ORDER — POTASSIUM CHLORIDE CRYS ER 20 MEQ PO TBCR: 20.0000 meq | EXTENDED_RELEASE_TABLET | Freq: Two times a day (BID) | ORAL | Status: DC

## 2014-06-05 ENCOUNTER — Ambulatory Visit (HOSPITAL_COMMUNITY)
Admission: RE | Admit: 2014-06-05 | Discharge: 2014-06-05 | Disposition: A | Discharge status: Home / Self Care | Payer: Medicare Other | Source: Ambulatory Visit | Attending: Internal Medicine | Admitting: Internal Medicine

## 2014-06-05 VITALS — BP 160/62 | HR 62 | Wt 122.0 lb

## 2014-06-05 DIAGNOSIS — I10 Essential (primary) hypertension: Secondary | ICD-10-CM

## 2014-06-05 DIAGNOSIS — Z8673 Personal history of transient ischemic attack (TIA), and cerebral infarction without residual deficits: Secondary | ICD-10-CM | POA: Diagnosis not present

## 2014-06-05 DIAGNOSIS — R55 Syncope and collapse: Secondary | ICD-10-CM | POA: Insufficient documentation

## 2014-06-05 DIAGNOSIS — I6529 Occlusion and stenosis of unspecified carotid artery: Secondary | ICD-10-CM | POA: Diagnosis not present

## 2014-06-05 DIAGNOSIS — Z79899 Other long term (current) drug therapy: Secondary | ICD-10-CM | POA: Insufficient documentation

## 2014-06-05 DIAGNOSIS — I5032 Chronic diastolic (congestive) heart failure: Secondary | ICD-10-CM | POA: Diagnosis not present

## 2014-06-05 DIAGNOSIS — I350 Nonrheumatic aortic (valve) stenosis: Secondary | ICD-10-CM | POA: Insufficient documentation

## 2014-06-05 DIAGNOSIS — E039 Hypothyroidism, unspecified: Secondary | ICD-10-CM | POA: Diagnosis not present

## 2014-06-05 DIAGNOSIS — Z7982 Long term (current) use of aspirin: Secondary | ICD-10-CM | POA: Diagnosis not present

## 2014-06-05 DIAGNOSIS — I129 Hypertensive chronic kidney disease with stage 1 through stage 4 chronic kidney disease, or unspecified chronic kidney disease: Secondary | ICD-10-CM | POA: Diagnosis not present

## 2014-06-05 DIAGNOSIS — N189 Chronic kidney disease, unspecified: Secondary | ICD-10-CM | POA: Diagnosis not present

## 2014-06-05 MED ORDER — HYDRALAZINE HCL 100 MG PO TABS: 100.0000 mg | ORAL_TABLET | Freq: Three times a day (TID) | ORAL | Status: DC

## 2014-06-05 NOTE — Patient Instructions (Signed)
Increase Hydralazine to 100 mg Three times a day, you can take 2 of your 50 mg tablets until you run out, then we have sent you in a new prescription for 100 mg tablets  Your physician recommends that you schedule a follow-up appointment in: 2 weeks for a nurse visit to recheck your BP  We will contact you in 3-4 months to schedule your next appointment.

## 2014-06-05 NOTE — Progress Notes (Signed)
Patient ID: Rachael [name], female   DOB: 1924-01-19, 81 y.o.   MRN: [id number] PCP: Dr. Melford Aase  81 yo with history of HTN, TIAs, and chronic diastolic CHF presents for cardiology followup.  She fractured both of her femurs, once in 11/13 and once in 12/13.  She had surgery both times.  She was then admitted in 5/14 with dehydration and acute on chronic renal failure.  During the admission with femur fracture in 12/13, she was told she had "fluid around her lungs."  She was taken off bumetanide when she was admitted with dehydration in 5/14 and put on Lasix later on.  In 6/15, she was admitted with upper GI bleed from erosive esophagitis and had AKI, diuretics were held.  In 7/15, she was then admitted with acute on chronic diastolic CHF and Lasix was restarted.  She has had several syncopal events and was set up for an event monitor by Richardson Dopp.  This showed only mild sinus bradycardia, no significant events.  She is off amlodipine due to leg swelling.   She returns for follow for elevated BP.  SBP at home has been 190-200 daily. In September she was started on hydralazine 25 mg tid however in November she stopped. She again started hydralazine again 05/22/14. Yesterday she forgot to take hydralazine at breakfast and lunch. Denies SOB/PND/Orthoopnea. Weight at home 118-119 pounds. Doing leg exercises at home.    Echo in 7/15 showed normal EF and mild AS.    Labs (5/14): K 4.3, creatinine 1.5 Labs (7/14): K 4.3, creatinine 1.5, LDL 55, HDL 51 Labs (8/15): K 3.5, creatinine 1.7, BNP 64, HCT 33.2  Labs (9/15): K 3.7, creatinine 1.5 Labs (05/02/2014): K 4.1 Creatinine 1.77   Allergies (verified):  No Known Drug Allergies   Past Medical History:  1. DIVERTICULOSIS, COLON   2. HYPOTHYROIDISM  3. HYPERTENSION  4. TIA: x 2, manifested by diplopia. Carotid dopplers (8/11): 40-50% RICA stenosis.  Carotid dopplers (12/13) with 40-59% bilateral ICA stenoses. Carotids (8/15) with bilateral ICA 40-59%  stenoses.  5. Bradycardia: Mild.  Holter monitor (8/11) with no significant bradycardia, frequent PACs, few short runs of slow atrial tachycardia.  6. Chronic diastolic CHF:  Echo (5/18): EF 65%, mild LV hypertrophy, aortic sclerosis without significant stenosis, mild mitral regurgitation.  Echo (12/13) with EF 65-70%, moderate TR, PA systolic pressure 55 mmHg, mild AS with mean gradient 17 mmHg. Echo (7/15) with EF 55-60%, mild LVH, mild AS, mild AI, mild MR.  7. Aortic stenosis: Mild on 7/15 echo.  8. Femur fractures in 11/13 and 12/13, had surgery for repair both times.   9. CKD 10. Upper GI bleed: 6/15, erosive esophagitis.  11. Syncope: Event monitor (8/15) with no significant events.   Family History:  No premature CAD. Mother with CVA in her 58s.   Social History:  Lives alone. Widowed. No children but has a nephew living in town. Nonsmoker.   ROS: All systems reviewed and negative except as per HPI.   Current Outpatient Prescriptions  Medication Sig Dispense Refill  . allopurinol (ZYLOPRIM) 100 MG tablet Take 1 tablet (100 mg total) by mouth every morning. 90 tablet 1  . aspirin EC 81 MG tablet Take 1 tablet (81 mg total) by mouth daily.    . Biotin 1000 MCG tablet Take 1,000 mcg by mouth every morning.     . bisoprolol (ZEBETA) 10 MG tablet Take 1 tablet (10 mg total) by mouth daily. 30 tablet 3  . cholecalciferol (VITAMIN  D) 1000 UNITS tablet Take 1,000 Units by mouth every morning.     . cloNIDine (CATAPRES) 0.2 MG tablet Take 1 tablet (0.2 mg total) by mouth 3 (three) times daily. 90 tablet 99  . furosemide (LASIX) 40 MG tablet 2 tabs twice daily = 80 mg twice daily 120 tablet 11  . hydrALAZINE (APRESOLINE) 50 MG tablet Take 1 tablet (50 mg total) by mouth 3 (three) times daily. 120 tablet 11  . levothyroxine (SYNTHROID, LEVOTHROID) 175 MCG tablet Take 1 tablet (175 mcg total) by mouth daily before breakfast. (Patient taking differently: Take 175 mcg by mouth daily before  breakfast. Take 1/2 tab on Sun, Tue, Thur, Sat) 30 tablet 2  . Multiple Vitamin (MULTIVITAMIN WITH MINERALS) TABS Take 1 tablet by mouth every morning.     . pantoprazole (PROTONIX) 40 MG tablet Take 1 tablet (40 mg total) by mouth 2 (two) times daily before a meal. 60 tablet 3  . polyvinyl alcohol (LIQUIFILM TEARS) 1.4 % ophthalmic solution Place 1-2 drops into both eyes as needed for dry eyes.    . potassium chloride SA (K-DUR,KLOR-CON) 20 MEQ tablet Take 1 tablet (20 mEq total) by mouth 2 (two) times daily. 60 tablet 3  . Propylene Glycol (SYSTANE BALANCE OP) Apply 1-2 drops to eye 3 (three) times daily.    . sodium chloride (MURO 128) 5 % ophthalmic solution Place 1 drop into both eyes at bedtime.     No current facility-administered medications for this encounter.    BP 160/62 mmHg  Pulse 62  Wt 122 lb (55.339 kg)  SpO2 97% General: NAD Neck: JVP 5-6 cm, no thyromegaly or thyroid nodule.  Lungs: Clear to auscultation bilaterally with normal respiratory effort. CV: Nondisplaced PMI.  Heart regular S1/S2, no S3/S4, 1/6 early systolic murmur RUSB.  Trace ankle edema.  Bilateral carotid bruits.  Normal pedal pulses.  Abdomen: Soft, nontender, no hepatosplenomegaly, no distention.  Neurologic: Alert and oriented x 3.  Psych: Normal affect. Extremities: No clubbing or cyanosis.   Assessment/Plan: 1. Chronic diastolic CHF: Volume status stable. Continue lasix 80mg  twice a day.  2. HTN: BP elevated. Increase hydralazine 100 mg TID. Continue bisoprolol 10 mg daily, clonidine 0.2 mg three times a day. Would not use  amlodipine due to leg edema. I have asked her to call back in 2 weeks to report BP.  3. Carotid stenosis: Repeat carotids in 8/16.    4. Aortic stenosis: Mild, continue to follow over time.  5. Syncope: No recurrence.  Event monitor showed no significant arrhythmias. Echo showed mild AS.    Follow up in 4 months in CHF clinic.   Shanina Kepple NP-C  06/05/2014

## 2014-06-17 ENCOUNTER — Ambulatory Visit (HOSPITAL_COMMUNITY)
Admission: RE | Admit: 2014-06-17 | Discharge: 2014-06-17 | Disposition: A | Discharge status: Home / Self Care | Payer: Medicare Other | Source: Ambulatory Visit | Attending: Cardiology | Admitting: Cardiology

## 2014-06-17 VITALS — BP 192/56 | HR 60 | Wt 120.4 lb

## 2014-06-17 DIAGNOSIS — Z7982 Long term (current) use of aspirin: Secondary | ICD-10-CM | POA: Insufficient documentation

## 2014-06-17 DIAGNOSIS — I5032 Chronic diastolic (congestive) heart failure: Secondary | ICD-10-CM | POA: Diagnosis present

## 2014-06-17 DIAGNOSIS — Z79899 Other long term (current) drug therapy: Secondary | ICD-10-CM | POA: Insufficient documentation

## 2014-06-17 DIAGNOSIS — N189 Chronic kidney disease, unspecified: Secondary | ICD-10-CM | POA: Insufficient documentation

## 2014-06-17 DIAGNOSIS — E039 Hypothyroidism, unspecified: Secondary | ICD-10-CM | POA: Insufficient documentation

## 2014-06-17 DIAGNOSIS — I6529 Occlusion and stenosis of unspecified carotid artery: Secondary | ICD-10-CM | POA: Diagnosis not present

## 2014-06-17 DIAGNOSIS — N183 Chronic kidney disease, stage 3 unspecified: Secondary | ICD-10-CM

## 2014-06-17 DIAGNOSIS — I129 Hypertensive chronic kidney disease with stage 1 through stage 4 chronic kidney disease, or unspecified chronic kidney disease: Secondary | ICD-10-CM | POA: Diagnosis not present

## 2014-06-17 DIAGNOSIS — I10 Essential (primary) hypertension: Secondary | ICD-10-CM

## 2014-06-17 DIAGNOSIS — I350 Nonrheumatic aortic (valve) stenosis: Secondary | ICD-10-CM | POA: Insufficient documentation

## 2014-06-17 DIAGNOSIS — F0390 Unspecified dementia without behavioral disturbance: Secondary | ICD-10-CM | POA: Insufficient documentation

## 2014-06-17 DIAGNOSIS — E876 Hypokalemia: Secondary | ICD-10-CM

## 2014-06-17 DIAGNOSIS — Z8673 Personal history of transient ischemic attack (TIA), and cerebral infarction without residual deficits: Secondary | ICD-10-CM | POA: Diagnosis not present

## 2014-06-17 MED ORDER — SPIRONOLACTONE 25 MG PO TABS: 25.0000 mg | ORAL_TABLET | Freq: Every day | ORAL | Status: DC

## 2014-06-17 MED ORDER — POTASSIUM CHLORIDE CRYS ER 20 MEQ PO TBCR: 20.0000 meq | EXTENDED_RELEASE_TABLET | Freq: Once | ORAL | Status: DC

## 2014-06-17 NOTE — Progress Notes (Signed)
Patient ID: Rachael [name], female   DOB: [birth year], 81 y.o.   MRN: [id number] PCP: Dr. Melford Aase  81 y.o. with history of HTN, TIAs, and chronic diastolic CHF presents for cardiology followup.  She fractured both of her femurs, once in 11/13 and once in 12/13.  She had surgery both times.  She was then admitted in 5/14 with dehydration and acute on chronic renal failure.  During the admission with femur fracture in 12/13, she was told she had "fluid around her lungs."  She was taken off bumetanide when she was admitted with dehydration in 5/14 and put on Lasix later on.  In 6/15, she was admitted with upper GI bleed from erosive esophagitis and had AKI, diuretics were held.  In 7/15, she was then admitted with acute on chronic diastolic CHF and Lasix was restarted.  She has had several syncopal events and was set up for an event monitor by Richardson Dopp.  This showed only mild sinus bradycardia, no significant events.  She is off amlodipine due to leg swelling.   She returns for follow for elevated BP.  SBP at home has been 190-200 daily. Last visit hydralazine was increased. Says she forgot her medications a couple days last week.  Denies SOB/PND/Orthoopnea. Weight at home 118-119 pounds. Doing leg exercises at home.    Echo in 7/15 showed normal EF and mild AS.    Labs (5/14): K 4.3, creatinine 1.5 Labs (7/14): K 4.3, creatinine 1.5, LDL 55, HDL 51 Labs (8/15): K 3.5, creatinine 1.7, BNP 64, HCT 33.2  Labs (9/15): K 3.7, creatinine 1.5 Labs (04/2014): K 4.1 Creatinine 1.77   Allergies (verified):  No Known Drug Allergies   Past Medical History:  1. DIVERTICULOSIS, COLON   2. HYPOTHYROIDISM  3. HYPERTENSION  4. TIA: x 2, manifested by diplopia. Carotid dopplers (8/11): 40-50% RICA stenosis.  Carotid dopplers (12/13) with 40-59% bilateral ICA stenoses. Carotids (8/15) with bilateral ICA 40-59% stenoses.  5. Bradycardia: Mild.  Holter monitor (8/11) with no significant bradycardia, frequent PACs,  few short runs of slow atrial tachycardia.  6. Chronic diastolic CHF:  Echo (4/16): EF 65%, mild LV hypertrophy, aortic sclerosis without significant stenosis, mild mitral regurgitation.  Echo (12/13) with EF 65-70%, moderate TR, PA systolic pressure 55 mmHg, mild AS with mean gradient 17 mmHg. Echo (7/15) with EF 55-60%, mild LVH, mild AS, mild AI, mild MR.  7. Aortic stenosis: Mild on 7/15 echo.  8. Femur fractures in 11/13 and 12/13, had surgery for repair both times.   9. CKD 10. Upper GI bleed: 6/15, erosive esophagitis.  11. Syncope: Event monitor (8/15) with no significant events.  12. Dementia: Mild.   Family History:  No premature CAD. Mother with CVA in her 79s.   Social History:  Lives alone. Widowed. No children but has a nephew living in town. Nonsmoker.   ROS: All systems reviewed and negative except as per HPI.   Current Outpatient Prescriptions  Medication Sig Dispense Refill  . allopurinol (ZYLOPRIM) 100 MG tablet Take 1 tablet (100 mg total) by mouth every morning. 90 tablet 1  . aspirin EC 81 MG tablet Take 1 tablet (81 mg total) by mouth daily.    . Biotin 1000 MCG tablet Take 1,000 mcg by mouth every morning.     . bisoprolol (ZEBETA) 10 MG tablet Take 1 tablet (10 mg total) by mouth daily. 30 tablet 3  . cholecalciferol (VITAMIN D) 1000 UNITS tablet Take 1,000 Units by mouth every morning.     Marland Kitchen  cloNIDine (CATAPRES) 0.2 MG tablet Take 1 tablet (0.2 mg total) by mouth 3 (three) times daily. 90 tablet 99  . furosemide (LASIX) 40 MG tablet 2 tabs twice daily = 80 mg twice daily 120 tablet 11  . hydrALAZINE (APRESOLINE) 100 MG tablet Take 1 tablet (100 mg total) by mouth 3 (three) times daily. 90 tablet 3  . levothyroxine (SYNTHROID, LEVOTHROID) 175 MCG tablet Take 1 tablet (175 mcg total) by mouth daily before breakfast. (Patient taking differently: Take 175 mcg by mouth daily before breakfast. Take 1/2 tab on Sun, Tue, Thur, Sat) 30 tablet 2  . Multiple Vitamin  (MULTIVITAMIN WITH MINERALS) TABS Take 1 tablet by mouth every morning.     . pantoprazole (PROTONIX) 40 MG tablet Take 1 tablet (40 mg total) by mouth 2 (two) times daily before a meal. 60 tablet 3  . polyvinyl alcohol (LIQUIFILM TEARS) 1.4 % ophthalmic solution Place 1-2 drops into both eyes as needed for dry eyes.    . potassium chloride SA (K-DUR,KLOR-CON) 20 MEQ tablet Take 1 tablet (20 mEq total) by mouth 2 (two) times daily. 60 tablet 3  . Propylene Glycol (SYSTANE BALANCE OP) Apply 1-2 drops to eye 3 (three) times daily.    . sodium chloride (MURO 128) 5 % ophthalmic solution Place 1 drop into both eyes at bedtime.     No current facility-administered medications for this encounter.    BP 192/56 mmHg  Pulse 60  Wt 120 lb 6.4 oz (54.613 kg)  SpO2 99% General: NAD Neck: JVP 5-6 cm, no thyromegaly or thyroid nodule.  Lungs: Clear to auscultation bilaterally with normal respiratory effort. CV: Nondisplaced PMI.  Heart regular S1/S2, no S3/S4, 2/6 early systolic murmur RUSB.  Trace ankle edema.  Bilateral carotid bruits.  Normal pedal pulses.  Abdomen: Soft, nontender, no hepatosplenomegaly, no distention.  Neurologic: Alert and oriented x 3.  Psych: Normal affect. Extremities: No clubbing or cyanosis.   Assessment/Plan: 1. Chronic diastolic CHF: Volume status stable. Continue lasix 80mg  twice a day.  Cut back potassium 20 meq daily.   2. HTN: BP elevated. Increase hydralazine 100 mg TID. Continue bisoprolol 10 mg daily, clonidine 0.2 mg three times a day. Would not use CCB due to leg edema.  Add 25 mg spironolactone daily due to resistant HTN. Check renal artery dopplers.  3. Carotid stenosis: Repeat carotids in 8/16.    4. Aortic stenosis: Mild, continue to follow over time.  5. Syncope: No recurrence.  Event monitor showed no significant arrhythmias. Echo showed mild AS. 6. Memory Impairment: in the past on aricept . Forgot medications 2 days last week. I wonder if this  contributing to uncontrolled HTN.  .    Follow up in 1 week for BMET and BP check and follow up in 1 month for regular visit.    Refer to paramedicine for HTN and Diastolic Heart Failure.   CLEGG,AMY NP-C  06/17/2014   Patient seen with NP, agree with the above note.  BP very high today, has been high at home as well.  She says she has been taking her meds as ordered and has a pill box.  Last week she missed some meds 2 days.  She has resistant HTN on multiple meds.   - Add spironolactone 25 mg daily and cut back on KCl to 20 daily.  BMET 1 week.  - BP check in office in 1 week.  - Check renal artery dopplers - Dementia may be playing a role here  though in conversation with her it does not seem particularly pronounced.  I agree that she would be a good candidate for paramedicine, will arrange paramedicine followup.   Return to office 1 month.   Loralie Champagne 06/17/2014 11:01 AM

## 2014-06-17 NOTE — Patient Instructions (Signed)
DECREASE Potassium to 20 MeQ daily START Spironolactone 25 mg daily  Your physician has requested that you have a renal artery duplex. During this test, an ultrasound is used to evaluate blood flow to the kidneys. Allow one hour for this exam. Do not eat after midnight the day before and avoid carbonated beverages. Take your medications as you usually do.  You have been referred to Paramedicine Program for additional assistance with medication management  Your physician recommends that you schedule a follow-up appointment in: 1 week for labs

## 2014-06-19 ENCOUNTER — Ambulatory Visit (HOSPITAL_COMMUNITY): Payer: Medicare Other | Attending: Cardiovascular Disease | Admitting: Cardiology

## 2014-06-19 DIAGNOSIS — I700 Atherosclerosis of aorta: Secondary | ICD-10-CM | POA: Insufficient documentation

## 2014-06-19 DIAGNOSIS — I10 Essential (primary) hypertension: Secondary | ICD-10-CM

## 2014-06-19 DIAGNOSIS — N183 Chronic kidney disease, stage 3 unspecified: Secondary | ICD-10-CM

## 2014-06-19 DIAGNOSIS — I701 Atherosclerosis of renal artery: Secondary | ICD-10-CM | POA: Insufficient documentation

## 2014-06-19 NOTE — Progress Notes (Signed)
Renal artery duplex performed  

## 2014-06-21 ENCOUNTER — Other Ambulatory Visit: Payer: Self-pay | Admitting: *Deleted

## 2014-06-21 DIAGNOSIS — E785 Hyperlipidemia, unspecified: Secondary | ICD-10-CM

## 2014-06-21 MED ORDER — ATORVASTATIN CALCIUM 20 MG PO TABS: 20.0000 mg | ORAL_TABLET | Freq: Every day | ORAL | Status: DC

## 2014-06-26 ENCOUNTER — Telehealth: Payer: Self-pay | Admitting: *Deleted

## 2014-06-26 ENCOUNTER — Ambulatory Visit (HOSPITAL_COMMUNITY)
Admission: RE | Admit: 2014-06-26 | Discharge: 2014-06-26 | Disposition: A | Discharge status: Home / Self Care | Payer: Medicare Other | Source: Ambulatory Visit | Attending: Internal Medicine | Admitting: Internal Medicine

## 2014-06-26 DIAGNOSIS — R609 Edema, unspecified: Secondary | ICD-10-CM

## 2014-06-26 DIAGNOSIS — I10 Essential (primary) hypertension: Secondary | ICD-10-CM

## 2014-06-26 DIAGNOSIS — I5022 Chronic systolic (congestive) heart failure: Secondary | ICD-10-CM

## 2014-06-26 DIAGNOSIS — I5032 Chronic diastolic (congestive) heart failure: Secondary | ICD-10-CM | POA: Diagnosis not present

## 2014-06-26 LAB — BASIC METABOLIC PANEL
Anion gap: 14 (ref 5–15)
BUN: 60 mg/dL — AB (ref 6–23)
CHLORIDE: 96 mmol/L (ref 96–112)
CO2: 30 mmol/L (ref 19–32)
CREATININE: 2.29 mg/dL — AB (ref 0.50–1.10)
Calcium: 9.7 mg/dL (ref 8.4–10.5)
GFR calc Af Amer: 21 mL/min — ABNORMAL LOW (ref >90)
GFR calc non Af Amer: 18 mL/min — ABNORMAL LOW (ref >90)
Glucose, Bld: 123 mg/dL — ABNORMAL HIGH (ref 70–99)
Potassium: 3.4 mmol/L — ABNORMAL LOW (ref 3.5–5.1)
SODIUM: 140 mmol/L (ref 135–145)

## 2014-06-26 MED ORDER — POTASSIUM CHLORIDE CRYS ER 20 MEQ PO TBCR: EXTENDED_RELEASE_TABLET | ORAL | Status: DC

## 2014-06-26 NOTE — Telephone Encounter (Signed)
Patient notified of lab results specifically Potassium level of 3.4 (L). Notified Dr. Mare Ferrari. Orders obtained for patient to change dose of potassium chloride, as follows:  Take 20 mEq by mouth every other day, alternating with 40 mEq by mouth every other day (in divided doses of 20 mEq in AM and 20 mEq in PM). Patient also to return in one week for recheck of BMET. Patient verbalized understanding and agreement with change of treatment plan.

## 2014-06-28 MED ORDER — FUROSEMIDE 40 MG PO TABS: 80.0000 mg | ORAL_TABLET | Freq: Every day | ORAL | Status: DC

## 2014-06-28 NOTE — Telephone Encounter (Signed)
Per Dr. Aundra Dubin, advised to decrease lasix to 80mg  once daily.  Will recheck bmet at upcoming appointment with [place].  Renee Pain

## 2014-07-02 ENCOUNTER — Ambulatory Visit (INDEPENDENT_AMBULATORY_CARE_PROVIDER_SITE_OTHER): Payer: Medicare Other | Admitting: Cardiovascular Disease

## 2014-07-02 ENCOUNTER — Other Ambulatory Visit (HOSPITAL_COMMUNITY): Payer: Self-pay | Admitting: *Deleted

## 2014-07-02 ENCOUNTER — Encounter: Payer: Self-pay | Admitting: Cardiovascular Disease

## 2014-07-02 VITALS — BP 182/60 | HR 78 | Ht 68.5 in | Wt 121.0 lb

## 2014-07-02 DIAGNOSIS — I10 Essential (primary) hypertension: Secondary | ICD-10-CM

## 2014-07-02 DIAGNOSIS — I701 Atherosclerosis of renal artery: Secondary | ICD-10-CM

## 2014-07-02 DIAGNOSIS — I739 Peripheral vascular disease, unspecified: Secondary | ICD-10-CM

## 2014-07-02 MED ORDER — AMLODIPINE BESYLATE 5 MG PO TABS: 5.0000 mg | ORAL_TABLET | Freq: Every day | ORAL | Status: DC

## 2014-07-02 MED ORDER — POTASSIUM CHLORIDE CRYS ER 20 MEQ PO TBCR: 20.0000 meq | EXTENDED_RELEASE_TABLET | Freq: Every day | ORAL | Status: DC

## 2014-07-02 NOTE — Assessment & Plan Note (Signed)
The patient has severe right renal artery stenosis and borderline left renal artery stenosis. This certainly could be contributing to refractory hypertension and chronic kidney disease. However, given her age, it is difficult to determine the degree of benefit from revascularization. She also has what seems to be severe atherosclerosis affecting the aorta which certainly increases the risk of atheroembolism with the procedure in the setting of catheter manipulation. The risks vs. benefits of renal artery revascularization in this situation are not clear-cut. Controlling her blood pressure certainly might improve her congestive heart failure. I discussed this in detail with the patient. I elected to resume amlodipine at 5 mg once daily. This has been discontinued in the past due to leg edema. I will have her follow-up in 3 months and get input from Dr. Aundra Dubin as well. The patient also wants to think about her options.  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 This might be responsible for some of her weight loss. However, she denies any postprandial pain.

## 2014-07-02 NOTE — Patient Instructions (Signed)
Your physician has recommended you make the following change in your medication:  1. START Amlodipine 5mg  take one by mouth daily  Your physician recommends that you schedule a follow-up appointment in: 3 MONTHS with Dr Fletcher Anon

## 2014-07-02 NOTE — Telephone Encounter (Signed)
Pt's insurance will not cover potassium chloride will only cover klor-con, rx for klor-con sent to pharmacy, called pharmacy they states they received rx and it went through

## 2014-07-02 NOTE — Progress Notes (Signed)
PCP: Dr. Melford Aase Primary Cardiologist: Dr. Aundra Dubin  HPI:  81 yo who was referred by Dr. Aundra Dubin for evaluation of renal artery stenosis. She has known history of HTN, TIAs, and chronic diastolic CHF.    In 6/15, she was admitted with upper GI bleed from erosive esophagitis and had AKI, diuretics were held.  In 7/15, she was then admitted with acute on chronic diastolic CHF and Lasix was restarted.   Echo in 7/15 showed normal EF and mild AS.   She had hospital admissions for heart failure as well as volume depletion. Blood pressure has been very difficult to control in spite of multiple medications. Amlodipine was discontinued in the past due to leg edema. She does have chronic kidney disease as well. This has been relatively stable. The patient is frail but independent and lives by herself. She has no family members around. She does complain of inability to gain weight but denies any postprandial pain. She reports no leg claudication.  She underwent renal artery duplex which showed severe right renal artery stenosis and borderline significant left renal artery stenosis. There was significant stenosis in the distal aorta and significant disease affecting the SMA and celiac trunk.  Allergies  Allergen Reactions  . Aricept [Donepezil Hcl] Nausea And Vomiting    Nausea and vomiting  . Prevacid [Lansoprazole] Nausea And Vomiting    GI upset  . Tylenol [Acetaminophen] Nausea Only    Tylenol #3 - Nausea     Current Outpatient Prescriptions on File Prior to Visit  Medication Sig Dispense Refill  . allopurinol (ZYLOPRIM) 100 MG tablet Take 1 tablet (100 mg total) by mouth every morning. 90 tablet 1  . aspirin EC 81 MG tablet Take 1 tablet (81 mg total) by mouth daily.    Marland Kitchen atorvastatin (LIPITOR) 20 MG tablet Take 1 tablet (20 mg total) by mouth daily. 30 tablet 11  . Biotin 1000 MCG tablet Take 1,000 mcg by mouth every morning.     . bisoprolol (ZEBETA) 10 MG tablet Take 1 tablet (10 mg total)  by mouth daily. 30 tablet 3  . cholecalciferol (VITAMIN D) 1000 UNITS tablet Take 1,000 Units by mouth every morning.     . cloNIDine (CATAPRES) 0.2 MG tablet Take 1 tablet (0.2 mg total) by mouth 3 (three) times daily. 90 tablet 99  . furosemide (LASIX) 40 MG tablet Take 2 tablets (80 mg total) by mouth daily. 120 tablet 11  . hydrALAZINE (APRESOLINE) 100 MG tablet Take 1 tablet (100 mg total) by mouth 3 (three) times daily. 90 tablet 3  . Multiple Vitamin (MULTIVITAMIN WITH MINERALS) TABS Take 1 tablet by mouth every morning.     . pantoprazole (PROTONIX) 40 MG tablet Take 1 tablet (40 mg total) by mouth 2 (two) times daily before a meal. 60 tablet 3  . polyvinyl alcohol (LIQUIFILM TEARS) 1.4 % ophthalmic solution Place 1-2 drops into both eyes as needed for dry eyes.    . potassium chloride SA (K-DUR,KLOR-CON) 20 MEQ tablet Take 20 mEq by mouth every other day, alternating with 40 mEq every other day (divided doses 20 mEq in AM and 20 mEq in PM). 135 tablet 3  . Propylene Glycol (SYSTANE BALANCE OP) Apply 1-2 drops to eye 3 (three) times daily.    . sodium chloride (MURO 128) 5 % ophthalmic solution Place 1 drop into both eyes at bedtime.    Marland Kitchen spironolactone (ALDACTONE) 25 MG tablet Take 1 tablet (25 mg total) by mouth daily. [place]  tablet 3   No current facility-administered medications on file prior to visit.     Past Medical History  Diagnosis Date  . Vitamin D deficiency   . Diverticulosis   . Coronary artery calcification seen on CAT scan   . TIA (transient ischemic attack)     Carotid dopplers (8/11): 40-50% RICA stenosis. Carotid dopplers (12/13) with 40-59% bilateral ICA stenoses.   . CKD (chronic kidney disease)   . Inguinal hernia   . Broken hip     bilateral  . Hypertension   . Hyperlipidemia   . Hypothyroidism   . Senile osteoporosis   . Osteopenia   . Elevated hemoglobin A1c   . Bradycardia     Holter monitor (8/11) with no significant bradycardia, frequent PACs, few  short runs of slow atrial tachycardia.   . Chronic diastolic heart failure     a. Echo (6/15):  EF 60-65%, Gr 2 DD, mild AS (mean 8 mmHg);  b.  Echo (11/2013):  mild LVH, EF 55-60%, no RWMA, Gr 1 DD, mild AS (peak 19 mmHg), mild MR, trivial eff   . Aortic stenosis     a. mild (echo 6/15 with mean gradient 8 mmHg)  . Carotid stenosis     a. Carotid US (04/2012):  bilateral ICA 40-59%;  b.  Carotid US (8/15): Bilateral ICA 40-59% - follow up 1 year      Past Surgical History  Procedure Laterality Date  . Inguinal hernia repair    . Tonsillectomy    . Hip arthroplasty  04/01/2012    Procedure: ARTHROPLASTY BIPOLAR HIP;  Surgeon: Kerin Salen, MD;  Location: [facility];  Service: Orthopedics;  Laterality: Left;  . Compression hip screw  05/06/2012    Procedure: COMPRESSION HIP;  Surgeon: Jolyn Nap, MD;  Location: [facility];  Service: Orthopedics;  Laterality: Right;  . Skin lesion excision      benignt; from shoulder  . Hip surgery      left  . Eye surgery      bilateral  . Eye surgery      tear duct R side  . Blepharoplasty  1983  . Melanoma excision  1991    Right Arm  . Total hip arthroplasty Right 2013  . Total hip arthroplasty Left 2013  . Esophagogastroduodenoscopy N/A 11/12/2013    Procedure: ESOPHAGOGASTRODUODENOSCOPY (EGD);  Surgeon: Jerene Bears, MD;  Location: [facility];  Service: Endoscopy;  Laterality: N/A;     Family History  Problem Relation Age of Onset  . CVA Mother     died age 56  . Emphysema Sister   . Colon cancer Neg Hx   . Other      patient does not know     History   Social History  . Marital Status: Widowed    Spouse Name: N/A    Number of Children: 0  . Years of Education: N/A   Occupational History  . Not on file.   Social History Main Topics  . Smoking status: Former Smoker    Types: Cigarettes    Quit date: 05/06/1971  . Smokeless tobacco: Never Used  . Alcohol Use: No     Comment: none since Nov 2013-was on 2 beers/night  .  Drug Use: No  . Sexual Activity: Not Currently   Other Topics Concern  . Not on file   Social History Narrative     ROS A 10 point review of system was performed. It is negative  other than that mentioned in the history of present illness.   PHYSICAL EXAM   BP 182/60 mmHg  Pulse 78  Ht 5' 8.5" (1.74 m)  Wt 121 lb (54.885 kg)  BMI 18.13 kg/m2  SpO2 98% Constitutional: She is oriented to person, place, and time. She appears frail. No distress.  HENT: No nasal discharge.  Head: Normocephalic and atraumatic.  Eyes: Pupils are equal and round. No discharge.  Neck: Normal range of motion. Neck supple. No JVD present. No thyromegaly present.  Cardiovascular: Normal rate, regular rhythm, normal heart sounds. Exam reveals no gallop and no friction rub. No murmur heard.  Pulmonary/Chest: Effort normal and breath sounds normal. No stridor. No respiratory distress. She has no wheezes. She has no rales. She exhibits no tenderness.  Abdominal: Soft. Bowel sounds are normal. She exhibits no distension. There is no tenderness. There is no rebound and no guarding.  Musculoskeletal: Normal range of motion. She exhibits no edema and no tenderness.  Neurological: She is alert and oriented to person, place, and time. Coordination normal.  Skin: Skin is warm and dry. No rash noted. She is not diaphoretic. No erythema. No pallor.  Psychiatric: She has a normal mood and affect. Her behavior is normal. Judgment and thought content normal.  Vascular: Femoral pulses are very strong.      ASSESSMENT AND PLAN

## 2014-07-02 NOTE — Assessment & Plan Note (Signed)
She has significant disease affecting the distal aorta. However, femoral pulses are very strong and she denies any claudication. Thus, no indication for revascularization.

## 2014-07-04 ENCOUNTER — Ambulatory Visit: Payer: Self-pay | Admitting: Internal Medicine

## 2014-07-04 ENCOUNTER — Ambulatory Visit (INDEPENDENT_AMBULATORY_CARE_PROVIDER_SITE_OTHER): Payer: Medicare Other | Admitting: Internal Medicine

## 2014-07-04 ENCOUNTER — Encounter: Payer: Self-pay | Admitting: Internal Medicine

## 2014-07-04 VITALS — BP 200/64 | HR 60 | Temp 98.1°F | Resp 16 | Ht 68.5 in | Wt 120.8 lb

## 2014-07-04 DIAGNOSIS — R7309 Other abnormal glucose: Secondary | ICD-10-CM

## 2014-07-04 DIAGNOSIS — E785 Hyperlipidemia, unspecified: Secondary | ICD-10-CM

## 2014-07-04 DIAGNOSIS — Z79899 Other long term (current) drug therapy: Secondary | ICD-10-CM

## 2014-07-04 DIAGNOSIS — E559 Vitamin D deficiency, unspecified: Secondary | ICD-10-CM

## 2014-07-04 DIAGNOSIS — E039 Hypothyroidism, unspecified: Secondary | ICD-10-CM

## 2014-07-04 DIAGNOSIS — I150 Renovascular hypertension: Secondary | ICD-10-CM

## 2014-07-04 LAB — CBC WITH DIFFERENTIAL/PLATELET
BASOS ABS: 0.1 10*3/uL (ref 0.0–0.1)
BASOS PCT: 1 % (ref 0–1)
EOS PCT: 1 % (ref 0–5)
Eosinophils Absolute: 0.1 10*3/uL (ref 0.0–0.7)
HEMATOCRIT: 36.4 % (ref 36.0–46.0)
HEMOGLOBIN: 11.7 g/dL — AB (ref 12.0–15.0)
LYMPHS ABS: 1.2 10*3/uL (ref 0.7–4.0)
LYMPHS PCT: 15 % (ref 12–46)
MCH: 28.9 pg (ref 26.0–34.0)
MCHC: 32.1 g/dL (ref 30.0–36.0)
MCV: 89.9 fL (ref 78.0–100.0)
MONO ABS: 0.6 10*3/uL (ref 0.1–1.0)
MONOS PCT: 7 % (ref 3–12)
MPV: 11.1 fL (ref 8.6–12.4)
Neutro Abs: 6.2 10*3/uL (ref 1.7–7.7)
Neutrophils Relative %: 76 % (ref 43–77)
Platelets: 295 10*3/uL (ref 150–400)
RBC: 4.05 MIL/uL (ref 3.87–5.11)
RDW: 14.2 % (ref 11.5–15.5)
WBC: 8.2 10*3/uL (ref 4.0–10.5)

## 2014-07-04 MED ORDER — MINOXIDIL 2.5 MG PO TABS: ORAL_TABLET | ORAL | Status: DC

## 2014-07-04 NOTE — Patient Instructions (Signed)

## 2014-07-04 NOTE — Progress Notes (Signed)
Subjective:    Patient ID: Rachael [name], female    DOB: January 30, 1924, 81 y.o.   MRN: [id number]  HPI Very nice 81 yo WWF with long-standing HTN, ASCVD/hx/oTIA's, chDiaCHF and recently very labile Accelerated HTN found by Dr Aundra Dubin to have critical Rt RAS and had Vascular eval by Dr Fletcher Anon. Amlodipine was added to pt's regimenand she presents today with persistently elevated BP and surprisingly has no real sx's of her severe HTN. Cardiac systems review is completely negative. Patient has been followed in this office for over 30 years for her previously stable HTN, HLD, PreDM and chronic venous insufficiency and is noted to have an acceleration of her CKD with worsening of her BP control.  Today she is c/o severe dry mouth and sleepiness - presumed related to her Clonidine Rx. Patient is due for her quarterly lab monitoring.  Medication Sig  . allopurinol (ZYLOPRIM) 100 MG tablet Take 1 tablet (100 mg total) by mouth every morning.  Marland Kitchen amLODipine (NORVASC) 5 MG tablet Take 1 tablet (5 mg total) by mouth daily.  Marland Kitchen aspirin EC 81 MG tablet Take 1 tablet (81 mg total) by mouth daily.  Marland Kitchen atorvastatin (LIPITOR) 20 MG tablet Take 1 tablet (20 mg total) by mouth daily.  . Biotin 1000 MCG tablet Take 1,000 mcg by mouth every morning.   . bisoprolol (ZEBETA) 10 MG tablet Take 1 tablet (10 mg total) by mouth daily.  . cholecalciferol (VITAMIN D) 1000 UNITS tablet Take 1,000 Units by mouth every morning.   . cloNIDine (CATAPRES) 0.2 MG tablet Take 1 tablet (0.2 mg total) by mouth 3 (three) times daily.  . furosemide (LASIX) 40 MG tablet Take 2 tablets (80 mg total) by mouth daily.  . hydrALAZINE (APRESOLINE) 100 MG tablet Take 1 tablet (100 mg total) by mouth 3 (three) times daily.  Marland Kitchen levothyroxine (SYNTHROID, LEVOTHROID) 175 MCG tablet Take 175 mcg by mouth daily before breakfast. Pt taking one half tablet on Sun, Tue. Thur.,and Sat.  . Multiple Vitamin (MULTIVITAMIN WITH MINERALS) TABS Take 1 tablet by  mouth every morning.   . pantoprazole (PROTONIX) 40 MG tablet Take 1 tablet (40 mg total) by mouth 2 (two) times daily before a meal.  . polyvinyl alcohol (LIQUIFILM TEARS) 1.4 % ophthalmic solution Place 1-2 drops into both eyes as needed for dry eyes.  . potassium chloride SA (K-DUR,KLOR-CON) 20 MEQ tablet Take 1 tablet (20 mEq total) by mouth daily.  Marland Kitchen Propylene Glycol (SYSTANE BALANCE OP) Apply 1-2 drops to eye 3 (three) times daily.  . sodium chloride (MURO 128) 5 % ophthalmic solution Place 1 drop into both eyes at bedtime.  Marland Kitchen spironolactone (ALDACTONE) 25 MG tablet Take 1 tablet (25 mg total) by mouth daily.   Allergies  Allergen Reactions  . Aricept [Donepezil Hcl] Nausea And Vomiting    Nausea and vomiting  . Prevacid [Lansoprazole] Nausea And Vomiting    GI upset  . Tylenol [Acetaminophen] Nausea Only    Tylenol #3 - Nausea   Past Medical History  Diagnosis Date  . Vitamin D deficiency   . Diverticulosis   . Coronary artery calcification seen on CAT scan   . TIA (transient ischemic attack)     Carotid dopplers (8/11): 40-50% RICA stenosis. Carotid dopplers (12/13) with 40-59% bilateral ICA stenoses.   . CKD (chronic kidney disease)   . Inguinal hernia   . Broken hip     bilateral  . Hypertension   . Hyperlipidemia   . Hypothyroidism   .  Senile osteoporosis   . Osteopenia   . Elevated hemoglobin A1c   . Bradycardia     Holter monitor (8/11) with no significant bradycardia, frequent PACs, few short runs of slow atrial tachycardia.   . Chronic diastolic heart failure     a. Echo (6/15):  EF 60-65%, Gr 2 DD, mild AS (mean 8 mmHg);  b.  Echo (11/2013):  mild LVH, EF 55-60%, no RWMA, Gr 1 DD, mild AS (peak 19 mmHg), mild MR, trivial eff   . Aortic stenosis     a. mild (echo 6/15 with mean gradient 8 mmHg)  . Carotid stenosis     a. Carotid US (04/2012):  bilateral ICA 40-59%;  b.  Carotid US (8/15): Bilateral ICA 40-59% - follow up 1 year    Past Surgical History   Procedure Laterality Date  . Inguinal hernia repair    . Tonsillectomy    . Hip arthroplasty  04/01/2012    Procedure: ARTHROPLASTY BIPOLAR HIP;  Surgeon: Kerin Salen, MD;  Location: [facility];  Service: Orthopedics;  Laterality: Left;  . Compression hip screw  05/06/2012    Procedure: COMPRESSION HIP;  Surgeon: Jolyn Nap, MD;  Location: [facility];  Service: Orthopedics;  Laterality: Right;  . Skin lesion excision      benignt; from shoulder  . Hip surgery      left  . Eye surgery      bilateral  . Eye surgery      tear duct R side  . Blepharoplasty  1983  . Melanoma excision  1991    Right Arm  . Total hip arthroplasty Right 2013  . Total hip arthroplasty Left 2013  . Esophagogastroduodenoscopy N/A 11/12/2013    Procedure: ESOPHAGOGASTRODUODENOSCOPY (EGD);  Surgeon: Jerene Bears, MD;  Location: [facility];  Service: Endoscopy;  Laterality: N/A;   Review of Systems 10 pt systems review neg except as above.    Objective:   Physical Exam          BP 200/64 rechecked x 2 Pulse 60  Temp 98.1 F   Resp 16  Ht 5' 8.5"   Wt 120 lb 12.8 oz     BMI 18.10   HEENT - Eac's patent. TM's Nl. EOM's full. PERRLA. NasoOroPharynx clear. Neck - supple. Nl Thyroid. Carotids 2+ & No bruits, nodes, JVD Chest - Clear equal BS w/o Rales, rhonchi, wheezes. Cor - Nl HS. RRR w/o sig MGR. PP 1(+).1-2 (+) ankle edema. Abd - No palpable organomegaly, masses or tenderness. BS nl. MS- FROM w/o deformities. Muscle power, tone and bulk Nl. Gait Nl. Neuro - No obvious Cr N abnormalities. Sensory, motor and Cerebellar functions appear Nl w/o focal abnormalities. Psyche - Mental status normal & appropriate.  No delusions, ideations or obvious mood abnormalities.    Assessment & Plan:   1. Renovascular hypertension  - minoxidil (LONITEN) 2.5 MG tablet; Take 1 to 2 tablets daily for BP as directed  Dispense: 60 tablet; Refill: 1 - to start with 1 tablet & ROV 1 week for reassessment. Hopefully will be  able to taper or d/c the Clonidine which she's not tolerating well.  2. Hyperlipidemia  - Lipid panel  3. PreDiabetes  - Hemoglobin A1c - Insulin, fasting  4. Vitamin D deficiency  - Vit D  25 hydroxy (rtn osteoporosis monitoring)  5. Medication management  - BASIC METABOLIC PANEL WITH GFR - Hepatic function panel - Magnesium - CBC with Differential/Platelet  6. Hypothyroidism, unspecified hypothyroidism  type  - TSH

## 2014-07-05 LAB — HEMOGLOBIN A1C
Hgb A1c MFr Bld: 5.6 % (ref <5.7)
Mean Plasma Glucose: 114 mg/dL (ref <117)

## 2014-07-05 LAB — TSH: TSH: 4.099 u[IU]/mL (ref 0.350–4.500)

## 2014-07-05 LAB — LIPID PANEL
Cholesterol: 180 mg/dL (ref 0–200)
HDL: 49 mg/dL (ref >39)
LDL Cholesterol: 114 mg/dL — ABNORMAL HIGH (ref 0–99)
Total CHOL/HDL Ratio: 3.7 Ratio
Triglycerides: 84 mg/dL (ref <150)
VLDL: 17 mg/dL (ref 0–40)

## 2014-07-05 LAB — HEPATIC FUNCTION PANEL
ALT: 17 U/L (ref 0–35)
AST: 23 U/L (ref 0–37)
Albumin: 4.3 g/dL (ref 3.5–5.2)
Alkaline Phosphatase: 75 U/L (ref 39–117)
Bilirubin, Direct: 0.1 mg/dL (ref 0.0–0.3)
Indirect Bilirubin: 0.5 mg/dL (ref 0.2–1.2)
TOTAL PROTEIN: 6.5 g/dL (ref 6.0–8.3)
Total Bilirubin: 0.6 mg/dL (ref 0.2–1.2)

## 2014-07-05 LAB — BASIC METABOLIC PANEL WITH GFR
BUN: 59 mg/dL — ABNORMAL HIGH (ref 6–23)
CALCIUM: 10.1 mg/dL (ref 8.4–10.5)
CHLORIDE: 96 meq/L (ref 96–112)
CO2: 30 meq/L (ref 19–32)
Creat: 2.01 mg/dL — ABNORMAL HIGH (ref 0.50–1.10)
GFR, Est African American: 25 mL/min — ABNORMAL LOW
GFR, Est Non African American: 22 mL/min — ABNORMAL LOW
GLUCOSE: 94 mg/dL (ref 70–99)
Potassium: 4.6 mEq/L (ref 3.5–5.3)
Sodium: 138 mEq/L (ref 135–145)

## 2014-07-05 LAB — INSULIN, FASTING: Insulin fasting, serum: 16.2 u[IU]/mL (ref 2.0–19.6)

## 2014-07-05 LAB — VITAMIN D 25 HYDROXY (VIT D DEFICIENCY, FRACTURES): VIT D 25 HYDROXY: 73 ng/mL (ref 30–100)

## 2014-07-05 LAB — MAGNESIUM: Magnesium: 2.7 mg/dL — ABNORMAL HIGH (ref 1.5–2.5)

## 2014-07-07 ENCOUNTER — Encounter: Payer: Self-pay | Admitting: Internal Medicine

## 2014-07-09 ENCOUNTER — Ambulatory Visit: Payer: Self-pay | Admitting: Internal Medicine

## 2014-07-10 ENCOUNTER — Other Ambulatory Visit: Payer: Self-pay | Admitting: *Deleted

## 2014-07-10 DIAGNOSIS — E785 Hyperlipidemia, unspecified: Secondary | ICD-10-CM

## 2014-07-10 MED ORDER — ATORVASTATIN CALCIUM 20 MG PO TABS: 20.0000 mg | ORAL_TABLET | Freq: Every day | ORAL | Status: DC

## 2014-07-11 ENCOUNTER — Encounter: Payer: Self-pay | Admitting: Internal Medicine

## 2014-07-11 ENCOUNTER — Ambulatory Visit (INDEPENDENT_AMBULATORY_CARE_PROVIDER_SITE_OTHER): Payer: Medicare Other | Admitting: Internal Medicine

## 2014-07-11 ENCOUNTER — Ambulatory Visit: Payer: Self-pay | Admitting: Internal Medicine

## 2014-07-11 VITALS — BP 178/60 | HR 60 | Temp 98.1°F | Resp 16 | Ht 68.5 in | Wt 123.2 lb

## 2014-07-11 DIAGNOSIS — I150 Renovascular hypertension: Secondary | ICD-10-CM

## 2014-07-11 DIAGNOSIS — I701 Atherosclerosis of renal artery: Secondary | ICD-10-CM

## 2014-07-11 NOTE — Patient Instructions (Signed)
  Cut Clonidine 0.2 mg tabs to 1/2 tablet  = 0.1 mg   3 x daily  Increase Minoxidil 2.5 mg to 3 tablets  = 7.5 mg daily

## 2014-07-14 ENCOUNTER — Encounter: Payer: Self-pay | Admitting: Internal Medicine

## 2014-07-14 NOTE — Progress Notes (Signed)
Subjective:    Patient ID: Rachael [name], female    DOB: 1924/01/26, 81 y.o.   MRN: [id number]  HPI   This very nice 81 yo WWF 70 year veteran of this practice followed for her HTN, hx/o TIA's, Elevated Cholesterol recently presented with accelerated HTN and discovered severe unilateral RAS by Dr Aundra Dubin returns for 1 week f/u after starting low dose Minoxidil for mgmt of her polypharmacy anti-HT regimen complicated by lethargy on clonidine. Today's BP remains high with persistent lethargy/dry mouth still felt most likely due to her Clonidine. Denies any sx's/o HA, dizziness, CP or palpitations. Medication Sig  . allopurinol (ZYLOPRIM) 100 MG tablet Take 1 tablet (100 mg total) by mouth every morning.  Marland Kitchen amLODipine (NORVASC) 5 MG tablet Take 1 tablet (5 mg total) by mouth daily.  Marland Kitchen aspirin EC 81 MG tablet Take 1 tablet (81 mg total) by mouth daily.  Marland Kitchen atorvastatin (LIPITOR) 20 MG tablet Take 1 tablet (20 mg total) by mouth daily.  . Biotin 1000 MCG tablet Take 1,000 mcg by mouth every morning.   . bisoprolol (ZEBETA) 10 MG tablet Take 1 tablet (10 mg total) by mouth daily.  . cholecalciferol (VITAMIN D) 1000 UNITS tablet Take 1,000 Units by mouth every morning.   . cloNIDine (CATAPRES) 0.2 MG tablet Take 1 tablet (0.2 mg total) by mouth 3 (three) times daily.  . furosemide (LASIX) 40 MG tablet Take 2 tablets (80 mg total) by mouth daily.  . hydrALAZINE (APRESOLINE) 100 MG tablet Take 1 tablet (100 mg total) by mouth 3 (three) times daily.  Marland Kitchen levothyroxine (SYNTHROID, LEVOTHROID) 175 MCG tablet Take 175 mcg by mouth daily before breakfast. Pt taking one half tablet on Sun, Tue. Thur.,and Sat.  . minoxidil (LONITEN) 2.5 MG tablet Take 1 to 2 tablets daily for BP as directed  . Multiple Vitamin (MULTIVITAMIN WITH MINERALS) TABS Take 1 tablet by mouth every morning.   . pantoprazole (PROTONIX) 40 MG tablet Take 1 tablet (40 mg total) by mouth 2 (two) times daily before a meal.  . polyvinyl alcohol  (LIQUIFILM TEARS) 1.4 % ophthalmic solution Place 1-2 drops into both eyes as needed for dry eyes.  . potassium chloride SA (K-DUR,KLOR-CON) 20 MEQ tablet Take 1 tablet (20 mEq total) by mouth daily. (Patient taking differently: Take 20 mEq by mouth. Taking 20 meq alternating with 40 meq every other day.)  . Propylene Glycol (SYSTANE BALANCE OP) Apply 1-2 drops to eye 3 (three) times daily.  . sodium chloride (MURO 128) 5 % ophthalmic solution Place 1 drop into both eyes at bedtime.  Marland Kitchen spironolactone (ALDACTONE) 25 MG tablet Take 1 tablet (25 mg total) by mouth daily.   Allergies  Allergen Reactions  . Aricept [Donepezil Hcl] Nausea And Vomiting    Nausea and vomiting  . Prevacid [Lansoprazole] Nausea And Vomiting    GI upset  . Tylenol [Acetaminophen] Nausea Only    Tylenol #3 - Nausea   Past Medical History  Diagnosis Date  . Vitamin D deficiency   . Diverticulosis   . Coronary artery calcification seen on CAT scan   . TIA (transient ischemic attack)     Carotid dopplers (8/11): 40-50% RICA stenosis. Carotid dopplers (12/13) with 40-59% bilateral ICA stenoses.   . CKD (chronic kidney disease)   . Inguinal hernia   . Broken hip     bilateral  . Hypertension   . Hyperlipidemia   . Hypothyroidism   . Senile osteoporosis   . Osteopenia   .  Elevated hemoglobin A1c   . Bradycardia     Holter monitor (8/11) with no significant bradycardia, frequent PACs, few short runs of slow atrial tachycardia.   . Chronic diastolic heart failure     a. Echo (6/15):  EF 60-65%, Gr 2 DD, mild AS (mean 8 mmHg);  b.  Echo (11/2013):  mild LVH, EF 55-60%, no RWMA, Gr 1 DD, mild AS (peak 19 mmHg), mild MR, trivial eff   . Aortic stenosis     a. mild (echo 6/15 with mean gradient 8 mmHg)  . Carotid stenosis     a. Carotid US (04/2012):  bilateral ICA 40-59%;  b.  Carotid US (8/15): Bilateral ICA 40-59% - follow up 1 year   Review of Systems 10 pt systems review negative to above.    Objective:    Physical Exam BP 178/60 confirmed bilat.  P 60  T 98.1 F   R 16  Ht 5' 8.5"   Wt 123 lb 3   BMI 18.46   HEENT - Eac's patent. TM's Nl. EOM's full. PERRLA. NasoOroPharynx clear. Neck - supple. Nl Thyroid. Carotids 2+ & No bruits, nodes, JVD Chest - Clear equal BS w/o Rales, rhonchi, wheezes. Cor - Nl HS. RRR w/o sig MGR. PP 1(+). No edema. Abd - No palpable organomegaly, masses or tenderness. BS nl. MS- FROM w/o deformities. Muscle power, tone and bulk Nl. Gait Nl. Neuro - No obvious Cr N abnormalities. Sensory, motor and Cerebellar functions appear Nl w/o focal abnormalities. Psyche - Mental status normal & appropriate.     Assessment & Plan:   1. Renovascular hypertension  - Recc cut Clonidine 0.2 mg to 1/2 tab = 0.1 mg tid And increase her Minoxidil 2.5 mg to 3 tab = 7.5 mg qd and return in 1 week for ongoing BP surveillance med adjustment.

## 2014-07-15 ENCOUNTER — Ambulatory Visit (INDEPENDENT_AMBULATORY_CARE_PROVIDER_SITE_OTHER): Payer: Medicare Other | Admitting: Internal Medicine

## 2014-07-15 ENCOUNTER — Encounter: Payer: Self-pay | Admitting: Internal Medicine

## 2014-07-15 VITALS — BP 176/60 | HR 64 | Temp 98.2°F | Resp 16 | Ht 68.5 in | Wt 122.6 lb

## 2014-07-15 DIAGNOSIS — R0600 Dyspnea, unspecified: Secondary | ICD-10-CM

## 2014-07-15 DIAGNOSIS — I701 Atherosclerosis of renal artery: Secondary | ICD-10-CM

## 2014-07-15 DIAGNOSIS — I150 Renovascular hypertension: Secondary | ICD-10-CM

## 2014-07-15 NOTE — Progress Notes (Deleted)
Patient ID: Rachael [name], female   DOB: 1924/01/27, 81 y.o.   MRN: [id number]

## 2014-07-15 NOTE — Progress Notes (Signed)
Subjective:    Patient ID: Rachael [name], female    DOB: 20-Jun-1923, 81 y.o.   MRN: [id number]  HPI 81 yo WWF with recent eval/mgmt of accelerated BP's presents today very anxious & tearful after 2 episoge of SOB which she actually describes a a sigh type deep breath. - denies any DOE , CP - exertional or otherwise and no assoc palpitations. Pursuant to last OV, she did cut her Clonidine in 1/2 with almost immediate improvement in her  somnolence and dry mouth , but unfortunately did not understand her written instructions to increase her minoxidil dose , so it's no surprise that her BP remains quite elevated.  Medication Sig  . allopurinol (ZYLOPRIM) 100 MG tablet Take 1 tablet (100 mg total) by mouth every morning.  Marland Kitchen amLODipine (NORVASC) 5 MG tablet Take 1 tablet (5 mg total) by mouth daily.  Marland Kitchen aspirin EC 81 MG tablet Take 1 tablet (81 mg total) by mouth daily.  Marland Kitchen atorvastatin (LIPITOR) 20 MG tablet Take 1 tablet (20 mg total) by mouth daily.  . Biotin 1000 MCG tablet Take 1,000 mcg by mouth every morning.   . bisoprolol (ZEBETA) 10 MG tablet Take 1 tablet (10 mg total) by mouth daily.  . cholecalciferol (VITAMIN D) 1000 UNITS tablet Take 1,000 Units by mouth every morning.   . cloNIDine (CATAPRES) 0.2 MG tablet Take 1 tablet (0.2 mg total) by mouth 3 (three) times daily.  . furosemide (LASIX) 40 MG tablet Take 2 tablets (80 mg total) by mouth daily.  . hydrALAZINE (APRESOLINE) 100 MG tablet Take 1 tablet (100 mg total) by mouth 3 (three) times daily.  Marland Kitchen levothyroxine (SYNTHROID, LEVOTHROID) 175 MCG tablet Take 175 mcg by mouth daily before breakfast. Pt taking one half tablet on Sun, Tue. Thur.,and Sat.  . minoxidil (LONITEN) 2.5 MG tablet Take 1 to 2 tablets daily for BP as directed  . Multiple Vitamin (MULTIVITAMIN WITH MINERALS) TABS Take 1 tablet by mouth every morning.   . pantoprazole (PROTONIX) 40 MG tablet Take 1 tablet (40 mg total) by mouth 2 (two) times daily before a meal.   . polyvinyl alcohol (LIQUIFILM TEARS) 1.4 % ophthalmic solution Place 1-2 drops into both eyes as needed for dry eyes.  . potassium chloride SA (K-DUR,KLOR-CON) 20 MEQ tablet Take 1 tablet (20 mEq total) by mouth daily. (Patient taking differently: Take 20 mEq by mouth. Taking 20 meq alternating with 40 meq every other day.)  . Propylene Glycol (SYSTANE BALANCE OP) Apply 1-2 drops to eye 3 (three) times daily.  . sodium chloride (MURO 128) 5 % ophthalmic solution Place 1 drop into both eyes at bedtime.  Marland Kitchen spironolactone (ALDACTONE) 25 MG tablet Take 1 tablet (25 mg total) by mouth daily.   Allergies  Allergen Reactions  . Aricept [Donepezil Hcl] Nausea And Vomiting    Nausea and vomiting  . Prevacid [Lansoprazole] Nausea And Vomiting    GI upset  . Tylenol [Acetaminophen] Nausea Only    Tylenol #3 - Nausea   Past Medical History  Diagnosis Date  . Vitamin D deficiency   . Diverticulosis   . Coronary artery calcification seen on CAT scan   . TIA (transient ischemic attack)     Carotid dopplers (8/11): 40-50% RICA stenosis. Carotid dopplers (12/13) with 40-59% bilateral ICA stenoses.   . CKD (chronic kidney disease)   . Inguinal hernia   . Broken hip     bilateral  . Hypertension   . Hyperlipidemia   .  Hypothyroidism   . Senile osteoporosis   . Osteopenia   . Elevated hemoglobin A1c   . Bradycardia     Holter monitor (8/11) with no significant bradycardia, frequent PACs, few short runs of slow atrial tachycardia.   . Chronic diastolic heart failure     a. Echo (6/15):  EF 60-65%, Gr 2 DD, mild AS (mean 8 mmHg);  b.  Echo (11/2013):  mild LVH, EF 55-60%, no RWMA, Gr 1 DD, mild AS (peak 19 mmHg), mild MR, trivial eff   . Aortic stenosis     a. mild (echo 6/15 with mean gradient 8 mmHg)  . Carotid stenosis     a. Carotid US (04/2012):  bilateral ICA 40-59%;  b.  Carotid US (8/15): Bilateral ICA 40-59% - follow up 1 year    Past Surgical History  Procedure Laterality Date  .  Inguinal hernia repair    . Tonsillectomy    . Hip arthroplasty  04/01/2012    Procedure: ARTHROPLASTY BIPOLAR HIP;  Surgeon: Kerin Salen, MD;  Location: [facility];  Service: Orthopedics;  Laterality: Left;  . Compression hip screw  05/06/2012    Procedure: COMPRESSION HIP;  Surgeon: Jolyn Nap, MD;  Location: [place];  Service: Orthopedics;  Laterality: Right;  . Skin lesion excision      benignt; from shoulder  . Hip surgery      left  . Eye surgery      bilateral  . Eye surgery      tear duct R side  . Blepharoplasty  1983  . Melanoma excision  1991    Right Arm  . Total hip arthroplasty Right 2013  . Total hip arthroplasty Left 2013  . Esophagogastroduodenoscopy N/A 11/12/2013    Procedure: ESOPHAGOGASTRODUODENOSCOPY (EGD);  Surgeon: Jerene Bears, MD;  Location: [facility];  Service: Endoscopy;  Laterality: N/A;   Review of Systems In addition to the HPI above,  No Fever-chills,  No Headache, No changes with Vision or hearing,  No problems swallowing food or Liquids,  No Chest pain or productive Cough,  No Abdominal pain, No Nausea or Vomitting, Bowel movements are regular,  No new skin rashes or bruises,  No new joints pains-aches,  No new weakness, tingling, numbness in any extremity,  No recent weight loss,  No polyuria, polydypsia or polyphagia,  No significant Mental Stressors.  A full 10 point Review of Systems was done, except as stated above, all other Review of Systems were negative    Objective:   Physical Exam BP 176/60 Pulse 64  Temp 98.2 F  Resp 16  Ht 5' 8.5"  Wt 122 lb 9.6 oz   O2 sat = 94-95%  BMI 18.37   HEENT - Eac's patent. TM's Nl. EOM's full. PERRLA. NasoOroPharynx clear. Neck - supple. Nl Thyroid. Carotids 2+ & No bruits, nodes, JVD Chest - Clear equal BS w/o Rales, rhonchi, wheezes. Cor - Nl HS. RRR w/o sig MGR. PP 1(+).  Trace 1(+) edema. Abd - No palpable organomegaly, masses or tenderness. BS nl. MS- FROM w/o deformities. Muscle  power, tone and bulk Nl. Gait Nl. Neuro - No obvious Cr N abnormalities. Sensory, motor and Cerebellar functions appear Nl w/o focal abnormalities. Psyche - Mental status normal & appropriate.  No delusions, ideations or obvious mood abnormalities.    Assessment & Plan:   1. Renovascular hypertension  - encourage to increase minoxidil to 7.5 mg w/written instructions  2. Dyspnea - psychogenic

## 2014-07-15 NOTE — Patient Instructions (Signed)
   Increase Minoxidil 2.5 MG to 3 tablets = 7.5 mg daily

## 2014-07-17 ENCOUNTER — Encounter (HOSPITAL_COMMUNITY): Payer: Medicare Other

## 2014-07-17 ENCOUNTER — Inpatient Hospital Stay (HOSPITAL_COMMUNITY)
Admission: EM | Admit: 2014-07-17 | Discharge: 2014-07-29 | DRG: 291 | Disposition: A | Discharge status: Hospice | Payer: Medicare Other | Attending: Internal Medicine | Admitting: Internal Medicine

## 2014-07-17 ENCOUNTER — Encounter: Payer: Self-pay | Admitting: Internal Medicine

## 2014-07-17 ENCOUNTER — Encounter (HOSPITAL_COMMUNITY): Payer: Self-pay | Admitting: Emergency Medicine

## 2014-07-17 ENCOUNTER — Emergency Department (HOSPITAL_COMMUNITY): Payer: Medicare Other

## 2014-07-17 DIAGNOSIS — I701 Atherosclerosis of renal artery: Secondary | ICD-10-CM | POA: Diagnosis present

## 2014-07-17 DIAGNOSIS — Z7982 Long term (current) use of aspirin: Secondary | ICD-10-CM

## 2014-07-17 DIAGNOSIS — R109 Unspecified abdominal pain: Secondary | ICD-10-CM

## 2014-07-17 DIAGNOSIS — R112 Nausea with vomiting, unspecified: Secondary | ICD-10-CM

## 2014-07-17 DIAGNOSIS — Z87891 Personal history of nicotine dependence: Secondary | ICD-10-CM

## 2014-07-17 DIAGNOSIS — N184 Chronic kidney disease, stage 4 (severe): Secondary | ICD-10-CM | POA: Diagnosis present

## 2014-07-17 DIAGNOSIS — E559 Vitamin D deficiency, unspecified: Secondary | ICD-10-CM | POA: Diagnosis present

## 2014-07-17 DIAGNOSIS — R0600 Dyspnea, unspecified: Secondary | ICD-10-CM

## 2014-07-17 DIAGNOSIS — Z8673 Personal history of transient ischemic attack (TIA), and cerebral infarction without residual deficits: Secondary | ICD-10-CM

## 2014-07-17 DIAGNOSIS — E860 Dehydration: Secondary | ICD-10-CM | POA: Diagnosis present

## 2014-07-17 DIAGNOSIS — E785 Hyperlipidemia, unspecified: Secondary | ICD-10-CM | POA: Diagnosis present

## 2014-07-17 DIAGNOSIS — I6529 Occlusion and stenosis of unspecified carotid artery: Secondary | ICD-10-CM | POA: Diagnosis present

## 2014-07-17 DIAGNOSIS — R110 Nausea: Secondary | ICD-10-CM | POA: Diagnosis not present

## 2014-07-17 DIAGNOSIS — I493 Ventricular premature depolarization: Secondary | ICD-10-CM | POA: Diagnosis not present

## 2014-07-17 DIAGNOSIS — K5900 Constipation, unspecified: Secondary | ICD-10-CM | POA: Insufficient documentation

## 2014-07-17 DIAGNOSIS — J9600 Acute respiratory failure, unspecified whether with hypoxia or hypercapnia: Secondary | ICD-10-CM | POA: Diagnosis not present

## 2014-07-17 DIAGNOSIS — I129 Hypertensive chronic kidney disease with stage 1 through stage 4 chronic kidney disease, or unspecified chronic kidney disease: Secondary | ICD-10-CM | POA: Diagnosis present

## 2014-07-17 DIAGNOSIS — I5033 Acute on chronic diastolic (congestive) heart failure: Secondary | ICD-10-CM | POA: Diagnosis present

## 2014-07-17 DIAGNOSIS — D638 Anemia in other chronic diseases classified elsewhere: Secondary | ICD-10-CM | POA: Diagnosis present

## 2014-07-17 DIAGNOSIS — K5641 Fecal impaction: Secondary | ICD-10-CM | POA: Diagnosis present

## 2014-07-17 DIAGNOSIS — E876 Hypokalemia: Secondary | ICD-10-CM | POA: Diagnosis not present

## 2014-07-17 DIAGNOSIS — I10 Essential (primary) hypertension: Secondary | ICD-10-CM | POA: Diagnosis present

## 2014-07-17 DIAGNOSIS — K222 Esophageal obstruction: Secondary | ICD-10-CM | POA: Diagnosis present

## 2014-07-17 DIAGNOSIS — I350 Nonrheumatic aortic (valve) stenosis: Secondary | ICD-10-CM | POA: Diagnosis present

## 2014-07-17 DIAGNOSIS — N179 Acute kidney failure, unspecified: Secondary | ICD-10-CM | POA: Diagnosis present

## 2014-07-17 DIAGNOSIS — F411 Generalized anxiety disorder: Secondary | ICD-10-CM | POA: Diagnosis present

## 2014-07-17 DIAGNOSIS — R739 Hyperglycemia, unspecified: Secondary | ICD-10-CM | POA: Diagnosis present

## 2014-07-17 DIAGNOSIS — T18128A Food in esophagus causing other injury, initial encounter: Secondary | ICD-10-CM

## 2014-07-17 DIAGNOSIS — E039 Hypothyroidism, unspecified: Secondary | ICD-10-CM | POA: Diagnosis present

## 2014-07-17 DIAGNOSIS — R627 Adult failure to thrive: Secondary | ICD-10-CM | POA: Diagnosis present

## 2014-07-17 DIAGNOSIS — M8580 Other specified disorders of bone density and structure, unspecified site: Secondary | ICD-10-CM | POA: Diagnosis present

## 2014-07-17 DIAGNOSIS — Z515 Encounter for palliative care: Secondary | ICD-10-CM

## 2014-07-17 DIAGNOSIS — E46 Unspecified protein-calorie malnutrition: Secondary | ICD-10-CM | POA: Diagnosis present

## 2014-07-17 DIAGNOSIS — I509 Heart failure, unspecified: Secondary | ICD-10-CM

## 2014-07-17 DIAGNOSIS — M810 Age-related osteoporosis without current pathological fracture: Secondary | ICD-10-CM | POA: Diagnosis present

## 2014-07-17 DIAGNOSIS — R1314 Dysphagia, pharyngoesophageal phase: Secondary | ICD-10-CM | POA: Diagnosis not present

## 2014-07-17 DIAGNOSIS — R0602 Shortness of breath: Secondary | ICD-10-CM | POA: Diagnosis present

## 2014-07-17 DIAGNOSIS — N183 Chronic kidney disease, stage 3 unspecified: Secondary | ICD-10-CM | POA: Diagnosis present

## 2014-07-17 DIAGNOSIS — Z66 Do not resuscitate: Secondary | ICD-10-CM | POA: Diagnosis present

## 2014-07-17 DIAGNOSIS — Z681 Body mass index (BMI) 19 or less, adult: Secondary | ICD-10-CM | POA: Diagnosis not present

## 2014-07-17 DIAGNOSIS — I5032 Chronic diastolic (congestive) heart failure: Secondary | ICD-10-CM

## 2014-07-17 DIAGNOSIS — R609 Edema, unspecified: Secondary | ICD-10-CM

## 2014-07-17 HISTORY — DX: Gastritis, unspecified, without bleeding: K29.70

## 2014-07-17 HISTORY — DX: Anxiety disorder, unspecified: F41.9

## 2014-07-17 LAB — BRAIN NATRIURETIC PEPTIDE: B Natriuretic Peptide: 1163.7 pg/mL — ABNORMAL HIGH (ref 0.0–100.0)

## 2014-07-17 LAB — BASIC METABOLIC PANEL
Anion gap: 11 (ref 5–15)
BUN: 78 mg/dL — AB (ref 6–23)
CO2: 26 mmol/L (ref 19–32)
Calcium: 9.2 mg/dL (ref 8.4–10.5)
Chloride: 96 mmol/L (ref 96–112)
Creatinine, Ser: 3.06 mg/dL — ABNORMAL HIGH (ref 0.50–1.10)
GFR calc Af Amer: 15 mL/min — ABNORMAL LOW (ref >90)
GFR calc non Af Amer: 13 mL/min — ABNORMAL LOW (ref >90)
GLUCOSE: 154 mg/dL — AB (ref 70–99)
POTASSIUM: 4.5 mmol/L (ref 3.5–5.1)
Sodium: 133 mmol/L — ABNORMAL LOW (ref 135–145)

## 2014-07-17 LAB — CBC WITH DIFFERENTIAL/PLATELET
Basophils Absolute: 0 10*3/uL (ref 0.0–0.1)
Basophils Relative: 0 % (ref 0–1)
Eosinophils Absolute: 0 10*3/uL (ref 0.0–0.7)
Eosinophils Relative: 0 % (ref 0–5)
HCT: 30.5 % — ABNORMAL LOW (ref 36.0–46.0)
Hemoglobin: 10.3 g/dL — ABNORMAL LOW (ref 12.0–15.0)
Lymphocytes Relative: 5 % — ABNORMAL LOW (ref 12–46)
Lymphs Abs: 0.5 10*3/uL — ABNORMAL LOW (ref 0.7–4.0)
MCH: 29.1 pg (ref 26.0–34.0)
MCHC: 33.8 g/dL (ref 30.0–36.0)
MCV: 86.2 fL (ref 78.0–100.0)
Monocytes Absolute: 0.9 10*3/uL (ref 0.1–1.0)
Monocytes Relative: 9 % (ref 3–12)
Neutro Abs: 9 10*3/uL — ABNORMAL HIGH (ref 1.7–7.7)
Neutrophils Relative %: 86 % — ABNORMAL HIGH (ref 43–77)
Platelets: 300 10*3/uL (ref 150–400)
RBC: 3.54 MIL/uL — ABNORMAL LOW (ref 3.87–5.11)
RDW: 14.3 % (ref 11.5–15.5)
WBC: 10.5 10*3/uL (ref 4.0–10.5)

## 2014-07-17 LAB — URINALYSIS, ROUTINE W REFLEX MICROSCOPIC
BILIRUBIN URINE: NEGATIVE
Glucose, UA: NEGATIVE mg/dL
HGB URINE DIPSTICK: NEGATIVE
Ketones, ur: NEGATIVE mg/dL
Leukocytes, UA: NEGATIVE
Nitrite: NEGATIVE
Protein, ur: NEGATIVE mg/dL
SPECIFIC GRAVITY, URINE: 1.014 (ref 1.005–1.030)
UROBILINOGEN UA: 0.2 mg/dL (ref 0.0–1.0)
pH: 5 (ref 5.0–8.0)

## 2014-07-17 LAB — I-STAT TROPONIN, ED: Troponin i, poc: 0.01 ng/mL (ref 0.00–0.08)

## 2014-07-17 LAB — TSH: TSH: 3.109 u[IU]/mL (ref 0.350–4.500)

## 2014-07-17 MED ORDER — ASPIRIN EC 81 MG PO TBEC: 81.0000 mg | DELAYED_RELEASE_TABLET | Freq: Every day | ORAL | Status: DC

## 2014-07-17 MED ORDER — BISOPROLOL FUMARATE 10 MG PO TABS
10.0000 mg | ORAL_TABLET | Freq: Every day | ORAL | Status: DC
  Administered 2014-07-18 – 2014-07-27 (×10): 10 mg via ORAL
  Filled 2014-07-17 (×11): qty 1

## 2014-07-17 MED ORDER — VITAMIN D3 25 MCG (1000 UNIT) PO TABS
1000.0000 [IU] | ORAL_TABLET | Freq: Every morning | ORAL | Status: DC
  Administered 2014-07-18 – 2014-07-27 (×8): 1000 [IU] via ORAL
  Filled 2014-07-17 (×11): qty 1

## 2014-07-17 MED ORDER — SODIUM CHLORIDE (HYPERTONIC) 5 % OP SOLN
1.0000 [drp] | Freq: Every day | OPHTHALMIC | Status: DC
  Administered 2014-07-17 – 2014-07-28 (×11): 1 [drp] via OPHTHALMIC
  Filled 2014-07-17 (×3): qty 15

## 2014-07-17 MED ORDER — ADULT MULTIVITAMIN W/MINERALS CH
1.0000 | ORAL_TABLET | Freq: Every morning | ORAL | Status: DC
  Administered 2014-07-18 – 2014-07-27 (×8): 1 via ORAL
  Filled 2014-07-17 (×11): qty 1

## 2014-07-17 MED ORDER — MINOXIDIL 2.5 MG PO TABS
7.5000 mg | ORAL_TABLET | Freq: Every day | ORAL | Status: DC
  Administered 2014-07-18 – 2014-07-21 (×4): 7.5 mg via ORAL
  Filled 2014-07-17 (×4): qty 3

## 2014-07-17 MED ORDER — SODIUM CHLORIDE 0.9 % IV SOLN
Freq: Once | INTRAVENOUS | Status: AC
  Administered 2014-07-17: 15:00:00 via INTRAVENOUS

## 2014-07-17 MED ORDER — PANTOPRAZOLE SODIUM 40 MG PO TBEC
40.0000 mg | DELAYED_RELEASE_TABLET | Freq: Two times a day (BID) | ORAL | Status: DC
  Administered 2014-07-17 – 2014-07-26 (×16): 40 mg via ORAL
  Filled 2014-07-17 (×14): qty 1

## 2014-07-17 MED ORDER — ALBUTEROL SULFATE (2.5 MG/3ML) 0.083% IN NEBU
2.5000 mg | INHALATION_SOLUTION | Freq: Four times a day (QID) | RESPIRATORY_TRACT | Status: DC
Start: 2014-07-17 — End: 2014-07-18
  Administered 2014-07-17: 2.5 mg via RESPIRATORY_TRACT
  Filled 2014-07-17: qty 3

## 2014-07-17 MED ORDER — ACETAMINOPHEN 325 MG PO TABS
650.0000 mg | ORAL_TABLET | Freq: Four times a day (QID) | ORAL | Status: DC | PRN
Start: 2014-07-17 — End: 2014-07-29
  Administered 2014-07-23 – 2014-07-27 (×2): 650 mg via ORAL
  Filled 2014-07-17 (×2): qty 2

## 2014-07-17 MED ORDER — PROPYLENE GLYCOL 0.6 % OP SOLN: Freq: Three times a day (TID) | OPHTHALMIC | Status: DC

## 2014-07-17 MED ORDER — SODIUM CHLORIDE 0.9 % IV SOLN
INTRAVENOUS | Status: DC
  Administered 2014-07-17: 18:00:00 via INTRAVENOUS

## 2014-07-17 MED ORDER — HYDRALAZINE HCL 20 MG/ML IJ SOLN
10.0000 mg | Freq: Four times a day (QID) | INTRAMUSCULAR | Status: DC | PRN
Start: 2014-07-17 — End: 2014-07-25
  Administered 2014-07-25: 10 mg via INTRAVENOUS
  Filled 2014-07-17: qty 1

## 2014-07-17 MED ORDER — SODIUM CHLORIDE 0.9 % IJ SOLN
3.0000 mL | Freq: Two times a day (BID) | INTRAMUSCULAR | Status: DC
  Administered 2014-07-17 – 2014-07-29 (×24): 3 mL via INTRAVENOUS

## 2014-07-17 MED ORDER — OCUVITE-LUTEIN PO CAPS
1.0000 | ORAL_CAPSULE | Freq: Every day | ORAL | Status: DC
  Administered 2014-07-18 – 2014-07-27 (×8): 1 via ORAL
  Filled 2014-07-17 (×11): qty 1

## 2014-07-17 MED ORDER — CLONIDINE HCL 0.2 MG PO TABS
0.2000 mg | ORAL_TABLET | Freq: Three times a day (TID) | ORAL | Status: DC
  Administered 2014-07-17 – 2014-07-19 (×6): 0.2 mg via ORAL
  Filled 2014-07-17 (×8): qty 1

## 2014-07-17 MED ORDER — HEPARIN SODIUM (PORCINE) 5000 UNIT/ML IJ SOLN
5000.0000 [IU] | Freq: Three times a day (TID) | INTRAMUSCULAR | Status: DC
  Administered 2014-07-17 – 2014-07-26 (×25): 5000 [IU] via SUBCUTANEOUS
  Filled 2014-07-17 (×29): qty 1

## 2014-07-17 MED ORDER — ACETAMINOPHEN 650 MG RE SUPP: 650.0000 mg | Freq: Four times a day (QID) | RECTAL | Status: DC | PRN

## 2014-07-17 MED ORDER — LEVOTHYROXINE SODIUM 175 MCG PO TABS
175.0000 ug | ORAL_TABLET | Freq: Every day | ORAL | Status: DC
  Administered 2014-07-18 – 2014-07-29 (×11): 175 ug via ORAL
  Filled 2014-07-17 (×13): qty 1

## 2014-07-17 MED ORDER — ONDANSETRON HCL 4 MG PO TABS: 4.0000 mg | ORAL_TABLET | Freq: Four times a day (QID) | ORAL | Status: DC | PRN

## 2014-07-17 MED ORDER — AMLODIPINE BESYLATE 10 MG PO TABS
10.0000 mg | ORAL_TABLET | Freq: Every day | ORAL | Status: DC
  Administered 2014-07-18 – 2014-07-27 (×9): 10 mg via ORAL
  Filled 2014-07-17 (×11): qty 1

## 2014-07-17 MED ORDER — SODIUM CHLORIDE 0.9 % IV SOLN: INTRAVENOUS | Status: DC

## 2014-07-17 MED ORDER — POLYVINYL ALCOHOL 1.4 % OP SOLN
1.0000 [drp] | Freq: Three times a day (TID) | OPHTHALMIC | Status: DC
  Administered 2014-07-17 – 2014-07-29 (×34): 1 [drp] via OPHTHALMIC
  Filled 2014-07-17 (×3): qty 15

## 2014-07-17 MED ORDER — ATORVASTATIN CALCIUM 20 MG PO TABS
20.0000 mg | ORAL_TABLET | Freq: Every day | ORAL | Status: DC
  Administered 2014-07-18 – 2014-07-27 (×8): 20 mg via ORAL
  Filled 2014-07-17 (×11): qty 1

## 2014-07-17 MED ORDER — ONDANSETRON HCL 4 MG/2ML IJ SOLN
4.0000 mg | Freq: Four times a day (QID) | INTRAMUSCULAR | Status: DC | PRN
  Administered 2014-07-21 – 2014-07-27 (×8): 4 mg via INTRAVENOUS
  Filled 2014-07-17 (×8): qty 2

## 2014-07-17 MED ORDER — ASPIRIN EC 81 MG PO TBEC
81.0000 mg | DELAYED_RELEASE_TABLET | Freq: Every day | ORAL | Status: DC
  Administered 2014-07-17 – 2014-07-27 (×9): 81 mg via ORAL
  Filled 2014-07-17 (×12): qty 1

## 2014-07-17 MED ORDER — HYDRALAZINE HCL 50 MG PO TABS
100.0000 mg | ORAL_TABLET | Freq: Three times a day (TID) | ORAL | Status: DC
  Administered 2014-07-17 – 2014-07-27 (×31): 100 mg via ORAL
  Filled 2014-07-17 (×35): qty 2

## 2014-07-17 MED ORDER — DOCUSATE SODIUM 100 MG PO CAPS
100.0000 mg | ORAL_CAPSULE | Freq: Two times a day (BID) | ORAL | Status: DC
  Administered 2014-07-17 – 2014-07-22 (×8): 100 mg via ORAL
  Filled 2014-07-17 (×13): qty 1

## 2014-07-17 NOTE — ED Notes (Signed)
Pt SpO2 at 90% on RA. Pt placed on 2L [place] which improved her O2 to 95%

## 2014-07-17 NOTE — ED Provider Notes (Signed)
CSN: [id number]     Arrival date & time 07/17/14  1223 History   First MD Initiated Contact with Patient 07/17/14 1312     Chief Complaint  Patient presents with  . Shortness of Breath     (Consider location/radiation/quality/duration/timing/severity/associated sxs/prior Treatment) HPI   Rachael [name] is a(n) 81 y.o. female who presents to the emergency department with chief complaint of shortness of breath. Patient states that over the past 2 days. She's had worsening shortness of breath at rest. She denies orthopnea or PND, but does have a history of heart failure. She is currently on 40 mg of Lasix daily. Patient denies swelling in her legs. However, she does complain of a 2 pound weight gain in the past 2 days. She denies fevers, chills, myalgias, cough or symptoms of URI. She denies chest pain or dyspnea on exertion.   Rachael [name]  Past Medical History  Diagnosis Date  . Vitamin D deficiency   . Diverticulosis   . Coronary artery calcification seen on CAT scan   . TIA (transient ischemic attack)     Carotid dopplers (8/11): 40-50% RICA stenosis. Carotid dopplers (12/13) with 40-59% bilateral ICA stenoses.   . CKD (chronic kidney disease)   . Inguinal hernia   . Broken hip     bilateral  . Hypertension   . Hyperlipidemia   . Hypothyroidism   . Senile osteoporosis   . Osteopenia   . Elevated hemoglobin A1c   . Bradycardia     Holter monitor (8/11) with no significant bradycardia, frequent PACs, few short runs of slow atrial tachycardia.   . Chronic diastolic heart failure     a. Echo (6/15):  EF 60-65%, Gr 2 DD, mild AS (mean 8 mmHg);  b.  Echo (11/2013):  mild LVH, EF 55-60%, no RWMA, Gr 1 DD, mild AS (peak 19 mmHg), mild MR, trivial eff   . Aortic stenosis     a. mild (echo 6/15 with mean gradient 8 mmHg)  . Carotid stenosis     a. Carotid US (04/2012):  bilateral ICA 40-59%;  b.  Carotid US (8/15): Bilateral ICA 40-59% - follow up 1 year    Past Surgical History   Procedure Laterality Date  . Inguinal hernia repair    . Tonsillectomy    . Hip arthroplasty  04/01/2012    Procedure: ARTHROPLASTY BIPOLAR HIP;  Surgeon: Kerin Salen, MD;  Location: [facility];  Service: Orthopedics;  Laterality: Left;  . Compression hip screw  05/06/2012    Procedure: COMPRESSION HIP;  Surgeon: Jolyn Nap, MD;  Location: [facility] [place];  Service: Orthopedics;  Laterality: Right;  . Skin lesion excision      benignt; from shoulder  . Hip surgery      left  . Eye surgery      bilateral  . Eye surgery      tear duct R side  . Blepharoplasty  1983  . Melanoma excision  1991    Right Arm  . Total hip arthroplasty Right 2013  . Total hip arthroplasty Left 2013  . Esophagogastroduodenoscopy N/A 11/12/2013    Procedure: ESOPHAGOGASTRODUODENOSCOPY (EGD);  Surgeon: Jerene Bears, MD;  Location: [facility];  Service: Endoscopy;  Laterality: N/A;   Family History  Problem Relation Age of Onset  . CVA Mother     died age 63  . Emphysema Sister   . Colon cancer Neg Hx   . Other      patient does not know  History  Substance Use Topics  . Smoking status: Former Smoker    Types: Cigarettes    Quit date: 05/06/1971  . Smokeless tobacco: Never Used  . Alcohol Use: No     Comment: none since Nov 2013-was on 2 beers/night   OB History    No data available     Review of Systems  Constitutional: Negative for fever and chills.  HENT: Negative.   Eyes: Negative.   Respiratory: Positive for shortness of breath. Negative for cough, chest tightness and wheezing.   Cardiovascular: Negative for chest pain, palpitations and leg swelling.  Gastrointestinal: Negative.   Endocrine: Negative.   Genitourinary: Negative.   Musculoskeletal: Negative.   Neurological: Negative.   Psychiatric/Behavioral: Negative.   All other systems reviewed and are negative.     Allergies  Aricept; Prevacid; and Tylenol  Home Medications   Prior to Admission medications   Medication Sig  Start Date End Date Taking? Authorizing Provider  allopurinol (ZYLOPRIM) 100 MG tablet Take 1 tablet (100 mg total) by mouth every morning. 04/22/14   Unk Pinto, MD  amLODipine (NORVASC) 5 MG tablet Take 1 tablet (5 mg total) by mouth daily. 07/02/14   Wellington Hampshire, MD  aspirin EC 81 MG tablet Take 1 tablet (81 mg total) by mouth daily. 01/21/14   Larey Dresser, MD  atorvastatin (LIPITOR) 20 MG tablet Take 1 tablet (20 mg total) by mouth daily. 07/10/14   Unk Pinto, MD  Biotin 1000 MCG tablet Take 1,000 mcg by mouth every morning.     Historical Provider, MD  bisoprolol (ZEBETA) 10 MG tablet Take 1 tablet (10 mg total) by mouth daily. 12/02/13   Kinnie Feil, MD  cholecalciferol (VITAMIN D) 1000 UNITS tablet Take 1,000 Units by mouth every morning.     Historical Provider, MD  cloNIDine (CATAPRES) 0.2 MG tablet Take 1 tablet (0.2 mg total) by mouth 3 (three) times daily. 12/06/13 12/07/14  Unk Pinto, MD  furosemide (LASIX) 40 MG tablet Take 2 tablets (80 mg total) by mouth daily. 06/28/14   Larey Dresser, MD  hydrALAZINE (APRESOLINE) 100 MG tablet Take 1 tablet (100 mg total) by mouth 3 (three) times daily. 06/05/14 06/05/15  Amy D Clegg, NP  levothyroxine (SYNTHROID, LEVOTHROID) 175 MCG tablet Take 175 mcg by mouth daily before breakfast. Pt taking one half tablet on Sun, Tue. Thur.,and Sat.    Historical Provider, MD  minoxidil (LONITEN) 2.5 MG tablet Take 1 to 2 tablets daily for BP as directed 07/04/14 09/02/14  Unk Pinto, MD  Multiple Vitamin (MULTIVITAMIN WITH MINERALS) TABS Take 1 tablet by mouth every morning.     Historical Provider, MD  pantoprazole (PROTONIX) 40 MG tablet Take 1 tablet (40 mg total) by mouth 2 (two) times daily before a meal. 04/22/14   Unk Pinto, MD  polyvinyl alcohol (LIQUIFILM TEARS) 1.4 % ophthalmic solution Place 1-2 drops into both eyes as needed for dry eyes.    Historical Provider, MD  potassium chloride SA (K-DUR,KLOR-CON) 20 MEQ  tablet Take 1 tablet (20 mEq total) by mouth daily. Patient taking differently: Take 20 mEq by mouth. Taking 20 meq alternating with 40 meq every other day. 07/02/14   Larey Dresser, MD  Propylene Glycol (SYSTANE BALANCE OP) Apply 1-2 drops to eye 3 (three) times daily.    Historical Provider, MD  sodium chloride (MURO 128) 5 % ophthalmic solution Place 1 drop into both eyes at bedtime.    Historical Provider, MD  spironolactone (ALDACTONE) 25 MG tablet Take 1 tablet (25 mg total) by mouth daily. 06/17/14   Larey Dresser, MD   BP 133/58 mmHg  Pulse 67  Temp(Src) 97.8 F (36.6 C) (Oral)  Resp 15  SpO2 94% Physical Exam  Constitutional: She is oriented to person, place, and time. She appears well-developed and well-nourished. No distress.  HENT:  Head: Normocephalic and atraumatic.  Eyes: Conjunctivae are normal. No scleral icterus.  Neck: Normal range of motion. JVD present.  Cardiovascular: Normal rate, regular rhythm, normal heart sounds and intact distal pulses.  Exam reveals no gallop and no friction rub.   No murmur heard. No peripheral edema  Pulmonary/Chest: Breath sounds normal. No respiratory distress.  This patient can speak in full sentences, but hasn't labored. Breath sounds. Breath sounds are diminished over the left middle lobe and bilatera fine basal noted on examination.  Abdominal: Soft. Bowel sounds are normal. She exhibits no distension and no mass. There is no tenderness. There is no guarding.  Neurological: She is alert and oriented to person, place, and time.  Skin: Skin is warm and dry. She is not diaphoretic.  Nursing note and vitals reviewed.   ED Course  Procedures (including critical care time) Labs Review Labs Reviewed  BASIC METABOLIC PANEL - Abnormal; Notable for the following:    Sodium 133 (*)    Glucose, Bld 154 (*)    BUN 78 (*)    Creatinine, Ser 3.06 (*)    GFR calc non Af Amer 13 (*)    GFR calc Af Amer 15 (*)    All other components within  normal limits  BRAIN NATRIURETIC PEPTIDE - Abnormal; Notable for the following:    B Natriuretic Peptide 1163.7 (*)    All other components within normal limits  CBC WITH DIFFERENTIAL/PLATELET - Abnormal; Notable for the following:    RBC 3.54 (*)    Hemoglobin 10.3 (*)    HCT 30.5 (*)    Neutrophils Relative % 86 (*)    Neutro Abs 9.0 (*)    Lymphocytes Relative 5 (*)    Lymphs Abs 0.5 (*)    All other components within normal limits  I-STAT TROPOININ, ED    Imaging Review Dg Chest 2 View  07/17/2014   CLINICAL DATA:  Worsening shortness of breath over 2 days  EXAM: CHEST  2 VIEW  COMPARISON:  11/29/2013  FINDINGS: Cardiac shadow is within normal limits. Mild vascular congestion is seen. Small bilateral pleural effusions are noted but improved from the prior exam. Bibasilar scarring is seen. Bilateral nipple shadows are noted.  IMPRESSION: Small bilateral pleural effusions.  Mild vascular congestion.   Electronically Signed   By: [name] M.D.   On: 07/17/2014 13:49     EKG Interpretation   Date/Time:  Wednesday July 17 2014 12:27:55 EST Ventricular Rate:  69 PR Interval:  158 QRS Duration: 76 QT Interval:  428 QTC Calculation: 458 R Axis:   74 Text Interpretation:  Normal sinus rhythm Possible Left atrial enlargement  Septal infarct , age undetermined Abnormal ECG Sinus rhythm Artifact T  wave abnormality Abnormal ekg Confirmed by Carmin Muskrat  MD [phone]) on  07/17/2014 3:06:41 PM      MDM   Final diagnoses:  Acute on chronic diastolic congestive heart failure  AKI (acute kidney injury)    3:03 PM Patient's troponin is negative, she does appear to have acute kidney injury with a jump in her creatinine from about 2-3.06. She simultaneously  has elevated proBNP at 1163. The patient's shortness of breath is likely secondary to small bilateral pleural effusions and vascular congestion. I have begun the patient on gentle fluids at 75 mL per hour. However, I do  feel she will likely need some diuresis. However, given her bump in creatinine of feel  That she will need gentle hydration as well.   3:35 PM BP 154/53 mmHg  Pulse 69  Temp(Src) 97.8 F (36.6 C) (Oral)  Resp 18  SpO2 93% Patient with chf exacerbation and aki, Admitted by PA Imogene Burn  The patient appears reasonably stabilized for admission considering the current resources, flow, and capabilities available in the ED at this time, and I doubt any other [facility] requiring further screening and/or treatment in the ED prior to admission.    Margarita Mail, PA-C 07/17/14 1539  Carmin Muskrat, MD 07/17/14 [phone]

## 2014-07-17 NOTE — ED Notes (Signed)
Pt sts increased SOB and swelling in legs; pt sts hx of CHF

## 2014-07-17 NOTE — H&P (Signed)
[facility] History and Physical                                                                                    Rachael [name], is a 81 y.o. female  MRN: [id number]   DOB - 12/30/1923  Admit Date - 07/17/2014  Outpatient Primary MD for the patient is Alesia Richards, MD  Cardiologist: Loralie Champagne, M.D.  With History of -  Past Medical History  Diagnosis Date  . Vitamin D deficiency   . Diverticulosis   . Coronary artery calcification seen on CAT scan   . TIA (transient ischemic attack)     Carotid dopplers (8/11): 40-50% RICA stenosis. Carotid dopplers (12/13) with 40-59% bilateral ICA stenoses.   . CKD (chronic kidney disease)   . Inguinal hernia   . Broken hip     bilateral  . Hypertension   . Hyperlipidemia   . Hypothyroidism   . Senile osteoporosis   . Osteopenia   . Elevated hemoglobin A1c   . Bradycardia     Holter monitor (8/11) with no significant bradycardia, frequent PACs, few short runs of slow atrial tachycardia.   . Chronic diastolic heart failure     a. Echo (6/15):  EF 60-65%, Gr 2 DD, mild AS (mean 8 mmHg);  b.  Echo (11/2013):  mild LVH, EF 55-60%, no RWMA, Gr 1 DD, mild AS (peak 19 mmHg), mild MR, trivial eff   . Aortic stenosis     a. mild (echo 6/15 with mean gradient 8 mmHg)  . Carotid stenosis     a. Carotid US (04/2012):  bilateral ICA 40-59%;  b.  Carotid US (8/15): Bilateral ICA 40-59% - follow up 1 year       Past Surgical History  Procedure Laterality Date  . Inguinal hernia repair    . Tonsillectomy    . Hip arthroplasty  04/01/2012    Procedure: ARTHROPLASTY BIPOLAR HIP;  Surgeon: Kerin Salen, MD;  Location: [facility];  Service: Orthopedics;  Laterality: Left;  . Compression hip screw  05/06/2012    Procedure: COMPRESSION HIP;  Surgeon: Jolyn Nap, MD;  Location: [facility];  Service: Orthopedics;  Laterality: Right;  . Skin lesion excision      benignt; from shoulder  . Hip surgery      left  . Eye surgery      bilateral   . Eye surgery      tear duct R side  . Blepharoplasty  1983  . Melanoma excision  1991    Right Arm  . Total hip arthroplasty Right 2013  . Total hip arthroplasty Left 2013  . Esophagogastroduodenoscopy N/A 11/12/2013    Procedure: ESOPHAGOGASTRODUODENOSCOPY (EGD);  Surgeon: Jerene Bears, MD;  Location: [facility];  Service: Endoscopy;  Laterality: N/A;    in for   Chief Complaint  Patient presents with  . Shortness of Breath     HPI  Rachael [name]  is a 81 y.o. female, with a past medical history of renal artery stenosis, refractory hypertension, diastolic heart failure, TIA, aortic stenosis. She presents to the emergency department with shortness of breath that started on Sunday. The  patient reports reduced exercise tolerance, trembling, and her family friend in the room states that her face is flushed pink which is abnormal for her. She also complains of a nonproductive cough. The patient complains of an unquenchable thirst despite drinking large amounts of cold water recently. She states that she's been to her family physician several times recently for shortness of breath. Her PCP is focused on her uncontrolled hypertension and has recently decreased her clonidine and increased her minoxidil. The patient complains of small hard black stools, insomnia, and urinary frequency. She had melanoma removal surgery on her back approximately 3 weeks ago.  In the ER the patient is found to have an elevated creatinine. Baseline appears to be 1.4-1.7. Today it is currently 3.0. Her BUN is markedly elevated at 78. While her BNP is 1100+, 6 months ago it was 4300+. Consequently, the patient is being admitted for acute on chronic renal failure with dehydration.  Review of Systems   In addition to the HPI above,  No Fever-chills, No Headache, No changes with Vision or hearing, No problems swallowing food or Liquids, No Abdominal pain, No Nausea or Vomiting, Bowel movements are regular, No  Blood in stool or Urine, No dysuria, No new skin rashes or bruises, No new joints pains-aches,  No new weakness, tingling, numbness in any extremity, No recent weight gain or loss, A full 10 point Review of Systems was done, except as stated above, all other Review of Systems were negative.  Social History History  Substance Use Topics  . Smoking status: Former Smoker    Types: Cigarettes    Quit date: 05/06/1971  . Smokeless tobacco: Never Used  . Alcohol Use: No     Comment: none since Nov 2013-was on 2 beers/night    Family History Family History  Problem Relation Age of Onset  . CVA Mother     died age 75  . Emphysema Sister   . Colon cancer Neg Hx   . Other      patient does not know    Prior to Admission medications   Medication Sig Start Date End Date Taking? Authorizing Provider  allopurinol (ZYLOPRIM) 100 MG tablet Take 1 tablet (100 mg total) by mouth every morning. 04/22/14  Yes Unk Pinto, MD  amLODipine (NORVASC) 5 MG tablet Take 1 tablet (5 mg total) by mouth daily. 07/02/14  Yes Wellington Hampshire, MD  aspirin EC 81 MG tablet Take 1 tablet (81 mg total) by mouth daily. 01/21/14  Yes Larey Dresser, MD  atorvastatin (LIPITOR) 20 MG tablet Take 1 tablet (20 mg total) by mouth daily. 07/10/14  Yes Unk Pinto, MD  Biotin 1000 MCG tablet Take 1,000 mcg by mouth every morning.    Yes Historical Provider, MD  bisoprolol (ZEBETA) 10 MG tablet Take 1 tablet (10 mg total) by mouth daily. 12/02/13  Yes Kinnie Feil, MD  cholecalciferol (VITAMIN D) 1000 UNITS tablet Take 1,000 Units by mouth every morning.    Yes Historical Provider, MD  cloNIDine (CATAPRES) 0.2 MG tablet Take 1 tablet (0.2 mg total) by mouth 3 (three) times daily. 12/06/13 12/07/14 Yes Unk Pinto, MD  furosemide (LASIX) 40 MG tablet Take 2 tablets (80 mg total) by mouth daily. 06/28/14  Yes Larey Dresser, MD  hydrALAZINE (APRESOLINE) 100 MG tablet Take 1 tablet (100 mg total) by mouth 3 (three)  times daily. 06/05/14 06/05/15 Yes Amy D Clegg, NP  levothyroxine (SYNTHROID, LEVOTHROID) 175 MCG tablet Take 175 mcg by  mouth daily before breakfast. Pt taking one half tablet on Sun, Tue. Thur.,and Sat.   Yes Historical Provider, MD  minoxidil (LONITEN) 2.5 MG tablet Take 1 to 2 tablets daily for BP as directed Patient taking differently: Take 7.5 mg by mouth daily.  07/04/14 09/02/14 Yes Unk Pinto, MD  Multiple Vitamin (MULTIVITAMIN WITH MINERALS) TABS Take 1 tablet by mouth every morning.    Yes Historical Provider, MD  pantoprazole (PROTONIX) 40 MG tablet Take 1 tablet (40 mg total) by mouth 2 (two) times daily before a meal. 04/22/14  Yes Unk Pinto, MD  potassium chloride SA (K-DUR,KLOR-CON) 20 MEQ tablet Take 1 tablet (20 mEq total) by mouth daily. Patient taking differently: Take 20-40 mEq by mouth. Taking 20 meq alternating with 40 meq every other day. 07/02/14  Yes Larey Dresser, MD  Propylene Glycol (SYSTANE BALANCE OP) Apply 1-2 drops to eye 3 (three) times daily.   Yes Historical Provider, MD  sodium chloride (MURO 128) 5 % ophthalmic solution Place 1 drop into both eyes at bedtime.   Yes Historical Provider, MD  spironolactone (ALDACTONE) 25 MG tablet Take 1 tablet (25 mg total) by mouth daily. 06/17/14  Yes Larey Dresser, MD    Allergies  Allergen Reactions  . Aricept [Donepezil Hcl] Nausea And Vomiting    Nausea and vomiting  . Prevacid [Lansoprazole] Nausea And Vomiting    GI upset  . Tylenol [Acetaminophen] Nausea Only    Tylenol #3 - Nausea    Physical Exam  Vitals  Blood pressure 171/80, pulse 74, temperature 98.9 F (37.2 C), temperature source Oral, resp. rate 22, height 5' 8.75" (1.746 m), weight 55.067 kg (121 lb 6.4 oz), SpO2 94 %.   General:  Thin, frail, elderly female lying in bed in NAD, warm to the touch, cheeks are flushed pink. Friend at bedside  Psych:  Normal affect and insight, Not Suicidal or Homicidal, Awake Alert, Oriented X 3.  Neuro:    No F.N deficits, ALL C.Nerves Intact, Strength 5/5 all 4 extremities, Sensation intact all 4 extremities.  ENT:  Ears and Eyes appear Normal, Conjunctivae clear, PER. Mucous membranes are dry.  Neck:  Supple, No lymphadenopathy appreciated  Respiratory:  Symmetrical chest wall movement, Good air movement bilaterally, CTAB.  Cardiac:  RRR, No Murmurs, no LE edema noted, no JVD.    Abdomen:  Positive bowel sounds, Soft, Non tender, Non distended,  No masses appreciated  Skin:  No Cyanosis, Normal Skin Turgor, No Skin Rash or Bruise.  Extremities:  Able to move all 4. 5/5 strength in each,  no effusions.  Data Review  CBC  Recent Labs Lab 07/17/14 1235  WBC 10.5  HGB 10.3*  HCT 30.5*  PLT 300  MCV 86.2  MCH 29.1  MCHC 33.8  RDW 14.3  LYMPHSABS 0.5*  MONOABS 0.9  EOSABS 0.0  BASOSABS 0.0    Chemistries   Recent Labs Lab 07/17/14 1235  NA 133*  K 4.5  CL 96  CO2 26  GLUCOSE 154*  BUN 78*  CREATININE 3.06*  CALCIUM 9.2    Imaging results:   Dg Chest 2 View  07/17/2014   CLINICAL DATA:  Worsening shortness of breath over 2 days  EXAM: CHEST  2 VIEW  COMPARISON:  11/29/2013  FINDINGS: Cardiac shadow is within normal limits. Mild vascular congestion is seen. Small bilateral pleural effusions are noted but improved from the prior exam. Bibasilar scarring is seen. Bilateral nipple shadows are noted.  IMPRESSION: Small bilateral  pleural effusions.  Mild vascular congestion.   Electronically Signed   By: [name] M.D.   On: 07/17/2014 13:49    My personal review of EKG: NSR, No ST changes noted.   Assessment & Plan  Principal Problem:   Acute renal failure Active Problems:   CKD (chronic kidney disease), stage III   Hypertension   Hypothyroidism   Renal artery stenosis   CHF (congestive heart failure)  Acute renal failure Secondary to dehydration, exacerbated by diuretics. Will give the patient gentle IV fluids while monitoring her blood pressure and  volume status closely. Hold Lasix and spironolactone. If creatinine is not decrease consider eliminating minoxidil, and possibly nephrology consultation.  Shortness of breath Patient has been complaining of shortness of breath for over a week.   She is not volume overloaded.  Chest x-ray appears clear other than small pleural effusions which were previously present. We'll check a TSH. She is currently on 2 L of oxygen. Will give albuterol inhaler to see if that helps her symptoms. Incentive spirometry.  High blood pressure Currently with moderate control.  History of renal artery stenosis. Will continue Norvasc at 10 mg, bisoprolol, clonidine, hydralazine, and minoxidil. Currently holding diuretics.  Diastolic heart failure Most recent 2-D echo shows an LVEF of 55-60% in July 2015. Patient is currently dehydrated with no volume overload. Will request daily weights, and heart healthy diet.  Normocytic Anemia. Appears chronic.  Stable for outpatient work up.  Urinary frequency Check UA and culture.  Hyperglycemia cbg 154 in the ER.  No history of diabetes. We'll check hemoglobin A1c.   DVT Prophylaxis: Heparin  AM Labs Ordered, also please review Full Orders  Family Communication:   Family friend, [name], at bedside.  Code Status:  Full code (please recheck with patient)  Condition:  Guarded  Time spent in minutes : [address],  PA-C on 07/17/2014 at 5:51 PM  Between 7am to 7pm - Pager - [phone]  After 7pm go to [email or web address] - password [email or web address]  And look for the night coverage person covering me after hours  [facility]

## 2014-07-17 NOTE — Progress Notes (Signed)
Patient came to the floor around 1700, alert oriented, no c/o pain, shortness, or dizziness, BP is elevated, see flowsheets for recent v/s. Will continue to monitor patient.

## 2014-07-18 LAB — URINE CULTURE
Colony Count: NO GROWTH
Culture: NO GROWTH

## 2014-07-18 LAB — CBC
HCT: 27.3 % — ABNORMAL LOW (ref 36.0–46.0)
HEMOGLOBIN: 8.9 g/dL — AB (ref 12.0–15.0)
MCH: 28.3 pg (ref 26.0–34.0)
MCHC: 32.6 g/dL (ref 30.0–36.0)
MCV: 86.9 fL (ref 78.0–100.0)
PLATELETS: 272 10*3/uL (ref 150–400)
RBC: 3.14 MIL/uL — AB (ref 3.87–5.11)
RDW: 14.5 % (ref 11.5–15.5)
WBC: 9.1 10*3/uL (ref 4.0–10.5)

## 2014-07-18 LAB — BASIC METABOLIC PANEL
ANION GAP: 16 — AB (ref 5–15)
BUN: 79 mg/dL — ABNORMAL HIGH (ref 6–23)
CO2: 21 mmol/L (ref 19–32)
Calcium: 8.6 mg/dL (ref 8.4–10.5)
Chloride: 99 mmol/L (ref 96–112)
Creatinine, Ser: 3.16 mg/dL — ABNORMAL HIGH (ref 0.50–1.10)
GFR calc non Af Amer: 12 mL/min — ABNORMAL LOW (ref >90)
GFR, EST AFRICAN AMERICAN: 14 mL/min — AB (ref >90)
Glucose, Bld: 121 mg/dL — ABNORMAL HIGH (ref 70–99)
POTASSIUM: 4.4 mmol/L (ref 3.5–5.1)
Sodium: 136 mmol/L (ref 135–145)

## 2014-07-18 LAB — CREATININE, URINE, RANDOM: Creatinine, Urine: 87.57 mg/dL

## 2014-07-18 LAB — SODIUM, URINE, RANDOM: Sodium, Ur: <10 mmol/L

## 2014-07-18 LAB — OSMOLALITY: Osmolality: 309 mOsm/kg — ABNORMAL HIGH (ref 275–300)

## 2014-07-18 MED ORDER — FUROSEMIDE 10 MG/ML IJ SOLN
40.0000 mg | Freq: Two times a day (BID) | INTRAMUSCULAR | Status: DC
  Administered 2014-07-18 (×2): 40 mg via INTRAVENOUS
  Filled 2014-07-18 (×4): qty 4

## 2014-07-18 MED ORDER — SODIUM CHLORIDE 0.9 % IV SOLN: INTRAVENOUS | Status: DC

## 2014-07-18 MED ORDER — ALBUTEROL SULFATE (2.5 MG/3ML) 0.083% IN NEBU: 2.5000 mg | INHALATION_SOLUTION | Freq: Four times a day (QID) | RESPIRATORY_TRACT | Status: DC | PRN

## 2014-07-18 NOTE — Evaluation (Signed)
Occupational Therapy Evaluation Patient Details Name: Rachael [name] MRN: [id number] DOB: Feb 16, 1924 Today's Date: 07/18/2014    History of Present Illness Rachael [name] is a 81 y.o. female, with a past medical history of renal artery stenosis, refractory hypertension, diastolic heart failure, TIA, aortic stenosis. She presents to the emergency department with shortness of breath that started on Sunday. The patient reports reduced exercise tolerance, trembling, and her family friend in the room states that her face is flushed pink which is abnormal for her. She also complains of a nonproductive cough. The patient complains of an unquenchable thirst despite drinking large amounts of cold water recently.   Clinical Impression   This 81 yo female admitted with above presents to acute OT with decreased endurance, increased work of breathing with decreased sats with activity, decreased balance all affecting her ability to care for herself at an independent level as she was pta. She will benefit from acute OT with follow up [place].    Follow Up Recommendations  Home health OT    Equipment Recommendations  None recommended by OT       Precautions / Restrictions Precautions Precautions: Fall Restrictions Weight Bearing Restrictions: No      Mobility Bed Mobility Overal bed mobility: Independent                Transfers Overall transfer level: Needs assistance Equipment used: None Transfers: Sit to/from Stand Sit to Stand: Supervision         General transfer comment: min guard A 139feet without AD    Balance Overall balance assessment: Needs assistance Sitting-balance support: Feet supported;No upper extremity supported Sitting balance-Leahy Scale: Good     Standing balance support: No upper extremity supported Standing balance-Leahy Scale: Fair                              ADL Overall ADL's : Needs assistance/impaired Eating/Feeding:  Independent;Sitting   Grooming: Min guard;Standing   Upper Body Bathing: Set up;Sitting   Lower Body Bathing: Min guard;Sit to/from stand   Upper Body Dressing : Set up;Sitting   Lower Body Dressing: Min guard;Sit to/from stand   Toilet Transfer: Min guard;Ambulation (bed>down hall>back to room)   Toileting- Water quality scientist and Hygiene: Min guard;Sit to/from stand                         Pertinent Vitals/Pain Pain Assessment: No/denies pain     Hand Dominance Right   Extremity/Trunk Assessment Upper Extremity Assessment Upper Extremity Assessment: Overall WFL for tasks assessed   Lower Extremity Assessment Lower Extremity Assessment: Defer to PT evaluation       Communication Communication Communication: No difficulties   Cognition Arousal/Alertness: Awake/alert Behavior During Therapy: WFL for tasks assessed/performed Overall Cognitive Status: Within Functional Limits for tasks assessed                                Home Living Family/patient expects to be discharged to:: Private residence Living Arrangements: Alone Available Help at Discharge: Friend(s);Available PRN/intermittently Type of Home: House Home Access: Stairs to enter [employer] of Steps: 1 Entrance Stairs-Rails: None Home Layout: One level     Bathroom Shower/Tub: Walk-in Hydrologist: Standard Bathroom Accessibility: Yes   Home Equipment: Environmental consultant - 2 wheels;Wheelchair - manual;Cane - single point;Shower seat;Toilet riser  Prior Functioning/Environment Level of Independence: Independent with assistive device(s)        Comments: states she usually ambulates around home without assistive device; has a RW to use at night if she needs it when getting up to the bathroom and has a single point cane that she keeps in her car.    OT Diagnosis: Generalized weakness   OT Problem List: Decreased strength;Impaired balance  (sitting and/or standing);Cardiopulmonary status limiting activity   OT Treatment/Interventions: Self-care/ADL training;Energy conservation;DME and/or AE instruction;Balance training;Patient/family education    OT Goals(Current goals can be found in the care plan section) Acute Rehab OT Goals Patient Stated Goal: to be able to return home OT Goal Formulation: With patient Time For Goal Achievement: 07/25/14 Potential to Achieve Goals: Good  OT Frequency: Min 2X/week              End of Session    Activity Tolerance: Patient tolerated treatment well Patient left: in chair (at sink with NT in room)   Time: 1115-5208 OT Time Calculation (min): 20 min Charges:  OT General Charges $OT Visit: 1 Procedure OT Evaluation $Initial OT Evaluation Tier I: 1 Procedure  Almon Register [id number] 07/18/2014, 10:36 AM

## 2014-07-18 NOTE — Progress Notes (Addendum)
Patient Demographics  Rachael [name], is a 81 y.o. female, DOB - [birth year], NOM:[id number]  Admit date - 07/17/2014   Admitting Physician Louellen Molder, MD  Outpatient Primary MD for the patient is Alesia Richards, MD  LOS - 1   Chief Complaint  Patient presents with  . Shortness of Breath        Subjective:   Rachael [name] today has, No headache, No chest pain, No abdominal pain - No Nausea, No new weakness tingling or numbness, No Cough - continues to have shortness of breath.  Assessment & Plan    1. Acute respiratory, fluid overload due to acute on chronic diastolic CHF EF 96% on recent echo. Does have evidence of fluid overload, stop IV fluids, IV Lasix, continue beta blocker. Supportive care and monitor   2. ARF and the kidney disease stage IV. Baseline creatinine around 2, diurese and monitor. Likely due to CHF decompensation. Avoid nephrotoxins.   3. Essential hypertension. Continue home medications unchanged at this point.   4. Hyperglycemia in the ER. Likely due to stress. Recent A1c was 5.6.     Code Status: Full  Family Communication: None  Disposition Plan: To be decided   Procedures    Consults  none   Medications  Scheduled Meds: . amLODipine  10 mg Oral Daily  . aspirin EC  81 mg Oral Daily  . atorvastatin  20 mg Oral Daily  . bisoprolol  10 mg Oral Daily  . cholecalciferol  1,000 Units Oral q morning - 10a  . cloNIDine  0.2 mg Oral TID  . docusate sodium  100 mg Oral BID  . furosemide  40 mg Intravenous BID  . heparin  5,000 Units Subcutaneous 3 times per day  . hydrALAZINE  100 mg Oral TID  . levothyroxine  175 mcg Oral QAC breakfast  . minoxidil  7.5 mg Oral Daily  . multivitamin with minerals  1 tablet Oral q morning - 10a  .  multivitamin-lutein  1 capsule Oral Daily  . pantoprazole  40 mg Oral BID AC  . polyvinyl alcohol  1 drop Both Eyes TID  . sodium chloride  1 drop Both Eyes QHS  . sodium chloride  3 mL Intravenous Q12H   Continuous Infusions:  PRN Meds:.acetaminophen **OR** acetaminophen, albuterol, hydrALAZINE, ondansetron **OR** ondansetron (ZOFRAN) IV  DVT Prophylaxis   Heparin  Lab Results  Component Value Date   PLT 272 07/18/2014    Antibiotics    Anti-infectives    None          Objective:   Filed Vitals:   07/17/14 2238 07/18/14 0156 07/18/14 0519 07/18/14 1018  BP: 130/50 129/35 131/37   Pulse:  72 70   Temp:  98.7 F (37.1 C) 98 F (36.7 C)   TempSrc:  Oral Oral   Resp:  17 17   Height:      Weight:   55.5 kg (122 lb 5.7 oz)   SpO2:  93% 91% 95%    Wt Readings from Last 3 Encounters:  07/18/14 55.5 kg (122 lb 5.7 oz)  07/15/14 55.611 kg (122 lb 9.6 oz)  07/11/14 55.883 kg (123 lb 3.2 oz)     Intake/Output Summary (Last 24 hours) at 07/18/14  [place] filed at 07/18/14 0857  Gross per 24 hour  Intake  980.5 ml  Output    475 ml  Net  505.5 ml     Physical Exam  Awake Alert, Oriented X 3, No new F.N deficits, Normal affect Rachael [name].AT,PERRAL Supple Neck,No JVD, No cervical lymphadenopathy appriciated.  Symmetrical Chest wall movement, Good air movement bilaterally, rales RRR,No Gallops,Rubs or new Murmurs, No Parasternal Heave +ve B.Sounds, Abd Soft, No tenderness, No organomegaly appriciated, No rebound - guarding or rigidity. No Cyanosis, Clubbing , trace edema, No new Rash or bruise     Data Review   Micro Results No results found for this or any previous visit (from the past 240 hour(s)).  Radiology Reports Dg Chest 2 View  07/17/2014   CLINICAL DATA:  Worsening shortness of breath over 2 days  EXAM: CHEST  2 VIEW  COMPARISON:  11/29/2013  FINDINGS: Cardiac shadow is within normal limits. Mild vascular congestion is seen. Small bilateral pleural  effusions are noted but improved from the prior exam. Bibasilar scarring is seen. Bilateral nipple shadows are noted.  IMPRESSION: Small bilateral pleural effusions.  Mild vascular congestion.   Electronically Signed   By: [name] M.D.   On: 07/17/2014 13:49     CBC  Recent Labs Lab 07/17/14 1235 07/18/14 0411  WBC 10.5 9.1  HGB 10.3* 8.9*  HCT 30.5* 27.3*  PLT 300 272  MCV 86.2 86.9  MCH 29.1 28.3  MCHC 33.8 32.6  RDW 14.3 14.5  LYMPHSABS 0.5*  --   MONOABS 0.9  --   EOSABS 0.0  --   BASOSABS 0.0  --     Chemistries   Recent Labs Lab 07/17/14 1235 07/18/14 0411  NA 133* 136  K 4.5 4.4  CL 96 99  CO2 26 21  GLUCOSE 154* 121*  BUN 78* 79*  CREATININE 3.06* 3.16*  CALCIUM 9.2 8.6   ------------------------------------------------------------------------------------------------------------------ estimated creatinine clearance is 10.8 mL/min (by C-G formula based on Cr of 3.16). ------------------------------------------------------------------------------------------------------------------ No results for input(s): HGBA1C in the last 72 hours. ------------------------------------------------------------------------------------------------------------------ No results for input(s): CHOL, HDL, LDLCALC, TRIG, CHOLHDL, LDLDIRECT in the last 72 hours. ------------------------------------------------------------------------------------------------------------------  Recent Labs  07/17/14 2044  TSH 3.109   ------------------------------------------------------------------------------------------------------------------ No results for input(s): VITAMINB12, FOLATE, FERRITIN, TIBC, IRON, RETICCTPCT in the last 72 hours.  Coagulation profile No results for input(s): INR, PROTIME in the last 168 hours.  No results for input(s): DDIMER in the last 72 hours.  Cardiac Enzymes No results for input(s): CKMB, TROPONINI, MYOGLOBIN in the last 168 hours.  Invalid input(s):  CK ------------------------------------------------------------------------------------------------------------------ Invalid input(s): POCBNP     Time Spent in minutes   35   Nickoles Gregori K M.D on 07/18/2014 at 12:43 PM  Between 7am to 7pm - Pager - [phone]  After 7pm go to [email or web address] - password [email or web address]  [facility]   Office  [phone]

## 2014-07-18 NOTE — Clinical Documentation Improvement (Signed)
Pt. "Complains of SOB, nonproductive cough. BP 171/80 with moderate control" Noted in progress note"Diastolic heart failure Most recent 2-D echo shows an LVEF of 55-60% in July 2015. Patient is currently dehydrated with no volume overload"  Please indicate the level of diastolic heart failure and if HTN, CKD and HF are linked.  . Document acuity --Acute --Chronic --Acute on Chronic   . Due to or associated with --Cardiac __Hypertension __Hypertensive heart disease with HF __Hypertensive heart disease with HF and CKD  --Other (specify)  Supporting Information: BNP: 1100   acute on chronic renal failure stage III Meds : NORVASC 5mg  daily, ZEBETA 10 g daily, minoxidil daily, Apresoline po and IV prn Her PCP is focused on her uncontrolled hypertension and has recently decreased her clonidine and increased her minoxidil  Treatment: Continue home meds, gentle IVF hydration, Lasix 40 mg IV BID now hold  Thank You,  Melvia Heaps, RN, BSN, CDI [phone] [place]  [facility]

## 2014-07-18 NOTE — Progress Notes (Signed)
OT Cancellation Note  Patient Details Name: Rachael [name] MRN: [id number] DOB: April 04, 1924   Cancelled Treatment:    Reason Eval/Treat Not Completed: Other (comment). Pt currently eating breakfast, will try back as schedule allows.  Almon Register [id number] 07/18/2014, 8:34 AM

## 2014-07-18 NOTE — Evaluation (Signed)
Physical Therapy Evaluation Patient Details Name: Rachael [name] MRN: [id number] DOB: [birth year] Today's Date: 07/18/2014   History of Present Illness  Rachael [name] is a 81 y.o. female, with a past medical history of renal artery stenosis, refractory hypertension, diastolic heart failure, TIA, aortic stenosis. She presents to the emergency department with shortness of breath that started on Sunday. The patient reports reduced exercise tolerance, trembling, and her family friend in the room states that her face is flushed pink which is abnormal for her. She also complains of a nonproductive cough. The patient complains of an unquenchable thirst despite drinking large amounts of cold water recently.  Clinical Impression  Pt admitted with above diagnosis. Pt currently with functional limitations due to the deficits listed below (see PT Problem List).  Pt will benefit from skilled PT to increase their independence and safety with mobility to allow discharge to the venue listed below.       Follow Up Recommendations Home health PT;Supervision - Intermittent    Equipment Recommendations  None recommended by PT    Recommendations for Other Services       Precautions / Restrictions Precautions Precautions: Fall Restrictions Weight Bearing Restrictions: No      Mobility  Bed Mobility Overal bed mobility: Independent                Transfers Overall transfer level: Needs assistance Equipment used: None Transfers: Sit to/from Stand;Stand Pivot Transfers Sit to Stand: Supervision Stand pivot transfers: Supervision       General transfer comment: supervision for safety, no physical assist  Ambulation/Gait Ambulation/Gait assistance: Min guard Ambulation Distance (Feet): 180 Feet Assistive device: None Gait Pattern/deviations: Step-through pattern;Decreased stride length Gait velocity: WFL Gait velocity interpretation: at or above normal speed for age/gender General  Gait Details: O2 sats prior to ambulation and immediately following ambulation were 93% on RA.  Stairs            Wheelchair Mobility    Modified Rankin (Stroke Patients Only)       Balance Overall balance assessment: Needs assistance Sitting-balance support: Feet supported;No upper extremity supported Sitting balance-Leahy Scale: Good     Standing balance support: No upper extremity supported;During functional activity Standing balance-Leahy Scale: Fair                               Pertinent Vitals/Pain Pain Assessment: No/denies pain    Home Living Family/patient expects to be discharged to:: Private residence Living Arrangements: Alone Available Help at Discharge: Friend(s);Available PRN/intermittently Type of Home: House Home Access: Stairs to enter Entrance Stairs-Rails: None Entrance Stairs-Number of Steps: 1 Home Layout: One level Home Equipment: Walker - 2 wheels;Wheelchair - manual;Cane - single point;Shower seat;Toilet riser      Prior Function Level of Independence: Independent with assistive device(s)         Comments: states she usually ambulates around home without assistive device; has a RW to use at night if she needs it when getting up to the bathroom and has a single point cane that she keeps in her car.     Hand Dominance   Dominant Hand: Right    Extremity/Trunk Assessment   Upper Extremity Assessment: Defer to OT evaluation           Lower Extremity Assessment: Overall WFL for tasks assessed      Cervical / Trunk Assessment: Normal  Communication   Communication: No difficulties  Cognition Arousal/Alertness: Awake/alert  Behavior During Therapy: WFL for tasks assessed/performed Overall Cognitive Status: Within Functional Limits for tasks assessed                      General Comments      Exercises        Assessment/Plan    PT Assessment Patient needs continued PT services  PT Diagnosis  Difficulty walking   PT Problem List Decreased activity tolerance;Decreased balance;Decreased mobility;Decreased safety awareness;Cardiopulmonary status limiting activity  PT Treatment Interventions DME instruction;Gait training;Stair training;Functional mobility training;Therapeutic activities;Therapeutic exercise;Patient/family education;Balance training   PT Goals (Current goals can be found in the Care Plan section) Acute Rehab PT Goals Patient Stated Goal: home PT Goal Formulation: With patient Time For Goal Achievement: 08/01/14 Potential to Achieve Goals: Good    Frequency Min 3X/week   Barriers to discharge        Co-evaluation               End of Session Equipment Utilized During Treatment: Gait belt Activity Tolerance: Patient tolerated treatment well Patient left: in chair;with call bell/phone within reach Nurse Communication: Mobility status         Time: 1037-1050 PT Time Calculation (min) (ACUTE ONLY): 13 min   Charges:   PT Evaluation $Initial PT Evaluation Tier I: 1 Procedure     PT G CodesLorriane [name] 07/18/2014, 11:09 AM

## 2014-07-19 ENCOUNTER — Encounter (HOSPITAL_COMMUNITY): Payer: Self-pay | Admitting: *Deleted

## 2014-07-19 DIAGNOSIS — I5033 Acute on chronic diastolic (congestive) heart failure: Secondary | ICD-10-CM | POA: Insufficient documentation

## 2014-07-19 LAB — BASIC METABOLIC PANEL
ANION GAP: 11 (ref 5–15)
BUN: 83 mg/dL — ABNORMAL HIGH (ref 6–23)
CALCIUM: 8.1 mg/dL — AB (ref 8.4–10.5)
CO2: 22 mmol/L (ref 19–32)
Chloride: 99 mmol/L (ref 96–112)
Creatinine, Ser: 3.18 mg/dL — ABNORMAL HIGH (ref 0.50–1.10)
GFR, EST AFRICAN AMERICAN: 14 mL/min — AB (ref >90)
GFR, EST NON AFRICAN AMERICAN: 12 mL/min — AB (ref >90)
Glucose, Bld: 120 mg/dL — ABNORMAL HIGH (ref 70–99)
Potassium: 4.1 mmol/L (ref 3.5–5.1)
Sodium: 132 mmol/L — ABNORMAL LOW (ref 135–145)

## 2014-07-19 LAB — OSMOLALITY, URINE: OSMOLALITY UR: 406 mosm/kg (ref 390–1090)

## 2014-07-19 LAB — HEMOGLOBIN A1C
Hgb A1c MFr Bld: 5.5 % (ref 4.8–5.6)
MEAN PLASMA GLUCOSE: 111 mg/dL

## 2014-07-19 MED ORDER — CLONIDINE HCL 0.1 MG PO TABS
0.1000 mg | ORAL_TABLET | Freq: Three times a day (TID) | ORAL | Status: DC
  Administered 2014-07-19 – 2014-07-27 (×24): 0.1 mg via ORAL
  Filled 2014-07-19 (×29): qty 1

## 2014-07-19 MED ORDER — METOLAZONE 2.5 MG PO TABS
2.5000 mg | ORAL_TABLET | Freq: Every day | ORAL | Status: DC
  Filled 2014-07-19: qty 1

## 2014-07-19 MED ORDER — FUROSEMIDE 10 MG/ML IJ SOLN
60.0000 mg | Freq: Two times a day (BID) | INTRAMUSCULAR | Status: DC
  Administered 2014-07-19 (×2): 60 mg via INTRAVENOUS
  Filled 2014-07-19: qty 6

## 2014-07-19 MED ORDER — FUROSEMIDE 10 MG/ML IJ SOLN
80.0000 mg | Freq: Three times a day (TID) | INTRAMUSCULAR | Status: AC
  Administered 2014-07-20 – 2014-07-25 (×16): 80 mg via INTRAVENOUS
  Filled 2014-07-19 (×20): qty 8

## 2014-07-19 NOTE — Progress Notes (Signed)
Physical Therapy Treatment Patient Details Name: FLORENA KOZMA MRN: [id number] DOB: Nov 15, 1923 Today's Date: 07/19/2014    History of Present Illness Maricarmen Braziel is a 81 y.o. female, with a past medical history of renal artery stenosis, refractory hypertension, diastolic heart failure, TIA, aortic stenosis. She presents to the emergency department with shortness of breath that started on Sunday. The patient reports reduced exercise tolerance, trembling, and her family friend in the room states that her face is flushed pink which is abnormal for her. She also complains of a nonproductive cough. The patient complains of an unquenchable thirst despite drinking large amounts of cold water recently.    PT Comments    Pt is making good progress with mobility. Pt continues to have SOB with activity. O2 sats 92% on 3 liters, 87%-92% on RA with gait. Pt did complain of SOB. Pt does demonstrate some decreased balance. I do recommend using a RW initially at home until HHPT can reassess.   Follow Up Recommendations  Home health PT     Equipment Recommendations  None recommended by PT    Recommendations for Other Services       Precautions / Restrictions Precautions Precautions: Fall Restrictions Weight Bearing Restrictions: No    Mobility  Bed Mobility Overal bed mobility: Independent                Transfers Overall transfer level: Needs assistance Equipment used: None Transfers: Sit to/from Stand Sit to Stand: Supervision         General transfer comment: cues for hand placement and toincrease forward trunk flexion for initial rise up from the bed.  Ambulation/Gait Ambulation/Gait assistance: Min assist Ambulation Distance (Feet): 125 Feet Assistive device: None Gait Pattern/deviations: Step-through pattern;Decreased stride length Gait velocity: decreased   General Gait Details: Pt keeps weight shifted a little posteriorly creating a fall risk back. Feel with a  minimal perturbation she would fall. Walked a second time with a RW and pt was much safer. I would recommend using the RW initially since she lives alone.  O2 sats 87-92% on RA with gait.   Stairs            Wheelchair Mobility    Modified Rankin (Stroke Patients Only)       Balance Overall balance assessment: Needs assistance Sitting-balance support: Bilateral upper extremity supported Sitting balance-Leahy Scale: Good     Standing balance support: No upper extremity supported Standing balance-Leahy Scale: Fair                      Cognition Arousal/Alertness: Awake/alert Behavior During Therapy: WFL for tasks assessed/performed Overall Cognitive Status: Within Functional Limits for tasks assessed                      Exercises      General Comments        Pertinent Vitals/Pain Pain Assessment: No/denies pain    Home Living                      Prior Function            PT Goals (current goals can now be found in the care plan section) Progress towards PT goals: Progressing toward goals    Frequency  Min 3X/week    PT Plan Current plan remains appropriate    Co-evaluation             End of Session Equipment Utilized During  Treatment: Gait belt Activity Tolerance: Patient limited by fatigue Patient left: in chair;with call bell/phone within reach     Time: 3244-0102 PT Time Calculation (min) (ACUTE ONLY): 23 min  Charges:  $Gait Training: 23-37 mins                    G Codes:      Lelon Mast 07/19/2014, 12:20 PM

## 2014-07-19 NOTE — Care Management Note (Signed)
    Page 1 of 2   07/29/2014     1:15:05 PM CARE MANAGEMENT NOTE 07/29/2014  Patient:  Rachael [name]   Account Number:  [id number]  Date Initiated:  07/19/2014  Documentation initiated by:  AMERSON,JULIE  Subjective/Objective Assessment:   Pt adm on 07/17/14 with CHF.  PTA, pt lives alone and is independent.     Action/Plan:   Pt for likely dc over the weekend.  Will need HH follow up. She prefers HH with [place]; referral to [facility] with [facility] agency.  Start of care 24-48h post dc date.   Anticipated DC Date:  07/30/2014   Anticipated DC Plan:  SKILLED NURSING FACILITY  In-house referral  Clinical Social Worker      DC Forensic scientist  CM consult      [facility] Choice  HOME HEALTH   Choice offered to / List presented to:  C-1 Patient        [facility] arranged  HH-1 RN  [place] [facility]      [facility] agency  [place]   Status of service:  Completed, signed off Medicare Important Message given?  YES (If response is "NO", the following Medicare IM given date fields will be blank) Date Medicare IM given:  07/22/2014 Medicare IM given by:  [facility] Date Additional Medicare IM given:  07/29/2014 Additional Medicare IM given by:  Elissa Hefty  Discharge Disposition:  [place]  Per UR Regulation:  Reviewed for med. necessity/level of care/duration of stay  If discussed at [place] of Stay Meetings, dates discussed:   07/30/2014    Comments:  07/27/14 17:30 Cm met with pt in room and step-daughter Rachael [name].  This is third attempt to speak with pt but she is having persistent emesis and is unable to participate. Rachael [name] [phone] and I spokeof the options.  Rachael [name] states home is out of the question  because there is no one to take care of pt and she lives alone.  This environment would not be appropriate for home health or home hospice bc the family cannot afford full time caretaking.  Rachael [name] would like to speak with the CSW  alone and then together to present the plan of SNF/Residential Hospice to the patient. Rachael [name] states she believes her mother has a negative impression of SNF/Residential Hospice.  CM has placed referal to CSW.  No other CM needs were communicated. Rachael [name], BSN, Cm [phone].  07/22/2014 1130 NCM spoke to pt and states she still drives. Plans to pursue dialysis 3 days per week. Notified [facility] orders in system for home. Waiting final dc disposition. NCM will continue to follow up for dc needs. Will follow up on oxygen needs for home.  Rachael Finner RN CCM Case Mgmt phone [phone]   friend Rachael [name] [phone] nephew  Rachael [name] [phone]

## 2014-07-19 NOTE — Consult Note (Signed)
81 yo with history of CKD, HTN, PAD including dopplers recently suggestive of "> 60 % right renal artery stenosis", < 60% left renal artery stenosis, significant atherosclerosis of the distal aorta, and chronic diastolic CHF.   She was admitted with dyspnea and AKI. Recently, she has had very high and difficult to control blood pressure. She has been on Lasix 80 mg bid at home and has had trouble with diastolic CHF exacerbations over the last year.Within the last several weeks she was started on minoxidil.   Baseline creatinine is about 1.7.  Results for Rachael [name], Rachael [name] (MRN [id number]) as of 07/19/2014 20:24  Ref. Range 12/28/2013 13:45 01/21/2014 10:54 02/06/2014 10:26 03/01/2014 10:43 05/02/2014 12:20 06/26/2014 10:54 07/04/2014 12:17 07/17/2014 12:35 07/18/2014 04:11 07/19/2014 04:55  Creatinine Latest Range: 0.50-1.10 mg/dL 1.7 (H) 1.7 (H) 1.5 (H) 1.58 (H) 1.77 (H) 2.29 (H) 2.01 (H) 3.06 (H) 3.16 (H) 3.18 (H)    She was admitted on 2/24 with dyspnea and decreased urine output. Her creatinine was noted to be up to 3.1. Diuretics were stopped and she was given some IV fluid. Today, she has been noted to be more short of breath. Her blood pressure seems to be running [id number] systolic. She was thought to be volume overloaded now and IV fluid was stopped and Lasix IV was restarted. Renal was asked to evaluate.Cardiology was giving consideration to doing renal artery angiography.   Creatinine remains elevated at 3.1   Duplex scan Impressions from 06/19/14 Small caliber abdominal aorta. Aorto-iliac atherosclerosis. >50% distal aorta stenosis. >70% Celiac axis and SMA stenosis. >60% right renal artery stenosis. 1-59% left renal artery stenosis. The IVC and renal veins are patent.      Past Medical History  Diagnosis Date  . Vitamin D deficiency   . Diverticulosis   . Coronary artery calcification seen on CAT scan   . TIA (transient ischemic attack)     Carotid dopplers (8/11): 40-50% RICA  stenosis. Carotid dopplers (12/13) with 40-59% bilateral ICA stenoses.   . CKD (chronic kidney disease)   . Inguinal hernia   . Broken hip     bilateral  . Hypertension   . Hyperlipidemia   . Hypothyroidism   . Senile osteoporosis   . Osteopenia   . Elevated hemoglobin A1c   . Bradycardia     Holter monitor (8/11) with no significant bradycardia, frequent PACs, few short runs of slow atrial tachycardia.   . Chronic diastolic heart failure     a. Echo (6/15):  EF 60-65%, Gr 2 DD, mild AS (mean 8 mmHg);  b.  Echo (11/2013):  mild LVH, EF 55-60%, no RWMA, Gr 1 DD, mild AS (peak 19 mmHg), mild MR, trivial eff   . Aortic stenosis     a. mild (echo 6/15 with mean gradient 8 mmHg)  . Carotid stenosis     a. Carotid US (04/2012):  bilateral ICA 40-59%;  b.  Carotid US (8/15): Bilateral ICA 40-59% - follow up 1 year    Past Surgical History  Procedure Laterality Date  . Inguinal hernia repair    . Tonsillectomy    . Hip arthroplasty  04/01/2012    Procedure: ARTHROPLASTY BIPOLAR HIP;  Surgeon: Kerin Salen, MD;  Location: [facility];  Service: Orthopedics;  Laterality: Left;  . Compression hip screw  05/06/2012    Procedure: COMPRESSION HIP;  Surgeon: Jolyn Nap, MD;  Location: [place];  Service: Orthopedics;  Laterality: Right;  . Skin lesion excision  benignt; from shoulder  . Hip surgery      left  . Eye surgery      bilateral  . Eye surgery      tear duct R side  . Blepharoplasty  1983  . Melanoma excision  1991    Right Arm  . Total hip arthroplasty Right 2013  . Total hip arthroplasty Left 2013  . Esophagogastroduodenoscopy N/A 11/12/2013    Procedure: ESOPHAGOGASTRODUODENOSCOPY (EGD);  Surgeon: Jerene Bears, MD;  Location: [facility];  Service: Endoscopy;  Laterality: N/A;   Social History:  reports that she quit smoking about 43 years ago. Her smoking use included Cigarettes. She has never used smokeless tobacco. She reports that she does not drink alcohol or use  illicit drugs. Allergies:  Allergies  Allergen Reactions  . Aricept [Donepezil Hcl] Nausea And Vomiting    Nausea and vomiting  . Prevacid [Lansoprazole] Nausea And Vomiting    GI upset  . Tylenol [Acetaminophen] Nausea Only    Tylenol #3 - Nausea   Family History  Problem Relation Age of Onset  . CVA Mother     died age 86  . Emphysema Sister   . Colon cancer Neg Hx   . Other      patient does not know   Medications: Reviewed ROS: Noncontributory Blood pressure 137/54, pulse 62, temperature 97.5 F (36.4 C), temperature source Oral, resp. rate 18, height 5' 8.75" (1.746 m), weight 55.9 kg (123 lb 3.8 oz), SpO2 93 %.  General appearance: elderly female Head: ATNC , neck veins full, pos HJR Eyes: EOMI Resp: clear to ausc Chest wall: nontender Cardio: RRR GI: soft, mild RUQ tenderness Extremities: 1+ edema Skin: ok Neurologic: nf Results for orders placed or performed during the hospital encounter of 07/17/14 (from the past 48 hour(s))  TSH     Status: None   Collection Time: 07/17/14  8:44 PM  Result Value Ref Range   TSH 3.109 0.350 - 4.500 uIU/mL  Hemoglobin A1c     Status: None   Collection Time: 07/17/14  8:44 PM  Result Value Ref Range   Hgb A1c MFr Bld 5.5 4.8 - 5.6 %    Comment: (NOTE)         Pre-diabetes: 5.7 - 6.4         Diabetes: >6.4         Glycemic control for adults with diabetes: <7.0    Mean Plasma Glucose 111 mg/dL    Comment: (NOTE) Performed At: [facility] [place], [place] [id number] Lindon Romp MD QK:[id number]   Basic metabolic panel     Status: Abnormal   Collection Time: 07/18/14  4:11 AM  Result Value Ref Range   Sodium 136 135 - 145 mmol/L   Potassium 4.4 3.5 - 5.1 mmol/L   Chloride 99 96 - 112 mmol/L   CO2 21 19 - 32 mmol/L   Glucose, Bld 121 (H) 70 - 99 mg/dL   BUN 79 (H) 6 - 23 mg/dL   Creatinine, Ser 3.16 (H) 0.50 - 1.10 mg/dL   Calcium 8.6 8.4 - 10.5 mg/dL   GFR calc non Af Amer 12 (L) >90  mL/min   GFR calc Af Amer 14 (L) >90 mL/min    Comment: (NOTE) The eGFR has been calculated using the CKD EPI equation. This calculation has not been validated in all clinical situations. eGFR's persistently <90 mL/min signify possible Chronic Kidney Disease.    Anion gap 16 (H)  5 - 15  CBC     Status: Abnormal   Collection Time: 07/18/14  4:11 AM  Result Value Ref Range   WBC 9.1 4.0 - 10.5 K/uL   RBC 3.14 (L) 3.87 - 5.11 MIL/uL   Hemoglobin 8.9 (L) 12.0 - 15.0 g/dL   HCT 27.3 (L) 36.0 - 46.0 %   MCV 86.9 78.0 - 100.0 fL   MCH 28.3 26.0 - 34.0 pg   MCHC 32.6 30.0 - 36.0 g/dL   RDW 14.5 11.5 - 15.5 %   Platelets 272 150 - 400 K/uL  Osmolality     Status: Abnormal   Collection Time: 07/18/14  8:45 AM  Result Value Ref Range   Osmolality 309 (H) 275 - 300 mOsm/kg    Comment: Performed at [facility]  Sodium, urine, random     Status: None   Collection Time: 07/18/14  6:46 PM  Result Value Ref Range   Sodium, Ur <10 mmol/L    Comment: REPEATED TO VERIFY  Osmolality, urine     Status: None   Collection Time: 07/18/14  6:46 PM  Result Value Ref Range   Osmolality, Ur 406 390 - 1090 mOsm/kg    Comment: Performed at [facility]  Creatinine, urine, random     Status: None   Collection Time: 07/18/14  6:46 PM  Result Value Ref Range   Creatinine, Urine 87.57 mg/dL  Basic metabolic panel     Status: Abnormal   Collection Time: 07/19/14  4:55 AM  Result Value Ref Range   Sodium 132 (L) 135 - 145 mmol/L   Potassium 4.1 3.5 - 5.1 mmol/L   Chloride 99 96 - 112 mmol/L   CO2 22 19 - 32 mmol/L   Glucose, Bld 120 (H) 70 - 99 mg/dL   BUN 83 (H) 6 - 23 mg/dL   Creatinine, Ser 3.18 (H) 0.50 - 1.10 mg/dL   Calcium 8.1 (L) 8.4 - 10.5 mg/dL   GFR calc non Af Amer 12 (L) >90 mL/min   GFR calc Af Amer 14 (L) >90 mL/min    Comment: (NOTE) The eGFR has been calculated using the CKD EPI equation. This calculation has not been validated in all clinical situations. eGFR's  persistently <90 mL/min signify possible Chronic Kidney Disease.    Anion gap 11 5 - 15   No results found.  Assessment:  1 AKI prob due to CHF, perhaps exacerbated by RAS 2 CKD 3 CHF 4 Doppler suggestive of RAS 5 Hypertension, controlled in hospital Plan: 1 Rx CHF with usual methods to optimize CO and RBF; I will increase diuretics 2 I would not be aggressive about a renal artery investigation & intervention given potential risk of serious complications in this elderly pt with severe atherosclerosis 3 I am concerned about sodium retentive properties of minoxidil and coincident CHF symptoms after starting.   Walter Min C 07/19/2014, 8:33 PM

## 2014-07-19 NOTE — Progress Notes (Signed)
Occupational Therapy Treatment Patient Details Name: Rachael [name] MRN: [id number] DOB: 07-05-1923 Today's Date: 07/19/2014    History of present illness Rachael [name] is a 81 y.o. female, with a past medical history of renal artery stenosis, refractory hypertension, diastolic heart failure, TIA, aortic stenosis. She presents to the emergency department with shortness of breath that started on Sunday. The patient reports reduced exercise tolerance, trembling, and her family friend in the room states that her face is flushed pink which is abnormal for her. She also complains of a nonproductive cough. The patient complains of an unquenchable thirst despite drinking large amounts of cold water recently.   OT comments  Pt making steady progress. Desats with funcitonal activity to 85 on RA. O2 increases to 90 after 2 min in sitting - RA. O2 at 2L. Pt @ 92. Continue to recommend [place] after D/C.   Follow Up Recommendations  Home health OT    Equipment Recommendations  Other (comment) (rollator)    Recommendations for Other Services      Precautions / Restrictions Precautions Precautions: Fall Precaution Comments: desats on RA       Mobility Bed Mobility                  Transfers Overall transfer level: Needs assistance Equipment used: 1 person hand held assist Transfers: Sit to/from Stand Sit to Stand: Supervision Stand pivot transfers: Supervision            Balance Overall balance assessment: Needs assistance         Standing balance support: No upper extremity supported;During functional activity Standing balance-Leahy Scale: Fair                     ADL                                         General ADL Comments: Educated pt on energy conservation techniques and discussed home set up options. Recommended pt use her 3 in 1 in the shower as a chair and need to have grab bars installed. Discussed possibility of using a rollator,  especially to hold O2 tank if pt goes out in community and to reduce risk of falls by having seat in rollator. During funcitonal mobility, pt at risk for falls when not using RW. Educated pt on need to use RW initially for all mobility. Pt desats to 85 with funcitonal activity on RA. rebounds to 90 after 2 min pursed lip breathing. Pt states she will most likely hire a caregiver to assist with meal prep, etc.                                      Cognition   Behavior During Therapy: [facility] for tasks assessed/performed Overall Cognitive Status: Within Functional Limits for tasks assessed                                                 General Comments      Pertinent Vitals/ Pain       Pain Assessment: No/denies pain  Frequency Min 2X/week     Progress Toward Goals  OT Goals(current goals can now be found in the care plan section)  Progress towards OT goals: Progressing toward goals  Acute Rehab OT Goals Patient Stated Goal: home OT Goal Formulation: With patient Time For Goal Achievement: 07/25/14 Potential to Achieve Goals: Good ADL Goals Pt Will Transfer to Toilet: with modified independence;ambulating;regular height toilet;grab bars Pt Will Perform Toileting - Clothing Manipulation and hygiene: with modified independence;sit to/from stand Additional ADL Goal #1: Pt will independently use energy conservation stratgies during ADLs  Plan Discharge plan remains appropriate    Co-evaluation                 End of Session Equipment Utilized During Treatment: Gait belt;Oxygen   Activity Tolerance Patient tolerated treatment well   Patient Left in chair;with call bell/phone within reach   Nurse Communication Mobility status;Other (comment) (O2 sats)        Time: 1520-1550 OT Time Calculation (min): 30 min  Charges: OT General Charges $OT Visit: 1  Procedure OT Treatments $Self Care/Home Management : 23-37 mins  Rebacca Votaw,HILLARY 07/19/2014, 4:58 PM   [facility], OTR/L  [phone] 07/19/2014

## 2014-07-19 NOTE — Progress Notes (Signed)
Patient Demographics  Rachael [name], is a 81 y.o. female, DOB - 01-Oct-1923, CVE:[id number]  Admit date - 07/17/2014   Admitting Physician No admitting provider for patient encounter.  Outpatient Primary MD for the patient is Alesia Richards, MD  LOS - 2   Chief Complaint  Patient presents with  . Shortness of Breath        Subjective:   Rachael [name] today has, No headache, No chest pain, No abdominal pain - No Nausea, No new weakness tingling or numbness, No Cough - continues to have shortness of breath.  Assessment & Plan    1. Acute respiratory, fluid overload due to acute on chronic diastolic CHF EF 02% on recent echo. Does have evidence of fluid overload, stop IV fluids, IV Lasix adjusted, continue beta blocker. Cards called, Continue supportive care and monitor. Try to titrate off O2.   2. ARF and the kidney disease stage IV. Baseline creatinine around 2, diurese and monitor. Likely due to CHF decompensation. Avoid nephrotoxins.   3. Essential hypertension. Continue home medications unchanged at this point.   4. Hyperglycemia in the ER. Likely due to stress. Recent A1c was 5.6.     Code Status: Full  Family Communication: Cards  Disposition Plan: To be decided   Procedures    Consults  none   Medications  Scheduled Meds: . amLODipine  10 mg Oral Daily  . aspirin EC  81 mg Oral Daily  . atorvastatin  20 mg Oral Daily  . bisoprolol  10 mg Oral Daily  . cholecalciferol  1,000 Units Oral q morning - 10a  . cloNIDine  0.1 mg Oral TID  . docusate sodium  100 mg Oral BID  . furosemide  60 mg Intravenous BID  . heparin  5,000 Units Subcutaneous 3 times per day  . hydrALAZINE  100 mg Oral TID  . levothyroxine  175 mcg Oral QAC breakfast  . minoxidil  7.5 mg  Oral Daily  . multivitamin with minerals  1 tablet Oral q morning - 10a  . multivitamin-lutein  1 capsule Oral Daily  . pantoprazole  40 mg Oral BID AC  . polyvinyl alcohol  1 drop Both Eyes TID  . sodium chloride  1 drop Both Eyes QHS  . sodium chloride  3 mL Intravenous Q12H   Continuous Infusions:  PRN Meds:.acetaminophen **OR** acetaminophen, albuterol, hydrALAZINE, ondansetron **OR** ondansetron (ZOFRAN) IV  DVT Prophylaxis   Heparin  Lab Results  Component Value Date   PLT 272 07/18/2014    Antibiotics    Anti-infectives    None          Objective:   Filed Vitals:   07/18/14 1808 07/18/14 2100 07/19/14 0117 07/19/14 0522  BP: 138/38 130/33 125/37 155/50  Pulse: 63 59 61 60  Temp:  98.7 F (37.1 C) 97.3 F (36.3 C) 98 F (36.7 C)  TempSrc:  Oral Oral Oral  Resp:  18 18 18   Height:      Weight:    55.9 kg (123 lb 3.8 oz)  SpO2:  96% 91% 92%    Wt Readings from Last 3 Encounters:  07/19/14 55.9 kg (123 lb 3.8 oz)  07/15/14 55.611 kg (122 lb 9.6 oz)  07/11/14 55.883 kg (  123 lb 3.2 oz)     Intake/Output Summary (Last 24 hours) at 07/19/14 1051 Last data filed at 07/19/14 0800  Gross per 24 hour  Intake   1200 ml  Output    650 ml  Net    550 ml     Physical Exam  Awake Alert, Oriented X 3, No new F.N deficits, Normal affect [place].AT,PERRAL Supple Neck,No JVD, No cervical lymphadenopathy appriciated.  Symmetrical Chest wall movement, Good air movement bilaterally, rales RRR,No Gallops,Rubs or new Murmurs, No Parasternal Heave +ve B.Sounds, Abd Soft, No tenderness, No organomegaly appriciated, No rebound - guarding or rigidity. No Cyanosis, Clubbing , trace edema, No new Rash or bruise     Data Review   Micro Results Recent Results (from the past 240 hour(s))  Urine culture     Status: None   Collection Time: 07/17/14  6:49 PM  Result Value Ref Range Status   Specimen Description URINE, CLEAN CATCH  Final   Special Requests NONE  Final    Colony Count NO GROWTH Performed at [facility]   Final   Culture NO GROWTH Performed at [facility]   Final   Report Status 07/18/2014 FINAL  Final    Radiology Reports Dg Chest 2 View  07/17/2014   CLINICAL DATA:  Worsening shortness of breath over 2 days  EXAM: CHEST  2 VIEW  COMPARISON:  11/29/2013  FINDINGS: Cardiac shadow is within normal limits. Mild vascular congestion is seen. Small bilateral pleural effusions are noted but improved from the prior exam. Bibasilar scarring is seen. Bilateral nipple shadows are noted.  IMPRESSION: Small bilateral pleural effusions.  Mild vascular congestion.   Electronically Signed   By: [name] M.D.   On: 07/17/2014 13:49     CBC  Recent Labs Lab 07/17/14 1235 07/18/14 0411  WBC 10.5 9.1  HGB 10.3* 8.9*  HCT 30.5* 27.3*  PLT 300 272  MCV 86.2 86.9  MCH 29.1 28.3  MCHC 33.8 32.6  RDW 14.3 14.5  LYMPHSABS 0.5*  --   MONOABS 0.9  --   EOSABS 0.0  --   BASOSABS 0.0  --     Chemistries   Recent Labs Lab 07/17/14 1235 07/18/14 0411 07/19/14 0455  NA 133* 136 132*  K 4.5 4.4 4.1  CL 96 99 99  CO2 26 21 22   GLUCOSE 154* 121* 120*  BUN 78* 79* 83*  CREATININE 3.06* 3.16* 3.18*  CALCIUM 9.2 8.6 8.1*   ------------------------------------------------------------------------------------------------------------------ estimated creatinine clearance is 10.8 mL/min (by C-G formula based on Cr of 3.18). ------------------------------------------------------------------------------------------------------------------  Recent Labs  07/17/14 2044  HGBA1C 5.5   ------------------------------------------------------------------------------------------------------------------ No results for input(s): CHOL, HDL, LDLCALC, TRIG, CHOLHDL, LDLDIRECT in the last 72 hours. ------------------------------------------------------------------------------------------------------------------  Recent Labs  07/17/14 2044   TSH 3.109   ------------------------------------------------------------------------------------------------------------------ No results for input(s): VITAMINB12, FOLATE, FERRITIN, TIBC, IRON, RETICCTPCT in the last 72 hours.  Coagulation profile No results for input(s): INR, PROTIME in the last 168 hours.  No results for input(s): DDIMER in the last 72 hours.  Cardiac Enzymes No results for input(s): CKMB, TROPONINI, MYOGLOBIN in the last 168 hours.  Invalid input(s): CK ------------------------------------------------------------------------------------------------------------------ Invalid input(s): POCBNP     Time Spent in minutes   35   SINGH,PRASHANT K M.D on 07/19/2014 at 10:51 AM  Between 7am to 7pm - Pager - [phone]  After 7pm go to [email or web address] - password [facility]  [facility]   Office  [phone]

## 2014-07-19 NOTE — Consult Note (Signed)
CARDIOLOGY CONSULT NOTE  Patient ID: Rachael [name] MRN: [id number] DOB/AGE: 12/05/1923 81 y.o.  Admit date: 07/17/2014 Primary Physician: Melford Aase Primary Cardiologist: Aundra Dubin Reason for Consultation: CHF, AKI  HPI: 81 yo with history of CKD, renovascular HTN, PAD including bilateral renal artery stenosis, and chronic diastolic CHF was admitted with dyspnea and AKI.  Recently, she has been struggling with very high and difficult to control blood pressure.  Recent renal artery dopplers were suggestive of significant renal artery stenosis, and there was concern that this was contributing to her resistant HTN, her CKD, and her diastolic CHF.  She has been on Lasix 80 mg bid at home and has had trouble with diastolic CHF exacerbations over the last year.  Recently, she saw Dr Fletcher Anon for peripheral vascular evaluation.  We discussed the renal artery stenosis and were considering CO2 angiography with possible renal artery intervention to both improve BP and to preserve renal function.  Baseline creatinine is 1.7.  She had been seeing her PCP also over the last couple of weeks 3 times to work on BP med adjustments.  She had been started on minoxidil.   Patient was admitted on 2/24 with dyspnea and decreased urine output.  Her creatinine was noted to be up to 3.1.  Diuretics were stopped and she was given some IV fluid.  Today, she has been noted to be more short of breath.  Her blood pressure seems to be running [id number] systolic.  She was thought to be volume overloaded now and IV fluid was stopped and Lasix IV was restarted.  Cardiology was asked to evaluate.  Creatinine remains elevated at 3.1.    Past Medical History:  1. DIVERTICULOSIS  2. HYPOTHYROIDISM  3. HYPERTENSION: Suspect renovascular.   4. TIA: x 2, manifested by diplopia. Carotid dopplers (8/11): 40-50% RICA stenosis. Carotid dopplers (12/13) with 40-59% bilateral ICA stenoses. Carotids (8/15) with bilateral ICA 40-59%  stenoses.  5. Bradycardia: Mild. Holter monitor (8/11) with no significant bradycardia, frequent PACs, few short runs of slow atrial tachycardia.  6. Chronic diastolic CHF: Echo (0/17): EF 65%, mild LV hypertrophy, aortic sclerosis without significant stenosis, mild mitral regurgitation. Echo (12/13) with EF 65-70%, moderate TR, PA systolic pressure 55 mmHg, mild AS with mean gradient 17 mmHg. Echo (7/15) with EF 55-60%, mild LVH, mild AS, mild AI, mild MR.  7. Aortic stenosis: Mild on 7/15 echo.  8. Femur fractures in 11/13 and 12/13, had surgery for repair both times.  9. CKD: Suspect due in part to bilateral renal artery stenosis.  10. Upper GI bleed: 6/15, erosive esophagitis.  11. Syncope 12. PAD: 2/16 renal artery and aorto-iliac dopplers showed severe R renal artery stenosis, borderline significant left renal artery stenosis, >70% SMA stenosis, > 70% celiac stenosis, > 50% distal aorta stenosis.  Has been seen by Dr Fletcher Anon.   Family History:  No premature CAD. Mother with CVA in her 43s.   Social History:  Lives alone. Widowed. No children but has a nephew living in town. Nonsmoker.   ROS: All systems reviewed and negative except as per HPI.   Current Scheduled Meds: . amLODipine  10 mg Oral Daily  . aspirin EC  81 mg Oral Daily  . atorvastatin  20 mg Oral Daily  . bisoprolol  10 mg Oral Daily  . cholecalciferol  1,000 Units Oral q morning - 10a  . cloNIDine  0.1 mg Oral TID  . docusate sodium  100 mg Oral BID  . furosemide  60 mg Intravenous BID  . heparin  5,000 Units Subcutaneous 3 times per day  . hydrALAZINE  100 mg Oral TID  . levothyroxine  175 mcg Oral QAC breakfast  . minoxidil  7.5 mg Oral Daily  . multivitamin with minerals  1 tablet Oral q morning - 10a  . multivitamin-lutein  1 capsule Oral Daily  . pantoprazole  40 mg Oral BID AC  . polyvinyl alcohol  1 drop Both Eyes TID  . sodium chloride  1 drop Both Eyes QHS  . sodium chloride  3 mL Intravenous  Q12H   Continuous Infusions:  PRN Meds:.acetaminophen **OR** acetaminophen, albuterol, hydrALAZINE, ondansetron **OR** ondansetron (ZOFRAN) IV    Prescriptions prior to admission  Medication Sig Dispense Refill Last Dose  . allopurinol (ZYLOPRIM) 100 MG tablet Take 1 tablet (100 mg total) by mouth every morning. 90 tablet 1 07/17/2014 at Unknown time  . amLODipine (NORVASC) 5 MG tablet Take 1 tablet (5 mg total) by mouth daily. 30 tablet 6 07/17/2014 at Unknown time  . aspirin EC 81 MG tablet Take 1 tablet (81 mg total) by mouth daily.   07/16/2014 at Unknown time  . atorvastatin (LIPITOR) 20 MG tablet Take 1 tablet (20 mg total) by mouth daily. 30 tablet 11 07/17/2014 at Unknown time  . Biotin 1000 MCG tablet Take 1,000 mcg by mouth every morning.    07/16/2014 at Unknown time  . bisoprolol (ZEBETA) 10 MG tablet Take 1 tablet (10 mg total) by mouth daily. 30 tablet 3 07/17/2014 at 0800  . cholecalciferol (VITAMIN D) 1000 UNITS tablet Take 1,000 Units by mouth every morning.    07/16/2014 at Unknown time  . cloNIDine (CATAPRES) 0.2 MG tablet Take 1 tablet (0.2 mg total) by mouth 3 (three) times daily. 90 tablet 99 07/17/2014 at Unknown time  . furosemide (LASIX) 40 MG tablet Take 2 tablets (80 mg total) by mouth daily. 120 tablet 11 07/17/2014 at Unknown time  . hydrALAZINE (APRESOLINE) 100 MG tablet Take 1 tablet (100 mg total) by mouth 3 (three) times daily. 90 tablet 3 07/17/2014 at Unknown time  . levothyroxine (SYNTHROID, LEVOTHROID) 175 MCG tablet Take 175 mcg by mouth daily before breakfast. Pt taking one half tablet on Sun, Tue. Thur.,and Sat.   07/17/2014 at Unknown time  . minoxidil (LONITEN) 2.5 MG tablet Take 1 to 2 tablets daily for BP as directed (Patient taking differently: Take 7.5 mg by mouth daily. ) 60 tablet 1 07/17/2014 at Unknown time  . Multiple Vitamin (MULTIVITAMIN WITH MINERALS) TABS Take 1 tablet by mouth every morning.    07/16/2014 at Unknown time  . pantoprazole (PROTONIX) 40  MG tablet Take 1 tablet (40 mg total) by mouth 2 (two) times daily before a meal. 60 tablet 3 07/17/2014 at Unknown time  . potassium chloride SA (K-DUR,KLOR-CON) 20 MEQ tablet Take 1 tablet (20 mEq total) by mouth daily. (Patient taking differently: Take 20-40 mEq by mouth. Taking 20 meq alternating with 40 meq every other day.) 90 tablet 3 07/17/2014 at Unknown time  . Propylene Glycol (SYSTANE BALANCE OP) Apply 1-2 drops to eye 3 (three) times daily.   07/17/2014 at Unknown time  . sodium chloride (MURO 128) 5 % ophthalmic solution Place 1 drop into both eyes at bedtime.   07/16/2014 at Unknown time  . spironolactone (ALDACTONE) 25 MG tablet Take 1 tablet (25 mg total) by mouth daily. 30 tablet 3 07/17/2014 at Unknown time    Physical exam Blood pressure 155/50, pulse  60, temperature 98 F (36.7 C), temperature source Oral, resp. rate 18, height 5' 8.75" (1.746 m), weight 123 lb 3.8 oz (55.9 kg), SpO2 92 %. General: NAD Neck: JVP 12 cm, no thyromegaly or thyroid nodule.  Lungs: Crackles at bases bilaterally.  CV: Nondisplaced PMI.  Heart regular S1/S2, no S3/S4, 2/6 SEM RUSB.  1+ ankle edema.  No carotid bruit.  1+ PT pulses bilaterally.   Abdomen: Soft, nontender, no hepatosplenomegaly, no distention.  Skin: Intact without lesions or rashes.  Neurologic: Alert and oriented x 3.  Psych: Normal affect. Extremities: No clubbing or cyanosis.  HEENT: Normal.   Labs:   Lab Results  Component Value Date   WBC 9.1 07/18/2014   HGB 8.9* 07/18/2014   HCT 27.3* 07/18/2014   MCV 86.9 07/18/2014   PLT 272 07/18/2014    Recent Labs Lab 07/19/14 0455  NA 132*  K 4.1  CL 99  CO2 22  BUN 83*  CREATININE 3.18*  CALCIUM 8.1*  GLUCOSE 120*   Lab Results  Component Value Date   CKTOTAL 40 01/03/2010   CKMB 1.1 01/03/2010   TROPONINI <0.30 11/30/2013    Radiology: - CXR: Small bilateral effusions  EKG: NSR, LVH, septal Qs  ASSESSMENT AND PLAN:  81 yo with history of CKD, renovascular  HTN, PAD including bilateral renal artery stenosis, and chronic diastolic CHF was admitted with dyspnea and AKI.   1. AKI on CKD: Difficult situation.  Had been seeing PCP frequently to work on lowering her BP, which had been quite high and uncontrolled.  I suspect she has renovascular hypertension with severe right renal artery stenosis and borderline significant left renal artery stenosis.  She was admitted with rise in creatinine from 1.7 => 3.  I am concerned that we may be significantly impairing her GFR with aggressive BP lowering in the face of possible bilateral significant renal artery stenosis.  Unfortunately, she is also volume overloaded currently (Got IV fluid and Lasix held at admission) with creatinine still at 3.18.  This is a difficult situation.  I am afraid that diuresis and BP control are going to worsen her renal function but allowing BP to ride higher and avoiding aggressive diuresis will lead to worsening CHF.  If we could intervene to open her renal arteries, that may be ideal.  I have spoken with Dr Fletcher Anon who saw her for vascular consult recently.  He could try intervention on the renal arteries using CO2 angiography which would limit IV contrast, but she would still have to get some contrast.  Therefore, would like to see renal function closer to her baseline before attempting this.  I am going to go ahead and consult nephrology.  Dr Joelyn Oms has seen her in the past.  2. HTN: Poor control historically with possible renovascular HTN.  See discussion above about concern that aggressive BP control will worsen GFR with bilateral RAS.  Would aim to let SBP run around 150.  - For now, would cut back on clonidine to 0.1 tid (this had been her dose at home, not 0.2 tid).  - Can continue minoxidil, hydralazine, and amlodipine but would not add anything further right now, may need to cut back if SBP is running on the lower side.  3. Acute on chronic diastolic CHF:  This is difficult.  She is  clearly volume overloaded but renal function remains considerably worse than baseline.  Agree with Lasix 60 mg IV bid today but would not give metolazone (I discontinued  this order).  Follow creatinine closely.   Loralie Champagne 07/19/2014 10:56 AM

## 2014-07-20 DIAGNOSIS — R0602 Shortness of breath: Secondary | ICD-10-CM

## 2014-07-20 DIAGNOSIS — I150 Renovascular hypertension: Secondary | ICD-10-CM

## 2014-07-20 LAB — BASIC METABOLIC PANEL
Anion gap: 10 (ref 5–15)
BUN: 83 mg/dL — ABNORMAL HIGH (ref 6–23)
CO2: 22 mmol/L (ref 19–32)
Calcium: 8.2 mg/dL — ABNORMAL LOW (ref 8.4–10.5)
Chloride: 101 mmol/L (ref 96–112)
Creatinine, Ser: 3.15 mg/dL — ABNORMAL HIGH (ref 0.50–1.10)
GFR calc Af Amer: 14 mL/min — ABNORMAL LOW (ref >90)
GFR calc non Af Amer: 12 mL/min — ABNORMAL LOW (ref >90)
Glucose, Bld: 109 mg/dL — ABNORMAL HIGH (ref 70–99)
Potassium: 3.7 mmol/L (ref 3.5–5.1)
Sodium: 133 mmol/L — ABNORMAL LOW (ref 135–145)

## 2014-07-20 MED ORDER — SALINE SPRAY 0.65 % NA SOLN
1.0000 | NASAL | Status: DC | PRN
  Administered 2014-07-20 – 2014-07-22 (×2): 1 via NASAL
  Filled 2014-07-20 (×2): qty 44

## 2014-07-20 NOTE — Progress Notes (Signed)
Patient Demographics  Rachael [name], is a 81 y.o. female, DOB - 11-Nov-1923, ZOX:[id number]  Admit date - 07/17/2014   Admitting Physician No admitting provider for patient encounter.  Outpatient Primary MD for the patient is Rachael Richards, MD  LOS - 3   Chief Complaint  Patient presents with  . Shortness of Breath        Subjective:   Rachael [name] today has, No headache, No chest pain, No abdominal pain - No Nausea, No new weakness tingling or numbness, No Cough - continues to have shortness of breath.  Assessment & Plan    1. Acute respiratory, fluid overload due to acute on chronic diastolic CHF EF 81% on recent echo. Does have evidence of fluid overload, stopped IV fluids, IV Lasix and Zaroxolyn adjusted, continue beta blocker. Cards following, Continue supportive care and monitor. Try to titrate off O2.  Filed Weights   07/18/14 0519 07/19/14 0522 07/20/14 0516  Weight: 55.5 kg (122 lb 5.7 oz) 55.9 kg (123 lb 3.8 oz) 55.203 kg (121 lb 11.2 oz)     2. ARF and the kidney disease stage IV. Baseline creatinine around 2, diurese and monitor. Likely due to CHF decompensation. Avoid nephrotoxins. Renal following.   3. Essential hypertension. Continue home medications unchanged at this point.   4. Hyperglycemia in the ER. Likely due to stress. Recent A1c was 5.6.     Code Status: Full  Family Communication: Cards, renal  Disposition Plan: To be decided   Procedures    Consults  none   Medications  Scheduled Meds: . amLODipine  10 mg Oral Daily  . aspirin EC  81 mg Oral Daily  . atorvastatin  20 mg Oral Daily  . bisoprolol  10 mg Oral Daily  . cholecalciferol  1,000 Units Oral q morning - 10a  . cloNIDine  0.1 mg Oral TID  . docusate sodium  100 mg Oral BID   . furosemide  80 mg Intravenous TID  . heparin  5,000 Units Subcutaneous 3 times per day  . hydrALAZINE  100 mg Oral TID  . levothyroxine  175 mcg Oral QAC breakfast  . minoxidil  7.5 mg Oral Daily  . multivitamin with minerals  1 tablet Oral q morning - 10a  . multivitamin-lutein  1 capsule Oral Daily  . pantoprazole  40 mg Oral BID AC  . polyvinyl alcohol  1 drop Both Eyes TID  . sodium chloride  1 drop Both Eyes QHS  . sodium chloride  3 mL Intravenous Q12H   Continuous Infusions:  PRN Meds:.acetaminophen **OR** acetaminophen, albuterol, hydrALAZINE, ondansetron **OR** ondansetron (ZOFRAN) IV, sodium chloride  DVT Prophylaxis   Heparin  Lab Results  Component Value Date   PLT 272 07/18/2014    Antibiotics    Anti-infectives    None          Objective:   Filed Vitals:   07/19/14 1352 07/19/14 1953 07/20/14 0516 07/20/14 1026  BP: 125/37 137/54 154/58 160/93  Pulse: 53 62 66   Temp: 97.3 F (36.3 C) 97.5 F (36.4 C) 97.6 F (36.4 C)   TempSrc: Oral Oral Oral   Resp: 18 18 18    Height:      Weight:   55.203 kg (  121 lb 11.2 oz)   SpO2: 94% 93% 95%     Wt Readings from Last 3 Encounters:  07/20/14 55.203 kg (121 lb 11.2 oz)  07/15/14 55.611 kg (122 lb 9.6 oz)  07/11/14 55.883 kg (123 lb 3.2 oz)     Intake/Output Summary (Last 24 hours) at 07/20/14 1156 Last data filed at 07/20/14 1000  Gross per 24 hour  Intake    663 ml  Output    625 ml  Net     38 ml     Physical Exam  Awake Alert, Oriented X 3, No new F.N deficits, Normal affect .AT,PERRAL Supple Neck,No JVD, No cervical lymphadenopathy appriciated.  Symmetrical Chest wall movement, Good air movement bilaterally, rales RRR,No Gallops,Rubs or new Murmurs, No Parasternal Heave +ve B.Sounds, Abd Soft, No tenderness, No organomegaly appriciated, No rebound - guarding or rigidity. No Cyanosis, Clubbing , trace edema, No new Rash or bruise     Data Review   Micro Results Recent Results  (from the past 240 hour(s))  Urine culture     Status: None   Collection Time: 07/17/14  6:49 PM  Result Value Ref Range Status   Specimen Description URINE, CLEAN CATCH  Final   Special Requests NONE  Final   Colony Count NO GROWTH Performed at [facility]   Final   Culture NO GROWTH Performed at [facility]   Final   Report Status 07/18/2014 FINAL  Final    Radiology Reports Dg Chest 2 View  07/17/2014   CLINICAL DATA:  Worsening shortness of breath over 2 days  EXAM: CHEST  2 VIEW  COMPARISON:  11/29/2013  FINDINGS: Cardiac shadow is within normal limits. Mild vascular congestion is seen. Small bilateral pleural effusions are noted but improved from the prior exam. Bibasilar scarring is seen. Bilateral nipple shadows are noted.  IMPRESSION: Small bilateral pleural effusions.  Mild vascular congestion.   Electronically Signed   By: [name] M.D.   On: 07/17/2014 13:49     CBC  Recent Labs Lab 07/17/14 1235 07/18/14 0411  WBC 10.5 9.1  HGB 10.3* 8.9*  HCT 30.5* 27.3*  PLT 300 272  MCV 86.2 86.9  MCH 29.1 28.3  MCHC 33.8 32.6  RDW 14.3 14.5  LYMPHSABS 0.5*  --   MONOABS 0.9  --   EOSABS 0.0  --   BASOSABS 0.0  --     Chemistries   Recent Labs Lab 07/17/14 1235 07/18/14 0411 07/19/14 0455 07/20/14 0320  NA 133* 136 132* 133*  K 4.5 4.4 4.1 3.7  CL 96 99 99 101  CO2 26 21 22 22   GLUCOSE 154* 121* 120* 109*  BUN 78* 79* 83* 83*  CREATININE 3.06* 3.16* 3.18* 3.15*  CALCIUM 9.2 8.6 8.1* 8.2*   ------------------------------------------------------------------------------------------------------------------ estimated creatinine clearance is 10.8 mL/min (by C-G formula based on Cr of 3.15). ------------------------------------------------------------------------------------------------------------------  Recent Labs  07/17/14 2044  HGBA1C 5.5    ------------------------------------------------------------------------------------------------------------------ No results for input(s): CHOL, HDL, LDLCALC, TRIG, CHOLHDL, LDLDIRECT in the last 72 hours. ------------------------------------------------------------------------------------------------------------------  Recent Labs  07/17/14 2044  TSH 3.109   ------------------------------------------------------------------------------------------------------------------ No results for input(s): VITAMINB12, FOLATE, FERRITIN, TIBC, IRON, RETICCTPCT in the last 72 hours.  Coagulation profile No results for input(s): INR, PROTIME in the last 168 hours.  No results for input(s): DDIMER in the last 72 hours.  Cardiac Enzymes No results for input(s): CKMB, TROPONINI, MYOGLOBIN in the last 168 hours.  Invalid input(s): CK ------------------------------------------------------------------------------------------------------------------ Invalid input(s): POCBNP  Time Spent in minutes   35   SINGH,PRASHANT K M.D on 07/20/2014 at 11:56 AM  Between 7am to 7pm - Pager - [phone]  After 7pm go to [email or web address] - password [facility]  [facility]   Office  [phone]

## 2014-07-20 NOTE — Progress Notes (Signed)
Assessment:  1 AKI prob due to CHF, perhaps exacerbated by RAS 2 CKD 3 CHF 4 Doppler suggestive of RAS 5 Hypertension, controlled in hospital Plan: Cont diuretics  Subjective: Interval History: not voiding much  Objective: Vital signs in last 24 hours: Temp:  [97.3 F (36.3 C)-97.6 F (36.4 C)] 97.6 F (36.4 C) (02/27 0516) Pulse Rate:  [53-66] 66 (02/27 0516) Resp:  [18] 18 (02/27 0516) BP: (125-160)/(37-93) 160/93 mmHg (02/27 1026) SpO2:  [92 %-95 %] 95 % (02/27 0516) Weight:  [55.203 kg (121 lb 11.2 oz)] 55.203 kg (121 lb 11.2 oz) (02/27 0516) Weight change: -0.697 kg (-1 lb 8.6 oz)  Intake/Output from previous day: 02/26 0701 - 02/27 0700 In: 643 [P.O.:640; I.V.:3] Out: 876 [Urine:875; Stool:1] Intake/Output this shift: Total I/O In: 120 [P.O.:120] Out: 150 [Urine:150]  General appearance: alert and cooperative Resp: diminished breath sounds bilaterally Chest wall: no tenderness Cardio: regular rate and rhythm, S1, S2 normal, no murmur, click, rub or gallop Extremities: edema 1+  Abd tender liuver Pos HJR  Lab Results:  Recent Labs  07/17/14 1235 07/18/14 0411  WBC 10.5 9.1  HGB 10.3* 8.9*  HCT 30.5* 27.3*  PLT 300 272   BMET:  Recent Labs  07/19/14 0455 07/20/14 0320  NA 132* 133*  K 4.1 3.7  CL 99 101  CO2 22 22  GLUCOSE 120* 109*  BUN 83* 83*  CREATININE 3.18* 3.15*  CALCIUM 8.1* 8.2*   No results for input(s): PTH in the last 72 hours. Iron Studies: No results for input(s): IRON, TIBC, TRANSFERRIN, FERRITIN in the last 72 hours. Studies/Results: No results found.  Scheduled: . amLODipine  10 mg Oral Daily  . aspirin EC  81 mg Oral Daily  . atorvastatin  20 mg Oral Daily  . bisoprolol  10 mg Oral Daily  . cholecalciferol  1,000 Units Oral q morning - 10a  . cloNIDine  0.1 mg Oral TID  . docusate sodium  100 mg Oral BID  . furosemide  80 mg Intravenous TID  . heparin  5,000 Units Subcutaneous 3 times per day  . hydrALAZINE  100  mg Oral TID  . levothyroxine  175 mcg Oral QAC breakfast  . minoxidil  7.5 mg Oral Daily  . multivitamin with minerals  1 tablet Oral q morning - 10a  . multivitamin-lutein  1 capsule Oral Daily  . pantoprazole  40 mg Oral BID AC  . polyvinyl alcohol  1 drop Both Eyes TID  . sodium chloride  1 drop Both Eyes QHS  . sodium chloride  3 mL Intravenous Q12H      LOS: 3 days   Kimberla Driskill C 07/20/2014,11:37 AM

## 2014-07-20 NOTE — Plan of Care (Signed)
Problem: Phase I Progression Outcomes Goal: EF % per last Echo/documented,Core Reminder form on chart Outcome: Completed/Met Date Met:  07/20/14 EF 55-60%(11-30-13)

## 2014-07-20 NOTE — Progress Notes (Signed)
SUBJECTIVE: She is making some urine.  SOB remains.  At this time, she denies chest pain or any new concerns.  Marland Kitchen amLODipine  10 mg Oral Daily  . aspirin EC  81 mg Oral Daily  . atorvastatin  20 mg Oral Daily  . bisoprolol  10 mg Oral Daily  . cholecalciferol  1,000 Units Oral q morning - 10a  . cloNIDine  0.1 mg Oral TID  . docusate sodium  100 mg Oral BID  . furosemide  80 mg Intravenous TID  . heparin  5,000 Units Subcutaneous 3 times per day  . hydrALAZINE  100 mg Oral TID  . levothyroxine  175 mcg Oral QAC breakfast  . minoxidil  7.5 mg Oral Daily  . multivitamin with minerals  1 tablet Oral q morning - 10a  . multivitamin-lutein  1 capsule Oral Daily  . pantoprazole  40 mg Oral BID AC  . polyvinyl alcohol  1 drop Both Eyes TID  . sodium chloride  1 drop Both Eyes QHS  . sodium chloride  3 mL Intravenous Q12H      OBJECTIVE: Physical Exam: Filed Vitals:   07/19/14 1352 07/19/14 1953 07/20/14 0516 07/20/14 1026  BP: 125/37 137/54 154/58 160/93  Pulse: 53 62 66   Temp: 97.3 F (36.3 C) 97.5 F (36.4 C) 97.6 F (36.4 C)   TempSrc: Oral Oral Oral   Resp: 18 18 18    Height:      Weight:   121 lb 11.2 oz (55.203 kg)   SpO2: 94% 93% 95%     Intake/Output Summary (Last 24 hours) at 07/20/14 1114 Last data filed at 07/20/14 1000  Gross per 24 hour  Intake    663 ml  Output    826 ml  Net   -163 ml    Telemetry reveals sinus rhythm  GEN- The patient is elderly appearing, alert and oriented x 3 today.   Head- normocephalic, atraumatic Eyes-  Sclera clear, conjunctiva pink Ears- hearing intact Nose bleed this am Oropharynx- clear Neck- supple  Lungs- normal work of breathing, decreased BS at the bases Heart- Regular rate and rhythm  GI- soft, NT, ND, + BS Extremities- no clubbing, cyanosis, or edema Skin- no rash or lesion Psych- euthymic mood, full affect Neuro- strength and sensation are intact  LABS: Basic Metabolic Panel:  Recent Labs   07/19/14 0455 07/20/14 0320  NA 132* 133*  K 4.1 3.7  CL 99 101  CO2 22 22  GLUCOSE 120* 109*  BUN 83* 83*  CREATININE 3.18* 3.15*  CALCIUM 8.1* 8.2*   Liver Function Tests: No results for input(s): AST, ALT, ALKPHOS, BILITOT, PROT, ALBUMIN in the last 72 hours. No results for input(s): LIPASE, AMYLASE in the last 72 hours. CBC:  Recent Labs  07/17/14 1235 07/18/14 0411  WBC 10.5 9.1  NEUTROABS 9.0*  --   HGB 10.3* 8.9*  HCT 30.5* 27.3*  MCV 86.2 86.9  PLT 300 272   Cardiac Enzymes: No results for input(s): CKTOTAL, CKMB, CKMBINDEX, TROPONINI in the last 72 hours. BNP: Invalid input(s): POCBNP D-Dimer: No results for input(s): DDIMER in the last 72 hours. Hemoglobin A1C:  Recent Labs  07/17/14 2044  HGBA1C 5.5   Fasting Lipid Panel: No results for input(s): CHOL, HDL, LDLCALC, TRIG, CHOLHDL, LDLDIRECT in the last 72 hours. Thyroid Function Tests:  Recent Labs  07/17/14 2044  TSH 3.109   Anemia Panel: No results for input(s): VITAMINB12, FOLATE, FERRITIN, TIBC, IRON, RETICCTPCT in the  last 72 hours.  RADIOLOGY: Dg Chest 2 View  07/17/2014   CLINICAL DATA:  Worsening shortness of breath over 2 days  EXAM: CHEST  2 VIEW  COMPARISON:  11/29/2013  FINDINGS: Cardiac shadow is within normal limits. Mild vascular congestion is seen. Small bilateral pleural effusions are noted but improved from the prior exam. Bibasilar scarring is seen. Bilateral nipple shadows are noted.  IMPRESSION: Small bilateral pleural effusions.  Mild vascular congestion.   Electronically Signed   By: [name] M.D.   On: 07/17/2014 13:49    ASSESSMENT AND PLAN:  Principal Problem:   Acute renal failure Active Problems:   CKD (chronic kidney disease), stage III   Hypertension   Hypothyroidism   Renal artery stenosis   CHF (congestive heart failure)   Acute renal failure superimposed on stage 3 chronic kidney disease   Acute on chronic diastolic congestive heart failure  1. SOB-  multifactoral Seems out of proportion to volume overload May have to consider other causes if not improved  2. Acute on chronic diastolic dysfunction Limited by renal function BP is stable  3. Acute on chronic renal failure I agree with Dr Florene Glen and appreciate his input I think that our options are limited  4. HTN Presently stable Consider stopping minoxidil if able  Her overall prognosis is quite poor Ultimately, palliative measures may be required  Thompson Grayer, MD 07/20/2014 11:14 AM

## 2014-07-21 LAB — RENAL FUNCTION PANEL
Albumin: 3.3 g/dL — ABNORMAL LOW (ref 3.5–5.2)
Anion gap: 11 (ref 5–15)
BUN: 83 mg/dL — AB (ref 6–23)
CO2: 25 mmol/L (ref 19–32)
Calcium: 8.7 mg/dL (ref 8.4–10.5)
Chloride: 100 mmol/L (ref 96–112)
Creatinine, Ser: 2.91 mg/dL — ABNORMAL HIGH (ref 0.50–1.10)
GFR calc Af Amer: 16 mL/min — ABNORMAL LOW (ref >90)
GFR, EST NON AFRICAN AMERICAN: 13 mL/min — AB (ref >90)
Glucose, Bld: 120 mg/dL — ABNORMAL HIGH (ref 70–99)
PHOSPHORUS: 5.6 mg/dL — AB (ref 2.3–4.6)
POTASSIUM: 3.7 mmol/L (ref 3.5–5.1)
Sodium: 136 mmol/L (ref 135–145)

## 2014-07-21 NOTE — Progress Notes (Signed)
Patient Demographics  Rachael [name], is a 81 y.o. female, DOB - 10-25-1923, DHR:[id number]  Admit date - 07/17/2014   Admitting Physician No admitting provider for patient encounter.  Outpatient Primary MD for the patient is Alesia Richards, MD  LOS - 4   Chief Complaint  Patient presents with  . Shortness of Breath        Subjective:   Rachael [name] today has, No headache, No chest pain, No abdominal pain - No Nausea, No new weakness tingling or numbness, No Cough - continues to have shortness of breath.  Assessment & Plan    1. Acute respiratory, fluid overload due to acute on chronic diastolic CHF EF 46% on recent echo. Does have evidence of fluid overload, stopped IV fluids, IV Lasix and Zaroxolyn adjusted, continue beta blocker. Cards following, Continue supportive care and monitor. Try to titrate off O2.  Filed Weights   07/19/14 0522 07/20/14 0516 07/21/14 0520  Weight: 55.9 kg (123 lb 3.8 oz) 55.203 kg (121 lb 11.2 oz) 54.4 kg (119 lb 14.9 oz)     2. ARF and the kidney disease stage IV. Baseline creatinine around 2, diurese and monitor. Likely due to CHF decompensation. Avoid nephrotoxins. Renal following.   3. Essential hypertension. Continue home medications unchanged at this point.   4. Hyperglycemia in the ER. Likely due to stress. Recent A1c was 5.6.     Code Status: Full  Family Communication: Cards, renal  Disposition Plan: To be decided   Procedures    Consults  none   Medications  Scheduled Meds: . amLODipine  10 mg Oral Daily  . aspirin EC  81 mg Oral Daily  . atorvastatin  20 mg Oral Daily  . bisoprolol  10 mg Oral Daily  . cholecalciferol  1,000 Units Oral q morning - 10a  . cloNIDine  0.1 mg Oral TID  . docusate sodium  100 mg Oral BID   . furosemide  80 mg Intravenous TID  . heparin  5,000 Units Subcutaneous 3 times per day  . hydrALAZINE  100 mg Oral TID  . levothyroxine  175 mcg Oral QAC breakfast  . multivitamin with minerals  1 tablet Oral q morning - 10a  . multivitamin-lutein  1 capsule Oral Daily  . pantoprazole  40 mg Oral BID AC  . polyvinyl alcohol  1 drop Both Eyes TID  . sodium chloride  1 drop Both Eyes QHS  . sodium chloride  3 mL Intravenous Q12H   Continuous Infusions:  PRN Meds:.acetaminophen **OR** acetaminophen, albuterol, hydrALAZINE, ondansetron **OR** ondansetron (ZOFRAN) IV, sodium chloride  DVT Prophylaxis   Heparin  Lab Results  Component Value Date   PLT 272 07/18/2014    Antibiotics    Anti-infectives    None          Objective:   Filed Vitals:   07/20/14 2022 07/21/14 0520 07/21/14 0700 07/21/14 1004  BP:  144/42  136/68  Pulse: 62 67    Temp:  97.5 F (36.4 C)    TempSrc:  Oral    Resp:  18    Height:      Weight:  54.4 kg (119 lb 14.9 oz)    SpO2:  96% 95%  Wt Readings from Last 3 Encounters:  07/21/14 54.4 kg (119 lb 14.9 oz)  07/15/14 55.611 kg (122 lb 9.6 oz)  07/11/14 55.883 kg (123 lb 3.2 oz)     Intake/Output Summary (Last 24 hours) at 07/21/14 1053 Last data filed at 07/21/14 0630  Gross per 24 hour  Intake    460 ml  Output   1400 ml  Net   -940 ml     Physical Exam  Awake Alert, Oriented X 3, No new F.N deficits, Normal affect [place].AT,PERRAL Supple Neck,No JVD, No cervical lymphadenopathy appriciated.  Symmetrical Chest wall movement, Good air movement bilaterally, rales RRR,No Gallops,Rubs or new Murmurs, No Parasternal Heave +ve B.Sounds, Abd Soft, No tenderness, No organomegaly appriciated, No rebound - guarding or rigidity. No Cyanosis, Clubbing , trace edema, No new Rash or bruise     Data Review   Micro Results Recent Results (from the past 240 hour(s))  Urine culture     Status: None   Collection Time: 07/17/14  6:49 PM   Result Value Ref Range Status   Specimen Description URINE, CLEAN CATCH  Final   Special Requests NONE  Final   Colony Count NO GROWTH Performed at [facility]   Final   Culture NO GROWTH Performed at [facility]   Final   Report Status 07/18/2014 FINAL  Final    Radiology Reports Dg Chest 2 View  07/17/2014   CLINICAL DATA:  Worsening shortness of breath over 2 days  EXAM: CHEST  2 VIEW  COMPARISON:  11/29/2013  FINDINGS: Cardiac shadow is within normal limits. Mild vascular congestion is seen. Small bilateral pleural effusions are noted but improved from the prior exam. Bibasilar scarring is seen. Bilateral nipple shadows are noted.  IMPRESSION: Small bilateral pleural effusions.  Mild vascular congestion.   Electronically Signed   By: [name] M.D.   On: 07/17/2014 13:49     CBC  Recent Labs Lab 07/17/14 1235 07/18/14 0411  WBC 10.5 9.1  HGB 10.3* 8.9*  HCT 30.5* 27.3*  PLT 300 272  MCV 86.2 86.9  MCH 29.1 28.3  MCHC 33.8 32.6  RDW 14.3 14.5  LYMPHSABS 0.5*  --   MONOABS 0.9  --   EOSABS 0.0  --   BASOSABS 0.0  --     Chemistries   Recent Labs Lab 07/17/14 1235 07/18/14 0411 07/19/14 0455 07/20/14 0320  NA 133* 136 132* 133*  K 4.5 4.4 4.1 3.7  CL 96 99 99 101  CO2 26 21 22 22   GLUCOSE 154* 121* 120* 109*  BUN 78* 79* 83* 83*  CREATININE 3.06* 3.16* 3.18* 3.15*  CALCIUM 9.2 8.6 8.1* 8.2*   ------------------------------------------------------------------------------------------------------------------ estimated creatinine clearance is 10.6 mL/min (by C-G formula based on Cr of 3.15). ------------------------------------------------------------------------------------------------------------------ No results for input(s): HGBA1C in the last 72 hours. ------------------------------------------------------------------------------------------------------------------ No results for input(s): CHOL, HDL, LDLCALC, TRIG, CHOLHDL, LDLDIRECT  in the last 72 hours. ------------------------------------------------------------------------------------------------------------------ No results for input(s): TSH, T4TOTAL, T3FREE, THYROIDAB in the last 72 hours.  Invalid input(s): FREET3 ------------------------------------------------------------------------------------------------------------------ No results for input(s): VITAMINB12, FOLATE, FERRITIN, TIBC, IRON, RETICCTPCT in the last 72 hours.  Coagulation profile No results for input(s): INR, PROTIME in the last 168 hours.  No results for input(s): DDIMER in the last 72 hours.  Cardiac Enzymes No results for input(s): CKMB, TROPONINI, MYOGLOBIN in the last 168 hours.  Invalid input(s): CK ------------------------------------------------------------------------------------------------------------------ Invalid input(s): POCBNP     Time Spent in minutes   35  Thurnell Lose M.D on 07/21/2014 at 10:53 AM  Between 7am to 7pm - Pager - [phone]  After 7pm go to [email or web address] - password [facility]  [facility]   Office  [phone]

## 2014-07-21 NOTE — Progress Notes (Signed)
Assessment:  1 AKI prob due to CHF, perhaps exacerbated by RAS 2 CKD 3 CHF 4 Doppler suggestive of RAS, very high risk for intervention due to severe atherosclerosis 5 Hypertension, controlled in hospital Plan: Cont diuretics (todays labs still pending at 1:15pm)  Subjective: Interval History: Still some SOB; 1500cc UOP  Objective: Vital signs in last 24 hours: Temp:  [97.5 F (36.4 [name])-98.3 F (36.8 [name])] 97.5 F (36.4 [name]) (02/28 0520) Pulse Rate:  [59-67] 67 (02/28 0520) Resp:  [18-20] 18 (02/28 0520) BP: (129-144)/(42-68) 136/68 mmHg (02/28 1004) SpO2:  [95 %-96 %] 95 % (02/28 0700) Weight:  [54.4 kg (119 lb 14.9 oz)] 54.4 kg (119 lb 14.9 oz) (02/28 0520) Weight change: -0.803 kg (-1 lb 12.3 oz)  Intake/Output from previous day: 02/27 0701 - 02/28 0700 In: 580 [P.O.:580] Out: 1550 [Urine:1550] Intake/Output this shift:    General appearance: alert and cooperative Back: negative, symmetric, no curvature. ROM normal. No CVA tenderness. Resp: diminished breath sounds bibasilar Cardio: regular rate and rhythm, S1, S2 normal, no murmur, click, rub or gallop GI: tender RUQ and pos HJR Extremities: edema 1+  Lab Results: No results for input(s): WBC, HGB, HCT, PLT in the last 72 hours. BMET:  Recent Labs  07/19/14 0455 07/20/14 0320  NA 132* 133*  K 4.1 3.7  CL 99 101  CO2 22 22  GLUCOSE 120* 109*  BUN 83* 83*  CREATININE 3.18* 3.15*  CALCIUM 8.1* 8.2*   No results for input(s): PTH in the last 72 hours. Iron Studies: No results for input(s): IRON, TIBC, TRANSFERRIN, FERRITIN in the last 72 hours. Studies/Results: No results found.  Scheduled: . amLODipine  10 mg Oral Daily  . aspirin EC  81 mg Oral Daily  . atorvastatin  20 mg Oral Daily  . bisoprolol  10 mg Oral Daily  . cholecalciferol  1,000 Units Oral q morning - 10a  . cloNIDine  0.1 mg Oral TID  . docusate sodium  100 mg Oral BID  . furosemide  80 mg Intravenous TID  . heparin  5,000 Units  Subcutaneous 3 times per day  . hydrALAZINE  100 mg Oral TID  . levothyroxine  175 mcg Oral QAC breakfast  . multivitamin with minerals  1 tablet Oral q morning - 10a  . multivitamin-lutein  1 capsule Oral Daily  . pantoprazole  40 mg Oral BID AC  . polyvinyl alcohol  1 drop Both Eyes TID  . sodium chloride  1 drop Both Eyes QHS  . sodium chloride  3 mL Intravenous Q12H     LOS: 4 days   Rachael [name] 07/21/2014,1:14 PM

## 2014-07-21 NOTE — Progress Notes (Signed)
SUBJECTIVE: She is making some urine.  SOB remains.  She is agrivated by her slow progress this morning.  At this time, she denies chest pain or any new concerns.  Marland Kitchen amLODipine  10 mg Oral Daily  . aspirin EC  81 mg Oral Daily  . atorvastatin  20 mg Oral Daily  . bisoprolol  10 mg Oral Daily  . cholecalciferol  1,000 Units Oral q morning - 10a  . cloNIDine  0.1 mg Oral TID  . docusate sodium  100 mg Oral BID  . furosemide  80 mg Intravenous TID  . heparin  5,000 Units Subcutaneous 3 times per day  . hydrALAZINE  100 mg Oral TID  . levothyroxine  175 mcg Oral QAC breakfast  . minoxidil  7.5 mg Oral Daily  . multivitamin with minerals  1 tablet Oral q morning - 10a  . multivitamin-lutein  1 capsule Oral Daily  . pantoprazole  40 mg Oral BID AC  . polyvinyl alcohol  1 drop Both Eyes TID  . sodium chloride  1 drop Both Eyes QHS  . sodium chloride  3 mL Intravenous Q12H      OBJECTIVE: Physical Exam: Filed Vitals:   07/20/14 2022 07/21/14 0520 07/21/14 0700 07/21/14 1004  BP:  144/42  136/68  Pulse: 62 67    Temp:  97.5 F (36.4 C)    TempSrc:  Oral    Resp:  18    Height:      Weight:  119 lb 14.9 oz (54.4 kg)    SpO2:  96% 95%     Intake/Output Summary (Last 24 hours) at 07/21/14 1012 Last data filed at 07/21/14 0630  Gross per 24 hour  Intake    460 ml  Output   1400 ml  Net   -940 ml    Telemetry reveals sinus rhythm  GEN- The patient is elderly appearing, alert and oriented x 3 today.   Head- normocephalic, atraumatic Eyes-  Sclera clear, conjunctiva pink Ears- hearing intact Nose bleed this am Oropharynx- clear Neck- supple , JVP 9cm Lungs- normal work of breathing, decreased BS at the bases Heart- Regular rate and rhythm  GI- soft, NT, ND, + BS Extremities- no clubbing, cyanosis, or edema Skin- no rash or lesion Psych- tearful and sad that she has not gotten better Neuro- strength and sensation are intact  LABS: Basic Metabolic Panel:  Recent  Labs  07/19/14 0455 07/20/14 0320  NA 132* 133*  K 4.1 3.7  CL 99 101  CO2 22 22  GLUCOSE 120* 109*  BUN 83* 83*  CREATININE 3.18* 3.15*  CALCIUM 8.1* 8.2*    ASSESSMENT AND PLAN:  Principal Problem:   Acute renal failure Active Problems:   CKD (chronic kidney disease), stage III   Hypertension   Hypothyroidism   Renal artery stenosis   CHF (congestive heart failure)   Acute renal failure superimposed on stage 3 chronic kidney disease   Acute on chronic diastolic congestive heart failure  1. SOB- multifactoral Seems out of proportion to volume overload May have to consider other causes if not improved  2. Acute on chronic diastolic dysfunction Limited by renal function BP is stable  3. Acute on chronic renal failure I think that our options are limited  4. HTN Presently stable I will stop minoxidil today as this is a high risk medicine for this frail lady.  If her blood pressure increases then we can restart.  Her overall prognosis is quite  poor Ultimately, palliative measures may be required Today, I discussed end of life wishes.  She states that she is "ready to die when my time comes.  I will leave it in Gods hands". She has not thought about code status but will contemplate this further while here.  Thompson Grayer, MD 07/21/2014 10:12 AM

## 2014-07-22 LAB — BASIC METABOLIC PANEL
Anion gap: 12 (ref 5–15)
BUN: 88 mg/dL — ABNORMAL HIGH (ref 6–23)
CALCIUM: 8.9 mg/dL (ref 8.4–10.5)
CHLORIDE: 99 mmol/L (ref 96–112)
CO2: 23 mmol/L (ref 19–32)
CREATININE: 3.16 mg/dL — AB (ref 0.50–1.10)
GFR calc non Af Amer: 12 mL/min — ABNORMAL LOW (ref >90)
GFR, EST AFRICAN AMERICAN: 14 mL/min — AB (ref >90)
Glucose, Bld: 177 mg/dL — ABNORMAL HIGH (ref 70–99)
Potassium: 3.6 mmol/L (ref 3.5–5.1)
Sodium: 134 mmol/L — ABNORMAL LOW (ref 135–145)

## 2014-07-22 MED ORDER — POLYETHYLENE GLYCOL 3350 17 G PO PACK
17.0000 g | PACK | Freq: Every day | ORAL | Status: DC
  Administered 2014-07-23: 17 g via ORAL
  Filled 2014-07-22 (×2): qty 1

## 2014-07-22 NOTE — Progress Notes (Addendum)
CARE MANAGEMENT NOTE 07/22/2014  Patient:  Rachael [name], Rachael [name]   Account Number:  [id number]  Date Initiated:  07/19/2014  Documentation initiated by:  AMERSON,JULIE  Subjective/Objective Assessment:   Pt adm on 07/17/14 with CHF.  PTA, pt lives alone and is independent.     Action/Plan:   Pt for likely dc over the weekend.  Will need HH follow up. She prefers HH with [place]; referral to [facility] with [facility] agency.  Start of care 24-48h post dc date.   Anticipated DC Date:  07/21/2014   Anticipated DC Plan:  [place]  CM consult      [facility] Choice  HOME HEALTH   Choice offered to / List presented to:  C-1 Patient        [place] arranged  HH-1 RN  [place] [facility]      [facility] agency  [facility]   Status of service:  Completed, signed off Medicare Important Message given?  YES (If response is "NO", the following Medicare IM given date fields will be blank) Date Medicare IM given:  07/22/2014 Medicare IM given by:  [facility] Date Additional Medicare IM given:   Additional Medicare IM given by:    Discharge Disposition:  [place]  Per UR Regulation:  Reviewed for med. necessity/level of care/duration of stay  If discussed at [place] of Stay Meetings, dates discussed:    Comments:  07/22/2014 1130 NCM spoke to pt and states she still drives. Plans to pursue dialysis 3 days per week. Notified [facility] [place] orders in system for home. Waiting final dc disposition. NCM will continue to follow up for dc needs. Will follow up on oxygen needs for home.  Jonnie Finner RN CCM Case Mgmt phone [phone]  friend Algernon Huxley [phone] nephew  [place] [phone]

## 2014-07-22 NOTE — Progress Notes (Signed)
Patient ID: Rachael [name], female   DOB: 1923-09-04, 81 y.o.   MRN: [id number] S:Feels weak and no appetite O:BP 120/67 mmHg  Pulse 61  Temp(Src) 98.9 F (37.2 C) (Oral)  Resp 17  Ht 5' 8.75" (1.746 m)  Wt 55.067 kg (121 lb 6.4 oz)  BMI 18.06 kg/m2  SpO2 93%  Intake/Output Summary (Last 24 hours) at 07/22/14 1148 Last data filed at 07/22/14 0953  Gross per 24 hour  Intake    990 ml  Output   1600 ml  Net   -610 ml   Intake/Output: I/O last 3 completed shifts: In: 0175 [P.O.:1090] Out: 2650 [Urine:2650]  Intake/Output this shift:  Total I/O In: 240 [P.O.:240] Out: 100 [Urine:100] Weight change: 0.667 kg (1 lb 7.5 oz) [name] elderly WF in NAd CVS:no rub Resp:decreased BS at bases, poor inspiratory effort Abd:+BS, soft Ext:+edema   Recent Labs Lab 07/17/14 1235 07/18/14 0411 07/19/14 0455 07/20/14 0320 07/21/14 1158 07/22/14 1040  NA 133* 136 132* 133* 136 134*  K 4.5 4.4 4.1 3.7 3.7 3.6  CL 96 99 99 101 100 99  CO2 26 21 22 22 25 23   GLUCOSE 154* 121* 120* 109* 120* 177*  BUN 78* 79* 83* 83* 83* 88*  CREATININE 3.06* 3.16* 3.18* 3.15* 2.91* 3.16*  ALBUMIN  --   --   --   --  3.3*  --   CALCIUM 9.2 8.6 8.1* 8.2* 8.7 8.9  PHOS  --   --   --   --  5.6*  --    Liver Function Tests:  Recent Labs Lab 07/21/14 1158  ALBUMIN 3.3*   No results for input(s): LIPASE, AMYLASE in the last 168 hours. No results for input(s): AMMONIA in the last 168 hours. CBC:  Recent Labs Lab 07/17/14 1235 07/18/14 0411  WBC 10.5 9.1  NEUTROABS 9.0*  --   HGB 10.3* 8.9*  HCT 30.5* 27.3*  MCV 86.2 86.9  PLT 300 272   Cardiac Enzymes: No results for input(s): CKTOTAL, CKMB, CKMBINDEX, TROPONINI in the last 168 hours. CBG: No results for input(s): GLUCAP in the last 168 hours.  Iron Studies: No results for input(s): IRON, TIBC, TRANSFERRIN, FERRITIN in the last 72 hours. Studies/Results: No results found. Marland Kitchen amLODipine  10 mg Oral Daily  . aspirin EC  81 mg Oral  Daily  . atorvastatin  20 mg Oral Daily  . bisoprolol  10 mg Oral Daily  . cholecalciferol  1,000 Units Oral q morning - 10a  . cloNIDine  0.1 mg Oral TID  . docusate sodium  100 mg Oral BID  . furosemide  80 mg Intravenous TID  . heparin  5,000 Units Subcutaneous 3 times per day  . hydrALAZINE  100 mg Oral TID  . levothyroxine  175 mcg Oral QAC breakfast  . multivitamin with minerals  1 tablet Oral q morning - 10a  . multivitamin-lutein  1 capsule Oral Daily  . pantoprazole  40 mg Oral BID AC  . polyvinyl alcohol  1 drop Both Eyes TID  . sodium chloride  1 drop Both Eyes QHS  . sodium chloride  3 mL Intravenous Q12H    BMET    Component Value Date/Time   NA 134* 07/22/2014 1040   K 3.6 07/22/2014 1040   CL 99 07/22/2014 1040   CO2 23 07/22/2014 1040   GLUCOSE 177* 07/22/2014 1040   BUN 88* 07/22/2014 1040   CREATININE 3.16* 07/22/2014 1040   CREATININE 2.01* 07/04/2014 1217  CALCIUM 8.9 07/22/2014 1040   GFRNONAA 12* 07/22/2014 1040   GFRNONAA 22* 07/04/2014 1217   GFRAA 14* 07/22/2014 1040   GFRAA 25* 07/04/2014 1217   CBC    Component Value Date/Time   WBC 9.1 07/18/2014 0411   RBC 3.14* 07/18/2014 0411   HGB 8.9* 07/18/2014 0411   HCT 27.3* 07/18/2014 0411   PLT 272 07/18/2014 0411   MCV 86.9 07/18/2014 0411   MCH 28.3 07/18/2014 0411   MCHC 32.6 07/18/2014 0411   RDW 14.5 07/18/2014 0411   LYMPHSABS 0.5* 07/17/2014 1235   MONOABS 0.9 07/17/2014 1235   EOSABS 0.0 07/17/2014 1235   BASOSABS 0.0 07/17/2014 1235   81 yo with history of CKD, HTN, PAD including dopplers recently suggestive of "> 60 % right renal artery stenosis", < 60% left renal artery stenosis, significant atherosclerosis of the distal aorta, chronic diastolic CHF, and CKD stage 4 at baseline.  Assessment/Plan:  1. AKI/CKD- in setting of decompensated CHF +/- R RAS.  Pt is a poor dialysis candidate given her advanced age and protein malnutrition as well as her debilitated state.  Agree with  palliative care consult to discuss goals/limits of care. 2. Acute on chronic diastolic CHF- diuresing slowly. 3. Possible right renal artery stenosis- given severe atherosclerosis of distal aorta, high risk for intervention and would treat medically. 4. Anemia of chronic disease- will check iron stores and ESA (pending palliative care eval) 5. HTN- stable 6. Anorexia/FTT- cont with protein supplements, consider megace 7. Dispo- agree with palliative care consult.  [place] A

## 2014-07-22 NOTE — Progress Notes (Signed)
Physical Therapy Treatment Patient Details Name: Rachael [name] MRN: [id number] DOB: Oct 08, 1923 Today's Date: 07/22/2014    History of Present Illness      PT Comments    Pt more fatigued today requiring increased assist with mobility.  Pt ambulated in room min guard assist without A.D. RW used for hallway ambulation for increased stability.  Pt also donned shoes for ambulation which significantly improves balance/stability.  Spoke with pt regarding possible need for SNF prior to d/c home pending progress with mobility.   Follow Up Recommendations  Home health PT     Equipment Recommendations  None recommended by PT    Recommendations for Other Services       Precautions / Restrictions Precautions Precautions: Fall Precaution Comments: desats on RA Restrictions Weight Bearing Restrictions: No    Mobility  Bed Mobility               General bed mobility comments: Pt received in recliner.  Transfers       Sit to Stand: Min guard Stand pivot transfers: Min guard       General transfer comment: verbal cues for safety  Ambulation/Gait Ambulation/Gait assistance: Min guard Ambulation Distance (Feet): 150 Feet Assistive device: Rolling walker (2 wheeled) Gait Pattern/deviations: Step-through pattern;Decreased stride length Gait velocity: decreased   General Gait Details: Ambulated on RA.  O2 sats on 3 L prior to amb. 96% and 83% after amb on RA.  Pt returned to 3 L via [place] and recovered to 94% after 1 minute.   Stairs            Wheelchair Mobility    Modified Rankin (Stroke Patients Only)       Balance                                    Cognition Arousal/Alertness: Awake/alert Behavior During Therapy: WFL for tasks assessed/performed Overall Cognitive Status: Within Functional Limits for tasks assessed                      Exercises      General Comments        Pertinent Vitals/Pain Pain Assessment:  No/denies pain    Home Living                      Prior Function            PT Goals (current goals can now be found in the care plan section) Progress towards PT goals: Progressing toward goals    Frequency  Min 3X/week    PT Plan Current plan remains appropriate    Co-evaluation             End of Session Equipment Utilized During Treatment: Gait belt Activity Tolerance: Patient limited by fatigue Patient left: in chair;with call bell/phone within reach     Time: 0928-0959 PT Time Calculation (min) (ACUTE ONLY): 31 min  Charges:  $Gait Training: 23-37 mins                    G Codes:      Lorriane Shire 07/22/2014, 10:01 AM

## 2014-07-22 NOTE — Progress Notes (Signed)
Utilization review complete. Aven Christen RN CCM Case Mgmt phone [phone] 

## 2014-07-22 NOTE — Progress Notes (Signed)
Patient ID: Rachael [name], female   DOB: 06-17-1923, 81 y.o.   MRN: [id number]   SUBJECTIVE: 1500 cc UOP yesterday with IV Lasix.  BP controlled, off minoxidil now.  Some dyspnea with ambulation.  Frustrated.  No labs yet today.   Scheduled Meds: . amLODipine  10 mg Oral Daily  . aspirin EC  81 mg Oral Daily  . atorvastatin  20 mg Oral Daily  . bisoprolol  10 mg Oral Daily  . cholecalciferol  1,000 Units Oral q morning - 10a  . cloNIDine  0.1 mg Oral TID  . docusate sodium  100 mg Oral BID  . furosemide  80 mg Intravenous TID  . heparin  5,000 Units Subcutaneous 3 times per day  . hydrALAZINE  100 mg Oral TID  . levothyroxine  175 mcg Oral QAC breakfast  . multivitamin with minerals  1 tablet Oral q morning - 10a  . multivitamin-lutein  1 capsule Oral Daily  . pantoprazole  40 mg Oral BID AC  . polyvinyl alcohol  1 drop Both Eyes TID  . sodium chloride  1 drop Both Eyes QHS  . sodium chloride  3 mL Intravenous Q12H   Continuous Infusions:  PRN Meds:.acetaminophen **OR** acetaminophen, albuterol, hydrALAZINE, ondansetron **OR** ondansetron (ZOFRAN) IV, sodium chloride    Filed Vitals:   07/21/14 1800 07/21/14 2044 07/21/14 2205 07/22/14 0604  BP: 137/39 135/45  127/41  Pulse: 64 62  63  Temp: 97.7 F (36.5 C) 98.1 F (36.7 C)  98.9 F (37.2 C)  TempSrc: Oral Oral  Oral  Resp: 18 16  17   Height:      Weight:    121 lb 6.4 oz (55.067 kg)  SpO2: 92% 91% 92% 93%    Intake/Output Summary (Last 24 hours) at 07/22/14 0743 Last data filed at 07/22/14 9024  Gross per 24 hour  Intake    750 ml  Output   1550 ml  Net   -800 ml    LABS: Basic Metabolic Panel:  Recent Labs  07/20/14 0320 07/21/14 1158  NA 133* 136  K 3.7 3.7  CL 101 100  CO2 22 25  GLUCOSE 109* 120*  BUN 83* 83*  CREATININE 3.15* 2.91*  CALCIUM 8.2* 8.7  PHOS  --  5.6*   Liver Function Tests:  Recent Labs  07/21/14 1158  ALBUMIN 3.3*   No results for input(s): LIPASE, AMYLASE in the  last 72 hours. CBC: No results for input(s): WBC, NEUTROABS, HGB, HCT, MCV, PLT in the last 72 hours. Cardiac Enzymes: No results for input(s): CKTOTAL, CKMB, CKMBINDEX, TROPONINI in the last 72 hours. BNP: Invalid input(s): POCBNP D-Dimer: No results for input(s): DDIMER in the last 72 hours. Hemoglobin A1C: No results for input(s): HGBA1C in the last 72 hours. Fasting Lipid Panel: No results for input(s): CHOL, HDL, LDLCALC, TRIG, CHOLHDL, LDLDIRECT in the last 72 hours. Thyroid Function Tests: No results for input(s): TSH, T4TOTAL, T3FREE, THYROIDAB in the last 72 hours.  Invalid input(s): FREET3 Anemia Panel: No results for input(s): VITAMINB12, FOLATE, FERRITIN, TIBC, IRON, RETICCTPCT in the last 72 hours.  RADIOLOGY: Dg Chest 2 View  07/17/2014   CLINICAL DATA:  Worsening shortness of breath over 2 days  EXAM: CHEST  2 VIEW  COMPARISON:  11/29/2013  FINDINGS: Cardiac shadow is within normal limits. Mild vascular congestion is seen. Small bilateral pleural effusions are noted but improved from the prior exam. Bibasilar scarring is seen. Bilateral nipple shadows are noted.  IMPRESSION: Small  bilateral pleural effusions.  Mild vascular congestion.   Electronically Signed   By: Rachael [name] M.D.   On: 07/17/2014 13:49    PHYSICAL EXAM General: NAD Neck: JVP 10-12 cm, no thyromegaly or thyroid nodule.  Lungs: Clear to auscultation bilaterally with normal respiratory effort. CV: Nondisplaced PMI.  Heart regular S1/S2, no S3/S4, 2/6 SEM RUSB.  1+ edema 1/2 to knees bilaterally.  Abdomen: Soft, nontender, no hepatosplenomegaly, no distention.  Neurologic: Alert and oriented x 3.  Psych: Normal affect. Extremities: No clubbing or cyanosis.   TELEMETRY: Reviewed telemetry pt in NSR  ASSESSMENT AND PLAN: 81 yo with history of CKD, renovascular HTN, PAD including bilateral renal artery stenosis, and chronic diastolic CHF was admitted with dyspnea and AKI.  1. AKI on CKD: Difficult  situation. Had been seeing PCP frequently to work on lowering her BP, which had been quite high and uncontrolled. I suspect she has renovascular hypertension with severe right renal artery stenosis and borderline significant left renal artery stenosis. She was admitted with rise in creatinine from 1.7 => 3. I am concerned that we may be significantly impairing her GFR with aggressive BP lowering in the face of possible bilateral significant renal artery stenosis. Unfortunately, she is also volume overloaded currently (Got IV fluid initially) with creatinine still elevated. This is a difficult situation. I am afraid that diuresis and BP control are going to worsen her renal function but allowing BP to ride higher and avoiding aggressive diuresis will lead to worsening CHF. If we could intervene to open her renal arteries, that may be ideal. I have spoken with Dr Rachael [name] who saw her for vascular consult recently. He could try intervention on the renal arteries using CO2 angiography which would limit IV contrast, but she would still have to get some contrast. Therefore, would like to see renal function closer to her baseline before thinking about this.Nephrology now following, awaiting today's BMET. 2. HTN: Poor control historically with possible renovascular HTN. See discussion above about concern that aggressive BP control will worsen GFR with bilateral RAS. Would aim to let SBP run around 150. BP currently stable, have cut back on her meds.  Now off minoxidil. 3. Acute on chronic diastolic CHF: This is difficult. She is volume overloaded but renal function remains considerably worse than baseline. She has been started on Lasix 80 mg IV every 8 hrs with some urine output, would continue for now.   Rachael [name] 07/22/2014 7:47 AM

## 2014-07-22 NOTE — Progress Notes (Signed)
Patient Demographics  Rachael [name], is a 81 y.o. female, DOB - November 05, 1923, HYI:[id number]  Admit date - 07/17/2014   Admitting Physician No admitting provider for patient encounter.  Outpatient Primary MD for the patient is Alesia Richards, MD  LOS - 5   Chief Complaint  Patient presents with  . Shortness of Breath        Subjective:   Rachael [name] today has, No headache, No chest pain, No abdominal pain - No Nausea, No new weakness tingling or numbness, No Cough - continues to have shortness of breath.  Assessment & Plan    1. Acute respiratory, fluid overload due to acute on chronic diastolic CHF EF 78% on recent echo. Does have evidence of fluid overload, stopped IV fluids, IV Lasix and Zaroxolyn adjusted, continue beta blocker.  Continue supportive care and monitor. Try to titrate off O2. Diuresis has been a challenge due to poor baseline renal function. Cardiology and renal both following.  Filed Weights   07/20/14 0516 07/21/14 0520 07/22/14 0604  Weight: 55.203 kg (121 lb 11.2 oz) 54.4 kg (119 lb 14.9 oz) 55.067 kg (121 lb 6.4 oz)     2. ARF and the kidney disease stage IV. Baseline creatinine around 2, diurese and monitor. Likely due to CHF decompensation. Avoid nephrotoxins. Renal following.   3. Essential hypertension. Continue home medications unchanged at this point.   4. Hyperglycemia in the ER. Likely due to stress. Recent A1c was 5.6.     Code Status: Full  Family Communication: Cards, renal  Disposition Plan: To be decided   Procedures    Consults  none   Medications  Scheduled Meds: . amLODipine  10 mg Oral Daily  . aspirin EC  81 mg Oral Daily  . atorvastatin  20 mg Oral Daily  . bisoprolol  10 mg Oral Daily  . cholecalciferol  1,000  Units Oral q morning - 10a  . cloNIDine  0.1 mg Oral TID  . docusate sodium  100 mg Oral BID  . furosemide  80 mg Intravenous TID  . heparin  5,000 Units Subcutaneous 3 times per day  . hydrALAZINE  100 mg Oral TID  . levothyroxine  175 mcg Oral QAC breakfast  . multivitamin with minerals  1 tablet Oral q morning - 10a  . multivitamin-lutein  1 capsule Oral Daily  . pantoprazole  40 mg Oral BID AC  . polyvinyl alcohol  1 drop Both Eyes TID  . sodium chloride  1 drop Both Eyes QHS  . sodium chloride  3 mL Intravenous Q12H   Continuous Infusions:  PRN Meds:.acetaminophen **OR** acetaminophen, albuterol, hydrALAZINE, ondansetron **OR** ondansetron (ZOFRAN) IV, sodium chloride  DVT Prophylaxis   Heparin  Lab Results  Component Value Date   PLT 272 07/18/2014    Antibiotics    Anti-infectives    None          Objective:   Filed Vitals:   07/21/14 2205 07/22/14 0604 07/22/14 0850 07/22/14 0911  BP:  127/41 120/67 120/67  Pulse:  63 61   Temp:  98.9 F (37.2 C)    TempSrc:  Oral    Resp:  17    Height:      Weight:  55.067 kg (  121 lb 6.4 oz)    SpO2: 92% 93%      Wt Readings from Last 3 Encounters:  07/22/14 55.067 kg (121 lb 6.4 oz)  07/15/14 55.611 kg (122 lb 9.6 oz)  07/11/14 55.883 kg (123 lb 3.2 oz)     Intake/Output Summary (Last 24 hours) at 07/22/14 1109 Last data filed at 07/22/14 0953  Gross per 24 hour  Intake    990 ml  Output   1600 ml  Net   -610 ml     Physical Exam  Awake Alert, Oriented X 3, No new F.N deficits, Normal affect [place].AT,PERRAL Supple Neck,No JVD, No cervical lymphadenopathy appriciated.  Symmetrical Chest wall movement, Good air movement bilaterally, rales RRR,No Gallops,Rubs or new Murmurs, No Parasternal Heave +ve B.Sounds, Abd Soft, No tenderness, No organomegaly appriciated, No rebound - guarding or rigidity. No Cyanosis, Clubbing , trace edema, No new Rash or bruise     Data Review   Micro Results Recent Results  (from the past 240 hour(s))  Urine culture     Status: None   Collection Time: 07/17/14  6:49 PM  Result Value Ref Range Status   Specimen Description URINE, CLEAN CATCH  Final   Special Requests NONE  Final   Colony Count NO GROWTH Performed at [facility]   Final   Culture NO GROWTH Performed at [facility]   Final   Report Status 07/18/2014 FINAL  Final    Radiology Reports Dg Chest 2 View  07/17/2014   CLINICAL DATA:  Worsening shortness of breath over 2 days  EXAM: CHEST  2 VIEW  COMPARISON:  11/29/2013  FINDINGS: Cardiac shadow is within normal limits. Mild vascular congestion is seen. Small bilateral pleural effusions are noted but improved from the prior exam. Bibasilar scarring is seen. Bilateral nipple shadows are noted.  IMPRESSION: Small bilateral pleural effusions.  Mild vascular congestion.   Electronically Signed   By: [name] M.D.   On: 07/17/2014 13:49     CBC  Recent Labs Lab 07/17/14 1235 07/18/14 0411  WBC 10.5 9.1  HGB 10.3* 8.9*  HCT 30.5* 27.3*  PLT 300 272  MCV 86.2 86.9  MCH 29.1 28.3  MCHC 33.8 32.6  RDW 14.3 14.5  LYMPHSABS 0.5*  --   MONOABS 0.9  --   EOSABS 0.0  --   BASOSABS 0.0  --     Chemistries   Recent Labs Lab 07/17/14 1235 07/18/14 0411 07/19/14 0455 07/20/14 0320 07/21/14 1158  NA 133* 136 132* 133* 136  K 4.5 4.4 4.1 3.7 3.7  CL 96 99 99 101 100  CO2 26 21 22 22 25   GLUCOSE 154* 121* 120* 109* 120*  BUN 78* 79* 83* 83* 83*  CREATININE 3.06* 3.16* 3.18* 3.15* 2.91*  CALCIUM 9.2 8.6 8.1* 8.2* 8.7   ------------------------------------------------------------------------------------------------------------------ estimated creatinine clearance is 11.6 mL/min (by C-G formula based on Cr of 2.91). ------------------------------------------------------------------------------------------------------------------ No results for input(s): HGBA1C in the last 72  hours. ------------------------------------------------------------------------------------------------------------------ No results for input(s): CHOL, HDL, LDLCALC, TRIG, CHOLHDL, LDLDIRECT in the last 72 hours. ------------------------------------------------------------------------------------------------------------------ No results for input(s): TSH, T4TOTAL, T3FREE, THYROIDAB in the last 72 hours.  Invalid input(s): FREET3 ------------------------------------------------------------------------------------------------------------------ No results for input(s): VITAMINB12, FOLATE, FERRITIN, TIBC, IRON, RETICCTPCT in the last 72 hours.  Coagulation profile No results for input(s): INR, PROTIME in the last 168 hours.  No results for input(s): DDIMER in the last 72 hours.  Cardiac Enzymes No results for input(s): CKMB, TROPONINI, MYOGLOBIN  in the last 168 hours.  Invalid input(s): CK ------------------------------------------------------------------------------------------------------------------ Invalid input(s): POCBNP     Time Spent in minutes   35   Dalal Livengood K M.D on 07/22/2014 at 11:09 AM  Between 7am to 7pm - Pager - [phone]  After 7pm go to [email or web address] - password [facility]  [facility]   Office  [phone]

## 2014-07-23 ENCOUNTER — Inpatient Hospital Stay (HOSPITAL_COMMUNITY): Payer: Medicare Other

## 2014-07-23 ENCOUNTER — Ambulatory Visit: Payer: Medicare Other | Admitting: Internal Medicine

## 2014-07-23 LAB — BASIC METABOLIC PANEL
Anion gap: 15 (ref 5–15)
BUN: 88 mg/dL — ABNORMAL HIGH (ref 6–23)
CO2: 25 mmol/L (ref 19–32)
Calcium: 9.3 mg/dL (ref 8.4–10.5)
Chloride: 96 mmol/L (ref 96–112)
Creatinine, Ser: 2.99 mg/dL — ABNORMAL HIGH (ref 0.50–1.10)
GFR calc Af Amer: 15 mL/min — ABNORMAL LOW (ref >90)
GFR calc non Af Amer: 13 mL/min — ABNORMAL LOW (ref >90)
GLUCOSE: 187 mg/dL — AB (ref 70–99)
Potassium: 3.2 mmol/L — ABNORMAL LOW (ref 3.5–5.1)
Sodium: 136 mmol/L (ref 135–145)

## 2014-07-23 MED ORDER — DOCUSATE SODIUM 100 MG PO CAPS
200.0000 mg | ORAL_CAPSULE | Freq: Two times a day (BID) | ORAL | Status: DC
  Administered 2014-07-23 – 2014-07-24 (×4): 200 mg via ORAL
  Filled 2014-07-23 (×8): qty 2

## 2014-07-23 MED ORDER — METOLAZONE 2.5 MG PO TABS
2.5000 mg | ORAL_TABLET | Freq: Once | ORAL | Status: AC
  Administered 2014-07-23: 2.5 mg via ORAL
  Filled 2014-07-23: qty 1

## 2014-07-23 MED ORDER — MORPHINE SULFATE 2 MG/ML IJ SOLN
2.0000 mg | INTRAMUSCULAR | Status: DC | PRN
  Administered 2014-07-23 – 2014-07-25 (×3): 2 mg via INTRAVENOUS
  Filled 2014-07-23 (×3): qty 1

## 2014-07-23 MED ORDER — SENNOSIDES-DOCUSATE SODIUM 8.6-50 MG PO TABS
1.0000 | ORAL_TABLET | Freq: Two times a day (BID) | ORAL | Status: DC
  Filled 2014-07-23 (×2): qty 1

## 2014-07-23 MED ORDER — POLYETHYLENE GLYCOL 3350 17 G PO PACK: 17.0000 g | PACK | Freq: Once | ORAL | Status: DC

## 2014-07-23 MED ORDER — POLYETHYLENE GLYCOL 3350 17 G PO PACK
17.0000 g | PACK | Freq: Two times a day (BID) | ORAL | Status: DC
  Filled 2014-07-23: qty 1

## 2014-07-23 MED ORDER — POTASSIUM CHLORIDE CRYS ER 20 MEQ PO TBCR
40.0000 meq | EXTENDED_RELEASE_TABLET | ORAL | Status: AC
  Administered 2014-07-23 (×2): 40 meq via ORAL
  Filled 2014-07-23 (×2): qty 2

## 2014-07-23 MED ORDER — POLYETHYLENE GLYCOL 3350 17 G PO PACK
17.0000 g | PACK | Freq: Four times a day (QID) | ORAL | Status: AC
  Administered 2014-07-23 – 2014-07-24 (×3): 17 g via ORAL
  Filled 2014-07-23 (×4): qty 1

## 2014-07-23 MED ORDER — BISACODYL 10 MG RE SUPP: 10.0000 mg | Freq: Once | RECTAL | Status: DC

## 2014-07-23 MED ORDER — BISACODYL 5 MG PO TBEC
10.0000 mg | DELAYED_RELEASE_TABLET | Freq: Every day | ORAL | Status: DC | PRN
  Administered 2014-07-23: 10 mg via ORAL
  Filled 2014-07-23: qty 2

## 2014-07-23 MED ORDER — BISACODYL 10 MG RE SUPP
10.0000 mg | Freq: Every day | RECTAL | Status: DC
  Administered 2014-07-23 – 2014-07-29 (×4): 10 mg via RECTAL
  Filled 2014-07-23 (×2): qty 1

## 2014-07-23 NOTE — Progress Notes (Signed)
OT Cancellation Note  Patient Details Name: Rachael [name] MRN: [id number] DOB: 1923-07-19   Cancelled Treatment:    Reason Eval/Treat Not Completed: Pain limiting ability to participate;Other (comment). Pt continues to have N/V and abdominal pain  Britt Bottom 07/23/2014, 1:55 PM

## 2014-07-23 NOTE — Progress Notes (Signed)
OT Cancellation Note  Patient Details Name: Rachael [name] MRN: [id number] DOB: 1924-02-20   Cancelled Treatment:    Reason Eval/Treat Not Completed: Pain limiting ability to participate;Other (comment). Pt states her level of pain in abdomen is 10/10 and that she is nauseous , nrsg notified. Will re attempt later today as time allows/as approrpriate  Britt Bottom 07/23/2014, 9:56 AM

## 2014-07-23 NOTE — Progress Notes (Signed)
RN manually disimpacted pt with little success, stool was soft.  RN gave pt soap suds enema with little success.  Pt was unable to hold enema in.  MD notified, will continue to monitor pt.

## 2014-07-23 NOTE — Progress Notes (Signed)
Patient ID: Rachael [name], female   DOB: [birth year], 81 y.o.   MRN: [id number]   SUBJECTIVE:  BP controlled, off minoxidil now.  Sluggish urine output. Weight unchanged. Creatinine down a little.   Complaining of constipation. Denies SOB.      Scheduled Meds: . amLODipine  10 mg Oral Daily  . aspirin EC  81 mg Oral Daily  . atorvastatin  20 mg Oral Daily  . bisoprolol  10 mg Oral Daily  . cholecalciferol  1,000 Units Oral q morning - 10a  . cloNIDine  0.1 mg Oral TID  . docusate sodium  100 mg Oral BID  . furosemide  80 mg Intravenous TID  . heparin  5,000 Units Subcutaneous 3 times per day  . hydrALAZINE  100 mg Oral TID  . levothyroxine  175 mcg Oral QAC breakfast  . multivitamin with minerals  1 tablet Oral q morning - 10a  . multivitamin-lutein  1 capsule Oral Daily  . pantoprazole  40 mg Oral BID AC  . polyethylene glycol  17 g Oral Daily  . polyvinyl alcohol  1 drop Both Eyes TID  . potassium chloride  40 mEq Oral Q4H  . sodium chloride  1 drop Both Eyes QHS  . sodium chloride  3 mL Intravenous Q12H   Continuous Infusions:  PRN Meds:.acetaminophen **OR** acetaminophen, albuterol, hydrALAZINE, ondansetron **OR** ondansetron (ZOFRAN) IV, sodium chloride    Filed Vitals:   07/22/14 1456 07/22/14 2056 07/23/14 0442 07/23/14 0637  BP: 140/58 146/36 167/49 166/61  Pulse: 60 65 66 67  Temp: 98 F (36.7 C) 98.6 F (37 C) 98.2 F (36.8 C) 97.2 F (36.2 C)  TempSrc: Oral Oral Oral Axillary  Resp: 18 20 18 20   Height:      Weight:   121 lb 12.8 oz (55.248 kg)   SpO2: 93% 92% 95% 94%    Intake/Output Summary (Last 24 hours) at 07/23/14 0716 Last data filed at 07/23/14 0600  Gross per 24 hour  Intake   1228 ml  Output    879 ml  Net    349 ml    LABS: Basic Metabolic Panel:  Recent Labs  07/21/14 1158 07/22/14 1040 07/23/14 0425  NA 136 134* 136  K 3.7 3.6 3.2*  CL 100 99 96  CO2 25 23 25   GLUCOSE 120* 177* 187*  BUN 83* 88* 88*  CREATININE 2.91*  3.16* 2.99*  CALCIUM 8.7 8.9 9.3  PHOS 5.6*  --   --    Liver Function Tests:  Recent Labs  07/21/14 1158  ALBUMIN 3.3*   No results for input(s): LIPASE, AMYLASE in the last 72 hours. CBC: No results for input(s): WBC, NEUTROABS, HGB, HCT, MCV, PLT in the last 72 hours. Cardiac Enzymes: No results for input(s): CKTOTAL, CKMB, CKMBINDEX, TROPONINI in the last 72 hours. BNP: Invalid input(s): POCBNP D-Dimer: No results for input(s): DDIMER in the last 72 hours. Hemoglobin A1C: No results for input(s): HGBA1C in the last 72 hours. Fasting Lipid Panel: No results for input(s): CHOL, HDL, LDLCALC, TRIG, CHOLHDL, LDLDIRECT in the last 72 hours. Thyroid Function Tests: No results for input(s): TSH, T4TOTAL, T3FREE, THYROIDAB in the last 72 hours.  Invalid input(s): FREET3 Anemia Panel: No results for input(s): VITAMINB12, FOLATE, FERRITIN, TIBC, IRON, RETICCTPCT in the last 72 hours.  RADIOLOGY: Dg Chest 2 View  07/17/2014   CLINICAL DATA:  Worsening shortness of breath over 2 days  EXAM: CHEST  2 VIEW  COMPARISON:  11/29/2013  FINDINGS: Cardiac shadow is within normal limits. Mild vascular congestion is seen. Small bilateral pleural effusions are noted but improved from the prior exam. Bibasilar scarring is seen. Bilateral nipple shadows are noted.  IMPRESSION: Small bilateral pleural effusions.  Mild vascular congestion.   Electronically Signed   By: [name] M.D.   On: 07/17/2014 13:49    PHYSICAL EXAM General: NAD Neck: JVP ~10 cm, no thyromegaly or thyroid nodule.  Lungs: Clear to auscultation bilaterally with normal respiratory effort. CV: Nondisplaced PMI.  Heart regular S1/S2, no S3/S4, 2/6 SEM RUSB.  1+ edema 1/2 to knees bilaterally.  Abdomen: Soft, nontender, no hepatosplenomegaly, no distention.  Neurologic: Alert and oriented x 3.  Psych: Normal affect. Extremities: No clubbing or cyanosis.  Skin: Steri strips upper back.   TELEMETRY: Reviewed telemetry pt in  NSR  ASSESSMENT AND PLAN: 81 yo with history of CKD, renovascular HTN, PAD including bilateral renal artery stenosis, and chronic diastolic CHF was admitted with dyspnea and AKI.  1. AKI on CKD: Difficult situation. Had been seeing PCP frequently to work on lowering her BP, which had been quite high and uncontrolled. I suspect she has renovascular hypertension with severe right renal artery stenosis and borderline significant left renal artery stenosis. She was admitted with rise in creatinine from 1.7 => 3.Concern that  BP lowering in the face of possible bilateral significant renal artery stenosis. Unfortunately, she is also volume overloaded currently (Got IV fluid initially) with creatinine still elevated. This is a difficult situation.Diuresis and BP control are going to worsen her renal function but allowing BP to ride higher and avoiding aggressive diuresis will lead to worsening CHF. If we could intervene to open her renal arteries, that may be ideal. I have spoken with Dr Fletcher Anon who saw her for vascular consult recently. He could try intervention on the renal arteries using CO2 angiography which would limit IV contrast, but she would still have to get some contrast. Therefore, would like to see renal function closer to her baseline before thinking about this so would hold off at this time.Nephrology follow. Creatinine down a little.  2. HTN: Poor control historically with possible renovascular HTN. See discussion above about concern that aggressive BP control will worsen GFR with bilateral RAS. Would aim to let SBP run around 150. BP currently stable, have cut back on her meds.  Now off minoxidil. 3. Acute on chronic diastolic CHF: This is difficult. She is volume overloaded. Weight unchanged. Sluggish urine output. She has been started on Lasix 80 mg IV every 8 hrs. Can try 2.5 mg metolazone.  4. Hypokalemia- K being supplemented    CLEGG,AMY NP-C  07/23/2014 7:16 AM   Patient  seen with NP, agree with the above note.  SBP stable around 160, will not add any meds.  Volume overloaded, will get dose of metolazone today.  She is constipated, will give laxatives and to get KUB.  Would hold off on any attempt at renal artery intervention for now, will discuss with Dr Fletcher Anon tomorrow (as above, potential to use CO2 angiography rather than contrast).   Loralie Champagne 07/23/2014 9:01 AM

## 2014-07-23 NOTE — Progress Notes (Signed)
Patient ID: Rachael [name], female   DOB: Jan 13, 1924, 81 y.o.   MRN: [id number] S:in severe discomfort in her abdomen, crying in pain O:BP 166/62 mmHg  Pulse 67  Temp(Src) 97.2 F (36.2 C) (Axillary)  Resp 20  Ht 5' 8.75" (1.746 m)  Wt 55.248 kg (121 lb 12.8 oz)  BMI 18.12 kg/m2  SpO2 94%  Intake/Output Summary (Last 24 hours) at 07/23/14 1016 Last data filed at 07/23/14 0916  Gross per 24 hour  Intake    988 ml  Output    779 ml  Net    209 ml   Intake/Output: I/O last 3 completed shifts: In: 5916 [P.O.:1448] Out: 1379 [Urine:1375; Stool:4]  Intake/Output this shift:    Weight change: 0.181 kg (6.4 oz) BWG:[id number] WF in moderate distress CVS:no rub Resp:cta TSV:[id number], tender, +firm nodular material in LLQ Ext:tr edema   Recent Labs Lab 07/17/14 1235 07/18/14 0411 07/19/14 0455 07/20/14 0320 07/21/14 1158 07/22/14 1040 07/23/14 0425  NA 133* 136 132* 133* 136 134* 136  K 4.5 4.4 4.1 3.7 3.7 3.6 3.2*  CL 96 99 99 101 100 99 96  CO2 26 21 22 22 25 23 25   GLUCOSE 154* 121* 120* 109* 120* 177* 187*  BUN 78* 79* 83* 83* 83* 88* 88*  CREATININE 3.06* 3.16* 3.18* 3.15* 2.91* 3.16* 2.99*  ALBUMIN  --   --   --   --  3.3*  --   --   CALCIUM 9.2 8.6 8.1* 8.2* 8.7 8.9 9.3  PHOS  --   --   --   --  5.6*  --   --    Liver Function Tests:  Recent Labs Lab 07/21/14 1158  ALBUMIN 3.3*   No results for input(s): LIPASE, AMYLASE in the last 168 hours. No results for input(s): AMMONIA in the last 168 hours. CBC:  Recent Labs Lab 07/17/14 1235 07/18/14 0411  WBC 10.5 9.1  NEUTROABS 9.0*  --   HGB 10.3* 8.9*  HCT 30.5* 27.3*  MCV 86.2 86.9  PLT 300 272   Cardiac Enzymes: No results for input(s): CKTOTAL, CKMB, CKMBINDEX, TROPONINI in the last 168 hours. CBG: No results for input(s): GLUCAP in the last 168 hours.  Iron Studies: No results for input(s): IRON, TIBC, TRANSFERRIN, FERRITIN in the last 72 hours. Studies/Results: Dg Abd Portable  1v  07/23/2014   CLINICAL DATA:  Abdominal pain since last night.  EXAM: PORTABLE ABDOMEN - 1 VIEW  COMPARISON:  CT of 03/28/2007  FINDINGS: 2 supine views. Dense thoracoabdominal aortic atherosclerosis. No bowel distention. Moderate amount of descending colonic and rectosigmoid stool. Equivocal calcifications projecting over the left side of the abdomen. Favored to be vascular. Left hip arthroplasty and right proximal femoral fixation.  Small bilateral pleural effusions with adjacent airspace disease.  IMPRESSION: No bowel obstruction or free intraperitoneal air. Possible constipation.  Probable vascular calcifications projecting over the left side of the abdomen. Cannot exclude left sided renal calculi. Of note, no stones are identified on the remote CT.  Small bilateral pleural effusions with adjacent airspace disease. Suboptimally evaluated.   Electronically Signed   By: Abigail Miyamoto M.D.   On: 07/23/2014 09:12   . amLODipine  10 mg Oral Daily  . aspirin EC  81 mg Oral Daily  . atorvastatin  20 mg Oral Daily  . bisacodyl  10 mg Rectal Once  . bisoprolol  10 mg Oral Daily  . cholecalciferol  1,000 Units Oral q morning - 10a  .  cloNIDine  0.1 mg Oral TID  . docusate sodium  200 mg Oral BID  . furosemide  80 mg Intravenous TID  . heparin  5,000 Units Subcutaneous 3 times per day  . hydrALAZINE  100 mg Oral TID  . levothyroxine  175 mcg Oral QAC breakfast  . multivitamin with minerals  1 tablet Oral q morning - 10a  . multivitamin-lutein  1 capsule Oral Daily  . pantoprazole  40 mg Oral BID AC  . polyethylene glycol  17 g Oral BID  . polyvinyl alcohol  1 drop Both Eyes TID  . potassium chloride  40 mEq Oral Q4H  . sodium chloride  1 drop Both Eyes QHS  . sodium chloride  3 mL Intravenous Q12H    BMET    Component Value Date/Time   NA 136 07/23/2014 0425   K 3.2* 07/23/2014 0425   CL 96 07/23/2014 0425   CO2 25 07/23/2014 0425   GLUCOSE 187* 07/23/2014 0425   BUN 88* 07/23/2014 0425    CREATININE 2.99* 07/23/2014 0425   CREATININE 2.01* 07/04/2014 1217   CALCIUM 9.3 07/23/2014 0425   GFRNONAA 13* 07/23/2014 0425   GFRNONAA 22* 07/04/2014 1217   GFRAA 15* 07/23/2014 0425   GFRAA 25* 07/04/2014 1217   CBC    Component Value Date/Time   WBC 9.1 07/18/2014 0411   RBC 3.14* 07/18/2014 0411   HGB 8.9* 07/18/2014 0411   HCT 27.3* 07/18/2014 0411   PLT 272 07/18/2014 0411   MCV 86.9 07/18/2014 0411   MCH 28.3 07/18/2014 0411   MCHC 32.6 07/18/2014 0411   RDW 14.5 07/18/2014 0411   LYMPHSABS 0.5* 07/17/2014 1235   MONOABS 0.9 07/17/2014 1235   EOSABS 0.0 07/17/2014 1235   BASOSABS 0.0 07/17/2014 1235     81 yo with history of CKD, HTN, PAD including dopplers recently suggestive of "> 60 % right renal artery stenosis", < 60% left renal artery stenosis, significant atherosclerosis of the distal aorta, chronic diastolic CHF, and CKD stage 4 at baseline.  Assessment/Plan:  1. AKI/CKD- in setting of decompensated CHF +/- R RAS. Pt is a poor dialysis candidate given her advanced age and protein malnutrition as well as her debilitated state. 1. Recommend palliative care consult to discuss goals/limits of care. 2. Acute on chronic diastolic CHF- diuresing slowly. 3. Possible right renal artery stenosis- given severe atherosclerosis of distal aorta, high risk for intervention and would treat medically. 4. Stool impaction- plan per primary svc 5. Anemia of chronic disease- will check iron stores and ESA (pending palliative care eval) 6. HTN- stable 7. Anorexia/FTT- cont with protein supplements, consider megace 8. Dispo- agree with palliative care consult.   [place] A

## 2014-07-23 NOTE — Progress Notes (Signed)
Dr Candiss Norse in to see pt informed of pt being constipated and meds given as ordered with little relief.  Will continue to monitor.  Karie Kirks, Therapist, sports.

## 2014-07-23 NOTE — Progress Notes (Signed)
PT Cancellation Note  Patient Details Name: Rachael [name] MRN: [id number] DOB: 11-17-1923   Cancelled Treatment:    Reason Eval/Treat Not Completed: Medical issues which prohibited therapy. Pt reports N&V as well as constipation this AM. RN aware.   Lorriane Shire 07/23/2014, 10:19 AM

## 2014-07-23 NOTE — Progress Notes (Signed)
Patient ID: Rachael [name], female   DOB: May 14, 1924, 81 y.o.   MRN: [id number]  I N     [facility]

## 2014-07-23 NOTE — Progress Notes (Signed)
Patient Demographics  Rachael [name], is a 81 y.o. female, DOB - 1924-04-23, KYH:[id number]  Admit date - 07/17/2014   Admitting Physician No admitting provider for patient encounter.  Outpatient Primary MD for the patient is Alesia Richards, MD  LOS - 6   Chief Complaint  Patient presents with  . Shortness of Breath      Brief summary  81 year old Caucasian female with history of diastolic diastolic dysfunction, chronic kidney disease stage V, early controlled hypertension, who was admitted to the hospital for CHF, diuresis has been in challenge due to poor renal function, cardiology renal both following. Her current issue is severe constipation. Bowel regimen has been initiated. She is still 2-3 days away from discharge as her diuresis has not been adequate so far. She is still short of breath has edema and requiring oxygen.    Subjective:   Rachael [name] today has, No headache, No chest pain, today has constipation and generalized abdominal pain - No Nausea, No new weakness tingling or numbness, No Cough - continues to have shortness of breath.  Assessment & Plan    1. Acute respiratory, fluid overload due to acute on chronic diastolic CHF EF 15% on recent echo. Does have evidence of fluid overload, stopped IV fluids, IV Lasix and Zaroxolyn adjusted, continue beta blocker.  Continue supportive care and monitor. Try to titrate off O2. Diuresis has been a challenge due to poor baseline renal function. Cardiology and renal both following.  Filed Weights   07/21/14 0520 07/22/14 0604 07/23/14 0442  Weight: 54.4 kg (119 lb 14.9 oz) 55.067 kg (121 lb 6.4 oz) 55.248 kg (121 lb 12.8 oz)     2. ARF and the kidney disease stage IV. Baseline creatinine around 2, diurese and monitor. Likely due  to CHF decompensation. Avoid nephrotoxins. Renal following.   3. Essential hypertension. Continue home medications unchanged at this point.   4. Hyperglycemia in the ER. Likely due to stress. Recent A1c was 5.6.   5. Constipation with abdominal pain. Abdominal x-ray shows no obstruction, manual disimpaction/enema/bowel regimen all initiated.   6. Hypokalemia. Replaced. Recheck in the morning   7. Hypothyroidism. Continue home dose Synthroid   8. Dyslipidemia. On statin      Code Status: Full  Family Communication: Cards, renal  Disposition Plan: To be decided   Procedures    Consults  none   Medications  Scheduled Meds: . amLODipine  10 mg Oral Daily  . aspirin EC  81 mg Oral Daily  . atorvastatin  20 mg Oral Daily  . bisacodyl  10 mg Rectal Daily  . bisoprolol  10 mg Oral Daily  . cholecalciferol  1,000 Units Oral q morning - 10a  . cloNIDine  0.1 mg Oral TID  . docusate sodium  200 mg Oral BID  . furosemide  80 mg Intravenous TID  . heparin  5,000 Units Subcutaneous 3 times per day  . hydrALAZINE  100 mg Oral TID  . levothyroxine  175 mcg Oral QAC breakfast  . multivitamin with minerals  1 tablet Oral q morning - 10a  . multivitamin-lutein  1 capsule Oral Daily  . pantoprazole  40 mg Oral BID AC  . polyethylene glycol  17 g Oral BID  .  polyvinyl alcohol  1 drop Both Eyes TID  . potassium chloride  40 mEq Oral Q4H  . sodium chloride  1 drop Both Eyes QHS  . sodium chloride  3 mL Intravenous Q12H   Continuous Infusions:  PRN Meds:.acetaminophen **OR** acetaminophen, albuterol, hydrALAZINE, morphine injection, ondansetron **OR** ondansetron (ZOFRAN) IV, sodium chloride  DVT Prophylaxis   Heparin  Lab Results  Component Value Date   PLT 272 07/18/2014    Antibiotics    Anti-infectives    None          Objective:   Filed Vitals:   07/22/14 2056 07/23/14 0442 07/23/14 0637 07/23/14 1007  BP:  167/49 166/61 166/62  Pulse: 65 66 67    Temp: 98.6 F (37 C) 98.2 F (36.8 C) 97.2 F (36.2 C)   TempSrc: Oral Oral Axillary   Resp: 20 18 20    Height:      Weight:  55.248 kg (121 lb 12.8 oz)    SpO2: 92% 95% 94%     Wt Readings from Last 3 Encounters:  07/23/14 55.248 kg (121 lb 12.8 oz)  07/15/14 55.611 kg (122 lb 9.6 oz)  07/11/14 55.883 kg (123 lb 3.2 oz)     Intake/Output Summary (Last 24 hours) at 07/23/14 1121 Last data filed at 07/23/14 0916  Gross per 24 hour  Intake    988 ml  Output    779 ml  Net    209 ml     Physical Exam  Awake Alert, Oriented X 3, No new F.N deficits, Normal affect [place].AT,PERRAL Supple Neck,No JVD, No cervical lymphadenopathy appriciated.  Symmetrical Chest wall movement, Good air movement bilaterally, rales RRR,No Gallops,Rubs or new Murmurs, No Parasternal Heave +ve B.Sounds, Abd Soft, No tenderness, No organomegaly appriciated, No rebound - guarding or rigidity. No Cyanosis, Clubbing , trace edema, No new Rash or bruise     Data Review   Micro Results Recent Results (from the past 240 hour(s))  Urine culture     Status: None   Collection Time: 07/17/14  6:49 PM  Result Value Ref Range Status   Specimen Description URINE, CLEAN CATCH  Final   Special Requests NONE  Final   Colony Count NO GROWTH Performed at [facility]   Final   Culture NO GROWTH Performed at [facility]   Final   Report Status 07/18/2014 FINAL  Final    Radiology Reports Dg Chest 2 View  07/17/2014   CLINICAL DATA:  Worsening shortness of breath over 2 days  EXAM: CHEST  2 VIEW  COMPARISON:  11/29/2013  FINDINGS: Cardiac shadow is within normal limits. Mild vascular congestion is seen. Small bilateral pleural effusions are noted but improved from the prior exam. Bibasilar scarring is seen. Bilateral nipple shadows are noted.  IMPRESSION: Small bilateral pleural effusions.  Mild vascular congestion.   Electronically Signed   By: [name] M.D.   On: 07/17/2014 13:49   Dg  Abd Portable 1v  07/23/2014   CLINICAL DATA:  Abdominal pain since last night.  EXAM: PORTABLE ABDOMEN - 1 VIEW  COMPARISON:  CT of 03/28/2007  FINDINGS: 2 supine views. Dense thoracoabdominal aortic atherosclerosis. No bowel distention. Moderate amount of descending colonic and rectosigmoid stool. Equivocal calcifications projecting over the left side of the abdomen. Favored to be vascular. Left hip arthroplasty and right proximal femoral fixation.  Small bilateral pleural effusions with adjacent airspace disease.  IMPRESSION: No bowel obstruction or free intraperitoneal air. Possible constipation.  Probable  vascular calcifications projecting over the left side of the abdomen. Cannot exclude left sided renal calculi. Of note, no stones are identified on the remote CT.  Small bilateral pleural effusions with adjacent airspace disease. Suboptimally evaluated.   Electronically Signed   By: Abigail Miyamoto M.D.   On: 07/23/2014 09:12     CBC  Recent Labs Lab 07/17/14 1235 07/18/14 0411  WBC 10.5 9.1  HGB 10.3* 8.9*  HCT 30.5* 27.3*  PLT 300 272  MCV 86.2 86.9  MCH 29.1 28.3  MCHC 33.8 32.6  RDW 14.3 14.5  LYMPHSABS 0.5*  --   MONOABS 0.9  --   EOSABS 0.0  --   BASOSABS 0.0  --     Chemistries   Recent Labs Lab 07/19/14 0455 07/20/14 0320 07/21/14 1158 07/22/14 1040 07/23/14 0425  NA 132* 133* 136 134* 136  K 4.1 3.7 3.7 3.6 3.2*  CL 99 101 100 99 96  CO2 22 22 25 23 25   GLUCOSE 120* 109* 120* 177* 187*  BUN 83* 83* 83* 88* 88*  CREATININE 3.18* 3.15* 2.91* 3.16* 2.99*  CALCIUM 8.1* 8.2* 8.7 8.9 9.3   ------------------------------------------------------------------------------------------------------------------ estimated creatinine clearance is 11.3 mL/min (by C-G formula based on Cr of 2.99). ------------------------------------------------------------------------------------------------------------------ No results for input(s): HGBA1C in the last 72  hours. ------------------------------------------------------------------------------------------------------------------ No results for input(s): CHOL, HDL, LDLCALC, TRIG, CHOLHDL, LDLDIRECT in the last 72 hours. ------------------------------------------------------------------------------------------------------------------ No results for input(s): TSH, T4TOTAL, T3FREE, THYROIDAB in the last 72 hours.  Invalid input(s): FREET3 ------------------------------------------------------------------------------------------------------------------ No results for input(s): VITAMINB12, FOLATE, FERRITIN, TIBC, IRON, RETICCTPCT in the last 72 hours.  Coagulation profile No results for input(s): INR, PROTIME in the last 168 hours.  No results for input(s): DDIMER in the last 72 hours.  Cardiac Enzymes No results for input(s): CKMB, TROPONINI, MYOGLOBIN in the last 168 hours.  Invalid input(s): CK ------------------------------------------------------------------------------------------------------------------ Invalid input(s): POCBNP     Time Spent in minutes   35   Lakelynn Severtson K M.D on 07/23/2014 at 11:21 AM  Between 7am to 7pm - Pager - [phone]  After 7pm go to [email or web address] - password [facility]  [facility]   Office  [phone]

## 2014-07-24 LAB — BASIC METABOLIC PANEL
Anion gap: 14 (ref 5–15)
BUN: 90 mg/dL — ABNORMAL HIGH (ref 6–23)
CO2: 24 mmol/L (ref 19–32)
CREATININE: 2.91 mg/dL — AB (ref 0.50–1.10)
Calcium: 9.1 mg/dL (ref 8.4–10.5)
Chloride: 101 mmol/L (ref 96–112)
GFR calc Af Amer: 16 mL/min — ABNORMAL LOW (ref >90)
GFR calc non Af Amer: 13 mL/min — ABNORMAL LOW (ref >90)
Glucose, Bld: 105 mg/dL — ABNORMAL HIGH (ref 70–99)
Potassium: 4.1 mmol/L (ref 3.5–5.1)
SODIUM: 139 mmol/L (ref 135–145)

## 2014-07-24 MED ORDER — METOLAZONE 2.5 MG PO TABS
2.5000 mg | ORAL_TABLET | Freq: Once | ORAL | Status: AC
  Administered 2014-07-24: 2.5 mg via ORAL
  Filled 2014-07-24: qty 1

## 2014-07-24 NOTE — Progress Notes (Signed)
Occupational Therapy Treatment Patient Details Name: Rachael [name] MRN: [id number] DOB: 1924-03-04 Today's Date: 07/24/2014    History of present illness Rachael [name] is a 81 y.o. female, with a past medical history of renal artery stenosis, refractory hypertension, diastolic heart failure, TIA, aortic stenosis. She presents to the emergency department with shortness of breath that started on Sunday. The patient reports reduced exercise tolerance, trembling, and her family friend in the room states that her face is flushed pink which is abnormal for her. She also complains of a nonproductive cough. The patient complains of an unquenchable thirst despite drinking large amounts of cold water recently.   OT comments  Pt making progress with functional goals and should continue with acute OT services to increase level of function and safety to return home. Pt feeling better today, however reports that she only ate very little and felt like she had to have a BM or vomit although she did neither of the two during this session. Recommend pt have sup at home for higher level ADLs and cognitive funcitons due to weakness and fatiguing easily  Follow Up Recommendations  Home health OT;Supervision/Assistance - 24 hour    Equipment Recommendations   rollater?    Recommendations for Other Services      Precautions / Restrictions Precautions Precautions: Fall Precaution Comments: desats on RA Restrictions Weight Bearing Restrictions: No       Mobility Bed Mobility Overal bed mobility: Independent             General bed mobility comments: Pt up in recliner.  Transfers Overall transfer level: Needs assistance Equipment used: Rolling walker (2 wheeled);None Transfers: Sit to/from Stand Sit to Stand: Min guard Stand pivot transfers: Min guard (from bed to [facility])       General transfer comment: VCs for hand placement and min guard for stability     Balance Overall balance  assessment: Needs assistance Sitting-balance support: No upper extremity supported;Feet supported Sitting balance-Leahy Scale: Good     Standing balance support: No upper extremity supported;During functional activity Standing balance-Leahy Scale: Fair                     ADL       Grooming: Supervision/safety;Set up;Wash/dry face;Wash/dry hands           Upper Body Dressing : Set up;Sitting;Supervision/safety       Toilet Transfer: Min guard;Ambulation;RW;Grab bars Toilet Transfer Details (indicate cue type and reason): VCs for hand placement and min guard for stability. cues to use grab bars during stand - sit  Toileting- Clothing Manipulation and Hygiene: Sit to/from stand;Supervision/safety       Functional mobility during ADLs: Min guard General ADL Comments: pt able to stand at toilet to manage clothing, change underwear, doff one housecoat and doff another one with sup      Vision  no change in baseline                              Cognition   Behavior During Therapy: [facility] for tasks assessed/performed Overall Cognitive Status: Within Functional Limits for tasks assessed                       Extremity/Trunk Assessment   generalized weakness                        General Comments  pt  pleasant and cooperative    Pertinent Vitals/ Pain       Pain Assessment: No/denies pain  Home Living  home alone                                        Prior Functioning/Environment  Independent           Frequency Min 2X/week     Progress Toward Goals  OT Goals(current goals can now be found in the care plan section)  Progress towards OT goals: Progressing toward goals  Acute Rehab OT Goals Patient Stated Goal: home  Plan Discharge plan remains appropriate                     End of Session Equipment Utilized During Treatment: Rolling walker;Oxygen   Activity Tolerance Patient tolerated  treatment well   Patient Left in chair;with call bell/phone within reach   Nurse Communication          Time: 2878-6767 OT Time Calculation (min): 25 min  Charges: OT General Charges $OT Visit: 1 Procedure OT Treatments $Self Care/Home Management : 8-22 mins $Therapeutic Activity: 8-22 mins  Britt Bottom 07/24/2014, 11:49 AM

## 2014-07-24 NOTE — Progress Notes (Signed)
Patient ID: Rachael [name], female   DOB: 10-Dec-1923, 81 y.o.   MRN: [id number] S:feels better today O:BP 160/62 mmHg  Pulse 61  Temp(Src) 98.4 F (36.9 C) (Oral)  Resp 18  Ht 5' 8.75" (1.746 m)  Wt 53.842 kg (118 lb 11.2 oz)  BMI 17.66 kg/m2  SpO2 98%  Intake/Output Summary (Last 24 hours) at 07/24/14 1050 Last data filed at 07/24/14 0850  Gross per 24 hour  Intake    480 ml  Output   1375 ml  Net   -895 ml   Intake/Output: I/O last 3 completed shifts: In: 960 [P.O.:960] Out: 1704 [Urine:1700; Stool:4]  Intake/Output this shift:  Total I/O In: -  Out: 300 [Urine:300] Weight change: -1.406 kg (-3 lb 1.6 oz) BOF:BPZWC elderly WF in nad CVS:III/VI SEM at LUSB, no rub Resp:decreased BS at bases Abd:+BS, distended, no guarding/rebound HEN:IDPOEUM edema   Recent Labs Lab 07/18/14 0411 07/19/14 0455 07/20/14 0320 07/21/14 1158 07/22/14 1040 07/23/14 0425 07/24/14 0520  NA 136 132* 133* 136 134* 136 139  K 4.4 4.1 3.7 3.7 3.6 3.2* 4.1  CL 99 99 101 100 99 96 101  CO2 21 22 22 25 23 25 24   GLUCOSE 121* 120* 109* 120* 177* 187* 105*  BUN 79* 83* 83* 83* 88* 88* 90*  CREATININE 3.16* 3.18* 3.15* 2.91* 3.16* 2.99* 2.91*  ALBUMIN  --   --   --  3.3*  --   --   --   CALCIUM 8.6 8.1* 8.2* 8.7 8.9 9.3 9.1  PHOS  --   --   --  5.6*  --   --   --    Liver Function Tests:  Recent Labs Lab 07/21/14 1158  ALBUMIN 3.3*   No results for input(s): LIPASE, AMYLASE in the last 168 hours. No results for input(s): AMMONIA in the last 168 hours. CBC:  Recent Labs Lab 07/17/14 1235 07/18/14 0411  WBC 10.5 9.1  NEUTROABS 9.0*  --   HGB 10.3* 8.9*  HCT 30.5* 27.3*  MCV 86.2 86.9  PLT 300 272   Cardiac Enzymes: No results for input(s): CKTOTAL, CKMB, CKMBINDEX, TROPONINI in the last 168 hours. CBG: No results for input(s): GLUCAP in the last 168 hours.  Iron Studies: No results for input(s): IRON, TIBC, TRANSFERRIN, FERRITIN in the last 72  hours. Studies/Results: Dg Abd Portable 1v  07/23/2014   CLINICAL DATA:  Abdominal pain since last night.  EXAM: PORTABLE ABDOMEN - 1 VIEW  COMPARISON:  CT of 03/28/2007  FINDINGS: 2 supine views. Dense thoracoabdominal aortic atherosclerosis. No bowel distention. Moderate amount of descending colonic and rectosigmoid stool. Equivocal calcifications projecting over the left side of the abdomen. Favored to be vascular. Left hip arthroplasty and right proximal femoral fixation.  Small bilateral pleural effusions with adjacent airspace disease.  IMPRESSION: No bowel obstruction or free intraperitoneal air. Possible constipation.  Probable vascular calcifications projecting over the left side of the abdomen. Cannot exclude left sided renal calculi. Of note, no stones are identified on the remote CT.  Small bilateral pleural effusions with adjacent airspace disease. Suboptimally evaluated.   Electronically Signed   By: Abigail Miyamoto M.D.   On: 07/23/2014 09:12   . amLODipine  10 mg Oral Daily  . aspirin EC  81 mg Oral Daily  . atorvastatin  20 mg Oral Daily  . bisacodyl  10 mg Rectal Daily  . bisoprolol  10 mg Oral Daily  . cholecalciferol  1,000 Units Oral q morning -  10a  . cloNIDine  0.1 mg Oral TID  . docusate sodium  200 mg Oral BID  . furosemide  80 mg Intravenous TID  . heparin  5,000 Units Subcutaneous 3 times per day  . hydrALAZINE  100 mg Oral TID  . levothyroxine  175 mcg Oral QAC breakfast  . multivitamin with minerals  1 tablet Oral q morning - 10a  . multivitamin-lutein  1 capsule Oral Daily  . pantoprazole  40 mg Oral BID AC  . polyethylene glycol  17 g Oral Q6H  . polyvinyl alcohol  1 drop Both Eyes TID  . sodium chloride  1 drop Both Eyes QHS  . sodium chloride  3 mL Intravenous Q12H    BMET    Component Value Date/Time   NA 139 07/24/2014 0520   K 4.1 07/24/2014 0520   CL 101 07/24/2014 0520   CO2 24 07/24/2014 0520   GLUCOSE 105* 07/24/2014 0520   BUN 90* 07/24/2014 0520    CREATININE 2.91* 07/24/2014 0520   CREATININE 2.01* 07/04/2014 1217   CALCIUM 9.1 07/24/2014 0520   GFRNONAA 13* 07/24/2014 0520   GFRNONAA 22* 07/04/2014 1217   GFRAA 16* 07/24/2014 0520   GFRAA 25* 07/04/2014 1217   CBC    Component Value Date/Time   WBC 9.1 07/18/2014 0411   RBC 3.14* 07/18/2014 0411   HGB 8.9* 07/18/2014 0411   HCT 27.3* 07/18/2014 0411   PLT 272 07/18/2014 0411   MCV 86.9 07/18/2014 0411   MCH 28.3 07/18/2014 0411   MCHC 32.6 07/18/2014 0411   RDW 14.5 07/18/2014 0411   LYMPHSABS 0.5* 07/17/2014 1235   MONOABS 0.9 07/17/2014 1235   EOSABS 0.0 07/17/2014 1235   BASOSABS 0.0 07/17/2014 1235     81 yo with history of CKD, HTN, PAD including dopplers recently suggestive of "> 60 % right renal artery stenosis", < 60% left renal artery stenosis, significant atherosclerosis of the distal aorta, chronic diastolic CHF, and CKD stage 4 at baseline.  Assessment/Plan:  1. AKI/CKD- in setting of decompensated CHF +/- R RAS. Pt is a poor dialysis candidate given her advanced age and protein malnutrition as well as her debilitated state. 1. Recommend palliative care consult to discuss goals/limits of care. 2. Acute on chronic diastolic CHF- diuresing slowly. 1. Continue with zaroxolyn  3. Possible right renal artery stenosis- given severe atherosclerosis of distal aorta, high risk for intervention and would treat medically 1. Would only consider intervention if she had critical bilateral RAS (but that is doubtful in light of improving UOP, Scr, and BP). 4. Stool impaction- plan per primary svc 5. Anemia of chronic disease- will check iron stores and ESA (pending palliative care eval) 6. HTN- stable 7. Anorexia/FTT- cont with protein supplements, consider megace 8. Dispo- agree with palliative care consult.  [place]

## 2014-07-24 NOTE — Progress Notes (Signed)
Physical Therapy Treatment Patient Details Name: Rachael [name] MRN: [id number] DOB: June 18, 1923 Today's Date: 07/24/2014    History of Present Illness Rachael [name] is an 81 year old Caucasian female with history of diastolic diastolic dysfunction, chronic kidney disease stage V, early controlled hypertension, who was admitted to the hospital for CHF.    PT Comments    Patient progressing with ambulation on 2 liters 94% with activity, tolerated well, attempted room air, dropped to 91% with increased fatigue. Patient affect changed upon return to room, patient reports feeling depressed.  Discussed safety concerns and recommendation for HHPT and initial supervision upon discharge. Will continue to see as indicated and progress as tolerated.    Follow Up Recommendations  Home health PT, initial Supervision recommended      Equipment Recommendations  None recommended by PT    Recommendations for Other Services       Precautions / Restrictions Precautions Precautions: Fall Precaution Comments: desats on RA Restrictions Weight Bearing Restrictions: No    Mobility  Bed Mobility Overal bed mobility: Independent             General bed mobility comments: Pt received in recliner.  Transfers Overall transfer level: Needs assistance Equipment used: Rolling walker (2 wheeled);None Transfers: Sit to/from [employer] to Stand: Min guard Stand pivot transfers: Min guard (from bed to [facility])       General transfer comment: VCs for hand placement and min guard for stability   Ambulation/Gait Ambulation/Gait assistance: Min guard Ambulation Distance (Feet): 210 Feet (2 standing rest breaks) Assistive device: Rolling walker (2 wheeled) Gait Pattern/deviations: Step-through pattern;Decreased stride length Gait velocity: decreased Gait velocity interpretation: at or above normal speed for age/gender General Gait Details: ambulated on 2 liters [name], Saturations  94%, on room air saturations dropped to 91%; increased fatigue noted with ambulation.     Stairs            Wheelchair Mobility    Modified Rankin (Stroke Patients Only)       Balance Overall balance assessment: Needs assistance Sitting-balance support: Bilateral upper extremity supported Sitting balance-Leahy Scale: Good     Standing balance support: No upper extremity supported Standing balance-Leahy Scale: Fair                      Cognition Arousal/Alertness: Awake/alert Behavior During Therapy: WFL for tasks assessed/performed Overall Cognitive Status: Within Functional Limits for tasks assessed                      Exercises      General Comments  Patient was able to perform self care tasks in room with min guard. Patient was not able to put on her own socks and shoes reporting that at home, she has certain furniture that she can sit down on to don/doff her socks.       Pertinent Vitals/Pain Pain Assessment: No/denies pain    Home Living                      Prior Function            PT Goals (current goals can now be found in the care plan section) Acute Rehab PT Goals Patient Stated Goal: home PT Goal Formulation: With patient Time For Goal Achievement: 08/01/14 Potential to Achieve Goals: Good Progress towards PT goals: Progressing toward goals    Frequency  Min 3X/week    PT Plan Current plan remains  appropriate    Co-evaluation             End of Session Equipment Utilized During Treatment: Gait belt Activity Tolerance: Patient limited by fatigue Patient left: in chair;with call bell/phone within reach     Time: 0835-0900 PT Time Calculation (min) (ACUTE ONLY): 25 min  Charges:  $Gait Training: 8-22 mins $Therapeutic Activity: 8-22 mins                    G CodesDuncan [name] 2014/07/26, 9:06 AM Alben Deeds, PT DPT  [phone]

## 2014-07-24 NOTE — Progress Notes (Signed)
Patient Demographics  Rachael [name], is a 81 y.o. female, DOB - 1924/01/16, UUE:[id number]  Admit date - 07/17/2014   Admitting Physician No admitting provider for patient encounter.  Outpatient Primary MD for the patient is Alesia Richards, MD  LOS - 7   Chief Complaint  Patient presents with  . Shortness of Breath      Brief summary  81 year old Caucasian female with history of diastolic diastolic dysfunction, chronic kidney disease stage V, early controlled hypertension, who was admitted to the hospital for CHF, diuresis has been in challenge due to poor renal function, cardiology renal both following. Her current issue is severe constipation. Bowel regimen has been initiated. She is still 2-3 days away from discharge as her diuresis has not been adequate so far. She is still short of breath has edema and requiring oxygen.    Subjective:   Rachael [name] today has, No headache, No chest pain, today has constipation and generalized abdominal pain - No Nausea, No new weakness tingling or numbness, No Cough - continues to have shortness of breath.  Assessment & Plan    1. Acute respiratory, fluid overload due to acute on chronic diastolic CHF EF 91% on recent echo. Does have evidence of fluid overload, stopped IV fluids, on IV Lasix and Zaroxolyn adjusted by nephrology, continue beta blocker.  Continue supportive care and monitor. Try to titrate off O2. Diuresis has been a challenge due to poor baseline renal function. Cardiology and renal both following.  Filed Weights   07/22/14 0604 07/23/14 0442 07/24/14 0449  Weight: 55.067 kg (121 lb 6.4 oz) 55.248 kg (121 lb 12.8 oz) 53.842 kg (118 lb 11.2 oz)     2. ARF and the kidney disease stage IV. Baseline creatinine around 2, diurese and  monitor. Likely due to CHF decompensation. Avoid nephrotoxins. Renal following. - Possible renal artery stenosis, high risk for intervention . - Not a candidate for hemodialysis, requested palliative care consult.   3. Essential hypertension. Continue home medications unchanged at this point.   4. Hyperglycemia in the ER. Likely due to stress. Recent A1c was 5.6.   5. Constipation with abdominal pain. Abdominal x-ray shows no obstruction, manual disimpaction/enema/bowel regimen all initiated.   6. Hypokalemia. Replaced. Recheck in the morning   7. Hypothyroidism. Continue home dose Synthroid   8. Dyslipidemia. On statin      Code Status: Full  Family Communication: none at bedside. Disposition Plan: To be decided   Procedures    Consults   Cardiology. nephrology   Medications  Scheduled Meds: . amLODipine  10 mg Oral Daily  . aspirin EC  81 mg Oral Daily  . atorvastatin  20 mg Oral Daily  . bisacodyl  10 mg Rectal Daily  . bisoprolol  10 mg Oral Daily  . cholecalciferol  1,000 Units Oral q morning - 10a  . cloNIDine  0.1 mg Oral TID  . docusate sodium  200 mg Oral BID  . furosemide  80 mg Intravenous TID  . heparin  5,000 Units Subcutaneous 3 times per day  . hydrALAZINE  100 mg Oral TID  . levothyroxine  175 mcg Oral QAC breakfast  . multivitamin with minerals  1 tablet Oral q morning - 10a  .  multivitamin-lutein  1 capsule Oral Daily  . pantoprazole  40 mg Oral BID AC  . polyvinyl alcohol  1 drop Both Eyes TID  . sodium chloride  1 drop Both Eyes QHS  . sodium chloride  3 mL Intravenous Q12H   Continuous Infusions:  PRN Meds:.acetaminophen **OR** acetaminophen, albuterol, hydrALAZINE, morphine injection, ondansetron **OR** ondansetron (ZOFRAN) IV, sodium chloride  DVT Prophylaxis   Heparin  Lab Results  Component Value Date   PLT 272 07/18/2014    Antibiotics    Anti-infectives    None          Objective:   Filed Vitals:    07/23/14 2047 07/24/14 0449 07/24/14 1145 07/24/14 1330  BP: 150/58 160/62 158/69 174/50  Pulse: 56 61 97 66  Temp: 98.2 F (36.8 C) 98.4 F (36.9 C) 98 F (36.7 C) 98.2 F (36.8 C)  TempSrc: Oral Oral Oral Oral  Resp:  18  20  Height:      Weight:  53.842 kg (118 lb 11.2 oz)    SpO2: 96% 98% 97% 96%    Wt Readings from Last 3 Encounters:  07/24/14 53.842 kg (118 lb 11.2 oz)  07/15/14 55.611 kg (122 lb 9.6 oz)  07/11/14 55.883 kg (123 lb 3.2 oz)     Intake/Output Summary (Last 24 hours) at 07/24/14 1515 Last data filed at 07/24/14 1400  Gross per 24 hour  Intake    960 ml  Output   1876 ml  Net   -916 ml     Physical Exam  Awake Alert, Oriented X 3, No new F.N deficits, Normal affect [place].AT,PERRAL Supple Neck,No JVD, No cervical lymphadenopathy appriciated.  Symmetrical Chest wall movement, Good air movement bilaterally, rales RRR,No Gallops,Rubs or new Murmurs, No Parasternal Heave +ve B.Sounds, Abd Soft, No tenderness, No organomegaly appriciated, No rebound - guarding or rigidity. No Cyanosis, Clubbing , trace edema, No new Rash or bruise     Data Review   Micro Results Recent Results (from the past 240 hour(s))  Urine culture     Status: None   Collection Time: 07/17/14  6:49 PM  Result Value Ref Range Status   Specimen Description URINE, CLEAN CATCH  Final   Special Requests NONE  Final   Colony Count NO GROWTH Performed at [facility]   Final   Culture NO GROWTH Performed at [facility]   Final   Report Status 07/18/2014 FINAL  Final    Radiology Reports Dg Chest 2 View  07/17/2014   CLINICAL DATA:  Worsening shortness of breath over 2 days  EXAM: CHEST  2 VIEW  COMPARISON:  11/29/2013  FINDINGS: Cardiac shadow is within normal limits. Mild vascular congestion is seen. Small bilateral pleural effusions are noted but improved from the prior exam. Bibasilar scarring is seen. Bilateral nipple shadows are noted.  IMPRESSION: Small  bilateral pleural effusions.  Mild vascular congestion.   Electronically Signed   By: [name] M.D.   On: 07/17/2014 13:49   Dg Abd Portable 1v  07/23/2014   CLINICAL DATA:  Abdominal pain since last night.  EXAM: PORTABLE ABDOMEN - 1 VIEW  COMPARISON:  CT of 03/28/2007  FINDINGS: 2 supine views. Dense thoracoabdominal aortic atherosclerosis. No bowel distention. Moderate amount of descending colonic and rectosigmoid stool. Equivocal calcifications projecting over the left side of the abdomen. Favored to be vascular. Left hip arthroplasty and right proximal femoral fixation.  Small bilateral pleural effusions with adjacent airspace disease.  IMPRESSION: No bowel  obstruction or free intraperitoneal air. Possible constipation.  Probable vascular calcifications projecting over the left side of the abdomen. Cannot exclude left sided renal calculi. Of note, no stones are identified on the remote CT.  Small bilateral pleural effusions with adjacent airspace disease. Suboptimally evaluated.   Electronically Signed   By: Abigail Miyamoto M.D.   On: 07/23/2014 09:12     CBC  Recent Labs Lab 07/18/14 0411  WBC 9.1  HGB 8.9*  HCT 27.3*  PLT 272  MCV 86.9  MCH 28.3  MCHC 32.6  RDW 14.5    Chemistries   Recent Labs Lab 07/20/14 0320 07/21/14 1158 07/22/14 1040 07/23/14 0425 07/24/14 0520  NA 133* 136 134* 136 139  K 3.7 3.7 3.6 3.2* 4.1  CL 101 100 99 96 101  CO2 22 25 23 25 24   GLUCOSE 109* 120* 177* 187* 105*  BUN 83* 83* 88* 88* 90*  CREATININE 3.15* 2.91* 3.16* 2.99* 2.91*  CALCIUM 8.2* 8.7 8.9 9.3 9.1   ------------------------------------------------------------------------------------------------------------------ estimated creatinine clearance is 11.3 mL/min (by C-G formula based on Cr of 2.91). ------------------------------------------------------------------------------------------------------------------ No results for input(s): HGBA1C in the last 72  hours. ------------------------------------------------------------------------------------------------------------------ No results for input(s): CHOL, HDL, LDLCALC, TRIG, CHOLHDL, LDLDIRECT in the last 72 hours. ------------------------------------------------------------------------------------------------------------------ No results for input(s): TSH, T4TOTAL, T3FREE, THYROIDAB in the last 72 hours.  Invalid input(s): FREET3 ------------------------------------------------------------------------------------------------------------------ No results for input(s): VITAMINB12, FOLATE, FERRITIN, TIBC, IRON, RETICCTPCT in the last 72 hours.  Coagulation profile No results for input(s): INR, PROTIME in the last 168 hours.  No results for input(s): DDIMER in the last 72 hours.  Cardiac Enzymes No results for input(s): CKMB, TROPONINI, MYOGLOBIN in the last 168 hours.  Invalid input(s): CK ------------------------------------------------------------------------------------------------------------------ Invalid input(s): POCBNP     Time Spent in minutes   35 minutes.   [facility], DAWOOD M.D on 07/24/2014 at 3:15 PM  Between 7am to 7pm - Pager - [phone]  After 7pm go to [email or web address] - password [facility]  [facility]   Office  [phone]

## 2014-07-24 NOTE — Progress Notes (Signed)
Patient ID: Rachael [name], female   DOB: [birth year], 81 y.o.   MRN: [id number]   SUBJECTIVE:  UOP a bit better yesterday with metolazone.  Walked with PT.  Had BM, feels much better with resolution of abdominal pain.     Scheduled Meds: . amLODipine  10 mg Oral Daily  . aspirin EC  81 mg Oral Daily  . atorvastatin  20 mg Oral Daily  . bisacodyl  10 mg Rectal Daily  . bisoprolol  10 mg Oral Daily  . cholecalciferol  1,000 Units Oral q morning - 10a  . cloNIDine  0.1 mg Oral TID  . docusate sodium  200 mg Oral BID  . furosemide  80 mg Intravenous TID  . heparin  5,000 Units Subcutaneous 3 times per day  . hydrALAZINE  100 mg Oral TID  . levothyroxine  175 mcg Oral QAC breakfast  . metolazone  2.5 mg Oral Once  . multivitamin with minerals  1 tablet Oral q morning - 10a  . multivitamin-lutein  1 capsule Oral Daily  . pantoprazole  40 mg Oral BID AC  . polyethylene glycol  17 g Oral Q6H  . polyvinyl alcohol  1 drop Both Eyes TID  . sodium chloride  1 drop Both Eyes QHS  . sodium chloride  3 mL Intravenous Q12H   Continuous Infusions:  PRN Meds:.acetaminophen **OR** acetaminophen, albuterol, hydrALAZINE, morphine injection, ondansetron **OR** ondansetron (ZOFRAN) IV, sodium chloride    Filed Vitals:   07/23/14 1513 07/23/14 2028 07/23/14 2047 07/24/14 0449  BP: 162/68 145/81 150/58 160/62  Pulse: 68 67 56 61  Temp: 97.8 F (36.6 C)  98.2 F (36.8 C) 98.4 F (36.9 C)  TempSrc: Oral  Oral Oral  Resp: 20   18  Height:      Weight:    118 lb 11.2 oz (53.842 kg)  SpO2: 94%  96% 98%    Intake/Output Summary (Last 24 hours) at 07/24/14 1103 Last data filed at 07/24/14 1055  Gross per 24 hour  Intake    960 ml  Output   1375 ml  Net   -415 ml    LABS: Basic Metabolic Panel:  Recent Labs  07/21/14 1158  07/23/14 0425 07/24/14 0520  NA 136  < > 136 139  K 3.7  < > 3.2* 4.1  CL 100  < > 96 101  CO2 25  < > 25 24  GLUCOSE 120*  < > 187* 105*  BUN 83*  < > 88* 90*   CREATININE 2.91*  < > 2.99* 2.91*  CALCIUM 8.7  < > 9.3 9.1  PHOS 5.6*  --   --   --   < > = values in this interval not displayed. Liver Function Tests:  Recent Labs  07/21/14 1158  ALBUMIN 3.3*   No results for input(s): LIPASE, AMYLASE in the last 72 hours. CBC: No results for input(s): WBC, NEUTROABS, HGB, HCT, MCV, PLT in the last 72 hours. Cardiac Enzymes: No results for input(s): CKTOTAL, CKMB, CKMBINDEX, TROPONINI in the last 72 hours. BNP: Invalid input(s): POCBNP D-Dimer: No results for input(s): DDIMER in the last 72 hours. Hemoglobin A1C: No results for input(s): HGBA1C in the last 72 hours. Fasting Lipid Panel: No results for input(s): CHOL, HDL, LDLCALC, TRIG, CHOLHDL, LDLDIRECT in the last 72 hours. Thyroid Function Tests: No results for input(s): TSH, T4TOTAL, T3FREE, THYROIDAB in the last 72 hours.  Invalid input(s): FREET3 Anemia Panel: No results for input(s): VITAMINB12, FOLATE,  FERRITIN, TIBC, IRON, RETICCTPCT in the last 72 hours.  RADIOLOGY: Dg Chest 2 View  07/17/2014   CLINICAL DATA:  Worsening shortness of breath over 2 days  EXAM: CHEST  2 VIEW  COMPARISON:  11/29/2013  FINDINGS: Cardiac shadow is within normal limits. Mild vascular congestion is seen. Small bilateral pleural effusions are noted but improved from the prior exam. Bibasilar scarring is seen. Bilateral nipple shadows are noted.  IMPRESSION: Small bilateral pleural effusions.  Mild vascular congestion.   Electronically Signed   By: [name] M.D.   On: 07/17/2014 13:49   Dg Abd Portable 1v  07/23/2014   CLINICAL DATA:  Abdominal pain since last night.  EXAM: PORTABLE ABDOMEN - 1 VIEW  COMPARISON:  CT of 03/28/2007  FINDINGS: 2 supine views. Dense thoracoabdominal aortic atherosclerosis. No bowel distention. Moderate amount of descending colonic and rectosigmoid stool. Equivocal calcifications projecting over the left side of the abdomen. Favored to be vascular. Left hip arthroplasty and  right proximal femoral fixation.  Small bilateral pleural effusions with adjacent airspace disease.  IMPRESSION: No bowel obstruction or free intraperitoneal air. Possible constipation.  Probable vascular calcifications projecting over the left side of the abdomen. Cannot exclude left sided renal calculi. Of note, no stones are identified on the remote CT.  Small bilateral pleural effusions with adjacent airspace disease. Suboptimally evaluated.   Electronically Signed   By: Abigail Miyamoto M.D.   On: 07/23/2014 09:12    PHYSICAL EXAM General: NAD Neck: JVP ~10 cm, no thyromegaly or thyroid nodule.  Lungs: Clear to auscultation bilaterally with normal respiratory effort. CV: Nondisplaced PMI.  Heart regular S1/S2, no S3/S4, 2/6 SEM RUSB.  1+ edema at ankles bilaterally.  Abdomen: Soft, nontender, no hepatosplenomegaly, no distention.  Neurologic: Alert and oriented x 3.  Psych: Normal affect. Extremities: No clubbing or cyanosis.  Skin: Steri strips upper back.   TELEMETRY: Reviewed telemetry pt in NSR  ASSESSMENT AND PLAN: 81 yo with history of CKD, renovascular HTN, PAD including bilateral renal artery stenosis, and chronic diastolic CHF was admitted with dyspnea and AKI.  1. AKI on CKD: Difficult situation. Had been seeing PCP frequently to work on lowering her BP, which had been quite high and uncontrolled. I suspect she has renovascular hypertension with severe right renal artery stenosis and borderline significant left renal artery stenosis. She was admitted with rise in creatinine from 1.7 => 3.Concern that  BP lowering in the face of possible bilateral significant renal artery stenosis worsened renal function acutely. Unfortunately, she is also volume overloaded currently (Got IV fluid initially) with creatinine still elevated. Diuresis sluggish, somewhat better with metolazone.  Discussed situation with Drs Marval Regal and Fletcher Anon today.  Only reason to intervene on renal artery stenosis  would be if we had hemodynamically significant bilateral renal artery stenosis that is causing renal dysfunction.  However, we only know that the artery to one side has definite hemodynamically significant stenosis.  Given high risk of intervention, would hold off.  Dr. Fletcher Anon to review as well.  2. HTN: Poor control historically with possible renovascular HTN. See discussion above about concern that aggressive BP control will worsen GFR with possible bilateral RAS. Would aim to let SBP run around 150. BP currently stable.  Now off minoxidil. 3. Acute on chronic diastolic CHF: This is difficult. She is volume overloaded with significant renal dysfunction. She has been started on Lasix 80 mg IV every 8 hrs. Will give another dose of metolazone this morning.   Shima Compere  Jayin Derousse 07/24/2014 11:03 AM

## 2014-07-25 ENCOUNTER — Inpatient Hospital Stay (HOSPITAL_COMMUNITY): Payer: Medicare Other

## 2014-07-25 ENCOUNTER — Encounter (HOSPITAL_COMMUNITY): Payer: Self-pay | Admitting: Physician Assistant

## 2014-07-25 ENCOUNTER — Encounter (HOSPITAL_COMMUNITY): Payer: Self-pay | Admitting: Certified Registered Nurse Anesthetist

## 2014-07-25 ENCOUNTER — Encounter (HOSPITAL_COMMUNITY)
Admission: EM | Disposition: A | Discharge status: Hospice | Payer: Self-pay | Source: Home / Self Care | Attending: Internal Medicine

## 2014-07-25 DIAGNOSIS — F411 Generalized anxiety disorder: Secondary | ICD-10-CM

## 2014-07-25 DIAGNOSIS — K5900 Constipation, unspecified: Secondary | ICD-10-CM | POA: Insufficient documentation

## 2014-07-25 DIAGNOSIS — Z515 Encounter for palliative care: Secondary | ICD-10-CM | POA: Insufficient documentation

## 2014-07-25 DIAGNOSIS — R1314 Dysphagia, pharyngoesophageal phase: Secondary | ICD-10-CM

## 2014-07-25 LAB — CBC
HCT: 29.8 % — ABNORMAL LOW (ref 36.0–46.0)
Hemoglobin: 9.7 g/dL — ABNORMAL LOW (ref 12.0–15.0)
MCH: 29.2 pg (ref 26.0–34.0)
MCHC: 32.6 g/dL (ref 30.0–36.0)
MCV: 89.8 fL (ref 78.0–100.0)
PLATELETS: 363 10*3/uL (ref 150–400)
RBC: 3.32 MIL/uL — AB (ref 3.87–5.11)
RDW: 14.6 % (ref 11.5–15.5)
WBC: 11.8 10*3/uL — ABNORMAL HIGH (ref 4.0–10.5)

## 2014-07-25 LAB — BASIC METABOLIC PANEL
Anion gap: 11 (ref 5–15)
BUN: 93 mg/dL — AB (ref 6–23)
CO2: 31 mmol/L (ref 19–32)
Calcium: 8.9 mg/dL (ref 8.4–10.5)
Chloride: 96 mmol/L (ref 96–112)
Creatinine, Ser: 2.73 mg/dL — ABNORMAL HIGH (ref 0.50–1.10)
GFR calc Af Amer: 17 mL/min — ABNORMAL LOW (ref >90)
GFR, EST NON AFRICAN AMERICAN: 14 mL/min — AB (ref >90)
GLUCOSE: 117 mg/dL — AB (ref 70–99)
POTASSIUM: 2.8 mmol/L — AB (ref 3.5–5.1)
Sodium: 138 mmol/L (ref 135–145)

## 2014-07-25 LAB — POCT I-STAT 4, (NA,K, GLUC, HGB,HCT)
GLUCOSE: 141 mg/dL — AB (ref 70–99)
HCT: 34 % — ABNORMAL LOW (ref 36.0–46.0)
Hemoglobin: 11.6 g/dL — ABNORMAL LOW (ref 12.0–15.0)
POTASSIUM: 2.6 mmol/L — AB (ref 3.5–5.1)
Sodium: 137 mmol/L (ref 135–145)

## 2014-07-25 SURGERY — CANCELLED PROCEDURE

## 2014-07-25 MED ORDER — LACTATED RINGERS IV SOLN
INTRAVENOUS | Status: DC
  Administered 2014-07-25: 1000 mL via INTRAVENOUS

## 2014-07-25 MED ORDER — POTASSIUM CHLORIDE 10 MEQ/100ML IV SOLN
10.0000 meq | INTRAVENOUS | Status: AC
  Administered 2014-07-26 (×4): 10 meq via INTRAVENOUS
  Filled 2014-07-25 (×4): qty 100

## 2014-07-25 MED ORDER — POTASSIUM CHLORIDE 20 MEQ/15ML (10%) PO SOLN
40.0000 meq | ORAL | Status: AC
  Administered 2014-07-25: 40 meq via ORAL
  Filled 2014-07-25 (×4): qty 30

## 2014-07-25 MED ORDER — ALPRAZOLAM 0.25 MG PO TABS
0.2500 mg | ORAL_TABLET | Freq: Three times a day (TID) | ORAL | Status: DC | PRN
  Administered 2014-07-25 – 2014-07-27 (×3): 0.25 mg via ORAL
  Filled 2014-07-25 (×3): qty 1

## 2014-07-25 MED ORDER — FUROSEMIDE 80 MG PO TABS
80.0000 mg | ORAL_TABLET | Freq: Three times a day (TID) | ORAL | Status: DC
  Administered 2014-07-25 – 2014-07-28 (×10): 80 mg via ORAL
  Filled 2014-07-25 (×13): qty 1

## 2014-07-25 MED ORDER — POTASSIUM CHLORIDE CRYS ER 20 MEQ PO TBCR
40.0000 meq | EXTENDED_RELEASE_TABLET | ORAL | Status: DC
  Administered 2014-07-25: 40 meq via ORAL
  Filled 2014-07-25 (×2): qty 2

## 2014-07-25 MED ORDER — SODIUM CHLORIDE 0.9 % IV SOLN: INTRAVENOUS | Status: DC

## 2014-07-25 MED ORDER — HYDRALAZINE HCL 20 MG/ML IJ SOLN
10.0000 mg | INTRAMUSCULAR | Status: DC | PRN
  Administered 2014-07-29: 10 mg via INTRAVENOUS
  Filled 2014-07-25 (×2): qty 1

## 2014-07-25 NOTE — Progress Notes (Signed)
Patient ID: Rachael [name], female   DOB: June 16, 1923, 81 y.o.   MRN: [id number]   SUBJECTIVE:  UOP better yesterday, creatinine incrementally lower.  This morning ate [place] toast and got it stuck in her esophagus.  Very anxious.  Going for EGD soon.  Unable to to get po meds for now.   Scheduled Meds: . amLODipine  10 mg Oral Daily  . aspirin EC  81 mg Oral Daily  . atorvastatin  20 mg Oral Daily  . bisacodyl  10 mg Rectal Daily  . bisoprolol  10 mg Oral Daily  . cholecalciferol  1,000 Units Oral q morning - 10a  . cloNIDine  0.1 mg Oral TID  . docusate sodium  200 mg Oral BID  . furosemide  80 mg Intravenous TID  . furosemide  80 mg Oral TID  . heparin  5,000 Units Subcutaneous 3 times per day  . hydrALAZINE  100 mg Oral TID  . levothyroxine  175 mcg Oral QAC breakfast  . multivitamin with minerals  1 tablet Oral q morning - 10a  . multivitamin-lutein  1 capsule Oral Daily  . pantoprazole  40 mg Oral BID AC  . polyvinyl alcohol  1 drop Both Eyes TID  . potassium chloride  40 mEq Oral Q3H  . sodium chloride  1 drop Both Eyes QHS  . sodium chloride  3 mL Intravenous Q12H   Continuous Infusions:  PRN Meds:.acetaminophen **OR** acetaminophen, albuterol, hydrALAZINE, morphine injection, ondansetron **OR** ondansetron (ZOFRAN) IV, sodium chloride    Filed Vitals:   07/24/14 2100 07/25/14 0450 07/25/14 0803 07/25/14 0900  BP: 156/54 165/78 162/51 156/38  Pulse:  69 76 70  Temp: 97.8 F (36.6 C) 97.7 F (36.5 C)  97.7 F (36.5 C)  TempSrc: Oral Oral  Oral  Resp: 20 20  16   Height:      Weight:  114 lb 8 oz (51.937 kg)    SpO2: 96% 97%  99%    Intake/Output Summary (Last 24 hours) at 07/25/14 1300 Last data filed at 07/25/14 1131  Gross per 24 hour  Intake   1120 ml  Output   2727 ml  Net  -1607 ml    LABS: Basic Metabolic Panel:  Recent Labs  07/24/14 0520 07/25/14 0500  NA 139 138  K 4.1 2.8*  CL 101 96  CO2 24 31  GLUCOSE 105* 117*  BUN 90* 93*    CREATININE 2.91* 2.73*  CALCIUM 9.1 8.9   Liver Function Tests: No results for input(s): AST, ALT, ALKPHOS, BILITOT, PROT, ALBUMIN in the last 72 hours. No results for input(s): LIPASE, AMYLASE in the last 72 hours. CBC:  Recent Labs  07/25/14 0500  WBC 11.8*  HGB 9.7*  HCT 29.8*  MCV 89.8  PLT 363   Cardiac Enzymes: No results for input(s): CKTOTAL, CKMB, CKMBINDEX, TROPONINI in the last 72 hours. BNP: Invalid input(s): POCBNP D-Dimer: No results for input(s): DDIMER in the last 72 hours. Hemoglobin A1C: No results for input(s): HGBA1C in the last 72 hours. Fasting Lipid Panel: No results for input(s): CHOL, HDL, LDLCALC, TRIG, CHOLHDL, LDLDIRECT in the last 72 hours. Thyroid Function Tests: No results for input(s): TSH, T4TOTAL, T3FREE, THYROIDAB in the last 72 hours.  Invalid input(s): FREET3 Anemia Panel: No results for input(s): VITAMINB12, FOLATE, FERRITIN, TIBC, IRON, RETICCTPCT in the last 72 hours.  RADIOLOGY: Dg Chest 2 View  07/17/2014   CLINICAL DATA:  Worsening shortness of breath over 2 days  EXAM: CHEST  2 VIEW  COMPARISON:  11/29/2013  FINDINGS: Cardiac shadow is within normal limits. Mild vascular congestion is seen. Small bilateral pleural effusions are noted but improved from the prior exam. Bibasilar scarring is seen. Bilateral nipple shadows are noted.  IMPRESSION: Small bilateral pleural effusions.  Mild vascular congestion.   Electronically Signed   By: [name] M.D.   On: 07/17/2014 13:49   Dg Abd Portable 1v  07/23/2014   CLINICAL DATA:  Abdominal pain since last night.  EXAM: PORTABLE ABDOMEN - 1 VIEW  COMPARISON:  CT of 03/28/2007  FINDINGS: 2 supine views. Dense thoracoabdominal aortic atherosclerosis. No bowel distention. Moderate amount of descending colonic and rectosigmoid stool. Equivocal calcifications projecting over the left side of the abdomen. Favored to be vascular. Left hip arthroplasty and right proximal femoral fixation.  Small  bilateral pleural effusions with adjacent airspace disease.  IMPRESSION: No bowel obstruction or free intraperitoneal air. Possible constipation.  Probable vascular calcifications projecting over the left side of the abdomen. Cannot exclude left sided renal calculi. Of note, no stones are identified on the remote CT.  Small bilateral pleural effusions with adjacent airspace disease. Suboptimally evaluated.   Electronically Signed   By: Abigail Miyamoto M.D.   On: 07/23/2014 09:12    PHYSICAL EXAM General: NAD Neck: JVP 8 cm, no thyromegaly or thyroid nodule.  Lungs: Clear to auscultation bilaterally with normal respiratory effort. CV: Nondisplaced PMI.  Heart regular S1/S2, no S3/S4, 2/6 SEM RUSB.  Trace edema at ankles bilaterally.  Abdomen: Soft, nontender, no hepatosplenomegaly, no distention.  Neurologic: Alert and oriented x 3.  Psych: Normal affect. Extremities: No clubbing or cyanosis.  Skin: Steri strips upper back.   TELEMETRY: Reviewed telemetry pt in NSR  ASSESSMENT AND PLAN: 81 yo with history of CKD, renovascular HTN, PAD including bilateral renal artery stenosis, and chronic diastolic CHF was admitted with dyspnea and AKI.  1. AKI on CKD: Difficult situation. Had been seeing PCP frequently to work on lowering her BP, which had been quite high and uncontrolled. I suspect she has renovascular hypertension with severe right renal artery stenosis and borderline significant left renal artery stenosis. She was admitted with rise in creatinine from 1.7 => 3.Concern that  BP lowering in the face of possible bilateral significant renal artery stenosis worsened renal function acutely. She was also volume overloaded (got IV fluid initially) with creatinine still elevated.  Discussed situation with Drs Marval Regal and Fletcher Anon.  Only reason to intervene on renal artery stenosis would be if we had hemodynamically significant bilateral renal artery stenosis that is causing renal dysfunction.   However, we only know that the artery to one side has definite hemodynamically significant stenosis.  Given high risk of intervention, would hold off.  UOP improving.  2. HTN: Poor control historically with possible renovascular HTN. See discussion above about concern that aggressive BP control will worsen GFR with possible bilateral RAS. Would aim to let SBP run around 150. BP currently stable.  Now off minoxidil.  She is not able to take pills currently due to food sticking in esophagus, will give IV hydralazine to keep BP from going up markedly (has had no meds today).  3. Acute on chronic diastolic CHF: Slow improvement.  Weight down, diuresed reasonably yesterday, JVP lower.  I agree that we can switch her over to po Lasix today.  4. Dysphagia: ?esophageal stricture.  EGD today.   Loralie Champagne 07/25/2014 1:00 PM

## 2014-07-25 NOTE — Consult Note (Signed)
Patient Rachael [name]      DOB: 1923/06/24      OJJ:[id number]     Consult Note from the Palliative Medicine Team at [facility] Requested by: Dr Landis Gandy     PCP: Alesia Richards, MD Reason for Consultation: [place]     Phone Number:[phone]  Assessment/Recommendations: 81 yo female with HTN, CKD, HFpEF, RAS admitted with acute resp failure, AKI, CHF exacerbation.   1.  Code Status: Full Code  2. GOC: Not able to discuss today with Rachael [name].  She is very uncomfortable and anxious due to severe dysphagia and fears of choking.  Was supposed to have EGD today but held due to low K.  Family tells me she has been very anxious secondary to talks of palliative care involvement, complex medical situation and fears around prognosis.  She reportedly kept telling them "dont let me die" before she went down for EGD today.  Renal has told her she is not a dialysis candidate.  Will see how our team can best support her through this.  Will address goals as she is able and allows.    3. Symptom Management:   1. Anxiety/Agitation: Spoke with Dr Landis Gandy who plans on starting Xanax PRN.  I am in total agreement with this. While I normally try to avoid benzo's given her age, I think it is totally appropriate in this case given severity of her anxiety. 2. Constipation: on bowel regimen now. May need to increase based on how she does.  Would consider adding stimulant laxative like senna to docusate.    4. Psychosocial/Spiritual: Widowed.  No children. Main support is her niece who was at bedside today    Brief HPI: Patient is an 81 yo female with PMHx of CKD, HFpEF, HTN, RAS, aortic stenosis who presented on 2/24 with several day history of SOB, DOE, constipation, insomnia.  On admission she was found to have AKI and volume overload c/w CHF exacerbation.  She has been followed by cardiology and nephrology here and diuresis has been difficult secondary to AKI. Creatinine slightly down  past few days and working on oral diuretic regimen.  Renal has deemed her not to be candidate for dialysis should her renal function decline.  GI consulted today 2/2 dysphgia.  Palliative care consult requested at recommendation of nephrology to "discuss goals/limitations" of care.   At time of my visit, Rachael [name] is feeling poorly. States she has not been doing well since she ate 2 pieces of french toast this morning.  Admits to feeling very anxious about having choking sensation again.  She states that she is not really having pain or nausea.  Family at bedside and state that she has been extremely anxious all day. With plan for EGD, she reportedly keeps telling family "dont let me die".    PMH:  Past Medical History  Diagnosis Date  . Vitamin D deficiency   . Diverticulosis   . Coronary artery calcification seen on CAT scan   . TIA (transient ischemic attack)     Carotid dopplers (8/11): 40-50% RICA stenosis. Carotid dopplers (12/13) with 40-59% bilateral ICA stenoses.   . CKD (chronic kidney disease)   . Inguinal hernia   . Broken hip     bilateral  . Hypertension   . Hyperlipidemia   . Hypothyroidism   . Senile osteoporosis   . Osteopenia   . Elevated hemoglobin A1c   . Bradycardia     Holter monitor (8/11) with no significant  bradycardia, frequent PACs, few short runs of slow atrial tachycardia.   . Chronic diastolic heart failure     a. Echo (6/15):  EF 60-65%, Gr 2 DD, mild AS (mean 8 mmHg);  b.  Echo (11/2013):  mild LVH, EF 55-60%, no RWMA, Gr 1 DD, mild AS (peak 19 mmHg), mild MR, trivial eff   . Aortic stenosis     a. mild (echo 6/15 with mean gradient 8 mmHg)  . Carotid stenosis     a. Carotid US (04/2012):  bilateral ICA 40-59%;  b.  Carotid US (8/15): Bilateral ICA 40-59% - follow up 1 year   . Anxiety   . Gastritis 10/2013    EGD showed erosive gastritis and non-obstructing Schatzki's ring.      PSH: Past Surgical History  Procedure Laterality Date  . Inguinal  hernia repair    . Tonsillectomy    . Hip arthroplasty  04/01/2012    Procedure: ARTHROPLASTY BIPOLAR HIP;  Surgeon: Kerin Salen, MD;  Location: [facility];  Service: Orthopedics;  Laterality: Left;  . Compression hip screw  05/06/2012    Procedure: COMPRESSION HIP;  Surgeon: Jolyn Nap, MD;  Location: [facility];  Service: Orthopedics;  Laterality: Right;  . Skin lesion excision      benignt; from shoulder  . Hip surgery      left  . Eye surgery      bilateral  . Eye surgery      tear duct R side  . Blepharoplasty  1983  . Melanoma excision  1991    Right Arm  . Total hip arthroplasty Right 2013  . Total hip arthroplasty Left 2013  . Esophagogastroduodenoscopy N/A 11/12/2013    Procedure: ESOPHAGOGASTRODUODENOSCOPY (EGD);  Surgeon: Jerene Bears, MD;  Location: [facility];  Service: Endoscopy;  Laterality: N/A;   I have reviewed the [place] and [facility] and  If appropriate update it with new information. Allergies  Allergen Reactions  . Aricept [Donepezil Hcl] Nausea And Vomiting    Nausea and vomiting  . Prevacid [Lansoprazole] Nausea And Vomiting    GI upset  . Tylenol [Acetaminophen] Nausea Only    Tylenol #3 - Nausea   Scheduled Meds: . amLODipine  10 mg Oral Daily  . aspirin EC  81 mg Oral Daily  . atorvastatin  20 mg Oral Daily  . bisacodyl  10 mg Rectal Daily  . bisoprolol  10 mg Oral Daily  . cholecalciferol  1,000 Units Oral q morning - 10a  . cloNIDine  0.1 mg Oral TID  . docusate sodium  200 mg Oral BID  . furosemide  80 mg Oral TID  . heparin  5,000 Units Subcutaneous 3 times per day  . hydrALAZINE  100 mg Oral TID  . levothyroxine  175 mcg Oral QAC breakfast  . multivitamin with minerals  1 tablet Oral q morning - 10a  . multivitamin-lutein  1 capsule Oral Daily  . pantoprazole  40 mg Oral BID AC  . polyvinyl alcohol  1 drop Both Eyes TID  . potassium chloride  40 mEq Oral Q3H  . sodium chloride  1 drop Both Eyes QHS  . sodium chloride  3 mL Intravenous Q12H    Continuous Infusions: . lactated ringers 1,000 mL (07/25/14 1357)   PRN Meds:.acetaminophen **OR** acetaminophen, albuterol, hydrALAZINE, morphine injection, ondansetron **OR** ondansetron (ZOFRAN) IV, sodium chloride    BP 176/38 mmHg  Pulse 63  Temp(Src) 98.2 F (36.8 C) (Axillary)  Resp 24  Ht 5' 8.75" (1.746 m)  Wt 51.937 kg (114 lb 8 oz)  BMI 17.04 kg/m2  SpO2 95%   PPS: 30   Intake/Output Summary (Last 24 hours) at 07/25/14 1615 Last data filed at 07/25/14 1538  Gross per 24 hour  Intake   1240 ml  Output   2726 ml  Net  -1486 ml    Physical Exam:  General: Alert, appears very uncomfortable HEENT:  [place], sclera anicteric, mmm Ext: warm  Labs: CBC    Component Value Date/Time   WBC 11.8* 07/25/2014 0500   RBC 3.32* 07/25/2014 0500   HGB 11.6* 07/25/2014 1412   HCT 34.0* 07/25/2014 1412   PLT 363 07/25/2014 0500   MCV 89.8 07/25/2014 0500   MCH 29.2 07/25/2014 0500   MCHC 32.6 07/25/2014 0500   RDW 14.6 07/25/2014 0500   LYMPHSABS 0.5* 07/17/2014 1235   MONOABS 0.9 07/17/2014 1235   EOSABS 0.0 07/17/2014 1235   BASOSABS 0.0 07/17/2014 1235    BMET    Component Value Date/Time   NA 137 07/25/2014 1412   K 2.6* 07/25/2014 1412   CL 96 07/25/2014 0500   CO2 31 07/25/2014 0500   GLUCOSE 141* 07/25/2014 1412   BUN 93* 07/25/2014 0500   CREATININE 2.73* 07/25/2014 0500   CREATININE 2.01* 07/04/2014 1217   CALCIUM 8.9 07/25/2014 0500   GFRNONAA 14* 07/25/2014 0500   GFRNONAA 22* 07/04/2014 1217   GFRAA 17* 07/25/2014 0500   GFRAA 25* 07/04/2014 1217    CMP     Component Value Date/Time   NA 137 07/25/2014 1412   K 2.6* 07/25/2014 1412   CL 96 07/25/2014 0500   CO2 31 07/25/2014 0500   GLUCOSE 141* 07/25/2014 1412   BUN 93* 07/25/2014 0500   CREATININE 2.73* 07/25/2014 0500   CREATININE 2.01* 07/04/2014 1217   CALCIUM 8.9 07/25/2014 0500   PROT 6.5 07/04/2014 1217   ALBUMIN 3.3* 07/21/2014 1158   AST 23 07/04/2014 1217   ALT 17  07/04/2014 1217   ALKPHOS 75 07/04/2014 1217   BILITOT 0.6 07/04/2014 1217   GFRNONAA 14* 07/25/2014 0500   GFRNONAA 22* 07/04/2014 1217   GFRAA 17* 07/25/2014 0500   GFRAA 25* 07/04/2014 1217   2/24 CXR IMPRESSION: Small bilateral pleural effusions.  Mild vascular congestion.    3/1 KUB IMPRESSION: No bowel obstruction or free intraperitoneal air. Possible constipation.  Probable vascular calcifications projecting over the left side of the abdomen. Cannot exclude left sided renal calculi. Of note, no stones are identified on the remote CT.  Small bilateral pleural effusions with adjacent airspace disease. Suboptimally evaluated.   Total Time: 50 minutes Greater than 50%  of this time was spent counseling and coordinating care related to the above assessment and plan.  Doran Clay D.O. Palliative Medicine Team at [facility]  Pager: [phone] Team Phone: [phone]

## 2014-07-25 NOTE — Progress Notes (Signed)
PT Cancellation Note  Patient Details Name: Rachael [name] MRN: [id number] DOB: 09-21-1923   Cancelled Treatment:    Reason Eval/Treat Not Completed: Patient at procedure or test/unavailable   Jillaine Waren 07/25/2014, 2:22 PM

## 2014-07-25 NOTE — Progress Notes (Signed)
Pt's potassium was rechecked at 1400 and is 2.6, was 2.8 on serum level at 0500 and she had received 40 meq of oral potassium this AM after that.  Also go oral potassium 80 meq yesterday. Potassium level too low for anesthesia to proceed with EGD, so it is to be postponed until tomorrow. Spoke with Dr Roseanne Kaufman of change in plans.  Will defer mgt of hypokalemia and fluids to him.  I ordered BMET for the AM.  Would limit oral feeding to liquids only, also d/w attending.    Azucena Freed PA-C [phone].   GI Attending Note   Chart was reviewed and patient was examined. X-rays and lab were reviewed.    It appears that she may have a food impaction but it would be a bit unusual to be obstructed from an early Schatzki's ring that was seen on prior EGD.  EGD was cancelled because of hypokalemia.  Will order a barium swallow.  If there is an impaction will proceed with EGD tomorrow.  [name]. Deatra Ina, M.D., [facility] Cell [phone]

## 2014-07-25 NOTE — Progress Notes (Signed)
Patient ID: Rachael [name], female   DOB: 11-23-1923, 81 y.o.   MRN: [id number] S:Feels terrible, can't swallow anything, "gets stuck in my throat" O:BP 156/38 mmHg  Pulse 70  Temp(Src) 97.7 F (36.5 C) (Oral)  Resp 16  Ht 5' 8.75" (1.746 m)  Wt 51.937 kg (114 lb 8 oz)  BMI 17.04 kg/m2  SpO2 99%  Intake/Output Summary (Last 24 hours) at 07/25/14 1017 Last data filed at 07/25/14 0946  Gross per 24 hour  Intake   1600 ml  Output   2527 ml  Net   -927 ml   Intake/Output: I/O last 3 completed shifts: In: 23 [P.O.:1660] Out: 3601 [Urine:3600; Stool:1]  Intake/Output this shift:  Total I/O In: 540 [P.O.:540] Out: 501 [Urine:500; Emesis/NG output:1] Weight change: -1.905 kg (-4 lb 3.2 oz) AYT:KZSWF elderly WF in mild distress CVS:no rub Resp:cta Abd:+BS, soft, mildly tender Ext:tr edema   Recent Labs Lab 07/19/14 0455 07/20/14 0320 07/21/14 1158 07/22/14 1040 07/23/14 0425 07/24/14 0520 07/25/14 0500  NA 132* 133* 136 134* 136 139 138  K 4.1 3.7 3.7 3.6 3.2* 4.1 2.8*  CL 99 101 100 99 96 101 96  CO2 22 22 25 23 25 24 31   GLUCOSE 120* 109* 120* 177* 187* 105* 117*  BUN 83* 83* 83* 88* 88* 90* 93*  CREATININE 3.18* 3.15* 2.91* 3.16* 2.99* 2.91* 2.73*  ALBUMIN  --   --  3.3*  --   --   --   --   CALCIUM 8.1* 8.2* 8.7 8.9 9.3 9.1 8.9  PHOS  --   --  5.6*  --   --   --   --    Liver Function Tests:  Recent Labs Lab 07/21/14 1158  ALBUMIN 3.3*   No results for input(s): LIPASE, AMYLASE in the last 168 hours. No results for input(s): AMMONIA in the last 168 hours. CBC:  Recent Labs Lab 07/25/14 0500  WBC 11.8*  HGB 9.7*  HCT 29.8*  MCV 89.8  PLT 363   Cardiac Enzymes: No results for input(s): CKTOTAL, CKMB, CKMBINDEX, TROPONINI in the last 168 hours. CBG: No results for input(s): GLUCAP in the last 168 hours.  Iron Studies: No results for input(s): IRON, TIBC, TRANSFERRIN, FERRITIN in the last 72 hours. Studies/Results: No results found. Marland Kitchen  amLODipine  10 mg Oral Daily  . aspirin EC  81 mg Oral Daily  . atorvastatin  20 mg Oral Daily  . bisacodyl  10 mg Rectal Daily  . bisoprolol  10 mg Oral Daily  . cholecalciferol  1,000 Units Oral q morning - 10a  . cloNIDine  0.1 mg Oral TID  . docusate sodium  200 mg Oral BID  . furosemide  80 mg Intravenous TID  . heparin  5,000 Units Subcutaneous 3 times per day  . hydrALAZINE  100 mg Oral TID  . levothyroxine  175 mcg Oral QAC breakfast  . multivitamin with minerals  1 tablet Oral q morning - 10a  . multivitamin-lutein  1 capsule Oral Daily  . pantoprazole  40 mg Oral BID AC  . polyvinyl alcohol  1 drop Both Eyes TID  . potassium chloride  40 mEq Oral Q3H  . sodium chloride  1 drop Both Eyes QHS  . sodium chloride  3 mL Intravenous Q12H    BMET    Component Value Date/Time   NA 138 07/25/2014 0500   K 2.8* 07/25/2014 0500   CL 96 07/25/2014 0500   CO2 31 07/25/2014  0500   GLUCOSE 117* 07/25/2014 0500   BUN 93* 07/25/2014 0500   CREATININE 2.73* 07/25/2014 0500   CREATININE 2.01* 07/04/2014 1217   CALCIUM 8.9 07/25/2014 0500   GFRNONAA 14* 07/25/2014 0500   GFRNONAA 22* 07/04/2014 1217   GFRAA 17* 07/25/2014 0500   GFRAA 25* 07/04/2014 1217   CBC    Component Value Date/Time   WBC 11.8* 07/25/2014 0500   RBC 3.32* 07/25/2014 0500   HGB 9.7* 07/25/2014 0500   HCT 29.8* 07/25/2014 0500   PLT 363 07/25/2014 0500   MCV 89.8 07/25/2014 0500   MCH 29.2 07/25/2014 0500   MCHC 32.6 07/25/2014 0500   RDW 14.6 07/25/2014 0500   LYMPHSABS 0.5* 07/17/2014 1235   MONOABS 0.9 07/17/2014 1235   EOSABS 0.0 07/17/2014 1235   BASOSABS 0.0 07/17/2014 1235     81 yo with history of CKD, HTN, PAD including dopplers recently suggestive of "> 60 % right renal artery stenosis", < 60% left renal artery stenosis, significant atherosclerosis of the distal aorta, chronic diastolic CHF, and CKD stage 4 at baseline.  Assessment/Plan:  1. AKI/CKD- in setting of decompensated CHF  +/- R RAS. Pt is a poor dialysis candidate given her advanced age and protein malnutrition as well as her debilitated state. 1. Recommend palliative care consult to discuss goals/limits of care. 2. Scr slowly improving 2. Acute on chronic diastolic CHF- diuresing slowly. 1. Continue with zaroxolyn  2. Change to po lasix today and follow 3. Possible right renal artery stenosis- given severe atherosclerosis of distal aorta, high risk for intervention and would treat medically 1. Would only consider intervention if she had critical bilateral RAS (but that is doubtful in light of improving UOP, Scr, and BP). 4. Dysphagia with N/V- has h/o esophageal ring, consult GI as she is unable to swallow or keep anything down. 5. Stool impaction- improved post manual disimpaction and laxatives 6. Anemia of chronic disease- will check iron stores and ESA (pending palliative care eval) 7. HTN- stable 8. Anorexia/FTT- cont with protein supplements, consider megace 9. Dispo- agree with palliative care consult.  [place] A

## 2014-07-25 NOTE — Anesthesia Preprocedure Evaluation (Deleted)
Anesthesia Evaluation  Patient identified by MRN, date of birth, ID band Patient awake    Airway        Dental   Pulmonary former smoker,          Cardiovascular hypertension, Pt. on medications + CAD and + Peripheral Vascular Disease  ECHO 2015 EF 55-60%   Neuro/Psych Anxiety Carotids 59% bilat stenosis 2015 TIA   GI/Hepatic PUD,   Endo/Other    Renal/GU Renal InsufficiencyRenal diseaseK 2.8 will repeat, creat 2.9, GFR 16     Musculoskeletal   Abdominal   Peds  Hematology  (+) anemia , 10/30   Anesthesia Other Findings   Reproductive/Obstetrics                          Anesthesia Physical Anesthesia Plan  ASA: III  Anesthesia Plan: General   Post-op Pain Management:    Induction: Intravenous  Airway Management Planned: Oral ETT  Additional Equipment:   Intra-op Plan:   Post-operative Plan: Extubation in OR  Informed Consent: I have reviewed the patients History and Physical, chart, labs and discussed the procedure including the risks, benefits and alternatives for the proposed anesthesia with the patient or authorized representative who has indicated his/her understanding and acceptance.     Plan Discussed with:   Anesthesia Plan Comments: (Need to re check K+,( Repeat K+ 2.6, need to replace K+, then proceed with anesthesia unless emergency and inability to wait is documented in medical record))       Anesthesia Quick Evaluation

## 2014-07-25 NOTE — Consult Note (Signed)
[facility] Consult: 10:56 AM 07/25/2014  LOS: 8 days    Referring Provider: Dr Roseanne Kaufman  Primary Care Physician:  Alesia Richards, MD Primary Gastroenterologist:  Dr. Henrene Pastor.     Reason for Consultation:  Dysphagia.    HPI: Rachael [name] is a 81 y.o. female.  Hx diastolic heart failure, stage 3 CKD, renal artery stenosis, aortic stenosis, anemia (chronic dz), protein malnutrition.   EGD of 10/2013: for melena.  Showed non-obstructing Schatzki's ring, erosive gastritis, benign gastric polyps. Takes daily Protonix. PMD had recently adjusted BP meds in attempt to control OOC hypertension.  Admtted over one week ago with dyspnea, polydipsia/thirst, oliguria, reduced exercise tolerance.  Labs showed acute on chronic CKD, BNP 1100.  Difficult situation as having both AKI and volume overload. She is not a dialysis candidate.  Dr Arty Baumgartner has suggested pallitive care goals of care consult, not yet completed.   GI wise she has constipation, this improved after dulcolax suppository and manual disimpaction.  Pt had BM yesterday.   Abdominal film 3/1 was non-obstructed but showed moderate amount of stool in descending and rectosigmoid.  Also with 3 weeks solid dysphagia, able to take liquids but stopped taking significant po of any sort due to dysphagia.  This AM, after "2 bites" of french toast, she felt like it got stuck in lower esophagus and is having some nausea and discomfort in the lower esophagus.  However later in AM she was able to swallow potassium tablet that was broken in half.  RNs who have been dealing with her all week confirm high level of anxiety.   Past Medical History  Diagnosis Date  . Vitamin D deficiency   . Diverticulosis   . Coronary artery calcification seen on CAT scan   . TIA  (transient ischemic attack)     Carotid dopplers (8/11): 40-50% RICA stenosis. Carotid dopplers (12/13) with 40-59% bilateral ICA stenoses.   . CKD (chronic kidney disease)   . Inguinal hernia   . Broken hip     bilateral  . Hypertension   . Hyperlipidemia   . Hypothyroidism   . Senile osteoporosis   . Osteopenia   . Elevated hemoglobin A1c   . Bradycardia     Holter monitor (8/11) with no significant bradycardia, frequent PACs, few short runs of slow atrial tachycardia.   . Chronic diastolic heart failure     a. Echo (6/15):  EF 60-65%, Gr 2 DD, mild AS (mean 8 mmHg);  b.  Echo (11/2013):  mild LVH, EF 55-60%, no RWMA, Gr 1 DD, mild AS (peak 19 mmHg), mild MR, trivial eff   . Aortic stenosis     a. mild (echo 6/15 with mean gradient 8 mmHg)  . Carotid stenosis     a. Carotid US (04/2012):  bilateral ICA 40-59%;  b.  Carotid US (8/15): Bilateral ICA 40-59% - follow up 1 year     Past Surgical History  Procedure Laterality Date  . Inguinal hernia repair    . Tonsillectomy    . Hip arthroplasty  04/01/2012  Procedure: ARTHROPLASTY BIPOLAR HIP;  Surgeon: Kerin Salen, MD;  Location: [facility];  Service: Orthopedics;  Laterality: Left;  . Compression hip screw  05/06/2012    Procedure: COMPRESSION HIP;  Surgeon: Jolyn Nap, MD;  Location: [facility];  Service: Orthopedics;  Laterality: Right;  . Skin lesion excision      benignt; from shoulder  . Hip surgery      left  . Eye surgery      bilateral  . Eye surgery      tear duct R side  . Blepharoplasty  1983  . Melanoma excision  1991    Right Arm  . Total hip arthroplasty Right 2013  . Total hip arthroplasty Left 2013  . Esophagogastroduodenoscopy N/A 11/12/2013    Procedure: ESOPHAGOGASTRODUODENOSCOPY (EGD);  Surgeon: Jerene Bears, MD;  Location: [facility];  Service: Endoscopy;  Laterality: N/A;    Prior to Admission medications   Medication Sig Start Date End Date Taking? Authorizing Provider  allopurinol (ZYLOPRIM)  100 MG tablet Take 1 tablet (100 mg total) by mouth every morning. 04/22/14  Yes Unk Pinto, MD  amLODipine (NORVASC) 5 MG tablet Take 1 tablet (5 mg total) by mouth daily. 07/02/14  Yes Wellington Hampshire, MD  aspirin EC 81 MG tablet Take 1 tablet (81 mg total) by mouth daily. 01/21/14  Yes Larey Dresser, MD  atorvastatin (LIPITOR) 20 MG tablet Take 1 tablet (20 mg total) by mouth daily. 07/10/14  Yes Unk Pinto, MD  Biotin 1000 MCG tablet Take 1,000 mcg by mouth every morning.    Yes Historical Provider, MD  bisoprolol (ZEBETA) 10 MG tablet Take 1 tablet (10 mg total) by mouth daily. 12/02/13  Yes Kinnie Feil, MD  cholecalciferol (VITAMIN D) 1000 UNITS tablet Take 1,000 Units by mouth every morning.    Yes Historical Provider, MD  cloNIDine (CATAPRES) 0.2 MG tablet Take 1 tablet (0.2 mg total) by mouth 3 (three) times daily. 12/06/13 12/07/14 Yes Unk Pinto, MD  furosemide (LASIX) 40 MG tablet Take 2 tablets (80 mg total) by mouth daily. 06/28/14  Yes Larey Dresser, MD  hydrALAZINE (APRESOLINE) 100 MG tablet Take 1 tablet (100 mg total) by mouth 3 (three) times daily. 06/05/14 06/05/15 Yes Amy D Clegg, NP  levothyroxine (SYNTHROID, LEVOTHROID) 175 MCG tablet Take 175 mcg by mouth daily before breakfast. Pt taking one half tablet on Sun, Tue. Thur.,and Sat.   Yes Historical Provider, MD  minoxidil (LONITEN) 2.5 MG tablet Take 1 to 2 tablets daily for BP as directed Patient taking differently: Take 7.5 mg by mouth daily.  07/04/14 09/02/14 Yes Unk Pinto, MD  Multiple Vitamin (MULTIVITAMIN WITH MINERALS) TABS Take 1 tablet by mouth every morning.    Yes Historical Provider, MD  pantoprazole (PROTONIX) 40 MG tablet Take 1 tablet (40 mg total) by mouth 2 (two) times daily before a meal. 04/22/14  Yes Unk Pinto, MD  potassium chloride SA (K-DUR,KLOR-CON) 20 MEQ tablet Take 1 tablet (20 mEq total) by mouth daily. Patient taking differently: Take 20-40 mEq by mouth. Taking 20 meq  alternating with 40 meq every other day. 07/02/14  Yes Larey Dresser, MD  Propylene Glycol (SYSTANE BALANCE OP) Apply 1-2 drops to eye 3 (three) times daily.   Yes Historical Provider, MD  sodium chloride (MURO 128) 5 % ophthalmic solution Place 1 drop into both eyes at bedtime.   Yes Historical Provider, MD  spironolactone (ALDACTONE) 25 MG tablet Take 1 tablet (25 mg  total) by mouth daily. 06/17/14  Yes Larey Dresser, MD    Scheduled Meds: . amLODipine  10 mg Oral Daily  . aspirin EC  81 mg Oral Daily  . atorvastatin  20 mg Oral Daily  . bisacodyl  10 mg Rectal Daily  . bisoprolol  10 mg Oral Daily  . cholecalciferol  1,000 Units Oral q morning - 10a  . cloNIDine  0.1 mg Oral TID  . docusate sodium  200 mg Oral BID  . furosemide  80 mg Intravenous TID  . furosemide  80 mg Oral TID  . heparin  5,000 Units Subcutaneous 3 times per day  . hydrALAZINE  100 mg Oral TID  . levothyroxine  175 mcg Oral QAC breakfast  . multivitamin with minerals  1 tablet Oral q morning - 10a  . multivitamin-lutein  1 capsule Oral Daily  . pantoprazole  40 mg Oral BID AC  . polyvinyl alcohol  1 drop Both Eyes TID  . potassium chloride  40 mEq Oral Q3H  . sodium chloride  1 drop Both Eyes QHS  . sodium chloride  3 mL Intravenous Q12H   Infusions:   PRN Meds: acetaminophen **OR** acetaminophen, albuterol, hydrALAZINE, morphine injection, ondansetron **OR** ondansetron (ZOFRAN) IV, sodium chloride   Allergies as of 07/17/2014 - Review Complete 07/17/2014  Allergen Reaction Noted  . Aricept [donepezil hcl] Nausea And Vomiting 04/12/2013  . Prevacid [lansoprazole] Nausea And Vomiting 04/11/2013  . Tylenol [acetaminophen] Nausea Only 04/11/2013    Family History  Problem Relation Age of Onset  . CVA Mother     died age 32  . Emphysema Sister   . Colon cancer Neg Hx   . Other      patient does not know    History   Social History  . Marital Status: Widowed    Spouse Name: N/A  . Number of  Children: 0  . Years of Education: N/A   Occupational History  . Not on file.   Social History Main Topics  . Smoking status: Former Smoker    Types: Cigarettes    Quit date: 05/06/1971  . Smokeless tobacco: Never Used  . Alcohol Use: No     Comment: none since Nov 2013-was on 2 beers/night  . Drug Use: No  . Sexual Activity: Not Currently   Other Topics Concern  . Not on file   Social History Narrative    REVIEW OF SYSTEMS: Constitutional:  Feels weak. Says that at home she was moving around without difficulty.  Measurements of weight reflect a 9 pound drop in the last 2 weeks. ENT:  No nose bleeds.  Has full upper dentures and partial lower dentures. She is stopped wearing the lower dentures because of weight loss and the dentures aren't fitting well anymore. Pulm:  Dyspnea is stable. Not coughing. CV:  No palpitations, no LE edema.  GU:  No hematuria, no frequency GI:  Per HPI Heme:  Per HPI   Transfusions:  None per her recall.  Reviewed all Epic media and trees and there is nothing corresponding with transfusions in those records. Neuro:  No headaches, no peripheral tingling or numbness Derm:  No itching, no rash or sores.  Endocrine:  No sweats or chills.  No polyuria or dysuria Immunization:  Influenza vaccination up-to-date. Travel:  None beyond local counties in last few months.    PHYSICAL EXAM: Vital signs in last 24 hours: Filed Vitals:   07/25/14 0900  BP: 156/38  Pulse:  70  Temp: 97.7 F (36.5 C)  Resp: 16   Wt Readings from Last 3 Encounters:  07/25/14 114 lb 8 oz (51.937 kg)  07/15/14 122 lb 9.6 oz (55.611 kg)  07/11/14 123 lb 3.2 oz (55.883 kg)    General: Cachectic, aged, unwell and uncomfortable appearing WF Head:  No facial swelling or asymmetry.  Eyes:  No scleral icterus, no conjunctival pallor Ears:  Slightly HOH  Nose:  No congestion or discharge Mouth:  Mucous membranes are somewhat dry. No lesions or exudates. Full upper denture in  place and not removed for exam. All of her lower molars are missing Neck:  No mass, no JVD. Lungs:  Diminished breath sounds in the bases. No adventitious sounds. Some dyspnea with speech. No cough. Heart: RRR. No MRG. S1/S2 audible Abdomen:  Soft, bowel sounds present. Soft fullness in the right abdomen but no masses. No hernias, no bruits..   Rectal: Deferred   Musc/Skeltl: Some arthritic changes in the knees and hands. Extremities:  No lower extremity or upper extremity edema  Neurologic:  Patient oriented to place and self. No acute confusion. No tremor, no gross motor deficits. Skin:  No telangiectasia, no sores or rashes. Tattoos:  None Nodes:  No inguinal or cervical adenopathy.   Psych:  Anxious and a bit agitated, cooperative.  Intake/Output from previous day: 03/02 0701 - 03/03 0700 In: 1180 [P.O.:1180] Out: 2526 [Urine:2525; Stool:1] Intake/Output this shift: Total I/O In: 540 [P.O.:540] Out: 501 [Urine:500; Emesis/NG output:1]  LAB RESULTS:  Recent Labs  07/25/14 0500  WBC 11.8*  HGB 9.7*  HCT 29.8*  PLT 363   BMET Lab Results  Component Value Date   NA 138 07/25/2014   NA 139 07/24/2014   NA 136 07/23/2014   K 2.8* 07/25/2014   K 4.1 07/24/2014   K 3.2* 07/23/2014   CL 96 07/25/2014   CL 101 07/24/2014   CL 96 07/23/2014   CO2 31 07/25/2014   CO2 24 07/24/2014   CO2 25 07/23/2014   GLUCOSE 117* 07/25/2014   GLUCOSE 105* 07/24/2014   GLUCOSE 187* 07/23/2014   BUN 93* 07/25/2014   BUN 90* 07/24/2014   BUN 88* 07/23/2014   CREATININE 2.73* 07/25/2014   CREATININE 2.91* 07/24/2014   CREATININE 2.99* 07/23/2014   CALCIUM 8.9 07/25/2014   CALCIUM 9.1 07/24/2014   CALCIUM 9.3 07/23/2014   LFT No results for input(s): PROT, ALBUMIN, AST, ALT, ALKPHOS, BILITOT, BILIDIR, IBILI in the last 72 hours. PT/INR Lab Results  Component Value Date   INR 1.06 11/11/2013   INR 1.06 02/08/2013   INR 1.15 05/05/2012   Hepatitis Panel No results for  input(s): HEPBSAG, HCVAB, HEPAIGM, HEPBIGM in the last 72 hours. C-Diff No components found for: CDIFF Lipase     Component Value Date/Time   LIPASE 47 09/28/2012 1648    Drugs of Abuse  No results found for: LABOPIA, COCAINSCRNUR, LABBENZ, AMPHETMU, THCU, LABBARB   RADIOLOGY STUDIES: No results found.  ENDOSCOPIC STUDIES: 10/2013  EGD for Melena, Dr Hilarie Fredrickson ENDOSCOPIC IMPRESSION: 1. Nonobstructing Schatzki ring was found 40 cm from the incisors; otherwise normal esophagus 2. Erosive gastritis (inflammation) was found in the gastric antrum; biopsies were taken in the antrum and angularis 3. Benign proximal gastric polyps, the stomach otherwise appearednormal 4. The duodenal mucosa showed no abnormalities in the bulb and second portion of the duodenum RECOMMENDATIONS: 1. Daily PPI, avoid NSAIDs other than aspirin 2. Monitor Hgb, no plans for colonoscopy at this time Pathology  Stomach, biopsy - BENIGN GASTRIC BODY-TYPE MUCOSA WITH SURFACE EROSION. - NO EVIDENCE OF HELICOBACTER PYLORI, INTESTINAL METAPLASIA, DYSPLASIA OR MALIGNANCY  05/2007  Colonoscopy  For weight loss.  Dr Henrene Pastor No polyps.  Severe diverticulosis of ascending to sigmoid.    IMPRESSION:   *  Acute on chronic dysphagia. Symptoms began about 3 weeks ago, acutely worse this AM.. EGD 10/2013 showed an incidental nonobstructing Schatzki's ring which may be the culprit. However given her age esophageal spasm/dysmotility and presbyesophagus are also very likely. However we have no previous swallowing evaluations or esophagrams demonstrating esophageal dysmotility.  *  Anxiety.  This is certainly contributing to her experience of dysphagia  *  AKI, CKD.  Not a dialysis candidate.   *  diastolic CHF  *  Normocytic anemia. Chronic.  Current Hgb of 9.7 is similar to levels in 10/2013 but lower than the 10.5 to 11.5 of October- December 2015.    *  Hard to control htn, wonder if anxiety is playing a role,  though pt has RAS.  Unable to address RAS due to renal disease.      PLAN:     *  Patient will undergo upper endoscopy with anesthesia assisted sedation this afternoon. Need to keep her nothing by mouth for the time being.     Azucena Freed  07/25/2014, 10:56 AM Pager: [phone]  Plan barium swallow as first test, EGD if impacted.  [name]. Deatra Ina, M.D., [facility].[email or web address]

## 2014-07-25 NOTE — Progress Notes (Signed)
Patient Demographics  Rachael [name], is a 82 y.o. female, DOB - 1923-12-29, BWL:[id number]  Admit date - 07/17/2014   Admitting Physician Inda Castle, MD  Outpatient Primary MD for the patient is Alesia Richards, MD  LOS - 8   Chief Complaint  Patient presents with  . Shortness of Breath      Brief summary  81 year old Caucasian female with history of diastolic diastolic dysfunction, chronic kidney disease stage V, early controlled hypertension, who was admitted to the hospital for CHF, diuresis has been in challenge due to poor renal function, cardiology renal both following. Her current issue is severe constipation. Bowel regimen has been initiated. She is still 2-3 days away from discharge as her diuresis has not been adequate so far. She is still short of breath has edema and requiring oxygen.    Subjective:   Rachael [name] today has, No headache, No chest pain, today has constipation and generalized abdominal pain - No Nausea, No new weakness tingling or numbness, No Cough - improvement of shortness of breath, but still there, had an episode of dysphagia after breakfast.  Assessment & Plan     Acute respiratory failure, fluid overload due to acute on chronic diastolic CHF EF 68% on recent echo. Does have evidence of fluid overload, stopped IV fluids, on IV Lasix and Zaroxolyn adjusted by nephrology, be transitioned to oral Lasix and it is suspicion of discharge, continue beta blocker.  Continue supportive care and monitor. Try to titrate off O2. Diuresis has been a challenge due to poor baseline renal function. Cardiology and renal both following.  Filed Weights   07/23/14 0442 07/24/14 0449 07/25/14 0450  Weight: 55.248 kg (121 lb 12.8 oz) 53.842 kg (118 lb 11.2 oz) 51.937 kg  (114 lb 8 oz)     acute on chronic diastolic CHF EF 11% on recent echo - Improving with diuresis, diuresis is limited due to renal failure   ARF and the kidney disease stage IV. Baseline creatinine around 2, diurese and monitor. Likely due to CHF decompensation. Avoid nephrotoxins. Renal following. - Possible renal artery stenosis, high risk for intervention . - Not a candidate for hemodialysis, requested palliative care consult.  Dysphagia - Gastroenterology input appreciated, EGD postponed today secondary to hypokalemia, continue with clear liquid diet, for EGD in a.m. - on  clear liquid diet,  - known Nonobstructing Schatzki ring was found 40 cm from the incisors on endoscopy last year.  Essential hypertension. Continue home medications unchanged at this point.   Hyperglycemia in the ER. Likely due to stress. Recent A1c was 5.6.    Constipation with abdominal pain. Abdominal x-ray shows no obstruction, resolved   Hypokalemia. Replaced, will recheck in a.m.( will give liquid form due to dysphagia)    Hypothyroidism. Continue home dose Synthroid    Dyslipidemia. On statin      Code Status: Full  Family Communication: none at bedside. Disposition Plan: To be decided   Procedures    Consults   Cardiology. nephrology   Medications  Scheduled Meds: . amLODipine  10 mg Oral Daily  . aspirin EC  81 mg Oral Daily  . atorvastatin  20 mg Oral Daily  . bisacodyl  10 mg Rectal Daily  . bisoprolol  10 mg Oral Daily  . cholecalciferol  1,000 Units Oral q morning - 10a  . cloNIDine  0.1 mg Oral TID  . docusate sodium  200 mg Oral BID  . [MAR Hold] furosemide  80 mg Intravenous TID  . furosemide  80 mg Oral TID  . heparin  5,000 Units Subcutaneous 3 times per day  . hydrALAZINE  100 mg Oral TID  . levothyroxine  175 mcg Oral QAC breakfast  . multivitamin with minerals  1 tablet Oral q morning - 10a  . multivitamin-lutein  1 capsule Oral Daily  . pantoprazole  40  mg Oral BID AC  . polyvinyl alcohol  1 drop Both Eyes TID  . potassium chloride  40 mEq Oral Q3H  . sodium chloride  1 drop Both Eyes QHS  . sodium chloride  3 mL Intravenous Q12H   Continuous Infusions: . sodium chloride    . lactated ringers 1,000 mL (07/25/14 1357)   PRN Meds:.acetaminophen **OR** acetaminophen, albuterol, hydrALAZINE, morphine injection, ondansetron **OR** ondansetron (ZOFRAN) IV, sodium chloride  DVT Prophylaxis   Heparin  Lab Results  Component Value Date   PLT 363 07/25/2014    Antibiotics    Anti-infectives    None          Objective:   Filed Vitals:   07/25/14 0900 07/25/14 1301 07/25/14 1352 07/25/14 1453  BP: 156/38 173/42 194/36 176/38  Pulse: 70 58  63  Temp: 97.7 F (36.5 C)  97.5 F (36.4 C) 98.2 F (36.8 C)  TempSrc: Oral  Oral Axillary  Resp: 16 24 22 24   Height:      Weight:      SpO2: 99% 94% 95% 95%    Wt Readings from Last 3 Encounters:  07/25/14 51.937 kg (114 lb 8 oz)  07/15/14 55.611 kg (122 lb 9.6 oz)  07/11/14 55.883 kg (123 lb 3.2 oz)     Intake/Output Summary (Last 24 hours) at 07/25/14 1544 Last data filed at 07/25/14 1538  Gross per 24 hour  Intake   1240 ml  Output   2726 ml  Net  -1486 ml     Physical Exam  Awake Alert, Oriented X 3, No new F.N deficits, Normal affect [place].AT,PERRAL Supple Neck,No JVD, No cervical lymphadenopathy appriciated.  Symmetrical Chest wall movement, Good air movement bilaterally, rales RRR,No Gallops,Rubs or new Murmurs, No Parasternal Heave +ve B.Sounds, Abd Soft, No tenderness, No organomegaly appriciated, No rebound - guarding or rigidity. No Cyanosis, Clubbing , trace edema, No new Rash or bruise     Data Review   Micro Results Recent Results (from the past 240 hour(s))  Urine culture     Status: None   Collection Time: 07/17/14  6:49 PM  Result Value Ref Range Status   Specimen Description URINE, CLEAN CATCH  Final   Special Requests NONE  Final   Colony  Count NO GROWTH Performed at [facility]   Final   Culture NO GROWTH Performed at [facility]   Final   Report Status 07/18/2014 FINAL  Final    Radiology Reports Dg Chest 2 View  07/17/2014   CLINICAL DATA:  Worsening shortness of breath over 2 days  EXAM: CHEST  2 VIEW  COMPARISON:  11/29/2013  FINDINGS: Cardiac shadow is within normal limits. Mild vascular congestion is seen. Small bilateral pleural effusions are noted but improved from the prior exam. Bibasilar scarring is seen. Bilateral nipple shadows are noted.  IMPRESSION: Small bilateral pleural effusions.  Mild vascular congestion.   Electronically Signed   By: [name] M.D.   On: 07/17/2014 13:49   Dg Abd Portable 1v  07/23/2014   CLINICAL DATA:  Abdominal pain since last night.  EXAM: PORTABLE ABDOMEN - 1 VIEW  COMPARISON:  CT of 03/28/2007  FINDINGS: 2 supine views. Dense thoracoabdominal aortic atherosclerosis. No bowel distention. Moderate amount of descending colonic and rectosigmoid stool. Equivocal calcifications projecting over the left side of the abdomen. Favored to be vascular. Left hip arthroplasty and right proximal femoral fixation.  Small bilateral pleural effusions with adjacent airspace disease.  IMPRESSION: No bowel obstruction or free intraperitoneal air. Possible constipation.  Probable vascular calcifications projecting over the left side of the abdomen. Cannot exclude left sided renal calculi. Of note, no stones are identified on the remote CT.  Small bilateral pleural effusions with adjacent airspace disease. Suboptimally evaluated.   Electronically Signed   By: Abigail Miyamoto M.D.   On: 07/23/2014 09:12     CBC  Recent Labs Lab 07/25/14 0500 07/25/14 1412  WBC 11.8*  --   HGB 9.7* 11.6*  HCT 29.8* 34.0*  PLT 363  --   MCV 89.8  --   MCH 29.2  --   MCHC 32.6  --   RDW 14.6  --     Chemistries   Recent Labs Lab 07/21/14 1158 07/22/14 1040 07/23/14 0425 07/24/14 0520  07/25/14 0500 07/25/14 1412  NA 136 134* 136 139 138 137  K 3.7 3.6 3.2* 4.1 2.8* 2.6*  CL 100 99 96 101 96  --   CO2 25 23 25 24 31   --   GLUCOSE 120* 177* 187* 105* 117* 141*  BUN 83* 88* 88* 90* 93*  --   CREATININE 2.91* 3.16* 2.99* 2.91* 2.73*  --   CALCIUM 8.7 8.9 9.3 9.1 8.9  --    ------------------------------------------------------------------------------------------------------------------ estimated creatinine clearance is 11.7 mL/min (by C-G formula based on Cr of 2.73). ------------------------------------------------------------------------------------------------------------------ No results for input(s): HGBA1C in the last 72 hours. ------------------------------------------------------------------------------------------------------------------ No results for input(s): CHOL, HDL, LDLCALC, TRIG, CHOLHDL, LDLDIRECT in the last 72 hours. ------------------------------------------------------------------------------------------------------------------ No results for input(s): TSH, T4TOTAL, T3FREE, THYROIDAB in the last 72 hours.  Invalid input(s): FREET3 ------------------------------------------------------------------------------------------------------------------ No results for input(s): VITAMINB12, FOLATE, FERRITIN, TIBC, IRON, RETICCTPCT in the last 72 hours.  Coagulation profile No results for input(s): INR, PROTIME in the last 168 hours.  No results for input(s): DDIMER in the last 72 hours.  Cardiac Enzymes No results for input(s): CKMB, TROPONINI, MYOGLOBIN in the last 168 hours.  Invalid input(s): CK ------------------------------------------------------------------------------------------------------------------ Invalid input(s): POCBNP     Time Spent in minutes   30 minutes.   [facility], Nickia Boesen M.D on 07/25/2014 at 3:44 PM  Between 7am to 7pm - Pager - [phone]  After 7pm go to [email or web address] - password [facility]  [facility]   Office   [phone]

## 2014-07-26 ENCOUNTER — Encounter (HOSPITAL_COMMUNITY)
Admission: EM | Disposition: A | Discharge status: Hospice | Payer: Self-pay | Source: Home / Self Care | Attending: Internal Medicine

## 2014-07-26 ENCOUNTER — Inpatient Hospital Stay (HOSPITAL_COMMUNITY): Payer: Medicare Other

## 2014-07-26 ENCOUNTER — Encounter (HOSPITAL_COMMUNITY): Payer: Self-pay

## 2014-07-26 DIAGNOSIS — R110 Nausea: Secondary | ICD-10-CM

## 2014-07-26 DIAGNOSIS — K222 Esophageal obstruction: Secondary | ICD-10-CM

## 2014-07-26 DIAGNOSIS — R1314 Dysphagia, pharyngoesophageal phase: Secondary | ICD-10-CM

## 2014-07-26 HISTORY — PX: ESOPHAGOGASTRODUODENOSCOPY: SHX5428

## 2014-07-26 HISTORY — PX: SAVORY DILATION: SHX5439

## 2014-07-26 LAB — BASIC METABOLIC PANEL
Anion gap: 12 (ref 5–15)
BUN: 83 mg/dL — AB (ref 6–23)
CALCIUM: 9 mg/dL (ref 8.4–10.5)
CHLORIDE: 97 mmol/L (ref 96–112)
CO2: 32 mmol/L (ref 19–32)
Creatinine, Ser: 2.51 mg/dL — ABNORMAL HIGH (ref 0.50–1.10)
GFR calc Af Amer: 19 mL/min — ABNORMAL LOW (ref >90)
GFR calc non Af Amer: 16 mL/min — ABNORMAL LOW (ref >90)
Glucose, Bld: 113 mg/dL — ABNORMAL HIGH (ref 70–99)
Potassium: 4 mmol/L (ref 3.5–5.1)
Sodium: 141 mmol/L (ref 135–145)

## 2014-07-26 SURGERY — EGD (ESOPHAGOGASTRODUODENOSCOPY)
Anesthesia: Moderate Sedation

## 2014-07-26 MED ORDER — SENNOSIDES-DOCUSATE SODIUM 8.6-50 MG PO TABS
2.0000 | ORAL_TABLET | Freq: Two times a day (BID) | ORAL | Status: DC
  Administered 2014-07-26 – 2014-07-29 (×6): 2 via ORAL
  Filled 2014-07-26 (×8): qty 2

## 2014-07-26 MED ORDER — PANTOPRAZOLE SODIUM 40 MG PO TBEC: 40.0000 mg | DELAYED_RELEASE_TABLET | Freq: Every day | ORAL | Status: DC

## 2014-07-26 MED ORDER — BOOST PLUS PO LIQD
237.0000 mL | Freq: Three times a day (TID) | ORAL | Status: DC
  Administered 2014-07-26 – 2014-07-29 (×6): 237 mL via ORAL
  Filled 2014-07-26 (×14): qty 237

## 2014-07-26 MED ORDER — FENTANYL CITRATE 0.05 MG/ML IJ SOLN
INTRAMUSCULAR | Status: AC
  Filled 2014-07-26: qty 2

## 2014-07-26 MED ORDER — FENTANYL CITRATE 0.05 MG/ML IJ SOLN
INTRAMUSCULAR | Status: DC | PRN
  Administered 2014-07-26 (×2): 12.5 ug via INTRAVENOUS

## 2014-07-26 MED ORDER — PROCHLORPERAZINE EDISYLATE 5 MG/ML IJ SOLN
5.0000 mg | INTRAMUSCULAR | Status: DC | PRN
  Administered 2014-07-26 – 2014-07-29 (×3): 5 mg via INTRAVENOUS
  Administered 2014-07-29: 10 mg via INTRAVENOUS
  Filled 2014-07-26 (×5): qty 2

## 2014-07-26 MED ORDER — MIDAZOLAM HCL 5 MG/5ML IJ SOLN
INTRAMUSCULAR | Status: DC | PRN
  Administered 2014-07-26: 1 mg via INTRAVENOUS

## 2014-07-26 MED ORDER — SODIUM CHLORIDE 0.9 % IV SOLN
INTRAVENOUS | Status: DC
  Administered 2014-07-26: 1 mL via INTRAVENOUS

## 2014-07-26 MED ORDER — MIDAZOLAM HCL 5 MG/ML IJ SOLN
INTRAMUSCULAR | Status: AC
  Filled 2014-07-26: qty 1

## 2014-07-26 MED ORDER — BUTAMBEN-TETRACAINE-BENZOCAINE 2-2-14 % EX AERO
INHALATION_SPRAY | CUTANEOUS | Status: DC | PRN
Start: 2014-07-26 — End: 2014-07-26
  Administered 2014-07-26: 2 via TOPICAL

## 2014-07-26 MED ORDER — PANTOPRAZOLE SODIUM 40 MG PO TBEC
40.0000 mg | DELAYED_RELEASE_TABLET | Freq: Every day | ORAL | Status: DC
  Administered 2014-07-27: 40 mg via ORAL

## 2014-07-26 NOTE — Progress Notes (Signed)
Endoscopy again demonstrated a nonobstructing Schatzki's ring.  This was dilated to 18 mm.  If she continues to have difficulty with swallowing we can safely attribute that to a motility dysfunction.  Let's reevaluate on a soft diet

## 2014-07-26 NOTE — Progress Notes (Signed)
Daily Rounding Note  07/26/2014, 10:42 AM  LOS: 9 days   SUBJECTIVE:       Still with dysphagia and nausea.  NPO in case of EGD.   OBJECTIVE:         Vital signs in last 24 hours:    Temp:  [97.5 F (36.4 C)-98.2 F (36.8 C)] 97.6 F (36.4 C) (03/04 0501) Pulse Rate:  [58-81] 81 (03/04 0501) Resp:  [20-24] 20 (03/04 0501) BP: (163-194)/(36-52) 163/51 mmHg (03/04 0501) SpO2:  [93 %-95 %] 94 % (03/04 0501) Weight:  [110 lb 10.7 oz (50.2 kg)] 110 lb 10.7 oz (50.2 kg) (03/04 0501) Last BM Date: 07/24/14 Filed Weights   07/24/14 0449 07/25/14 0450 07/26/14 0501  Weight: 118 lb 11.2 oz (53.842 kg) 114 lb 8 oz (51.937 kg) 110 lb 10.7 oz (50.2 kg)   General: anxious, frail.     Heart: RRR Chest: clear bil. No cough or dyspnea Abdomen: soft, NT, ND.  No mass or HSM  Extremities: no CCE Neuro/Psych:  Anxiety, oriented x 3.  No limb weakness.   Intake/Output from previous day: 03/03 0701 - 03/04 0700 In: 900 [P.O.:900] Out: 1851 [Urine:1850; Emesis/NG output:1]  Intake/Output this shift:    Lab Results:  Recent Labs  07/25/14 0500 07/25/14 1412  WBC 11.8*  --   HGB 9.7* 11.6*  HCT 29.8* 34.0*  PLT 363  --    BMET  Recent Labs  07/24/14 0520 07/25/14 0500 07/25/14 1412 07/26/14 0441  NA 139 138 137 141  K 4.1 2.8* 2.6* 4.0  CL 101 96  --  97  CO2 24 31  --  32  GLUCOSE 105* 117* 141* 113*  BUN 90* 93*  --  83*  CREATININE 2.91* 2.73*  --  2.51*  CALCIUM 9.1 8.9  --  9.0   LFT No results for input(s): PROT, ALBUMIN, AST, ALT, ALKPHOS, BILITOT, BILIDIR, IBILI in the last 72 hours. PT/INR No results for input(s): LABPROT, INR in the last 72 hours. Hepatitis Panel No results for input(s): HEPBSAG, HCVAB, HEPAIGM, HEPBIGM in the last 72 hours.  Studies/Results: Dg Esophagus 07/25/2014   FINDINGS: Study is limited by the patient's limited mobility. Examination was performed in the supine semi-erect  and right anterior oblique prone positions.  Initial swallowing in the AP semi-erect position demonstrates moderate presbyesophagus. There is no evidence of impacted object within the esophagus. No mucosal lesion or fixed stricture identified.  Additional imaging was performed in the right anterior oblique prone position. The esophageal motility is similar. No mucosal lesion, stricture or impacted foreign body demonstrated. Contrast enters the stomach. A barium tablet was not administered.  IMPRESSION: 1. No evidence of impacted food within the esophagus. 2. No evidence of mucosal lesion or stricture. 3. Moderate presbyesophagus.   Electronically Signed   By: Richardean Sale M.D.   On: 07/25/2014 18:35    Scheduled Meds: . amLODipine  10 mg Oral Daily  . aspirin EC  81 mg Oral Daily  . atorvastatin  20 mg Oral Daily  . bisacodyl  10 mg Rectal Daily  . bisoprolol  10 mg Oral Daily  . cholecalciferol  1,000 Units Oral q morning - 10a  . cloNIDine  0.1 mg Oral TID  . furosemide  80 mg Oral TID  . heparin  5,000 Units Subcutaneous 3 times per day  . hydrALAZINE  100 mg Oral TID  . levothyroxine  175 mcg Oral QAC  breakfast  . multivitamin with minerals  1 tablet Oral q morning - 10a  . multivitamin-lutein  1 capsule Oral Daily  . pantoprazole  40 mg Oral BID AC  . polyvinyl alcohol  1 drop Both Eyes TID  . senna-docusate  2 tablet Oral BID  . sodium chloride  1 drop Both Eyes QHS  . sodium chloride  3 mL Intravenous Q12H   Continuous Infusions: . lactated ringers 1,000 mL (07/25/14 1357)   PRN Meds:.acetaminophen **OR** acetaminophen, albuterol, ALPRAZolam, hydrALAZINE, morphine injection, ondansetron **OR** ondansetron (ZOFRAN) IV, prochlorperazine, sodium chloride   ASSESMENT:   * Acute on chronic dysphagia. Symptoms began about 3 weeks ago, acutely worse 3/3. EGD 10/2013 showed an incidental nonobstructing Schatzki's ring. Esophagram 07/25/13 shows presbyesophagus, no stricture or  evidence that ring is causing obstruction. No food impaction.  * Anxiety. This is certainly contributing to her experience of dysphagia  *  Hypokalemia.  Resolved.   * AKI, CKD. Not a dialysis candidate.   * diastolic CHF  * Normocytic anemia. Chronic. Current Hgb of 9.7 is similar to levels in 10/2013 but lower than the 10.5 to 11.5 of October- December 2015.   * Hard to control htn, wonder if anxiety is playing a role, though pt has RAS. Unable to address RAS due to renal disease.     PLAN   *  EGD today, with possible empiric esoph dilation.  This afternoon.  D/w pt and she is agreeable.     Rachael [name]  07/26/2014, 10:42 AM Pager: [phone] GI Attending Note  I have personally taken an interval history, reviewed the chart, and examined the patient.  I agree with the extender's note, impression and recommendations.  [name]. Rachael Ina, MD, [facility] [phone]

## 2014-07-26 NOTE — Progress Notes (Signed)
OT Cancellation Note  Patient Details Name: Rachael [name] MRN: [id number] DOB: 08/23/1923   Cancelled Treatment:    Reason Eval/Treat Not Completed: Patient at procedure or test/ unavailable (endoscopy).  [name] 07/26/2014, 1:32 PM

## 2014-07-26 NOTE — Progress Notes (Signed)
Patient ID: Rachael [name], female   DOB: Dec 07, 1923, 81 y.o.   MRN: [id number] S:lethargic/somnolent O:BP 188/58 mmHg  Pulse 70  Temp(Src) 97.6 F (36.4 C) (Oral)  Resp 20  Ht 5' 8.75" (1.746 m)  Wt 50.2 kg (110 lb 10.7 oz)  BMI 16.47 kg/m2  SpO2 94%  Intake/Output Summary (Last 24 hours) at 07/26/14 1221 Last data filed at 07/26/14 0937  Gross per 24 hour  Intake    180 ml  Output   1150 ml  Net   -970 ml   Intake/Output: I/O last 3 completed shifts: In: 7 [P.O.:1480] Out: 3276 [Urine:3275; Emesis/NG output:1]  Intake/Output this shift:    Weight change: -1.737 kg (-3 lb 13.3 oz) GDJ:MEQAS,TMHDQQI WF CVS:no rub Resp:scattered Rhonchi WLN:LGXQJJ Ext:no edema   Recent Labs Lab 07/20/14 0320 07/21/14 1158 07/22/14 1040 07/23/14 0425 07/24/14 0520 07/25/14 0500 07/25/14 1412 07/26/14 0441  NA 133* 136 134* 136 139 138 137 141  K 3.7 3.7 3.6 3.2* 4.1 2.8* 2.6* 4.0  CL 101 100 99 96 101 96  --  97  CO2 22 25 23 25 24 31   --  32  GLUCOSE 109* 120* 177* 187* 105* 117* 141* 113*  BUN 83* 83* 88* 88* 90* 93*  --  83*  CREATININE 3.15* 2.91* 3.16* 2.99* 2.91* 2.73*  --  2.51*  ALBUMIN  --  3.3*  --   --   --   --   --   --   CALCIUM 8.2* 8.7 8.9 9.3 9.1 8.9  --  9.0  PHOS  --  5.6*  --   --   --   --   --   --    Liver Function Tests:  Recent Labs Lab 07/21/14 1158  ALBUMIN 3.3*   No results for input(s): LIPASE, AMYLASE in the last 168 hours. No results for input(s): AMMONIA in the last 168 hours. CBC:  Recent Labs Lab 07/25/14 0500 07/25/14 1412  WBC 11.8*  --   HGB 9.7* 11.6*  HCT 29.8* 34.0*  MCV 89.8  --   PLT 363  --    Cardiac Enzymes: No results for input(s): CKTOTAL, CKMB, CKMBINDEX, TROPONINI in the last 168 hours. CBG: No results for input(s): GLUCAP in the last 168 hours.  Iron Studies: No results for input(s): IRON, TIBC, TRANSFERRIN, FERRITIN in the last 72 hours. Studies/Results: Dg Esophagus  07/25/2014   CLINICAL DATA:   Dysphagia with foreign body sensation in throat after eating [place] toast this morning. Question food impaction. Initial encounter.  EXAM: ESOPHOGRAM/BARIUM SWALLOW  TECHNIQUE: Single contrast examination was performed using  thin barium.  FLUOROSCOPY TIME:  Examination was performed using low dose fluoroscopy. Images were recorded with fluoro-store. Number of separate spot images:None. Dose area product: 288.65 uGy*m2.  COMPARISON:  Chest radiographs 07/17/2014.  FINDINGS: Study is limited by the patient's limited mobility. Examination was performed in the supine semi-erect and right anterior oblique prone positions.  Initial swallowing in the AP semi-erect position demonstrates moderate presbyesophagus. There is no evidence of impacted object within the esophagus. No mucosal lesion or fixed stricture identified.  Additional imaging was performed in the right anterior oblique prone position. The esophageal motility is similar. No mucosal lesion, stricture or impacted foreign body demonstrated. Contrast enters the stomach. A barium tablet was not administered.  IMPRESSION: 1. No evidence of impacted food within the esophagus. 2. No evidence of mucosal lesion or stricture. 3. Moderate presbyesophagus.   Electronically Signed   By:  Richardean Sale M.D.   On: 07/25/2014 18:35   Dg Chest Port 1 View  07/26/2014   CLINICAL DATA:  Shortness of breath  EXAM: PORTABLE CHEST - 1 VIEW  COMPARISON:  07/17/2014  FINDINGS: Cardiac shadow is stable. Increasing bibasilar infiltrates and effusions are seen. Increased vascular congestion is noted with interstitial edema.  IMPRESSION: Changes consistent with CHF and increasing effusions. Increasing bibasilar infiltrates are noted particularly the left retrocardiac region.   Electronically Signed   By: [name] M.D.   On: 07/26/2014 10:44   . amLODipine  10 mg Oral Daily  . aspirin EC  81 mg Oral Daily  . atorvastatin  20 mg Oral Daily  . bisacodyl  10 mg Rectal Daily  .  bisoprolol  10 mg Oral Daily  . cholecalciferol  1,000 Units Oral q morning - 10a  . cloNIDine  0.1 mg Oral TID  . furosemide  80 mg Oral TID  . hydrALAZINE  100 mg Oral TID  . levothyroxine  175 mcg Oral QAC breakfast  . multivitamin with minerals  1 tablet Oral q morning - 10a  . multivitamin-lutein  1 capsule Oral Daily  . pantoprazole  40 mg Oral Daily  . polyvinyl alcohol  1 drop Both Eyes TID  . senna-docusate  2 tablet Oral BID  . sodium chloride  1 drop Both Eyes QHS  . sodium chloride  3 mL Intravenous Q12H    BMET    Component Value Date/Time   NA 141 07/26/2014 0441   K 4.0 07/26/2014 0441   CL 97 07/26/2014 0441   CO2 32 07/26/2014 0441   GLUCOSE 113* 07/26/2014 0441   BUN 83* 07/26/2014 0441   CREATININE 2.51* 07/26/2014 0441   CREATININE 2.01* 07/04/2014 1217   CALCIUM 9.0 07/26/2014 0441   GFRNONAA 16* 07/26/2014 0441   GFRNONAA 22* 07/04/2014 1217   GFRAA 19* 07/26/2014 0441   GFRAA 25* 07/04/2014 1217   CBC    Component Value Date/Time   WBC 11.8* 07/25/2014 0500   RBC 3.32* 07/25/2014 0500   HGB 11.6* 07/25/2014 1412   HCT 34.0* 07/25/2014 1412   PLT 363 07/25/2014 0500   MCV 89.8 07/25/2014 0500   MCH 29.2 07/25/2014 0500   MCHC 32.6 07/25/2014 0500   RDW 14.6 07/25/2014 0500   LYMPHSABS 0.5* 07/17/2014 1235   MONOABS 0.9 07/17/2014 1235   EOSABS 0.0 07/17/2014 1235   BASOSABS 0.0 07/17/2014 1235     Assessment/Plan:  1. Dysphagia with N/V/anorexia and FTT- for EGD today per GI. 2. AKI/CKD- in setting of decompensated CHF +/- R RAS. Pt is a poor dialysis candidate given her advanced age and protein malnutrition as well as her debilitated state. 1. Recommend palliative care consult to discuss goals/limits of care. 2. Scr slowly improving 3. No changes for now.  Will see her again on Monday 07/29/14 3. Acute on chronic diastolic CHF- diuresing slowly. 1. Continue with zaroxolyn  2. Change to po lasix today and follow 4. Possible right  renal artery stenosis- given severe atherosclerosis of distal aorta, high risk for intervention and would treat medically 1. Would only consider intervention if she had critical bilateral RAS (but that is doubtful in light of improving UOP, Scr, and BP). 5. Stool impaction- improved post manual disimpaction and laxatives 6. Anemia of chronic disease- will check iron stores and ESA (pending palliative care eval) 7. HTN- stable 8. Anorexia/FTT- cont with protein supplements, consider megace 9. Dispo- agree with palliative care  consult.  Amelia A

## 2014-07-26 NOTE — Progress Notes (Signed)
PT Cancellation Note  Patient Details Name: Rachael [name] MRN: [id number] DOB: 05-Sep-1923   Cancelled Treatment:    Reason Eval/Treat Not Completed: Fatigue/lethargy limiting ability to participate;Medical issues which prohibited therapy;Patient at procedure or test/unavailable.  Patient very nauseated today and declines therapy.  Patient going for procedure this pm.  Will return at a later time for PT session.   Despina Pole 07/26/2014, 12:10 PM Carita Pian. Sanjuana Kava, [place] Pager [phone]

## 2014-07-26 NOTE — Progress Notes (Signed)
Patient Demographics  Rachael [name], is a 81 y.o. female, DOB - [birth year], [id number]:[id number]  Admit date - 07/17/2014   Admitting Physician Inda Castle, MD  Outpatient Primary MD for the patient is Alesia Richards, MD  LOS - 9   Chief Complaint  Patient presents with  . Shortness of Breath      Brief summary  81 year old Caucasian female with history of diastolic diastolic dysfunction, chronic kidney disease stage V, early controlled hypertension, who was admitted to the hospital for CHF, diuresis has been in challenge due to poor renal function, cardiology renal both following S. She is still short of breath has edema and requiring oxygen. Patient was seen by palliative care, giving her worsening renal function, and she is not hemodialysis candidate, there was question of renal artery stenosis, but she is not a candidate for intervention, her hospital stay was complicated by constipation, which improved with good bowel regimen, as well she had an episode of dysphagia, she is known to have Schatzki ring and endoscopy in May 2015.    Subjective:   [name] today has, No headache, No chest pain, today has constipation and generalized abdominal pain - No Nausea, No new weakness tingling or numbness, No Cough - improvement of shortness of breath, but still there, reports her dysphagia is improved, but still having trouble swallowing.  Assessment & Plan     Acute respiratory failure, fluid overload due to acute on chronic diastolic CHF EF 86% on recent echo. Does have evidence of fluid overload, stopped IV fluids, on IV Lasix and Zaroxolyn adjusted by nephrology, be transitioned to oral Lasix and it is suspicion of discharge, continue beta blocker.  Continue supportive care and monitor.  Try to titrate off O2. Diuresis has been a challenge due to poor baseline renal function. Cardiology and renal both following.  Filed Weights   07/24/14 0449 07/25/14 0450 07/26/14 0501  Weight: 53.842 kg (118 lb 11.2 oz) 51.937 kg (114 lb 8 oz) 50.2 kg (110 lb 10.7 oz)     acute on chronic diastolic CHF EF 75% on recent echo - Improving with diuresis, diuresis is limited due to renal failure   Acute on chronic renal failure and the kidney disease stage IV. Baseline creatinine around 2, diurese and monitor. Likely due to CHF decompensation. Avoid nephrotoxins. Renal following. - Possible renal artery stenosis, high risk for intervention . - Not a candidate for hemodialysis, palliative care consult appreciated.  Dysphagia - Gastroenterology input appreciated, EGD postponed yesterday secondary to hypokalemia, for EGD today. - known Nonobstructing Schatzki ring was found 40 cm from the incisors on endoscopy last year.  Essential hypertension. Continue home medications unchanged at this point.   Hyperglycemia in the ER. Likely due to stress. Recent A1c was 5.6.    Constipation with abdominal pain. Abdominal x-ray shows no obstruction, resolved   Hypokalemia. Replaced, will recheck in a.m.( will give liquid form due to dysphagia)    Hypothyroidism. Continue home dose Synthroid    Dyslipidemia. On statin      Code Status: Full  Family Communication: Scars with multiple family members at bedside yesterday Disposition Plan: To be decided   Procedures    Consults   Cardiology. nephrology   Medications  Scheduled Meds: . amLODipine  10 mg Oral Daily  . aspirin EC  81 mg Oral Daily  . atorvastatin  20 mg Oral Daily  . bisacodyl  10 mg Rectal Daily  . bisoprolol  10 mg Oral Daily  . cholecalciferol  1,000 Units Oral q morning - 10a  . cloNIDine  0.1 mg Oral TID  . furosemide  80 mg Oral TID  . hydrALAZINE  100 mg Oral TID  . levothyroxine  175 mcg Oral QAC  breakfast  . multivitamin with minerals  1 tablet Oral q morning - 10a  . multivitamin-lutein  1 capsule Oral Daily  . pantoprazole  40 mg Oral Daily  . polyvinyl alcohol  1 drop Both Eyes TID  . senna-docusate  2 tablet Oral BID  . sodium chloride  1 drop Both Eyes QHS  . sodium chloride  3 mL Intravenous Q12H   Continuous Infusions: . sodium chloride    . lactated ringers 1,000 mL (07/25/14 1357)   PRN Meds:.acetaminophen **OR** acetaminophen, albuterol, ALPRAZolam, hydrALAZINE, morphine injection, ondansetron **OR** ondansetron (ZOFRAN) IV, prochlorperazine, sodium chloride  DVT Prophylaxis   Heparin  Lab Results  Component Value Date   PLT 363 07/25/2014    Antibiotics    Anti-infectives    None          Objective:   Filed Vitals:   07/25/14 2211 07/26/14 0501 07/26/14 1139 07/26/14 1234  BP: 188/52 163/51 188/58 130/38  Pulse: 72 81 70 69  Temp: 97.5 F (36.4 C) 97.6 F (36.4 C)  97.6 F (36.4 C)  TempSrc: Oral Oral  Oral  Resp: 20 20  20   Height:      Weight:  50.2 kg (110 lb 10.7 oz)    SpO2: 93% 94%  97%    Wt Readings from Last 3 Encounters:  07/26/14 50.2 kg (110 lb 10.7 oz)  07/15/14 55.611 kg (122 lb 9.6 oz)  07/11/14 55.883 kg (123 lb 3.2 oz)     Intake/Output Summary (Last 24 hours) at 07/26/14 1308 Last data filed at 07/26/14 0937  Gross per 24 hour  Intake    180 ml  Output   1150 ml  Net   -970 ml     Physical Exam  Awake Alert, Oriented X 3, No new F.N deficits, Normal affect Blair.AT,PERRAL Supple Neck,No JVD, No cervical lymphadenopathy appriciated.  Symmetrical Chest wall movement, Good air movement bilaterally, rales RRR,No Gallops,Rubs or new Murmurs, No Parasternal Heave +ve B.Sounds, Abd Soft, No tenderness, No organomegaly appriciated, No rebound - guarding or rigidity. No Cyanosis, Clubbing , trace edema, No new Rash or bruise     Data Review   Micro Results Recent Results (from the past 240 hour(s))  Urine  culture     Status: None   Collection Time: 07/17/14  6:49 PM  Result Value Ref Range Status   Specimen Description URINE, CLEAN CATCH  Final   Special Requests NONE  Final   Colony Count NO GROWTH Performed at [facility]   Final   Culture NO GROWTH Performed at [facility]   Final   Report Status 07/18/2014 FINAL  Final    Radiology Reports Dg Chest 2 View  07/17/2014   CLINICAL DATA:  Worsening shortness of breath over 2 days  EXAM: CHEST  2 VIEW  COMPARISON:  11/29/2013  FINDINGS: Cardiac shadow is within normal limits. Mild vascular congestion is seen. Small bilateral pleural effusions are noted but improved from the prior exam.  Bibasilar scarring is seen. Bilateral nipple shadows are noted.  IMPRESSION: Small bilateral pleural effusions.  Mild vascular congestion.   Electronically Signed   By: [name] M.D.   On: 07/17/2014 13:49   Dg Esophagus  07/25/2014   CLINICAL DATA:  Dysphagia with foreign body sensation in throat after eating [place] toast this morning. Question food impaction. Initial encounter.  EXAM: ESOPHOGRAM/BARIUM SWALLOW  TECHNIQUE: Single contrast examination was performed using  thin barium.  FLUOROSCOPY TIME:  Examination was performed using low dose fluoroscopy. Images were recorded with fluoro-store. Number of separate spot images:None. Dose area product: 288.65 uGy*m2.  COMPARISON:  Chest radiographs 07/17/2014.  FINDINGS: Study is limited by the patient's limited mobility. Examination was performed in the supine semi-erect and right anterior oblique prone positions.  Initial swallowing in the AP semi-erect position demonstrates moderate presbyesophagus. There is no evidence of impacted object within the esophagus. No mucosal lesion or fixed stricture identified.  Additional imaging was performed in the right anterior oblique prone position. The esophageal motility is similar. No mucosal lesion, stricture or impacted foreign body demonstrated.  Contrast enters the stomach. A barium tablet was not administered.  IMPRESSION: 1. No evidence of impacted food within the esophagus. 2. No evidence of mucosal lesion or stricture. 3. Moderate presbyesophagus.   Electronically Signed   By: Richardean Sale M.D.   On: 07/25/2014 18:35   Dg Chest Port 1 View  07/26/2014   CLINICAL DATA:  Shortness of breath  EXAM: PORTABLE CHEST - 1 VIEW  COMPARISON:  07/17/2014  FINDINGS: Cardiac shadow is stable. Increasing bibasilar infiltrates and effusions are seen. Increased vascular congestion is noted with interstitial edema.  IMPRESSION: Changes consistent with CHF and increasing effusions. Increasing bibasilar infiltrates are noted particularly the left retrocardiac region.   Electronically Signed   By: [name] M.D.   On: 07/26/2014 10:44   Dg Abd Portable 1v  07/23/2014   CLINICAL DATA:  Abdominal pain since last night.  EXAM: PORTABLE ABDOMEN - 1 VIEW  COMPARISON:  CT of 03/28/2007  FINDINGS: 2 supine views. Dense thoracoabdominal aortic atherosclerosis. No bowel distention. Moderate amount of descending colonic and rectosigmoid stool. Equivocal calcifications projecting over the left side of the abdomen. Favored to be vascular. Left hip arthroplasty and right proximal femoral fixation.  Small bilateral pleural effusions with adjacent airspace disease.  IMPRESSION: No bowel obstruction or free intraperitoneal air. Possible constipation.  Probable vascular calcifications projecting over the left side of the abdomen. Cannot exclude left sided renal calculi. Of note, no stones are identified on the remote CT.  Small bilateral pleural effusions with adjacent airspace disease. Suboptimally evaluated.   Electronically Signed   By: Abigail Miyamoto M.D.   On: 07/23/2014 09:12     CBC  Recent Labs Lab 07/25/14 0500 07/25/14 1412  WBC 11.8*  --   HGB 9.7* 11.6*  HCT 29.8* 34.0*  PLT 363  --   MCV 89.8  --   MCH 29.2  --   MCHC 32.6  --   RDW 14.6  --      Chemistries   Recent Labs Lab 07/22/14 1040 07/23/14 0425 07/24/14 0520 07/25/14 0500 07/25/14 1412 07/26/14 0441  NA 134* 136 139 138 137 141  K 3.6 3.2* 4.1 2.8* 2.6* 4.0  CL 99 96 101 96  --  97  CO2 23 25 24 31   --  32  GLUCOSE 177* 187* 105* 117* 141* 113*  BUN 88* 88* 90* 93*  --  83*  CREATININE 3.16* 2.99* 2.91* 2.73*  --  2.51*  CALCIUM 8.9 9.3 9.1 8.9  --  9.0   ------------------------------------------------------------------------------------------------------------------ estimated creatinine clearance is 12.3 mL/min (by C-G formula based on Cr of 2.51). ------------------------------------------------------------------------------------------------------------------ No results for input(s): HGBA1C in the last 72 hours. ------------------------------------------------------------------------------------------------------------------ No results for input(s): CHOL, HDL, LDLCALC, TRIG, CHOLHDL, LDLDIRECT in the last 72 hours. ------------------------------------------------------------------------------------------------------------------ No results for input(s): TSH, T4TOTAL, T3FREE, THYROIDAB in the last 72 hours.  Invalid input(s): FREET3 ------------------------------------------------------------------------------------------------------------------ No results for input(s): VITAMINB12, FOLATE, FERRITIN, TIBC, IRON, RETICCTPCT in the last 72 hours.  Coagulation profile No results for input(s): INR, PROTIME in the last 168 hours.  No results for input(s): DDIMER in the last 72 hours.  Cardiac Enzymes No results for input(s): CKMB, TROPONINI, MYOGLOBIN in the last 168 hours.  Invalid input(s): CK ------------------------------------------------------------------------------------------------------------------ Invalid input(s): POCBNP     Time Spent in minutes   30 minutes.   [facility], Mohit Zirbes M.D on 07/26/2014 at 1:08 PM  Between 7am to 7pm - Pager  - [phone]  After 7pm go to [email or web address] - password [facility] [place]  [facility]   Office  [phone]

## 2014-07-26 NOTE — Progress Notes (Signed)
Patient ZO:[id number] Rachael [name]      DOB: 19-Oct-1923      UJW:[id number]   Palliative Medicine Team at [facility] Progress Note    Subjective: Nauseated still today. Able to get some liquids down overnight. Not having pain. Still feels anxious. No BM.    Filed Vitals:   07/26/14 0501  BP: 163/51  Pulse: 81  Temp: 97.6 F (36.4 C)  Resp: 20   Physical exam: GEN: alert, appears uncomfortable HEENT: Rachael [name], sclera anicteric CV: regular rate LUNGS: CTAB ABD: soft, ND, hypoactive BS EXT: no edema Skin: warm/dry  CBC    Component Value Date/Time   WBC 11.8* 07/25/2014 0500   RBC 3.32* 07/25/2014 0500   HGB 11.6* 07/25/2014 1412   HCT 34.0* 07/25/2014 1412   PLT 363 07/25/2014 0500   MCV 89.8 07/25/2014 0500   MCH 29.2 07/25/2014 0500   MCHC 32.6 07/25/2014 0500   RDW 14.6 07/25/2014 0500   LYMPHSABS 0.5* 07/17/2014 1235   MONOABS 0.9 07/17/2014 1235   EOSABS 0.0 07/17/2014 1235   BASOSABS 0.0 07/17/2014 1235    CMP     Component Value Date/Time   NA 141 07/26/2014 0441   K 4.0 07/26/2014 0441   CL 97 07/26/2014 0441   CO2 32 07/26/2014 0441   GLUCOSE 113* 07/26/2014 0441   BUN 83* 07/26/2014 0441   CREATININE 2.51* 07/26/2014 0441   CREATININE 2.01* 07/04/2014 1217   CALCIUM 9.0 07/26/2014 0441   PROT 6.5 07/04/2014 1217   ALBUMIN 3.3* 07/21/2014 1158   AST 23 07/04/2014 1217   ALT 17 07/04/2014 1217   ALKPHOS 75 07/04/2014 1217   BILITOT 0.6 07/04/2014 1217   GFRNONAA 16* 07/26/2014 0441   GFRNONAA 22* 07/04/2014 1217   GFRAA 19* 07/26/2014 0441   GFRAA 25* 07/04/2014 1217     3/3 Barium Swallow IMPRESSION: 1. No evidence of impacted food within the esophagus. 2. No evidence of mucosal lesion or stricture. 3. Moderate presbyesophagus.   Assessment and plan: 81 yo female with HTN, CKD, HFpEF, RAS admitted with acute resp failure, AKI, CHF exacerbation.   1. Code Status: Full Code  2. GOC: Still very anxious today. Wonders if/when she will go  down for EGD. When I ask her understanding of her medical situation, she can tell me she was admitted for HF, but quickly focused on her difficulty swallowing. Quickly becomes anxious with this as well. Not able to have great conversation with her again today because of this. I have discussed with her family that while her renal function has shown some improvement, she remains high risk for setbacks.  Not dialysis candidate per renal. Step daughter at bedside today for support. Barium swallow does not seem to show blockage.  GI to determine need of EGD or not.    3. Symptom Management:  1. Anxiety/Agitation: xanax PRN.  2. Constipation: will add senna to docusate. Hopefully can transition off daily suppository 3. Nausea- zofran not helping. Will try PRN compazine.   4. Psychosocial/Spiritual: Widowed. No children. Main support is her niece. Reports living independently prior to admission. Had cleaning service and family help with meals.   Total Time: 25 minutes >50% of time spent in counseling and coordination of care regarding above  Doran Clay D.O. Palliative Medicine Team at [facility]  Pager: [phone] Team Phone: [phone]

## 2014-07-26 NOTE — Progress Notes (Signed)
Patient ID: Rachael [name], female   DOB: June 27, 1923, 81 y.o.   MRN: [id number]  SUBJECTIVE:  UOP continues to be improved, weight is down.  Still requiring 3 L O2 by nasal cannula.  I looked at today's CXR, still appears to be some pulmonary edema.  She is able to drink fluids and take pills today.  Barium swallow showed no stricture or food impaction.   Scheduled Meds: . amLODipine  10 mg Oral Daily  . aspirin EC  81 mg Oral Daily  . atorvastatin  20 mg Oral Daily  . bisacodyl  10 mg Rectal Daily  . bisoprolol  10 mg Oral Daily  . cholecalciferol  1,000 Units Oral q morning - 10a  . cloNIDine  0.1 mg Oral TID  . furosemide  80 mg Oral TID  . heparin  5,000 Units Subcutaneous 3 times per day  . hydrALAZINE  100 mg Oral TID  . levothyroxine  175 mcg Oral QAC breakfast  . multivitamin with minerals  1 tablet Oral q morning - 10a  . multivitamin-lutein  1 capsule Oral Daily  . pantoprazole  40 mg Oral BID AC  . polyvinyl alcohol  1 drop Both Eyes TID  . senna-docusate  2 tablet Oral BID  . sodium chloride  1 drop Both Eyes QHS  . sodium chloride  3 mL Intravenous Q12H   Continuous Infusions: . lactated ringers 1,000 mL (07/25/14 1357)   PRN Meds:.acetaminophen **OR** acetaminophen, albuterol, ALPRAZolam, hydrALAZINE, morphine injection, ondansetron **OR** ondansetron (ZOFRAN) IV, prochlorperazine, sodium chloride    Filed Vitals:   07/25/14 1352 07/25/14 1453 07/25/14 2211 07/26/14 0501  BP: 194/36 176/38 188/52 163/51  Pulse:  63 72 81  Temp: 97.5 F (36.4 C) 98.2 F (36.8 C) 97.5 F (36.4 C) 97.6 F (36.4 C)  TempSrc: Oral Axillary Oral Oral  Resp: 22 24 20 20   Height:      Weight:    110 lb 10.7 oz (50.2 kg)  SpO2: 95% 95% 93% 94%    Intake/Output Summary (Last 24 hours) at 07/26/14 1034 Last data filed at 07/26/14 0937  Gross per 24 hour  Intake    300 ml  Output   1350 ml  Net  -1050 ml    LABS: Basic Metabolic Panel:  Recent Labs  07/25/14 0500  07/25/14 1412 07/26/14 0441  NA 138 137 141  K 2.8* 2.6* 4.0  CL 96  --  97  CO2 31  --  32  GLUCOSE 117* 141* 113*  BUN 93*  --  83*  CREATININE 2.73*  --  2.51*  CALCIUM 8.9  --  9.0   Liver Function Tests: No results for input(s): AST, ALT, ALKPHOS, BILITOT, PROT, ALBUMIN in the last 72 hours. No results for input(s): LIPASE, AMYLASE in the last 72 hours. CBC:  Recent Labs  07/25/14 0500 07/25/14 1412  WBC 11.8*  --   HGB 9.7* 11.6*  HCT 29.8* 34.0*  MCV 89.8  --   PLT 363  --    Cardiac Enzymes: No results for input(s): CKTOTAL, CKMB, CKMBINDEX, TROPONINI in the last 72 hours. BNP: Invalid input(s): POCBNP D-Dimer: No results for input(s): DDIMER in the last 72 hours. Hemoglobin A1C: No results for input(s): HGBA1C in the last 72 hours. Fasting Lipid Panel: No results for input(s): CHOL, HDL, LDLCALC, TRIG, CHOLHDL, LDLDIRECT in the last 72 hours. Thyroid Function Tests: No results for input(s): TSH, T4TOTAL, T3FREE, THYROIDAB in the last 72 hours.  Invalid  input(s): FREET3 Anemia Panel: No results for input(s): VITAMINB12, FOLATE, FERRITIN, TIBC, IRON, RETICCTPCT in the last 72 hours.  RADIOLOGY: Dg Chest 2 View  07/17/2014   CLINICAL DATA:  Worsening shortness of breath over 2 days  EXAM: CHEST  2 VIEW  COMPARISON:  11/29/2013  FINDINGS: Cardiac shadow is within normal limits. Mild vascular congestion is seen. Small bilateral pleural effusions are noted but improved from the prior exam. Bibasilar scarring is seen. Bilateral nipple shadows are noted.  IMPRESSION: Small bilateral pleural effusions.  Mild vascular congestion.   Electronically Signed   By: [name] M.D.   On: 07/17/2014 13:49   Dg Esophagus  07/25/2014   CLINICAL DATA:  Dysphagia with foreign body sensation in throat after eating [place] toast this morning. Question food impaction. Initial encounter.  EXAM: ESOPHOGRAM/BARIUM SWALLOW  TECHNIQUE: Single contrast examination was performed using  thin  barium.  FLUOROSCOPY TIME:  Examination was performed using low dose fluoroscopy. Images were recorded with fluoro-store. Number of separate spot images:None. Dose area product: 288.65 uGy*m2.  COMPARISON:  Chest radiographs 07/17/2014.  FINDINGS: Study is limited by the patient's limited mobility. Examination was performed in the supine semi-erect and right anterior oblique prone positions.  Initial swallowing in the AP semi-erect position demonstrates moderate presbyesophagus. There is no evidence of impacted object within the esophagus. No mucosal lesion or fixed stricture identified.  Additional imaging was performed in the right anterior oblique prone position. The esophageal motility is similar. No mucosal lesion, stricture or impacted foreign body demonstrated. Contrast enters the stomach. A barium tablet was not administered.  IMPRESSION: 1. No evidence of impacted food within the esophagus. 2. No evidence of mucosal lesion or stricture. 3. Moderate presbyesophagus.   Electronically Signed   By: Richardean Sale M.D.   On: 07/25/2014 18:35   Dg Abd Portable 1v  07/23/2014   CLINICAL DATA:  Abdominal pain since last night.  EXAM: PORTABLE ABDOMEN - 1 VIEW  COMPARISON:  CT of 03/28/2007  FINDINGS: 2 supine views. Dense thoracoabdominal aortic atherosclerosis. No bowel distention. Moderate amount of descending colonic and rectosigmoid stool. Equivocal calcifications projecting over the left side of the abdomen. Favored to be vascular. Left hip arthroplasty and right proximal femoral fixation.  Small bilateral pleural effusions with adjacent airspace disease.  IMPRESSION: No bowel obstruction or free intraperitoneal air. Possible constipation.  Probable vascular calcifications projecting over the left side of the abdomen. Cannot exclude left sided renal calculi. Of note, no stones are identified on the remote CT.  Small bilateral pleural effusions with adjacent airspace disease. Suboptimally evaluated.    Electronically Signed   By: Abigail Miyamoto M.D.   On: 07/23/2014 09:12    PHYSICAL EXAM General: NAD Neck: JVP 8-9 cm, no thyromegaly or thyroid nodule.  Lungs: Clear to auscultation bilaterally with normal respiratory effort. CV: Nondisplaced PMI.  Heart regular S1/S2, no S3/S4, 2/6 SEM RUSB.  Trace edema at ankles bilaterally.  Abdomen: Soft, nontender, no hepatosplenomegaly, no distention.  Neurologic: Alert and oriented x 3.  Psych: Normal affect. Extremities: No clubbing or cyanosis.  Skin: Steri strips upper back.   TELEMETRY: Reviewed telemetry pt in NSR  ASSESSMENT AND PLAN: 81 yo with history of CKD, renovascular HTN, PAD including bilateral renal artery stenosis, and chronic diastolic CHF was admitted with dyspnea and AKI.  1. AKI on CKD: Difficult situation. Had been seeing PCP frequently to work on lowering her BP, which had been quite high and uncontrolled. I suspect she has renovascular  hypertension with severe right renal artery stenosis and borderline significant left renal artery stenosis. She was admitted with rise in creatinine from 1.7 => 3.Concern that  BP lowering in the face of possible bilateral significant renal artery stenosis worsened renal function acutely. She was also volume overloaded (got IV fluid initially) with creatinine still elevated.  Discussed situation with Drs Marval Regal and Fletcher Anon.  Only reason to intervene on renal artery stenosis would be if we had hemodynamically significant bilateral renal artery stenosis that is causing renal dysfunction.  However, we only know that the artery to one side has definite hemodynamically significant stenosis.  Given high risk of intervention, would hold off.  UOP and creatinine improving.  2. HTN: Poor control historically with possible renovascular HTN. See discussion above about concern that aggressive BP control will worsen GFR with possible bilateral RAS. Would aim to let SBP run around 150. BP currently  stable.  Now off minoxidil. No changes to BP regimen today.  3. Acute on chronic diastolic CHF: Slow improvement.  Weight down, diuresed reasonably yesterday.  Still on oxygen with some pulmonary congestion on CXR.  Would continue Lasix 80 mg po tid today, has diuresed reasonably on this regimen.  Likely home on Lasix 80 mg po bid. 4. Dysphagia: Barium swallow relatively unremarkable.  I think anxiety may play a role here.  GI following.  Think benzodiazepine treatment may help as recommended by palliative care.   Loralie Champagne 07/26/2014 10:34 AM

## 2014-07-27 DIAGNOSIS — Q394 Esophageal web: Secondary | ICD-10-CM

## 2014-07-27 LAB — CBC
HEMATOCRIT: 35.3 % — AB (ref 36.0–46.0)
Hemoglobin: 11.4 g/dL — ABNORMAL LOW (ref 12.0–15.0)
MCH: 28.9 pg (ref 26.0–34.0)
MCHC: 32.3 g/dL (ref 30.0–36.0)
MCV: 89.6 fL (ref 78.0–100.0)
PLATELETS: 416 10*3/uL — AB (ref 150–400)
RBC: 3.94 MIL/uL (ref 3.87–5.11)
RDW: 14.5 % (ref 11.5–15.5)
WBC: 11.2 10*3/uL — ABNORMAL HIGH (ref 4.0–10.5)

## 2014-07-27 LAB — RENAL FUNCTION PANEL
ANION GAP: 14 (ref 5–15)
Albumin: 3.4 g/dL — ABNORMAL LOW (ref 3.5–5.2)
BUN: 78 mg/dL — AB (ref 6–23)
CO2: 35 mmol/L — AB (ref 19–32)
CREATININE: 2.18 mg/dL — AB (ref 0.50–1.10)
Calcium: 9.1 mg/dL (ref 8.4–10.5)
Chloride: 95 mmol/L — ABNORMAL LOW (ref 96–112)
GFR calc Af Amer: 22 mL/min — ABNORMAL LOW (ref >90)
GFR calc non Af Amer: 19 mL/min — ABNORMAL LOW (ref >90)
Glucose, Bld: 108 mg/dL — ABNORMAL HIGH (ref 70–99)
PHOSPHORUS: 4.3 mg/dL (ref 2.3–4.6)
Potassium: 2.8 mmol/L — ABNORMAL LOW (ref 3.5–5.1)
SODIUM: 144 mmol/L (ref 135–145)

## 2014-07-27 MED ORDER — FUROSEMIDE 10 MG/ML IJ SOLN
60.0000 mg | Freq: Once | INTRAMUSCULAR | Status: AC
Start: 2014-07-27 — End: 2014-07-27
  Administered 2014-07-27: 60 mg via INTRAVENOUS

## 2014-07-27 MED ORDER — ONDANSETRON HCL 4 MG/2ML IJ SOLN
4.0000 mg | Freq: Once | INTRAMUSCULAR | Status: AC
  Administered 2014-07-27: 4 mg via INTRAVENOUS

## 2014-07-27 MED ORDER — POTASSIUM CHLORIDE 20 MEQ PO PACK
40.0000 meq | PACK | ORAL | Status: DC
  Filled 2014-07-27 (×3): qty 2

## 2014-07-27 MED ORDER — NEPRO/CARBSTEADY PO LIQD
237.0000 mL | Freq: Three times a day (TID) | ORAL | Status: DC
  Administered 2014-07-27 – 2014-07-28 (×3): 237 mL via ORAL
  Filled 2014-07-27 (×10): qty 237

## 2014-07-27 MED ORDER — POTASSIUM CHLORIDE CRYS ER 20 MEQ PO TBCR
40.0000 meq | EXTENDED_RELEASE_TABLET | ORAL | Status: AC
  Administered 2014-07-27 (×3): 40 meq via ORAL
  Filled 2014-07-27 (×2): qty 2

## 2014-07-27 NOTE — Progress Notes (Signed)
Patient Demographics  Rachael [name], is a 81 y.o. female, DOB - [birth year], ZOX:[id number]  Admit date - 07/17/2014   Admitting Physician Inda Castle, MD  Outpatient Primary MD for the patient is Alesia Richards, MD  LOS - 10   Chief Complaint  Patient presents with  . Shortness of Breath      Brief summary  81 year old Caucasian female with history of diastolic diastolic dysfunction, chronic kidney disease stage V, early controlled hypertension, who was admitted to the hospital for CHF, diuresis has been in challenge due to poor renal function, cardiology renal both following S. She is still short of breath has edema and requiring oxygen. Patient was seen by palliative care, giving her worsening renal function, and she is not hemodialysis candidate, there was question of renal artery stenosis, but she is not a candidate for intervention, her hospital stay was complicated by constipation, which improved with good bowel regimen, as well she had an episode of dysphagia, she is known to have Schatzki ring and endoscopy in May 2015. patient had endoscopy on 3/4, demonstrated a nonobstructing Schatzki's ring. This was dilated to 18 mm.    Subjective:   Rachael [name] today has, No headache, No chest pain, today has constipation and generalized abdominal pain - , No new weakness tingling or numbness, No Cough - improvement of shortness of breath, but still there, reports her dysphagia is improved, still complains of nausea  Assessment & Plan     Acute respiratory failure,  - fluid overload due to acute on chronic diastolic CHF EF 81% on recent echo.  - initially  on IV Lasix , transitioned to oral, adjusted by nephrology, continue beta blocker.  Continue supportive care and monitor. Try to  titrate off O2. Diuresis has been a challenge due to poor baseline renal function. Cardiology and renal both following.  Filed Weights   07/25/14 0450 07/26/14 0501 07/27/14 0507  Weight: 51.937 kg (114 lb 8 oz) 50.2 kg (110 lb 10.7 oz) 49 kg (108 lb 0.4 oz)     acute on chronic diastolic CHF EF 19% on recent echo - Improving with diuresis, diuresis is limited due to renal failure, still with evidence of volume overload.   Acute on chronic renal failure and  kidney disease stage IV. Baseline creatinine around 2, diurese and monitor. Likely due to CHF decompensation. Avoid nephrotoxins. Renal following. - Possible renal artery stenosis, high risk for intervention . - Not a candidate for hemodialysis, palliative care consult appreciated.  Dysphagia - Gastroenterology input appreciated. - endoscopy on 3/4, demonstrated a nonobstructing Schatzki's ring. This was dilated to 18 mm. - Tolerating soft diet.  Essential hypertension.  Continue home medications unchanged at this point,   Hyperglycemia in the ER. Likely due to stress. Recent A1c was 5.6.    Constipation with abdominal pain. Abdominal x-ray shows no obstruction, resolved   Hypokalemia. - Replaced, due to diuresis ,will recheck in a.m.    Hypothyroidism. Continue home dose Synthroid    Dyslipidemia. On statin      Code Status: DNR. D/W patient and daughter at bedside, they confirm patient is DO NOT RESUSCITATE . Family Communication:Daughter at bedside  Disposition Plan:Coe stated case management to discuss with family, and hospice evaluation  Procedures  endoscopy on 3/4, demonstrated a nonobstructing Schatzki's ring. This was dilated to 18 mm.   [place]   Cardiology. nephrology Palliative Gastroenterology  Medications  Scheduled Meds: . amLODipine  10 mg Oral Daily  . aspirin EC  81 mg Oral Daily  . atorvastatin  20 mg Oral Daily  . bisacodyl  10 mg Rectal Daily  . bisoprolol  10 mg Oral Daily   . cholecalciferol  1,000 Units Oral q morning - 10a  . cloNIDine  0.1 mg Oral TID  . feeding supplement (NEPRO CARB STEADY)  237 mL Oral TID WC  . furosemide  80 mg Oral TID  . hydrALAZINE  100 mg Oral TID  . lactose free nutrition  237 mL Oral TID BM  . levothyroxine  175 mcg Oral QAC breakfast  . multivitamin with minerals  1 tablet Oral q morning - 10a  . multivitamin-lutein  1 capsule Oral Daily  . pantoprazole  40 mg Oral Daily  . polyvinyl alcohol  1 drop Both Eyes TID  . potassium chloride  40 mEq Oral Q3H  . senna-docusate  2 tablet Oral BID  . sodium chloride  1 drop Both Eyes QHS  . sodium chloride  3 mL Intravenous Q12H   Continuous Infusions:   PRN Meds:.acetaminophen **OR** acetaminophen, albuterol, ALPRAZolam, hydrALAZINE, morphine injection, ondansetron **OR** ondansetron (ZOFRAN) IV, prochlorperazine, sodium chloride  DVT Prophylaxis   Heparin  Lab Results  Component Value Date   PLT 416* 07/27/2014    Antibiotics    Anti-infectives    None          Objective:   Filed Vitals:   07/26/14 1450 07/26/14 1801 07/26/14 2249 07/27/14 0507  BP: 163/74 195/54 188/76 166/53  Pulse:  60 62 70  Temp:   97.5 F (36.4 C) 98.2 F (36.8 C)  TempSrc:   Oral Oral  Resp:   18 20  Height:      Weight:    49 kg (108 lb 0.4 oz)  SpO2:   98% 99%    Wt Readings from Last 3 Encounters:  07/27/14 49 kg (108 lb 0.4 oz)  07/15/14 55.611 kg (122 lb 9.6 oz)  07/11/14 55.883 kg (123 lb 3.2 oz)     Intake/Output Summary (Last 24 hours) at 07/27/14 1307 Last data filed at 07/27/14 1004  Gross per 24 hour  Intake   1137 ml  Output   1600 ml  Net   -463 ml     Physical Exam  Awake Alert, Oriented X 3,  frail, elderly female ,No new F.N deficits, Normal affect [place].AT,PERRAL Supple Neck,No JVD, No cervical lymphadenopathy appriciated.  Symmetrical Chest wall movement, Good air movement bilaterally, rales RRR,No Gallops,Rubs or new Murmurs, No Parasternal  Heave +ve B.Sounds, Abd Soft, No tenderness, No organomegaly appriciated, No rebound - guarding or rigidity. No Cyanosis, Clubbing , trace edema, No new Rash or bruise     Data Review   Micro Results Recent Results (from the past 240 hour(s))  Urine culture     Status: None   Collection Time: 07/17/14  6:49 PM  Result Value Ref Range Status   Specimen Description URINE, CLEAN CATCH  Final   Special Requests NONE  Final   Colony Count NO GROWTH Performed at [facility]   Final   Culture NO GROWTH Performed at [facility]   Final   Report Status 07/18/2014 FINAL  Final    Radiology Reports Dg Chest 2 View  07/17/2014   CLINICAL DATA:  Worsening shortness of breath over 2 days  EXAM: CHEST  2 VIEW  COMPARISON:  11/29/2013  FINDINGS: Cardiac shadow is within normal limits. Mild vascular congestion is seen. Small bilateral pleural effusions are noted but improved from the prior exam. Bibasilar scarring is seen. Bilateral nipple shadows are noted.  IMPRESSION: Small bilateral pleural effusions.  Mild vascular congestion.   Electronically Signed   By: [name] M.D.   On: 07/17/2014 13:49   Dg Esophagus  07/25/2014   CLINICAL DATA:  Dysphagia with foreign body sensation in throat after eating [place] toast this morning. Question food impaction. Initial encounter.  EXAM: ESOPHOGRAM/BARIUM SWALLOW  TECHNIQUE: Single contrast examination was performed using  thin barium.  FLUOROSCOPY TIME:  Examination was performed using low dose fluoroscopy. Images were recorded with fluoro-store. Number of separate spot images:None. Dose area product: 288.65 uGy*m2.  COMPARISON:  Chest radiographs 07/17/2014.  FINDINGS: Study is limited by the patient's limited mobility. Examination was performed in the supine semi-erect and right anterior oblique prone positions.  Initial swallowing in the AP semi-erect position demonstrates moderate presbyesophagus. There is no evidence of impacted object  within the esophagus. No mucosal lesion or fixed stricture identified.  Additional imaging was performed in the right anterior oblique prone position. The esophageal motility is similar. No mucosal lesion, stricture or impacted foreign body demonstrated. Contrast enters the stomach. A barium tablet was not administered.  IMPRESSION: 1. No evidence of impacted food within the esophagus. 2. No evidence of mucosal lesion or stricture. 3. Moderate presbyesophagus.   Electronically Signed   By: Richardean Sale M.D.   On: 07/25/2014 18:35   Dg Chest Port 1 View  07/26/2014   CLINICAL DATA:  Shortness of breath  EXAM: PORTABLE CHEST - 1 VIEW  COMPARISON:  07/17/2014  FINDINGS: Cardiac shadow is stable. Increasing bibasilar infiltrates and effusions are seen. Increased vascular congestion is noted with interstitial edema.  IMPRESSION: Changes consistent with CHF and increasing effusions. Increasing bibasilar infiltrates are noted particularly the left retrocardiac region.   Electronically Signed   By: [name] M.D.   On: 07/26/2014 10:44   Dg Abd Portable 1v  07/23/2014   CLINICAL DATA:  Abdominal pain since last night.  EXAM: PORTABLE ABDOMEN - 1 VIEW  COMPARISON:  CT of 03/28/2007  FINDINGS: 2 supine views. Dense thoracoabdominal aortic atherosclerosis. No bowel distention. Moderate amount of descending colonic and rectosigmoid stool. Equivocal calcifications projecting over the left side of the abdomen. Favored to be vascular. Left hip arthroplasty and right proximal femoral fixation.  Small bilateral pleural effusions with adjacent airspace disease.  IMPRESSION: No bowel obstruction or free intraperitoneal air. Possible constipation.  Probable vascular calcifications projecting over the left side of the abdomen. Cannot exclude left sided renal calculi. Of note, no stones are identified on the remote CT.  Small bilateral pleural effusions with adjacent airspace disease. Suboptimally evaluated.   Electronically  Signed   By: Abigail Miyamoto M.D.   On: 07/23/2014 09:12     CBC  Recent Labs Lab 07/25/14 0500 07/25/14 1412 07/27/14 0339  WBC 11.8*  --  11.2*  HGB 9.7* 11.6* 11.4*  HCT 29.8* 34.0* 35.3*  PLT 363  --  416*  MCV 89.8  --  89.6  MCH 29.2  --  28.9  MCHC 32.6  --  32.3  RDW 14.6  --  14.5    Chemistries   Recent Labs Lab 07/23/14 0425 07/24/14 0520 07/25/14 0500  07/25/14 1412 07/26/14 0441 07/27/14 0339  NA 136 139 138 137 141 144  K 3.2* 4.1 2.8* 2.6* 4.0 2.8*  CL 96 101 96  --  97 95*  CO2 25 24 31   --  32 35*  GLUCOSE 187* 105* 117* 141* 113* 108*  BUN 88* 90* 93*  --  83* 78*  CREATININE 2.99* 2.91* 2.73*  --  2.51* 2.18*  CALCIUM 9.3 9.1 8.9  --  9.0 9.1   ------------------------------------------------------------------------------------------------------------------ estimated creatinine clearance is 13.8 mL/min (by C-G formula based on Cr of 2.18). ------------------------------------------------------------------------------------------------------------------ No results for input(s): HGBA1C in the last 72 hours. ------------------------------------------------------------------------------------------------------------------ No results for input(s): CHOL, HDL, LDLCALC, TRIG, CHOLHDL, LDLDIRECT in the last 72 hours. ------------------------------------------------------------------------------------------------------------------ No results for input(s): TSH, T4TOTAL, T3FREE, THYROIDAB in the last 72 hours.  Invalid input(s): FREET3 ------------------------------------------------------------------------------------------------------------------ No results for input(s): VITAMINB12, FOLATE, FERRITIN, TIBC, IRON, RETICCTPCT in the last 72 hours.  Coagulation profile No results for input(s): INR, PROTIME in the last 168 hours.  No results for input(s): DDIMER in the last 72 hours.  Cardiac Enzymes No results for input(s): CKMB, TROPONINI, MYOGLOBIN in the  last 168 hours.  Invalid input(s): CK ------------------------------------------------------------------------------------------------------------------ Invalid input(s): POCBNP     Time Spent in minutes   30 minutes.   [facility], DAWOOD M.D on 07/27/2014 at 1:07 PM  Between 7am to 7pm - Pager - [phone]  After 7pm go to [email or web address] - password [facility]  [facility]   Office  [phone]

## 2014-07-27 NOTE — Progress Notes (Addendum)
SUBJECTIVE: Pt resting comfortably. Daughter in room who endorses patient drank fluids adequately, but did not eat much. She had been complaining of nausea. Potassium noted to be low and while repletement therapy has been ordered, it has not been given yet.     Intake/Output Summary (Last 24 hours) at 07/27/14 1218 Last data filed at 07/27/14 1004  Gross per 24 hour  Intake   1137 ml  Output   1600 ml  Net   -463 ml    Current Facility-Administered Medications  Medication Dose Route Frequency Provider Last Rate Last Dose  . acetaminophen (TYLENOL) tablet 650 mg  650 mg Oral Q6H PRN Melton Alar, PA-C   650 mg at 07/27/14 4315   Or  . acetaminophen (TYLENOL) suppository 650 mg  650 mg Rectal Q6H PRN Melton Alar, PA-C      . albuterol (PROVENTIL) (2.5 MG/3ML) 0.083% nebulizer solution 2.5 mg  2.5 mg Inhalation Q6H PRN Nishant Dhungel, MD      . ALPRAZolam Duanne Moron) tablet 0.25 mg  0.25 mg Oral TID PRN Phillips Climes, MD   0.25 mg at 07/27/14 0935  . amLODipine (NORVASC) tablet 10 mg  10 mg Oral Daily Melton Alar, PA-C   10 mg at 07/27/14 1033  . aspirin EC tablet 81 mg  81 mg Oral Daily Melton Alar, PA-C   81 mg at 07/27/14 1032  . atorvastatin (LIPITOR) tablet 20 mg  20 mg Oral Daily Melton Alar, PA-C   20 mg at 07/27/14 1033  . bisacodyl (DULCOLAX) suppository 10 mg  10 mg Rectal Daily Thurnell Lose, MD   10 mg at 07/27/14 1000  . bisoprolol (ZEBETA) tablet 10 mg  10 mg Oral Daily Melton Alar, PA-C   10 mg at 07/27/14 1034  . cholecalciferol (VITAMIN D) tablet 1,000 Units  1,000 Units Oral q morning - 10a Melton Alar, PA-C   1,000 Units at 07/27/14 1033  . cloNIDine (CATAPRES) tablet 0.1 mg  0.1 mg Oral TID Larey Dresser, MD   0.1 mg at 07/27/14 1033  . furosemide (LASIX) tablet 80 mg  80 mg Oral TID Donato Heinz, MD   80 mg at 07/27/14 1033  . hydrALAZINE (APRESOLINE) injection 10 mg  10 mg Intravenous Q4H PRN Phillips Climes, MD      .  hydrALAZINE (APRESOLINE) tablet 100 mg  100 mg Oral TID Melton Alar, PA-C   100 mg at 07/27/14 1032  . lactose free nutrition (BOOST PLUS) liquid 237 mL  237 mL Oral TID BM Vena Rua, PA-C   237 mL at 07/26/14 2245  . levothyroxine (SYNTHROID, LEVOTHROID) tablet 175 mcg  175 mcg Oral QAC breakfast Melton Alar, PA-C   175 mcg at 07/27/14 0555  . morphine 2 MG/ML injection 2 mg  2 mg Intravenous Q4H PRN Thurnell Lose, MD   2 mg at 07/25/14 1223  . multivitamin with minerals tablet 1 tablet  1 tablet Oral q morning - 10a Melton Alar, PA-C   1 tablet at 07/27/14 1033  . multivitamin-lutein (OCUVITE-LUTEIN) capsule 1 capsule  1 capsule Oral Daily Nishant Dhungel, MD   1 capsule at 07/27/14 1035  . ondansetron (ZOFRAN) tablet 4 mg  4 mg Oral Q6H PRN Melton Alar, PA-C       Or  . ondansetron (ZOFRAN) injection 4 mg  4 mg Intravenous Q6H PRN Melton Alar, PA-C   4  mg at 07/27/14 0956  . pantoprazole (PROTONIX) EC tablet 40 mg  40 mg Oral Daily Vena Rua, PA-C   40 mg at 07/27/14 1000  . polyvinyl alcohol (LIQUIFILM TEARS) 1.4 % ophthalmic solution 1 drop  1 drop Both Eyes TID Nishant Dhungel, MD   1 drop at 07/27/14 1000  . potassium chloride SA (K-DUR,KLOR-CON) CR tablet 40 mEq  40 mEq Oral Q3H Dawood Elgergawy, MD      . prochlorperazine (COMPAZINE) injection 5-10 mg  5-10 mg Intravenous Q4H PRN Brock Ra Lampkin, DO   5 mg at 07/26/14 1131  . senna-docusate (Senokot-S) tablet 2 tablet  2 tablet Oral BID Brock Ra Lampkin, DO   2 tablet at 07/27/14 1034  . sodium chloride (MURO 128) 5 % ophthalmic solution 1 drop  1 drop Both Eyes QHS Melton Alar, PA-C   1 drop at 07/26/14 1801  . sodium chloride (OCEAN) 0.65 % nasal spray 1 spray  1 spray Each Nare PRN Thurnell Lose, MD   1 spray at 07/22/14 0901  . sodium chloride 0.9 % injection 3 mL  3 mL Intravenous Q12H Melton Alar, PA-C   3 mL at 07/27/14 1000    Filed Vitals:   07/26/14 1450 07/26/14 1801 07/26/14  2249 07/27/14 0507  BP: 163/74 195/54 188/76 166/53  Pulse:  60 62 70  Temp:   97.5 F (36.4 C) 98.2 F (36.8 C)  TempSrc:   Oral Oral  Resp:   18 20  Height:      Weight:    108 lb 0.4 oz (49 kg)  SpO2:   98% 99%    PHYSICAL EXAM General: NAD, asleep HEENT: Normal. Neck: No JVD, no thyromegaly.  Lungs: Diminished anteriorly. CV: Nondisplaced PMI.  Mostly regular rhythm with frequent premature contractions, normal S1/S2, no S3/S4, 2/6 ejection systolic murmur at right upper sternal border.  No pretibial edema.   Abdomen: Soft, no distention.  Neurologic: Deferred as pt asleep.  Musculoskeletal: No gross deformities. Extremities: No clubbing or cyanosis.   TELEMETRY: Reviewed telemetry pt in sinus rhythm with frequent PVC's.  LABS: Basic Metabolic Panel:  Recent Labs  07/26/14 0441 07/27/14 0339  NA 141 144  K 4.0 2.8*  CL 97 95*  CO2 32 35*  GLUCOSE 113* 108*  BUN 83* 78*  CREATININE 2.51* 2.18*  CALCIUM 9.0 9.1  PHOS  --  4.3   Liver Function Tests:  Recent Labs  07/27/14 0339  ALBUMIN 3.4*   No results for input(s): LIPASE, AMYLASE in the last 72 hours. CBC:  Recent Labs  07/25/14 0500 07/25/14 1412 07/27/14 0339  WBC 11.8*  --  11.2*  HGB 9.7* 11.6* 11.4*  HCT 29.8* 34.0* 35.3*  MCV 89.8  --  89.6  PLT 363  --  416*   Cardiac Enzymes: No results for input(s): CKTOTAL, CKMB, CKMBINDEX, TROPONINI in the last 72 hours. BNP: Invalid input(s): POCBNP D-Dimer: No results for input(s): DDIMER in the last 72 hours. Hemoglobin A1C: No results for input(s): HGBA1C in the last 72 hours. Fasting Lipid Panel: No results for input(s): CHOL, HDL, LDLCALC, TRIG, CHOLHDL, LDLDIRECT in the last 72 hours. Thyroid Function Tests: No results for input(s): TSH, T4TOTAL, T3FREE, THYROIDAB in the last 72 hours.  Invalid input(s): FREET3 Anemia Panel: No results for input(s): VITAMINB12, FOLATE, FERRITIN, TIBC, IRON, RETICCTPCT in the last 72  hours.  RADIOLOGY: Dg Chest 2 View  07/17/2014   CLINICAL DATA:  Worsening shortness of breath  over 2 days  EXAM: CHEST  2 VIEW  COMPARISON:  11/29/2013  FINDINGS: Cardiac shadow is within normal limits. Mild vascular congestion is seen. Small bilateral pleural effusions are noted but improved from the prior exam. Bibasilar scarring is seen. Bilateral nipple shadows are noted.  IMPRESSION: Small bilateral pleural effusions.  Mild vascular congestion.   Electronically Signed   By: [name] M.D.   On: 07/17/2014 13:49   Dg Esophagus  07/25/2014   CLINICAL DATA:  Dysphagia with foreign body sensation in throat after eating [place] toast this morning. Question food impaction. Initial encounter.  EXAM: ESOPHOGRAM/BARIUM SWALLOW  TECHNIQUE: Single contrast examination was performed using  thin barium.  FLUOROSCOPY TIME:  Examination was performed using low dose fluoroscopy. Images were recorded with fluoro-store. Number of separate spot images:None. Dose area product: 288.65 uGy*m2.  COMPARISON:  Chest radiographs 07/17/2014.  FINDINGS: Study is limited by the patient's limited mobility. Examination was performed in the supine semi-erect and right anterior oblique prone positions.  Initial swallowing in the AP semi-erect position demonstrates moderate presbyesophagus. There is no evidence of impacted object within the esophagus. No mucosal lesion or fixed stricture identified.  Additional imaging was performed in the right anterior oblique prone position. The esophageal motility is similar. No mucosal lesion, stricture or impacted foreign body demonstrated. Contrast enters the stomach. A barium tablet was not administered.  IMPRESSION: 1. No evidence of impacted food within the esophagus. 2. No evidence of mucosal lesion or stricture. 3. Moderate presbyesophagus.   Electronically Signed   By: Richardean Sale M.D.   On: 07/25/2014 18:35   Dg Chest Port 1 View  07/26/2014   CLINICAL DATA:  Shortness of breath   EXAM: PORTABLE CHEST - 1 VIEW  COMPARISON:  07/17/2014  FINDINGS: Cardiac shadow is stable. Increasing bibasilar infiltrates and effusions are seen. Increased vascular congestion is noted with interstitial edema.  IMPRESSION: Changes consistent with CHF and increasing effusions. Increasing bibasilar infiltrates are noted particularly the left retrocardiac region.   Electronically Signed   By: [name] M.D.   On: 07/26/2014 10:44   Dg Abd Portable 1v  07/23/2014   CLINICAL DATA:  Abdominal pain since last night.  EXAM: PORTABLE ABDOMEN - 1 VIEW  COMPARISON:  CT of 03/28/2007  FINDINGS: 2 supine views. Dense thoracoabdominal aortic atherosclerosis. No bowel distention. Moderate amount of descending colonic and rectosigmoid stool. Equivocal calcifications projecting over the left side of the abdomen. Favored to be vascular. Left hip arthroplasty and right proximal femoral fixation.  Small bilateral pleural effusions with adjacent airspace disease.  IMPRESSION: No bowel obstruction or free intraperitoneal air. Possible constipation.  Probable vascular calcifications projecting over the left side of the abdomen. Cannot exclude left sided renal calculi. Of note, no stones are identified on the remote CT.  Small bilateral pleural effusions with adjacent airspace disease. Suboptimally evaluated.   Electronically Signed   By: Abigail Miyamoto M.D.   On: 07/23/2014 09:12      ASSESSMENT AND PLAN: 81 yo with history of CKD, renovascular HTN, PAD including bilateral renal artery stenosis, and chronic diastolic CHF was admitted with dyspnea and AKI.   1. AKI on CKD: Followed by nephrology. Reportedly had been seeing PCP frequently to work on lowering her BP, which had been quite high and uncontrolled. She appears to have renovascular hypertension with severe right renal artery stenosis and borderline significant left renal artery stenosis. She was admitted with rise in creatinine from 1.7 => 3.Concern that BP  lowering in the face of possible bilateral significant renal artery stenosis worsened renal function acutely. She was also volume overloaded (got IV fluid initially) with creatinine still elevated but down to 2.18 from 2.51. Dr. Aundra Dubin previously discussed situation with Drs Marval Regal and Fletcher Anon. Only reason to intervene on renal artery stenosis would be if there were hemodynamically significant bilateral renal artery stenosis that is causing renal dysfunction. However, what is known is that the artery to one side has definite hemodynamically significant stenosis. Given high risk of intervention, would hold off. Creatinine improving. Has not had significant diuretic response with only 400 cc in past 24 hours.  2. Renovascular HTN: Poor control historically with probable renovascular HTN. See discussion above about concern that aggressive BP control will worsen GFR with possible bilateral RAS. Would aim to let SBP run around 150. BP currently stable in 160 mmHg range. Now off minoxidil. No changes to BP regimen today.   3. Acute on chronic diastolic CHF: Very slow improvement.Did not have much urine output in last 24 hours. Still on oxygen with some pulmonary congestion on CXR on 3/4. Would continue Lasix 80 mg po tid today, as she was previously diuresing reasonably well on this regimen. Likely home on Lasix 80 mg po bid.  4. Dysphagia: s/p dilatation for Schatzki's ring. Able to take oral meds.   5. Frequent PVC's: Due to marked hypokalemia. Awaiting KCl repletement.  6. Dispo: Agree with palliative care.  Kate Sable, M.D., F.A.C.C.

## 2014-07-27 NOTE — Progress Notes (Signed)
CARE MANAGEMENT NOTE 07/27/2014  Patient:  Rachael [name], Rachael [name]   Account Number:  [id number]  Date Initiated:  07/19/2014  Documentation initiated by:  AMERSON,JULIE  Subjective/Objective Assessment:   Pt adm on 07/17/14 with CHF.  PTA, pt lives alone and is independent.     Action/Plan:   Pt for likely dc over the weekend.  Will need HH follow up. She prefers HH with [place]; referral to [facility] with [facility] agency.  Start of care 24-48h post dc date.   Anticipated DC Date:  07/21/2014   Anticipated DC Plan:  [place]  CM consult      [facility] Choice  HOME HEALTH   Choice offered to / List presented to:  C-1 Patient        [place] arranged  HH-1 RN  [facility]      [facility] agency  [facility]   Status of service:  Completed, signed off Medicare Important Message given?  YES (If response is "NO", the following Medicare IM given date fields will be blank) Date Medicare IM given:  07/22/2014 Medicare IM given by:  [facility] Date Additional Medicare IM given:   Additional Medicare IM given by:    Discharge Disposition:  [place]  Per UR Regulation:  Reviewed for med. necessity/level of care/duration of stay  If discussed at [place] of Stay Meetings, dates discussed:    Comments:  07/27/14 17:30 Cm met with pt in room and step-daughter Rachael [name].  This is third attempt to speak with pt but she is having persistent emesis and is unable to participate. Rachael [name] [phone] and I spokeof the options.  Rachael [name] states home is out of the question  because there is no one to take care of pt and she lives alone.  This environment would not be appropriate for home health or home hospice bc the family cannot afford full time caretaking.  Rachael [name] would like to speak with the CSW alone and then together to present the plan of SNF/Residential Hospice to the patient. Rachael [name] states she believes her mother  has a negative impression of SNF/Residential Hospice.  CM has placed referal to CSW.  No other CM needs were communicated. Rachael [name], BSN, Cm [phone].  07/22/2014 1130 NCM spoke to pt and states she still drives. Plans to pursue dialysis 3 days per week. Notified [facility] [place] orders in system for home. Waiting final dc disposition. NCM will continue to follow up for dc needs. Will follow up on oxygen needs for home.  Rachael Finner RN CCM Case Mgmt phone [phone]   friend Rachael [name] [phone] nephew  Rachael [name] [phone]

## 2014-07-27 NOTE — Progress Notes (Signed)
Progress Note   Subjective  *States dysphagia is improved.  C/o mild nausea**   Objective  Vital signs in last 24 hours: Temp:  [97.5 F (36.4 C)-98.2 F (36.8 C)] 98.2 F (36.8 C) (03/05 0507) Pulse Rate:  [45-80] 70 (03/05 0507) Resp:  [12-23] 20 (03/05 0507) BP: (160-195)/(35-87) 166/53 mmHg (03/05 0507) SpO2:  [93 %-99 %] 99 % (03/05 0507) Weight:  [108 lb 0.4 oz (49 kg)] 108 lb 0.4 oz (49 kg) (03/05 0507) Last BM Date: 07/24/14 General:   Alert,  Elderly female in NAD  Neurologic:  Alert and  oriented x4;  grossly normal neurologically. Psych:  Alert and cooperative. Normal mood and affect.  Intake/Output from previous day: 03/04 0701 - 03/05 0700 In: 1197 [P.O.:1137; I.V.:60] Out: 1400 [Urine:1400] Intake/Output this shift: Total I/O In: 0  Out: 200 [Urine:200]  Lab Results:  Recent Labs  07/25/14 0500 07/25/14 1412 07/27/14 0339  WBC 11.8*  --  11.2*  HGB 9.7* 11.6* 11.4*  HCT 29.8* 34.0* 35.3*  PLT 363  --  416*   BMET  Recent Labs  07/25/14 0500 07/25/14 1412 07/26/14 0441 07/27/14 0339  NA 138 137 141 144  K 2.8* 2.6* 4.0 2.8*  CL 96  --  97 95*  CO2 31  --  32 35*  GLUCOSE 117* 141* 113* 108*  BUN 93*  --  83* 78*  CREATININE 2.73*  --  2.51* 2.18*  CALCIUM 8.9  --  9.0 9.1   LFT  Recent Labs  07/27/14 0339  ALBUMIN 3.4*   PT/INR No results for input(s): LABPROT, INR in the last 72 hours. Hepatitis Panel No results for input(s): HEPBSAG, HCVAB, HEPAIGM, HEPBIGM in the last 72 hours.  Studies/Results: Dg Esophagus  07/25/2014   CLINICAL DATA:  Dysphagia with foreign body sensation in throat after eating [place] toast this morning. Question food impaction. Initial encounter.  EXAM: ESOPHOGRAM/BARIUM SWALLOW  TECHNIQUE: Single contrast examination was performed using  thin barium.  FLUOROSCOPY TIME:  Examination was performed using low dose fluoroscopy. Images were recorded with fluoro-store. Number of separate spot images:None.  Dose area product: 288.65 uGy*m2.  COMPARISON:  Chest radiographs 07/17/2014.  FINDINGS: Study is limited by the patient's limited mobility. Examination was performed in the supine semi-erect and right anterior oblique prone positions.  Initial swallowing in the AP semi-erect position demonstrates moderate presbyesophagus. There is no evidence of impacted object within the esophagus. No mucosal lesion or fixed stricture identified.  Additional imaging was performed in the right anterior oblique prone position. The esophageal motility is similar. No mucosal lesion, stricture or impacted foreign body demonstrated. Contrast enters the stomach. A barium tablet was not administered.  IMPRESSION: 1. No evidence of impacted food within the esophagus. 2. No evidence of mucosal lesion or stricture. 3. Moderate presbyesophagus.   Electronically Signed   By: Richardean Sale M.D.   On: 07/25/2014 18:35   Dg Chest Port 1 View  07/26/2014   CLINICAL DATA:  Shortness of breath  EXAM: PORTABLE CHEST - 1 VIEW  COMPARISON:  07/17/2014  FINDINGS: Cardiac shadow is stable. Increasing bibasilar infiltrates and effusions are seen. Increased vascular congestion is noted with interstitial edema.  IMPRESSION: Changes consistent with CHF and increasing effusions. Increasing bibasilar infiltrates are noted particularly the left retrocardiac region.   Electronically Signed   By: [name] M.D.   On: 07/26/2014 10:44      Assessment & Plan  *Dysphagia - improved following esophageal dilitation.  Needs no further GI studies.  Recommend calorie counts.** Principal Problem:   Acute renal failure Active Problems:   CKD (chronic kidney disease), stage III   Hypertension   Hypothyroidism   Renal artery stenosis   CHF (congestive heart failure)   Acute renal failure superimposed on stage 3 chronic kidney disease   Acute on chronic diastolic congestive heart failure   Dysphagia, pharyngoesophageal phase   Anxiety state    Palliative care encounter   CN (constipation)   Nausea without vomiting   Schatzki's ring  Signing off   LOS: 10 days   Erskine Emery  07/27/2014, 12:54 PM

## 2014-07-28 LAB — BASIC METABOLIC PANEL
ANION GAP: 10 (ref 5–15)
BUN: 70 mg/dL — ABNORMAL HIGH (ref 6–23)
CO2: 36 mmol/L — AB (ref 19–32)
CREATININE: 2.14 mg/dL — AB (ref 0.50–1.10)
Calcium: 9.3 mg/dL (ref 8.4–10.5)
Chloride: 97 mmol/L (ref 96–112)
GFR calc Af Amer: 23 mL/min — ABNORMAL LOW (ref >90)
GFR calc non Af Amer: 19 mL/min — ABNORMAL LOW (ref >90)
Glucose, Bld: 147 mg/dL — ABNORMAL HIGH (ref 70–99)
POTASSIUM: 3 mmol/L — AB (ref 3.5–5.1)
Sodium: 143 mmol/L (ref 135–145)

## 2014-07-28 MED ORDER — POLYETHYLENE GLYCOL 3350 17 G PO PACK
17.0000 g | PACK | Freq: Two times a day (BID) | ORAL | Status: DC
  Filled 2014-07-28 (×2): qty 1

## 2014-07-28 MED ORDER — PROMETHAZINE HCL 25 MG/ML IJ SOLN
12.5000 mg | INTRAMUSCULAR | Status: DC | PRN
  Filled 2014-07-28: qty 1

## 2014-07-28 MED ORDER — MORPHINE SULFATE 2 MG/ML IJ SOLN
1.0000 mg | INTRAMUSCULAR | Status: DC | PRN
  Administered 2014-07-28: 1 mg via INTRAVENOUS
  Filled 2014-07-28: qty 1

## 2014-07-28 MED ORDER — LORAZEPAM 2 MG/ML IJ SOLN: 1.0000 mg | INTRAMUSCULAR | Status: DC | PRN

## 2014-07-28 NOTE — Progress Notes (Signed)
Patient referred to CSW to assist patient/family with d/c options for Hospice Care. CSW met with patient's daughter Boneta Lucks- [phone] and patient's Friend/POA Education officer, museum)- [name] to discuss options for care.  Patient's condition has drastically changed over the weekend and she is now a DNR. Family reports that she has not eaten in many days.  MD reports anticipated life expectancy to be 2 weeks or less per report.  Daughter is willing to bring her home with hospice care if unable to place patient at [facility].  She does not wish to consider any other hospice facility. CSW contacted [place] for [facility] and referral was completed.  Awaiting decision re: possible acceptance.  Patient's daughter wept in relief when told of referral.   Daughter feels that her mother will accept Hospice care but is concerned that she has not actually spoken to her directly about it. Daughter would like MD or other professional to discuss with her.  Discussed with Dr. Waldron Labs and he will get in contact with Palliative Care services and request another visit.  Dr. Lenward Chancellor has been following patient with last visit on 07/26/14.  Should there be a bed at [facility] tomorrow- ok for d/c per Dr. Waldron Labs. CSW will follow up with BP Liason and family tomorrow morning.  SW Psychosocial Assessment to follow.  Lorie Phenix. Pauline Good, [place]

## 2014-07-28 NOTE — Significant Event (Signed)
SS enema performed patient tolerated procedure well ,clear slightly brown tinged liquid returned.

## 2014-07-28 NOTE — Progress Notes (Signed)
Patient Demographics  Rachael [name], is a 81 y.o. female, DOB - Dec 09, 1923, AST:[id number]  Admit date - 07/17/2014   Admitting Physician No admitting provider for patient encounter.  Outpatient Primary MD for the patient is Rachael Richards, MD  LOS - 11   Chief Complaint  Patient presents with  . Shortness of Breath      Brief summary  81 year old Caucasian female with history of diastolic diastolic dysfunction, chronic kidney disease stage V, early controlled hypertension, who was admitted to the hospital for CHF, diuresis has been in challenge due to poor renal function, cardiology renal both following S. She is still short of breath has edema and requiring oxygen. Patient was seen by palliative care, giving her worsening renal function, and she is not hemodialysis candidate, there was question of renal artery stenosis, but she is not a candidate for intervention, her hospital stay was complicated by constipation, which improved with good bowel regimen, as well she had an episode of dysphagia, she is known to have Schatzki ring and endoscopy in May 2015. patient had endoscopy on 3/4, demonstrated a nonobstructing Schatzki's ring. This was dilated to 18 mm. Patient continues to have overall deterioration of functional status, has extremely poor oral intake, discussed with daughter at bedside, and health care power of attorney Rachael [name], regarding comfort care measures, given patient overall poor prognosis, and life quality, and decision has been made to pursue comfort care measures only, with no active measures including no blood work, or studies, and plan is to discharge with hospice.    Subjective:   Rachael [name] today has,appears more lethargic, continues to have nausea, complaints of mild  abdominal pain . Assessment & Plan     Acute respiratory failure,  - fluid overload due to acute on chronic diastolic CHF EF 98% on recent echo.  - initially  on IV Lasix , transitioned to oral, adjusted by nephrology, continue beta blocker.  Continue supportive care and monitor. Try to titrate off O2. Diuresis has been a challenge due to poor baseline renal function. Cardiology and renal both following.  Filed Weights   07/26/14 0501 07/27/14 0507 07/28/14 0600  Weight: 50.2 kg (110 lb 10.7 oz) 49 kg (108 lb 0.4 oz) 48.7 kg (107 lb 5.8 oz)     acute on chronic diastolic CHF EF 92% on recent echo - on  diuresis, diuresis is limited due to renal failure, still with evidence of volume overload. - Patient is comfort care only, will continue with oral diuresis as a comfort care measure to prevent dyspnea .   Acute on chronic renal failure and  kidney disease stage IV. Baseline creatinine around 2, diurese and monitor. Likely due to CHF decompensation. Avoid nephrotoxins. Renal following. - Possible renal artery stenosis, high risk for intervention . - Not a candidate for hemodialysis, palliative care consult appreciated. - No further labs .  Dysphagia - Gastroenterology input appreciated. - endoscopy on 3/4, demonstrated a nonobstructing Schatzki's ring. This was dilated to 18 mm. - on soft diet.  Essential hypertension.     Hyperglycemia in the ER. Likely due to stress. Recent A1c was 5.6.    Constipation with abdominal pain. Abdominal x-ray shows no obstruction, and laxatives as needed .   Hypokalemia. -  No further labs    Hypothyroidism.    Dyslipidemia.      Code Status: DNR. D/W patient and daughter at bedside, and health care power of attorney Rachael [name] over the phone , patient has been made comfort care. Family Communication:Daughter at bedside  Disposition Plan: Social worker to evaluate.  Procedures  endoscopy on 3/4, demonstrated a nonobstructing Schatzki's  ring. This was dilated to 18 mm.   [place]   Cardiology. nephrology Palliative Gastroenterology  Medications  Scheduled Meds: . bisacodyl  10 mg Rectal Daily  . bisoprolol  10 mg Oral Daily  . cloNIDine  0.1 mg Oral TID  . feeding supplement (NEPRO CARB STEADY)  237 mL Oral TID WC  . furosemide  80 mg Oral TID  . lactose free nutrition  237 mL Oral TID BM  . levothyroxine  175 mcg Oral QAC breakfast  . polyvinyl alcohol  1 drop Both Eyes TID  . senna-docusate  2 tablet Oral BID  . sodium chloride  1 drop Both Eyes QHS  . sodium chloride  3 mL Intravenous Q12H   Continuous Infusions:   PRN Meds:.acetaminophen **OR** acetaminophen, albuterol, hydrALAZINE, LORazepam, morphine injection, ondansetron **OR** ondansetron (ZOFRAN) IV, prochlorperazine, promethazine, sodium chloride  DVT Prophylaxis   Comfort care, no DVT prox.  Lab Results  Component Value Date   PLT 416* 07/27/2014    Antibiotics    Anti-infectives    None          Objective:   Filed Vitals:   07/27/14 0507 07/27/14 1358 07/27/14 2115 07/28/14 0600  BP: 166/53 179/45 162/41 191/50  Pulse: 70 58 60 62  Temp: 98.2 F (36.8 C) 97.9 F (36.6 C) 97.3 F (36.3 C) 97.3 F (36.3 C)  TempSrc: Oral Oral Oral Oral  Resp: 20 20 18 18   Height:      Weight: 49 kg (108 lb 0.4 oz)   48.7 kg (107 lb 5.8 oz)  SpO2: 99% 93% 97% 97%    Wt Readings from Last 3 Encounters:  07/28/14 48.7 kg (107 lb 5.8 oz)  07/15/14 55.611 kg (122 lb 9.6 oz)  07/11/14 55.883 kg (123 lb 3.2 oz)     Intake/Output Summary (Last 24 hours) at 07/28/14 1119 Last data filed at 07/28/14 0955  Gross per 24 hour  Intake   1527 ml  Output   2050 ml  Net   -523 ml     Physical Exam  Awake , more lethargic,  frail, elderly female ,No new F.N deficits, Normal affect .AT,PERRAL Supple Neck,No JVD, No cervical lymphadenopathy appriciated.  Symmetrical Chest wall movemen fair  air movement bilaterally, rales RRR,No  Gallops,Rubs or new Murmurs, No Parasternal Heave +ve B.Sounds, Abd Soft, No tenderness, No organomegaly appriciated, No rebound - guarding or rigidity. No Cyanosis, Clubbing , trace edema, No new Rash or bruise     Data Review   Micro Results No results found for this or any previous visit (from the past 240 hour(s)).  Radiology Reports Dg Chest 2 View  07/17/2014   CLINICAL DATA:  Worsening shortness of breath over 2 days  EXAM: CHEST  2 VIEW  COMPARISON:  11/29/2013  FINDINGS: Cardiac shadow is within normal limits. Mild vascular congestion is seen. Small bilateral pleural effusions are noted but improved from the prior exam. Bibasilar scarring is seen. Bilateral nipple shadows are noted.  IMPRESSION: Small bilateral pleural effusions.  Mild vascular congestion.   Electronically Signed   By: [name] M.D.   On:  07/17/2014 13:49   Dg Esophagus  07/25/2014   CLINICAL DATA:  Dysphagia with foreign body sensation in throat after eating [place] toast this morning. Question food impaction. Initial encounter.  EXAM: ESOPHOGRAM/BARIUM SWALLOW  TECHNIQUE: Single contrast examination was performed using  thin barium.  FLUOROSCOPY TIME:  Examination was performed using low dose fluoroscopy. Images were recorded with fluoro-store. Number of separate spot images:None. Dose area product: 288.65 uGy*m2.  COMPARISON:  Chest radiographs 07/17/2014.  FINDINGS: Study is limited by the patient's limited mobility. Examination was performed in the supine semi-erect and right anterior oblique prone positions.  Initial swallowing in the AP semi-erect position demonstrates moderate presbyesophagus. There is no evidence of impacted object within the esophagus. No mucosal lesion or fixed stricture identified.  Additional imaging was performed in the right anterior oblique prone position. The esophageal motility is similar. No mucosal lesion, stricture or impacted foreign body demonstrated. Contrast enters the stomach. A  barium tablet was not administered.  IMPRESSION: 1. No evidence of impacted food within the esophagus. 2. No evidence of mucosal lesion or stricture. 3. Moderate presbyesophagus.   Electronically Signed   By: Richardean Sale M.D.   On: 07/25/2014 18:35   Dg Chest Port 1 View  07/26/2014   CLINICAL DATA:  Shortness of breath  EXAM: PORTABLE CHEST - 1 VIEW  COMPARISON:  07/17/2014  FINDINGS: Cardiac shadow is stable. Increasing bibasilar infiltrates and effusions are seen. Increased vascular congestion is noted with interstitial edema.  IMPRESSION: Changes consistent with CHF and increasing effusions. Increasing bibasilar infiltrates are noted particularly the left retrocardiac region.   Electronically Signed   By: [name] M.D.   On: 07/26/2014 10:44   Dg Abd Portable 1v  07/23/2014   CLINICAL DATA:  Abdominal pain since last night.  EXAM: PORTABLE ABDOMEN - 1 VIEW  COMPARISON:  CT of 03/28/2007  FINDINGS: 2 supine views. Dense thoracoabdominal aortic atherosclerosis. No bowel distention. Moderate amount of descending colonic and rectosigmoid stool. Equivocal calcifications projecting over the left side of the abdomen. Favored to be vascular. Left hip arthroplasty and right proximal femoral fixation.  Small bilateral pleural effusions with adjacent airspace disease.  IMPRESSION: No bowel obstruction or free intraperitoneal air. Possible constipation.  Probable vascular calcifications projecting over the left side of the abdomen. Cannot exclude left sided renal calculi. Of note, no stones are identified on the remote CT.  Small bilateral pleural effusions with adjacent airspace disease. Suboptimally evaluated.   Electronically Signed   By: Abigail Miyamoto M.D.   On: 07/23/2014 09:12     CBC  Recent Labs Lab 07/25/14 0500 07/25/14 1412 07/27/14 0339  WBC 11.8*  --  11.2*  HGB 9.7* 11.6* 11.4*  HCT 29.8* 34.0* 35.3*  PLT 363  --  416*  MCV 89.8  --  89.6  MCH 29.2  --  28.9  MCHC 32.6  --  32.3   RDW 14.6  --  14.5    Chemistries   Recent Labs Lab 07/24/14 0520 07/25/14 0500 07/25/14 1412 07/26/14 0441 07/27/14 0339 07/28/14 0811  NA 139 138 137 141 144 143  K 4.1 2.8* 2.6* 4.0 2.8* 3.0*  CL 101 96  --  97 95* 97  CO2 24 31  --  32 35* 36*  GLUCOSE 105* 117* 141* 113* 108* 147*  BUN 90* 93*  --  83* 78* 70*  CREATININE 2.91* 2.73*  --  2.51* 2.18* 2.14*  CALCIUM 9.1 8.9  --  9.0 9.1 9.3   ------------------------------------------------------------------------------------------------------------------  estimated creatinine clearance is 14 mL/min (by C-G formula based on Cr of 2.14). ------------------------------------------------------------------------------------------------------------------ No results for input(s): HGBA1C in the last 72 hours. ------------------------------------------------------------------------------------------------------------------ No results for input(s): CHOL, HDL, LDLCALC, TRIG, CHOLHDL, LDLDIRECT in the last 72 hours. ------------------------------------------------------------------------------------------------------------------ No results for input(s): TSH, T4TOTAL, T3FREE, THYROIDAB in the last 72 hours.  Invalid input(s): FREET3 ------------------------------------------------------------------------------------------------------------------ No results for input(s): VITAMINB12, FOLATE, FERRITIN, TIBC, IRON, RETICCTPCT in the last 72 hours.  Coagulation profile No results for input(s): INR, PROTIME in the last 168 hours.  No results for input(s): DDIMER in the last 72 hours.  Cardiac Enzymes No results for input(s): CKMB, TROPONINI, MYOGLOBIN in the last 168 hours.  Invalid input(s): CK ------------------------------------------------------------------------------------------------------------------ Invalid input(s): POCBNP     Time Spent in minutes   25 minutes.   Waldron Labs, DAWOOD M.D on 07/28/2014 at 11:19  AM  Between 7am to 7pm - Pager - [phone]  After 7pm go to [email or web address] - password [facility]  [facility]   Office  [phone]

## 2014-07-29 ENCOUNTER — Encounter (HOSPITAL_COMMUNITY): Payer: Self-pay | Admitting: Gastroenterology

## 2014-07-29 MED ORDER — POTASSIUM CHLORIDE CRYS ER 20 MEQ PO TBCR
40.0000 meq | EXTENDED_RELEASE_TABLET | Freq: Once | ORAL | Status: AC
  Administered 2014-07-29: 40 meq via ORAL
  Filled 2014-07-29: qty 2

## 2014-07-29 MED ORDER — ONDANSETRON HCL 4 MG/2ML IJ SOLN: 4.0000 mg | Freq: Four times a day (QID) | INTRAMUSCULAR | Status: DC | PRN

## 2014-07-29 MED ORDER — METOCLOPRAMIDE HCL 5 MG/ML IJ SOLN
5.0000 mg | Freq: Four times a day (QID) | INTRAMUSCULAR | Status: DC
  Administered 2014-07-29: 5 mg via INTRAVENOUS
  Filled 2014-07-29 (×4): qty 1

## 2014-07-29 MED ORDER — FUROSEMIDE 80 MG PO TABS
80.0000 mg | ORAL_TABLET | Freq: Two times a day (BID) | ORAL | Status: DC
  Administered 2014-07-29: 80 mg via ORAL
  Filled 2014-07-29 (×3): qty 1

## 2014-07-29 MED ORDER — SENNOSIDES-DOCUSATE SODIUM 8.6-50 MG PO TABS: 2.0000 | ORAL_TABLET | Freq: Two times a day (BID) | ORAL | Status: AC

## 2014-07-29 MED ORDER — PROMETHAZINE HCL 25 MG/ML IJ SOLN: 25.0000 mg | INTRAMUSCULAR | Status: DC | PRN

## 2014-07-29 MED ORDER — PROCHLORPERAZINE EDISYLATE 5 MG/ML IJ SOLN
5.0000 mg | INTRAMUSCULAR | Status: DC | PRN
Start: 2014-07-29 — End: 2014-10-01

## 2014-07-29 MED ORDER — ALBUTEROL SULFATE (2.5 MG/3ML) 0.083% IN NEBU: 2.5000 mg | INHALATION_SOLUTION | Freq: Four times a day (QID) | RESPIRATORY_TRACT | Status: DC | PRN

## 2014-07-29 MED ORDER — METOCLOPRAMIDE HCL 5 MG/ML IJ SOLN: 5.0000 mg | Freq: Four times a day (QID) | INTRAMUSCULAR | Status: DC

## 2014-07-29 MED ORDER — LORAZEPAM 2 MG/ML IJ SOLN: 1.0000 mg | INTRAMUSCULAR | Status: DC | PRN

## 2014-07-29 MED ORDER — FUROSEMIDE 40 MG PO TABS: 80.0000 mg | ORAL_TABLET | Freq: Two times a day (BID) | ORAL | Status: DC

## 2014-07-29 MED ORDER — BISACODYL 10 MG RE SUPP: 10.0000 mg | Freq: Every day | RECTAL | Status: DC

## 2014-07-29 MED ORDER — MORPHINE SULFATE 2 MG/ML IJ SOLN: 1.0000 mg | INTRAMUSCULAR | Status: DC | PRN

## 2014-07-29 MED ORDER — POLYVINYL ALCOHOL 1.4 % OP SOLN: 1.0000 [drp] | Freq: Three times a day (TID) | OPHTHALMIC | Status: AC

## 2014-07-29 MED ORDER — CLONIDINE HCL 0.1 MG/24HR TD PTWK: 0.1000 mg | MEDICATED_PATCH | TRANSDERMAL | Status: DC

## 2014-07-29 MED ORDER — CLONIDINE HCL 0.1 MG/24HR TD PTWK
0.1000 mg | MEDICATED_PATCH | TRANSDERMAL | Status: DC
  Administered 2014-07-29: 0.1 mg via TRANSDERMAL
  Filled 2014-07-29: qty 1

## 2014-07-29 MED ORDER — SALINE SPRAY 0.65 % NA SOLN
1.0000 | NASAL | Status: DC | PRN
Start: 2014-07-29 — End: 2014-10-01

## 2014-07-29 NOTE — Progress Notes (Signed)
Patient ID: Rachael [name], female   DOB: 10-16-1923, 81 y.o.   MRN: [id number]  [facility] Progress Note    Assessment/ Plan:   1. Dysphagia with N/V/anorexia and FTT- continues to have dysphagia, plans noted for transition to hospice at this time. 2. AKI/CKD- in setting of decompensated CHF +/- R RAS.Further investigations suspended due to her advanced decompensation and plans in place to transition to hospice care 3. Acute on chronic diastolic CHF- fair urine output, improving volume status and stable from a CHF standpoint at this time 4. Renal artery stenosis-possibly bilateral, too frail to tolerate percutaneous intervention at this time 5. Anemia of chronic disease- discontinue further investigations-transitioning to hospice  6. HTN- stable 7. Anorexia/FTT- awaits admission to hospice with her advanced failure to thrive and multiple comorbidities  We'll sign off at this time-please reconsult if we can offer further help in her management  Subjective:   Reports to be feeling fair-still with occasional dysphagia. Denies any chest pain or shortness of breath    Objective:   BP 210/73 mmHg  Pulse 78  Temp(Src) 97.8 F (36.6 C) (Oral)  Resp 18  Ht 5' 8.75" (1.746 m)  Wt 47.809 kg (105 lb 6.4 oz)  BMI 15.68 kg/m2  SpO2 100%  Intake/Output Summary (Last 24 hours) at 07/29/14 1042 Last data filed at 07/29/14 1029  Gross per 24 hour  Intake   2212 ml  Output   1351 ml  Net    861 ml   Weight change: -0.891 kg (-1 lb 15.4 oz)  Physical Exam: Gen: Comfortably resting in bed CVS: Pulse regular in rate and rhythm, S1 and S2 normal Resp: Transmitted breath sounds-no distinct rales/rhonchi Abd: Firm, nontender, bowel sounds normal Ext: No lower extremity edema  Imaging: No results found.  Labs: BMET  Recent Labs Lab 07/23/14 0425 07/24/14 0520 07/25/14 0500 07/25/14 1412 07/26/14 0441 07/27/14 0339 07/28/14 0811  NA 136 139 138 137 141 144 143   K 3.2* 4.1 2.8* 2.6* 4.0 2.8* 3.0*  CL 96 101 96  --  97 95* 97  CO2 25 24 31   --  32 35* 36*  GLUCOSE 187* 105* 117* 141* 113* 108* 147*  BUN 88* 90* 93*  --  83* 78* 70*  CREATININE 2.99* 2.91* 2.73*  --  2.51* 2.18* 2.14*  CALCIUM 9.3 9.1 8.9  --  9.0 9.1 9.3  PHOS  --   --   --   --   --  4.3  --    CBC  Recent Labs Lab 07/25/14 0500 07/25/14 1412 07/27/14 0339  WBC 11.8*  --  11.2*  HGB 9.7* 11.6* 11.4*  HCT 29.8* 34.0* 35.3*  MCV 89.8  --  89.6  PLT 363  --  416*    Medications:    . bisacodyl  10 mg Rectal Daily  . feeding supplement (NEPRO CARB STEADY)  237 mL Oral TID WC  . furosemide  80 mg Oral BID  . lactose free nutrition  237 mL Oral TID BM  . levothyroxine  175 mcg Oral QAC breakfast  . metoCLOPramide (REGLAN) injection  5 mg Intravenous 4 times per day  . polyvinyl alcohol  1 drop Both Eyes TID  . senna-docusate  2 tablet Oral BID  . sodium chloride  1 drop Both Eyes QHS  . sodium chloride  3 mL Intravenous Q12H   Elmarie Shiley, MD 07/29/2014, 10:42 AM

## 2014-07-29 NOTE — Progress Notes (Signed)
Patient ID: Rachael [name], female   DOB: 1924/05/22, 81 y.o.   MRN: [id number] P  SUBJECTIVE:  Reasonable UOP.  Patient has low grade nausea without vomiting.  Reviewing notes, it appears that she has been made comfort measures only.  She is off BP meds and BP is running high.  She does not seem completely aware of her changed status.    Scheduled Meds: . bisacodyl  10 mg Rectal Daily  . feeding supplement (NEPRO CARB STEADY)  237 mL Oral TID WC  . furosemide  80 mg Oral BID  . lactose free nutrition  237 mL Oral TID BM  . levothyroxine  175 mcg Oral QAC breakfast  . polyvinyl alcohol  1 drop Both Eyes TID  . potassium chloride  40 mEq Oral Once  . potassium chloride  40 mEq Oral Once  . senna-docusate  2 tablet Oral BID  . sodium chloride  1 drop Both Eyes QHS  . sodium chloride  3 mL Intravenous Q12H   Continuous Infusions:   PRN Meds:.acetaminophen **OR** acetaminophen, albuterol, hydrALAZINE, LORazepam, morphine injection, ondansetron **OR** ondansetron (ZOFRAN) IV, prochlorperazine, promethazine, sodium chloride    Filed Vitals:   07/28/14 0600 07/28/14 1442 07/28/14 2026 07/29/14 0556  BP: 191/50 163/112 165/75 192/73  Pulse: 62 70 75 78  Temp: 97.3 F (36.3 C) 97.7 F (36.5 C) 97.9 F (36.6 C) 97.8 F (36.6 C)  TempSrc: Oral Oral Oral Oral  Resp: 18 20 19 18   Height:      Weight: 107 lb 5.8 oz (48.7 kg)   105 lb 6.4 oz (47.809 kg)  SpO2: 97% 96% 97% 100%    Intake/Output Summary (Last 24 hours) at 07/29/14 0748 Last data filed at 07/29/14 0700  Gross per 24 hour  Intake   1852 ml  Output   1801 ml  Net     51 ml    LABS: Basic Metabolic Panel:  Recent Labs  07/27/14 0339 07/28/14 0811  NA 144 143  K 2.8* 3.0*  CL 95* 97  CO2 35* 36*  GLUCOSE 108* 147*  BUN 78* 70*  CREATININE 2.18* 2.14*  CALCIUM 9.1 9.3  PHOS 4.3  --    Liver Function Tests:  Recent Labs  07/27/14 0339  ALBUMIN 3.4*   No results for input(s): LIPASE, AMYLASE in the  last 72 hours. CBC:  Recent Labs  07/27/14 0339  WBC 11.2*  HGB 11.4*  HCT 35.3*  MCV 89.6  PLT 416*   Cardiac Enzymes: No results for input(s): CKTOTAL, CKMB, CKMBINDEX, TROPONINI in the last 72 hours. BNP: Invalid input(s): POCBNP D-Dimer: No results for input(s): DDIMER in the last 72 hours. Hemoglobin A1C: No results for input(s): HGBA1C in the last 72 hours. Fasting Lipid Panel: No results for input(s): CHOL, HDL, LDLCALC, TRIG, CHOLHDL, LDLDIRECT in the last 72 hours. Thyroid Function Tests: No results for input(s): TSH, T4TOTAL, T3FREE, THYROIDAB in the last 72 hours.  Invalid input(s): FREET3 Anemia Panel: No results for input(s): VITAMINB12, FOLATE, FERRITIN, TIBC, IRON, RETICCTPCT in the last 72 hours.  RADIOLOGY: Dg Chest 2 View  07/17/2014   CLINICAL DATA:  Worsening shortness of breath over 2 days  EXAM: CHEST  2 VIEW  COMPARISON:  11/29/2013  FINDINGS: Cardiac shadow is within normal limits. Mild vascular congestion is seen. Small bilateral pleural effusions are noted but improved from the prior exam. Bibasilar scarring is seen. Bilateral nipple shadows are noted.  IMPRESSION: Small bilateral pleural effusions.  Mild vascular congestion.  Electronically Signed   By: [name] M.D.   On: 07/17/2014 13:49   Dg Esophagus  07/25/2014   CLINICAL DATA:  Dysphagia with foreign body sensation in throat after eating [place] toast this morning. Question food impaction. Initial encounter.  EXAM: ESOPHOGRAM/BARIUM SWALLOW  TECHNIQUE: Single contrast examination was performed using  thin barium.  FLUOROSCOPY TIME:  Examination was performed using low dose fluoroscopy. Images were recorded with fluoro-store. Number of separate spot images:None. Dose area product: 288.65 uGy*m2.  COMPARISON:  Chest radiographs 07/17/2014.  FINDINGS: Study is limited by the patient's limited mobility. Examination was performed in the supine semi-erect and right anterior oblique prone positions.   Initial swallowing in the AP semi-erect position demonstrates moderate presbyesophagus. There is no evidence of impacted object within the esophagus. No mucosal lesion or fixed stricture identified.  Additional imaging was performed in the right anterior oblique prone position. The esophageal motility is similar. No mucosal lesion, stricture or impacted foreign body demonstrated. Contrast enters the stomach. A barium tablet was not administered.  IMPRESSION: 1. No evidence of impacted food within the esophagus. 2. No evidence of mucosal lesion or stricture. 3. Moderate presbyesophagus.   Electronically Signed   By: Richardean Sale M.D.   On: 07/25/2014 18:35   Dg Chest Port 1 View  07/26/2014   CLINICAL DATA:  Shortness of breath  EXAM: PORTABLE CHEST - 1 VIEW  COMPARISON:  07/17/2014  FINDINGS: Cardiac shadow is stable. Increasing bibasilar infiltrates and effusions are seen. Increased vascular congestion is noted with interstitial edema.  IMPRESSION: Changes consistent with CHF and increasing effusions. Increasing bibasilar infiltrates are noted particularly the left retrocardiac region.   Electronically Signed   By: [name] M.D.   On: 07/26/2014 10:44   Dg Abd Portable 1v  07/23/2014   CLINICAL DATA:  Abdominal pain since last night.  EXAM: PORTABLE ABDOMEN - 1 VIEW  COMPARISON:  CT of 03/28/2007  FINDINGS: 2 supine views. Dense thoracoabdominal aortic atherosclerosis. No bowel distention. Moderate amount of descending colonic and rectosigmoid stool. Equivocal calcifications projecting over the left side of the abdomen. Favored to be vascular. Left hip arthroplasty and right proximal femoral fixation.  Small bilateral pleural effusions with adjacent airspace disease.  IMPRESSION: No bowel obstruction or free intraperitoneal air. Possible constipation.  Probable vascular calcifications projecting over the left side of the abdomen. Cannot exclude left sided renal calculi. Of note, no stones are identified  on the remote CT.  Small bilateral pleural effusions with adjacent airspace disease. Suboptimally evaluated.   Electronically Signed   By: Abigail Miyamoto M.D.   On: 07/23/2014 09:12    PHYSICAL EXAM General: NAD Neck: JVP 8 cm, no thyromegaly or thyroid nodule.  Lungs: Clear to auscultation bilaterally with normal respiratory effort. CV: Nondisplaced PMI.  Heart regular S1/S2, no S3/S4, 2/6 SEM RUSB.  Trace edema at ankles bilaterally.  Abdomen: Soft, nontender, no hepatosplenomegaly, no distention.  Neurologic: Alert and oriented x 3.  Psych: Normal affect. Extremities: No clubbing or cyanosis.  Skin: Steri strips upper back.   TELEMETRY: Reviewed telemetry pt in NSR  ASSESSMENT AND PLAN: 81 yo with history of CKD, renovascular HTN, PAD including bilateral renal artery stenosis, and chronic diastolic CHF was admitted with dyspnea and AKI.  1. AKI on CKD: Difficult situation. Had been seeing PCP frequently to work on lowering her BP, which had been quite high and uncontrolled. I suspect she has renovascular hypertension with severe right renal artery stenosis and borderline significant left  renal artery stenosis. She was admitted with rise in creatinine from 1.7 => 3.Concern that  BP lowering in the face of possible bilateral significant renal artery stenosis worsened renal function acutely. She was also volume overloaded (got IV fluid initially) with creatinine still elevated.  Discussed situation with Drs Marval Regal and Fletcher Anon.  Only reason to intervene on renal artery stenosis would be if we had hemodynamically significant bilateral renal artery stenosis that is causing renal dysfunction.  However, we only know that the artery to one side has definite hemodynamically significant stenosis.  Given high risk of intervention, would hold off.  UOP and creatinine improving.  2. HTN: Poor control historically with possible renovascular HTN. See discussion above about concern that aggressive BP  control will worsen GFR with possible bilateral RAS. Would aim to let SBP run around 150. She is off her BP meds currently as plans have been made to make her comfort measures only.  3. Acute on chronic diastolic CHF: Slow improvement.  Weight down, diuresed reasonably yesterday.  Creatinine coming down.  Still on oxygen with some pulmonary congestion on CXR.  Can transition Lasix to bid.  4. Dysphagia: Barium swallow relatively unremarkable, mild Schatzski's ring on EGD, had dilation.  5. Disposition: She has been made comfort measures only by my review of notes.  She is not fully aware of this.  Palliative care service will need to have this discussion today with patient. Plan for home with hospice it appears.   I will sign off, call with questions.   Rachael [name] 07/29/2014 7:48 AM

## 2014-07-29 NOTE — Progress Notes (Addendum)
Patient MK:[id number] SANTITA HUNSBERGER      DOB: [birth year]      AVW:[id number]   Palliative Medicine Team at [facility] Progress Note    Subjective: Continues to feel nauseated. Feels down.  Tired of being in hospital. Frustrated she can't go home without 24h care. No pain. Swallowing improved. Barely eating.    Filed Vitals:   07/29/14 0556  BP: 192/73  Pulse: 78  Temp: 97.8 F (36.6 C)  Resp: 18   Physical exam: GEN: alert, NAD HEENT: [place], sclera anicteric CV: RRR LUNGS: CTAB ABD: soft, ND EXT: warm  CBC    Component Value Date/Time   WBC 11.2* 07/27/2014 0339   RBC 3.94 07/27/2014 0339   HGB 11.4* 07/27/2014 0339   HCT 35.3* 07/27/2014 0339   PLT 416* 07/27/2014 0339   MCV 89.6 07/27/2014 0339   MCH 28.9 07/27/2014 0339   MCHC 32.3 07/27/2014 0339   RDW 14.5 07/27/2014 0339   LYMPHSABS 0.5* 07/17/2014 1235   MONOABS 0.9 07/17/2014 1235   EOSABS 0.0 07/17/2014 1235   BASOSABS 0.0 07/17/2014 1235    CMP     Component Value Date/Time   NA 143 07/28/2014 0811   K 3.0* 07/28/2014 0811   CL 97 07/28/2014 0811   CO2 36* 07/28/2014 0811   GLUCOSE 147* 07/28/2014 0811   BUN 70* 07/28/2014 0811   CREATININE 2.14* 07/28/2014 0811   CREATININE 2.01* 07/04/2014 1217   CALCIUM 9.3 07/28/2014 0811   PROT 6.5 07/04/2014 1217   ALBUMIN 3.4* 07/27/2014 0339   AST 23 07/04/2014 1217   ALT 17 07/04/2014 1217   ALKPHOS 75 07/04/2014 1217   BILITOT 0.6 07/04/2014 1217   GFRNONAA 19* 07/28/2014 0811   GFRNONAA 22* 07/04/2014 1217   GFRAA 23* 07/28/2014 0811   GFRAA 25* 07/04/2014 1217    Assessment and plan: 81 yo female with HTN, CKD, HFpEF, RAS admitted with acute resp failure, AKI, CHF exacerbation.   1. Code Status: DNR  2. GOC: Over weekend she was made comfort care. Primary team exploring residential hospice facility and they are to evaluate for eligibility today. Her PO intake is minimal at this point, and fucntionally doing very poorly despite some improvements  in renal function. I think she remains high risk for sudden and acute decline. Her step-daughter and friend brenda were both at bedside.  [place] frustrated that her health has not improved and she cant go home by herself.  We talked about plan for hospice facility which she is in agreement with. Still upset home is not good option though.  Does not want to discuss her situation today but understands plan is for comfort directed care.  Family states that she has been discussing with them that she is not doing well and stopped talking about going home few days ago.  They are in agreement that comfort care is best option going forward.   3. Symptom Management:  1. Anxiety/Agitation: ativan PRN 2. Nausea- given her motility issues (may not be isolated to esophagus) will try some reglan.  Can continue compazine PRN she has not noted help from zofran.   4. Psychosocial/Spiritual: Widowed. No children. Main support is her niece. Reports living independently prior to admission. Had cleaning service and family help with meals.   Total Time: 25 minutes >50% of time spent in counseling and coordination of care regarding above  Doran Clay D.O. Palliative Medicine Team at [facility]  Pager: [phone] Team Phone: [phone]

## 2014-07-29 NOTE — Progress Notes (Signed)
OT Cancellation Note and Discharge  Patient Details Name: Rachael [name] MRN: [id number] DOB: 12-05-1923   Cancelled Treatment:    Reason Eval/Treat Not Completed: Other (comment). Per chart pt is not comfort care only with either to [facility] or home with hospice. We will sign off.  Almon Register [phone] 07/29/2014, 7:47 AM

## 2014-07-29 NOTE — Discharge Instructions (Signed)
Patient will be managed by hospice home physician.   Disposition hospice home   Diet: Soft diet , with feeding assistance and aspiration precautions as needed.

## 2014-07-29 NOTE — Progress Notes (Signed)
CSW met with patient, her step-daughter Arbie Cookey and HCPOA/Friend [name] this morning to discuss residential hospice option. Patient is agreeable to this choice and requests placement at [facility].  Patient was evaluated and approved for admission to [facility] per Erling Conte, LCSW [facility].  EMS transport arranged and nursing notified to call report.  Patient was noted to have a somewhat flat affect; asking appropriate and thorough questions about [employer] and the services involved.  She stated that she was glad to be able to talk about the services and agreeable to d/c today. Step-daughter was, at times, tearful but very relieved and happy that her mother chose residential hospice. She feels that patient has been fully aware of her deterioration and that she had been wanting to talk about her options but never brought it up and she did not feel comfortable addressing issues with her.  Nursing called report to [facility]; support provided to patient and her daughter.  Lorie Phenix. Pauline Good, [place]

## 2014-07-29 NOTE — Progress Notes (Signed)
1330 called [employer] and gave report to [employer].

## 2014-07-29 NOTE — Clinical Social Work Psychosocial (Addendum)
Clinical Social Work Department BRIEF PSYCHOSOCIAL ASSESSMENT 07/28/2014  Patient:  Rachael [name],Rachael [name]     Account Number:  [id number]     Admit date:  07/17/2014  Clinical Social Worker:  ,, LCSW  Date/Time:  07/28/2014 10:53 AM  Referred by:  Physician  Date Referred:  07/27/2014 Referred for  Residential hospice placement   Other Referral:   Interview type:  Other - See comment Other interview type:   Patient, daughter and HCPOA/Friend    PSYCHOSOCIAL DATA Living Status:  ALONE Admitted from facility:   Level of care:   Primary support name:  Carol Wilson  [phone] Primary support relationship to patient:  CHILD, ADULT Degree of support available:   Carol is a step-daughter  Brenda Overman- Friend- HCPOA and Financial POA  [phone]    CURRENT CONCERNS Current Concerns  Post-Acute Placement   Other Concerns:   SNF vs Residential Hospice vs Home with Hospice    SOCIAL WORK ASSESSMENT / PLAN 81 year old female referred to CSW to discuss discharge options- per MD- patient's condition has deteriorated over the weekend and he feels she is now eligible for hospice services. Patient adamantly does not want to seek SNF placement and she lives alone; her step-daughter Carol is visiting from [place] and while supportive, does not feel she can manage patient's care at home.  Daughter and POA are very worried that while they want to give patient the ability to choose her d/c options.  Daughter states that patient has shown marked deteriorating recently; she is not eating (only taking a few sips or bites daily).  Palliative Care is following patient and prior she was a Full Code. CSW discussed with MD and he requested that Palliative Care physician revisit with patient/family tomorrow.  MD states he will discuss residential hospice option with patient. Daughter is in full support of this option.   Assessment/plan status:  Psychosocial Support/Ongoing Assessment of  Needs Other assessment/ plan:   Information/referral to community resources:   Residential Hospice list reviewed- only [facility] will be considered- otherwise will plan home with hospice care and private duty services and daughter assisting at home.    PATIENT'S/FAMILY'S RESPONSE TO PLAN OF CARE: Patient is alert, oriented and very frail appearing. She has been living alone prior to this hospitalization and daughter states she was quite independent. She feels that her mother will be open to hospice services but she adamantly does not want SNF.  CSW met briefly with patient and will follow up with her after Palliative Care MD speaks to her in the morning.  CSW spoke to Eva Davis, LCSW- [facility] for [facility] and she will follow up with patient/family tomorrow if patient agrees to residential hospice.         

## 2014-07-29 NOTE — Discharge Summary (Signed)
Rachael [name], 81 y.o., DOB [birth year], MRN [id number]. Admission date: 07/17/2014 Discharge Date 07/29/2014 Primary MD Alesia Richards, MD Admitting Physician No admitting provider for patient encounter.   Patient symptoms to be managed by the physician at hospice facility.  Admission Diagnosis  Acute on chronic diastolic congestive heart failure [I50.33] AKI (acute kidney injury) [N17.9]  Discharge Diagnosis   Principal Problem:   Acute renal failure Active Problems:   CKD (chronic kidney disease), stage III   Hypertension   Hypothyroidism   Renal artery stenosis   CHF (congestive heart failure)   Acute renal failure superimposed on stage 3 chronic kidney disease   Acute on chronic diastolic congestive heart failure   Dysphagia, pharyngoesophageal phase   Anxiety state   Palliative care encounter   CN (constipation)   Nausea without vomiting   Schatzki's ring      Past Medical History  Diagnosis Date  . Vitamin D deficiency   . Diverticulosis   . Coronary artery calcification seen on CAT scan   . TIA (transient ischemic attack)     Carotid dopplers (8/11): 40-50% RICA stenosis. Carotid dopplers (12/13) with 40-59% bilateral ICA stenoses.   . CKD (chronic kidney disease)   . Inguinal hernia   . Broken hip     bilateral  . Hypertension   . Hyperlipidemia   . Hypothyroidism   . Senile osteoporosis   . Osteopenia   . Elevated hemoglobin A1c   . Bradycardia     Holter monitor (8/11) with no significant bradycardia, frequent PACs, few short runs of slow atrial tachycardia.   . Chronic diastolic heart failure     a. Echo (6/15):  EF 60-65%, Gr 2 DD, mild AS (mean 8 mmHg);  b.  Echo (11/2013):  mild LVH, EF 55-60%, no RWMA, Gr 1 DD, mild AS (peak 19 mmHg), mild MR, trivial eff   . Aortic stenosis     a. mild (echo 6/15 with mean gradient 8 mmHg)  . Carotid stenosis     a. Carotid US (04/2012):  bilateral ICA 40-59%;  b.  Carotid US (8/15): Bilateral ICA 40-59% -  follow up 1 year   . Anxiety   . Gastritis 10/2013    EGD showed erosive gastritis and non-obstructing Schatzki's ring.     Past Surgical History  Procedure Laterality Date  . Inguinal hernia repair    . Tonsillectomy    . Hip arthroplasty  04/01/2012    Procedure: ARTHROPLASTY BIPOLAR HIP;  Surgeon: Kerin Salen, MD;  Location: [facility];  Service: Orthopedics;  Laterality: Left;  . Compression hip screw  05/06/2012    Procedure: COMPRESSION HIP;  Surgeon: Jolyn Nap, MD;  Location: [facility];  Service: Orthopedics;  Laterality: Right;  . Skin lesion excision      benignt; from shoulder  . Hip surgery      left  . Eye surgery      bilateral  . Eye surgery      tear duct R side  . Blepharoplasty  1983  . Melanoma excision  1991    Right Arm  . Total hip arthroplasty Right 2013  . Total hip arthroplasty Left 2013  . Esophagogastroduodenoscopy N/A 11/12/2013    Procedure: ESOPHAGOGASTRODUODENOSCOPY (EGD);  Surgeon: Jerene Bears, MD;  Location: [facility];  Service: Endoscopy;  Laterality: N/A;  . Esophagogastroduodenoscopy N/A 07/26/2014    Procedure: ESOPHAGOGASTRODUODENOSCOPY (EGD);  Surgeon: Inda Castle, MD;  Location: [place];  Service: Endoscopy;  Laterality:  N/A;  Azzie Almas dilation N/A 07/26/2014    Procedure: SAVORY DILATION;  Surgeon: Inda Castle, MD;  Location: [place];  Service: Endoscopy;  Laterality: N/A;     Hospital Course See H&P, Labs, Consult and Test reports for all details in brief, patient was admitted for **  Principal Problem:   Acute renal failure Active Problems:   CKD (chronic kidney disease), stage III   Hypertension   Hypothyroidism   Renal artery stenosis   CHF (congestive heart failure)   Acute renal failure superimposed on stage 3 chronic kidney disease   Acute on chronic diastolic congestive heart failure   Dysphagia, pharyngoesophageal phase   Anxiety state   Palliative care encounter   CN (constipation)   Nausea without  vomiting   Schatzki's ring  81 year old Caucasian female with history of diastolic diastolic dysfunction, chronic kidney disease stage V, early controlled hypertension, who was admitted to the hospital for CHF, diuresis has been in challenge due to poor renal function, cardiology renal both following S. She is still short of breath has edema and requiring oxygen. Patient was seen by palliative care, giving her worsening renal function, and she is not hemodialysis candidate, there was question of renal artery stenosis, but she is not a candidate for intervention, her hospital stay was complicated by constipation, which improved with good bowel regimen, as well she had an episode of dysphagia, she is known to have Schatzki ring and endoscopy in May 2015. patient had endoscopy on 3/4, demonstrated a nonobstructing Schatzki's ring. This was dilated to 18 mm. Patient continues to have overall deterioration of functional status, has extremely poor oral intake, discussed with step daughter, and health care power of attorney [name] at bedside regarding comfort care measures, given patient overall poor prognosis, and life quality, and decision has been made to pursue comfort care measures only, with no active measures including no blood work, or studies, and plan is to discharge with hospice. Discussed with patient, she did want to go home with home hospice, but told her the level of care she needs can't be provided for her at home, and at this point best option would be for her residential hospice which she is agreeable with.  acute on chronic diastolic CHF EF 73% on recent echo - will continue with oral diuresis as a comfort care measure to prevent dyspnea .  Acute on chronic renal failure and kidney disease stage IV. Baseline creatinine around 2, diurese and monitor. Likely due to CHF decompensation. Avoid nephrotoxins. Renal following. - Possible renal artery stenosis, high risk for intervention . - Not a  candidate for hemodialysis .   Dysphagia - endoscopy on 3/4, demonstrated a nonobstructing Schatzki's ring. This was dilated to 18 mm. - on soft diet , on discharge.  - cont with prn Zofran, Compazine, phenergan.   Essential hypertension.  - will place on catapres patch for HTN control.    Hyperglycemia    Constipation with abdominal pain.  Abdominal x-ray shows no obstruction, continue with laxatives as needed.   Hypokalemia. - No further labs   Hypothyroidism.   Dyslipidemia.    Procedures  endoscopy on 3/4, demonstrated a nonobstructing Schatzki's ring. This was dilated to 18 mm.   [facility]. nephrology [facility]  Code Status: DNR. D/W patient and step daughter at bedside, and health care power of attorney [name]  , patient has been made comfort care.     Significant Tests:  See full reports for all details  Dg Chest 2 View  07/17/2014   CLINICAL DATA:  Worsening shortness of breath over 2 days  EXAM: CHEST  2 VIEW  COMPARISON:  11/29/2013  FINDINGS: Cardiac shadow is within normal limits. Mild vascular congestion is seen. Small bilateral pleural effusions are noted but improved from the prior exam. Bibasilar scarring is seen. Bilateral nipple shadows are noted.  IMPRESSION: Small bilateral pleural effusions.  Mild vascular congestion.   Electronically Signed   By: [name] M.D.   On: 07/17/2014 13:49   Dg Esophagus  07/25/2014   CLINICAL DATA:  Dysphagia with foreign body sensation in throat after eating [place] toast this morning. Question food impaction. Initial encounter.  EXAM: ESOPHOGRAM/BARIUM SWALLOW  TECHNIQUE: Single contrast examination was performed using  thin barium.  FLUOROSCOPY TIME:  Examination was performed using low dose fluoroscopy. Images were recorded with fluoro-store. Number of separate spot images:None. Dose area product: 288.65 uGy*m2.  COMPARISON:  Chest radiographs 07/17/2014.  FINDINGS: Study  is limited by the patient's limited mobility. Examination was performed in the supine semi-erect and right anterior oblique prone positions.  Initial swallowing in the AP semi-erect position demonstrates moderate presbyesophagus. There is no evidence of impacted object within the esophagus. No mucosal lesion or fixed stricture identified.  Additional imaging was performed in the right anterior oblique prone position. The esophageal motility is similar. No mucosal lesion, stricture or impacted foreign body demonstrated. Contrast enters the stomach. A barium tablet was not administered.  IMPRESSION: 1. No evidence of impacted food within the esophagus. 2. No evidence of mucosal lesion or stricture. 3. Moderate presbyesophagus.   Electronically Signed   By: Richardean Sale M.D.   On: 07/25/2014 18:35   Dg Chest Port 1 View  07/26/2014   CLINICAL DATA:  Shortness of breath  EXAM: PORTABLE CHEST - 1 VIEW  COMPARISON:  07/17/2014  FINDINGS: Cardiac shadow is stable. Increasing bibasilar infiltrates and effusions are seen. Increased vascular congestion is noted with interstitial edema.  IMPRESSION: Changes consistent with CHF and increasing effusions. Increasing bibasilar infiltrates are noted particularly the left retrocardiac region.   Electronically Signed   By: [name] M.D.   On: 07/26/2014 10:44   Dg Abd Portable 1v  07/23/2014   CLINICAL DATA:  Abdominal pain since last night.  EXAM: PORTABLE ABDOMEN - 1 VIEW  COMPARISON:  CT of 03/28/2007  FINDINGS: 2 supine views. Dense thoracoabdominal aortic atherosclerosis. No bowel distention. Moderate amount of descending colonic and rectosigmoid stool. Equivocal calcifications projecting over the left side of the abdomen. Favored to be vascular. Left hip arthroplasty and right proximal femoral fixation.  Small bilateral pleural effusions with adjacent airspace disease.  IMPRESSION: No bowel obstruction or free intraperitoneal air. Possible constipation.  Probable  vascular calcifications projecting over the left side of the abdomen. Cannot exclude left sided renal calculi. Of note, no stones are identified on the remote CT.  Small bilateral pleural effusions with adjacent airspace disease. Suboptimally evaluated.   Electronically Signed   By: Abigail Miyamoto M.D.   On: 07/23/2014 09:12     Today   Subjective:   Shalunda Lindh today has no headache, still complains of nausea, denies any abdominal pain, reports poor appetite.  Objective:   Blood pressure 210/73, pulse 78, temperature 97.8 F (36.6 C), temperature source Oral, resp. rate 18, height 5' 8.75" (1.746 m), weight 47.809 kg (105 lb 6.4 oz), SpO2 100 %.  Intake/Output Summary (Last 24 hours) at 07/29/14 1143 Last data filed at 07/29/14 1029  Gross per 24 hour  Intake   2012 ml  Output   1351 ml  Net    661 ml    Exam Awake , more lethargic, frail, elderly female ,No new F.N deficits, Normal affect [place].AT,PERRAL Supple Neck,No JVD, No cervical lymphadenopathy appriciated.  Symmetrical Chest wall movemen fair air movement bilaterally, rales RRR,No Gallops,Rubs or new Murmurs, No Parasternal Heave +ve B.Sounds, Abd Soft, No tenderness, No organomegaly appriciated, No rebound - guarding or rigidity. No Cyanosis, Clubbing , trace edema, No new Rash or bruise  Data Review   Cultures -   CBC w Diff: Lab Results  Component Value Date   WBC 11.2* 07/27/2014   HGB 11.4* 07/27/2014   HCT 35.3* 07/27/2014   PLT 416* 07/27/2014   LYMPHOPCT 5* 07/17/2014   MONOPCT 9 07/17/2014   EOSPCT 0 07/17/2014   BASOPCT 0 07/17/2014   CMP: Lab Results  Component Value Date   NA 143 07/28/2014   K 3.0* 07/28/2014   CL 97 07/28/2014   CO2 36* 07/28/2014   BUN 70* 07/28/2014   CREATININE 2.14* 07/28/2014   CREATININE 2.01* 07/04/2014   PROT 6.5 07/04/2014   ALBUMIN 3.4* 07/27/2014   BILITOT 0.6 07/04/2014   ALKPHOS 75 07/04/2014   AST 23 07/04/2014   ALT 17 07/04/2014  .  Micro  Results No results found for this or any previous visit (from the past 240 hour(s)).   Discharge Instructions      Follow-up Information    Follow up with [facility].   Why:  Home Health RN, Physical Therapy and aide   Contact information:   [address] [place] [address] [phone]       Discharge Medications     Medication List    STOP taking these medications        allopurinol 100 MG tablet  Commonly known as:  ZYLOPRIM     aspirin EC 81 MG tablet     atorvastatin 20 MG tablet  Commonly known as:  LIPITOR     Biotin 1000 MCG tablet     bisoprolol 10 MG tablet  Commonly known as:  ZEBETA     cholecalciferol 1000 UNITS tablet  Commonly known as:  VITAMIN D     cloNIDine 0.2 MG tablet  Commonly known as:  CATAPRES  Replaced by:  cloNIDine 0.1 mg/24hr patch     hydrALAZINE 100 MG tablet  Commonly known as:  APRESOLINE     levothyroxine 175 MCG tablet  Commonly known as:  SYNTHROID, LEVOTHROID     minoxidil 2.5 MG tablet  Commonly known as:  LONITEN     multivitamin with minerals Tabs tablet     pantoprazole 40 MG tablet  Commonly known as:  PROTONIX     potassium chloride SA 20 MEQ tablet  Commonly known as:  K-DUR,KLOR-CON     spironolactone 25 MG tablet  Commonly known as:  ALDACTONE      TAKE these medications        albuterol (2.5 MG/3ML) 0.083% nebulizer solution  Commonly known as:  PROVENTIL  Inhale 3 mLs (2.5 mg total) into the lungs every 6 (six) hours as needed for wheezing or shortness of breath.     amLODipine 5 MG tablet  Commonly known as:  NORVASC  Take 1 tablet (5 mg total) by mouth daily.     bisacodyl 10 MG suppository  Commonly known as:  DULCOLAX  Place 1 suppository (10 mg total)  rectally daily. Hold for diarrhea.     cloNIDine 0.1 mg/24hr patch  Commonly known as:  CATAPRES-TTS-1  Place 1 patch (0.1 mg total) onto the skin once a week.     furosemide 40 MG tablet  Commonly known as:   LASIX  Take 2 tablets (80 mg total) by mouth 2 (two) times daily.     LORazepam 2 MG/ML injection  Commonly known as:  ATIVAN  Inject 0.5 mLs (1 mg total) into the muscle every 3 (three) hours as needed for anxiety (shortness of breath).     metoCLOPramide 5 MG/ML injection  Commonly known as:  REGLAN  Inject 1 mL (5 mg total) into the muscle every 6 (six) hours.     morphine 2 MG/ML injection  Inject 0.5-1 mLs (1-2 mg total) into the skin every 2 (two) hours as needed (pain, Shortness of breath).     ondansetron 4 MG/2ML Soln injection  Commonly known as:  ZOFRAN  Inject 2 mLs (4 mg total) into the muscle every 6 (six) hours as needed for nausea.     polyvinyl alcohol 1.4 % ophthalmic solution  Commonly known as:  LIQUIFILM TEARS  Place 1 drop into both eyes 3 (three) times daily.     prochlorperazine 5 MG/ML injection  Commonly known as:  COMPAZINE  Inject 1-2 mLs (5-10 mg total) into the muscle every 4 (four) hours as needed.     promethazine 25 MG/ML injection  Commonly known as:  PHENERGAN  Inject 1 mL (25 mg total) into the muscle every 4 (four) hours as needed for nausea or vomiting.     senna-docusate 8.6-50 MG per tablet  Commonly known as:  Senokot-S  Take 2 tablets by mouth 2 (two) times daily.     sodium chloride 0.65 % Soln nasal spray  Commonly known as:  OCEAN  Place 1 spray into both nostrils as needed for congestion.     sodium chloride 5 % ophthalmic solution  Commonly known as:  MURO 128  Place 1 drop into both eyes at bedtime.     SYSTANE BALANCE OP  Apply 1-2 drops to eye 3 (three) times daily.         Total Time in preparing paper work, data evaluation and todays exam - 35 minutes  Jeanetta Alonzo M.D on 07/29/2014 at 11:43 AM  [facility] Office  [phone]

## 2014-08-01 ENCOUNTER — Telehealth: Payer: Self-pay | Admitting: Cardiology

## 2014-08-01 NOTE — Telephone Encounter (Signed)
May given whatever bowel regimen the hospice nurse typically uses.  Usually they manage this.

## 2014-08-01 NOTE — Telephone Encounter (Signed)
Will forward to Dr. Aundra Dubin to address

## 2014-08-01 NOTE — Telephone Encounter (Signed)
New Msg      Maura from calling Hospice.  Pt has been started on Morphine, nurse needs a bowel regime.  Pt will need this added to orders.   Please contact [phone].

## 2014-08-02 NOTE — Telephone Encounter (Signed)
Attempted to call no answer, will attempt again later.

## 2014-08-02 NOTE — Telephone Encounter (Signed)
This patient is followed in [facility].

## 2014-08-05 ENCOUNTER — Other Ambulatory Visit (HOSPITAL_COMMUNITY): Payer: Self-pay | Admitting: *Deleted

## 2014-08-05 ENCOUNTER — Telehealth: Payer: Self-pay

## 2014-08-05 MED ORDER — CLONIDINE HCL 0.1 MG/24HR TD PTWK: 0.1000 mg | MEDICATED_PATCH | TRANSDERMAL | Status: DC

## 2014-08-05 NOTE — Telephone Encounter (Signed)
Will send message to [facility] to discharge patient from their services.  Renee Pain

## 2014-08-05 NOTE — Telephone Encounter (Signed)
Hospice nurse calling in regards to patient Rachael [name]. Since patient is on hospice there needs to be an order to stop home health care. The only information the nurse had was the name of the nurse coming out to patient's house.  The nurse is Hennie Duos and states he was referred by Dr. Aundra Dubin. Will forward to Dr. Aundra Dubin and his nurse Webb Silversmith.

## 2014-08-07 ENCOUNTER — Other Ambulatory Visit (HOSPITAL_COMMUNITY): Payer: Self-pay

## 2014-08-07 DIAGNOSIS — I5020 Unspecified systolic (congestive) heart failure: Secondary | ICD-10-CM

## 2014-08-20 ENCOUNTER — Other Ambulatory Visit (INDEPENDENT_AMBULATORY_CARE_PROVIDER_SITE_OTHER): Admitting: *Deleted

## 2014-08-20 DIAGNOSIS — E785 Hyperlipidemia, unspecified: Secondary | ICD-10-CM | POA: Diagnosis not present

## 2014-08-20 LAB — LIPID PANEL
CHOLESTEROL: 209 mg/dL — AB (ref 0–200)
HDL: 47.3 mg/dL (ref >39.00)
LDL CALC: 137 mg/dL — AB (ref 0–99)
NONHDL: 161.7
Total CHOL/HDL Ratio: 4
Triglycerides: 124 mg/dL (ref 0.0–149.0)
VLDL: 24.8 mg/dL (ref 0.0–40.0)

## 2014-08-20 LAB — HEPATIC FUNCTION PANEL
ALBUMIN: 3.3 g/dL — AB (ref 3.5–5.2)
ALT: 13 U/L (ref 0–35)
AST: 17 U/L (ref 0–37)
Alkaline Phosphatase: 66 U/L (ref 39–117)
BILIRUBIN DIRECT: 0 mg/dL (ref 0.0–0.3)
BILIRUBIN TOTAL: 0.4 mg/dL (ref 0.2–1.2)
Total Protein: 6.2 g/dL (ref 6.0–8.3)

## 2014-08-22 ENCOUNTER — Telehealth (HOSPITAL_COMMUNITY): Payer: Self-pay | Admitting: Cardiology

## 2014-08-22 MED ORDER — METOLAZONE 2.5 MG PO TABS: 2.5000 mg | ORAL_TABLET | ORAL | Status: DC

## 2014-08-22 MED ORDER — POTASSIUM CHLORIDE CRYS ER 20 MEQ PO TBCR: 20.0000 meq | EXTENDED_RELEASE_TABLET | ORAL | Status: DC

## 2014-08-22 NOTE — Telephone Encounter (Signed)
Leandro Reasoner, RN called during pts [employer] visit pts weight is up 8 lbs over 9 days 1+ pitting edema in LE + abdominal distension HR irregular B/p 132/56 Denies dyspnea on 3L O2  fyi- pt dismissed home/private duty nursing And made daughter return to her own residence    Will follow up with provider to see if anything should be done?

## 2014-08-22 NOTE — Telephone Encounter (Signed)
Per Dr Haroldine Laws have pt take metolazone 2.5 mg with kcl 20 meq times 1 dose, left VM for [facility] as to the plan, have discussed w/pt, she is aware and agreeable, rx for meds sent to pharmacy

## 2014-08-28 ENCOUNTER — Telehealth (HOSPITAL_COMMUNITY): Payer: Self-pay

## 2014-08-28 NOTE — Telephone Encounter (Signed)
Patients nurse left a VM with triage service about patient having 3+ bilateral lower extremity edema.  Weight was up 1 lb from baseline, no shortness of breath noted.  Per Dr. Aundra Dubin, instructed patient to take a 2.5mg  metolazone tablet with a potassium tablet.  Will be seen in our office first thing tomorrow morning.  Patient reminded of appointment and aware and agreeable to plan.  Renee Pain

## 2014-08-29 ENCOUNTER — Telehealth (HOSPITAL_COMMUNITY): Payer: Self-pay | Admitting: Vascular Surgery

## 2014-08-29 ENCOUNTER — Ambulatory Visit (HOSPITAL_COMMUNITY)
Admission: RE | Admit: 2014-08-29 | Discharge: 2014-08-29 | Disposition: A | Discharge status: Home / Self Care | Source: Ambulatory Visit | Attending: Cardiology | Admitting: Cardiology

## 2014-08-29 ENCOUNTER — Encounter: Payer: Self-pay | Admitting: Internal Medicine

## 2014-08-29 VITALS — BP 144/72 | HR 85 | Wt 119.0 lb

## 2014-08-29 DIAGNOSIS — I350 Nonrheumatic aortic (valve) stenosis: Secondary | ICD-10-CM | POA: Insufficient documentation

## 2014-08-29 DIAGNOSIS — Z79899 Other long term (current) drug therapy: Secondary | ICD-10-CM | POA: Diagnosis not present

## 2014-08-29 DIAGNOSIS — E039 Hypothyroidism, unspecified: Secondary | ICD-10-CM | POA: Diagnosis not present

## 2014-08-29 DIAGNOSIS — Z8673 Personal history of transient ischemic attack (TIA), and cerebral infarction without residual deficits: Secondary | ICD-10-CM | POA: Insufficient documentation

## 2014-08-29 DIAGNOSIS — F0390 Unspecified dementia without behavioral disturbance: Secondary | ICD-10-CM | POA: Insufficient documentation

## 2014-08-29 DIAGNOSIS — E43 Unspecified severe protein-calorie malnutrition: Secondary | ICD-10-CM

## 2014-08-29 DIAGNOSIS — I129 Hypertensive chronic kidney disease with stage 1 through stage 4 chronic kidney disease, or unspecified chronic kidney disease: Secondary | ICD-10-CM | POA: Insufficient documentation

## 2014-08-29 DIAGNOSIS — I6529 Occlusion and stenosis of unspecified carotid artery: Secondary | ICD-10-CM | POA: Diagnosis not present

## 2014-08-29 DIAGNOSIS — I5032 Chronic diastolic (congestive) heart failure: Secondary | ICD-10-CM | POA: Insufficient documentation

## 2014-08-29 DIAGNOSIS — I739 Peripheral vascular disease, unspecified: Secondary | ICD-10-CM | POA: Diagnosis not present

## 2014-08-29 DIAGNOSIS — N189 Chronic kidney disease, unspecified: Secondary | ICD-10-CM | POA: Diagnosis not present

## 2014-08-29 LAB — BASIC METABOLIC PANEL
Anion gap: 13 (ref 5–15)
BUN: 17 mg/dL (ref 6–23)
CALCIUM: 9.4 mg/dL (ref 8.4–10.5)
CHLORIDE: 92 mmol/L — AB (ref 96–112)
CO2: 32 mmol/L (ref 19–32)
Creatinine, Ser: 1.3 mg/dL — ABNORMAL HIGH (ref 0.50–1.10)
GFR calc Af Amer: 41 mL/min — ABNORMAL LOW (ref >90)
GFR, EST NON AFRICAN AMERICAN: 36 mL/min — AB (ref >90)
Glucose, Bld: 104 mg/dL — ABNORMAL HIGH (ref 70–99)
Potassium: 3.2 mmol/L — ABNORMAL LOW (ref 3.5–5.1)
SODIUM: 137 mmol/L (ref 135–145)

## 2014-08-29 LAB — BRAIN NATRIURETIC PEPTIDE: B NATRIURETIC PEPTIDE 5: 114.2 pg/mL — AB (ref 0.0–100.0)

## 2014-08-29 MED ORDER — ATORVASTATIN CALCIUM 20 MG PO TABS: 20.0000 mg | ORAL_TABLET | Freq: Every day | ORAL | Status: DC

## 2014-08-29 MED ORDER — ASPIRIN EC 81 MG PO TBEC: 81.0000 mg | DELAYED_RELEASE_TABLET | Freq: Every day | ORAL | Status: AC

## 2014-08-29 NOTE — Telephone Encounter (Signed)
Nurse from hospice called .Marland Kitchen She would like to speak to a nurse to go over pt medication changes.. Please advise

## 2014-08-29 NOTE — Telephone Encounter (Signed)
Nurse from hospice called to discuss patient's visit from today.  Informed me that we must notify her in advance of any medication adjustments or new medications before they are ordered so they can prior authorize the medications.  Informed her that this would not be abel to be done before her appointment as she must be examed first.  Nurse also concerned about patient having 3+ edema yesterday.  Informed her that we did speak to Dr. Aundra Dubin about this, and that the patient was called and informed to take a metolazone yesterday.  Per Dr. Aundra Dubin, patient felt great today and her exam was the best he has seen her in awhile.  Nurse requested note from clinic to be sent to her once signed.  Informed that this would probably be tomorrow before that is done.    Renee Pain

## 2014-08-29 NOTE — Progress Notes (Signed)
Patient ID: Rachael [name], female   DOB: 1924/01/26, 81 y.o.   MRN: [id number] PCP: Dr. Melford Aase  81 yo with history of HTN, TIAs, and chronic diastolic CHF presents for cardiology followup.  She fractured both of her femurs, once in 11/13 and once in 12/13.  She had surgery both times.  She was then admitted in 5/14 with dehydration and acute on chronic renal failure.  During the admission with femur fracture in 12/13, she was told she had "fluid around her lungs."  She was taken off bumetanide when she was admitted with dehydration in 5/14 and put on Lasix later on.  In 6/15, she was admitted with upper GI bleed from erosive esophagitis and had AKI, diuretics were held.  In 7/15, she was then admitted with acute on chronic diastolic CHF and Lasix was restarted.  She has had several syncopal events and was set up for an event monitor by Richardson Dopp.  This showed only mild sinus bradycardia, no significant events.  She was found to have significant right renal artery stenosis by renal artery dopplers.   She was admitted in 2/16 with AKI, hypertension, and acute on chronic diastolic CHF.  Creatinine worsened considerably with attempts at diuresis.  She had severe anxiety.  She ended up going home with hospice care.  However, she has done well since getting home.  Creatinine has been trending down.  She is walking with a walker without significant dyspnea.  She has some lower leg edema but cannot get compression stockings on.  Oxygen saturation has been good off oxygen during the day.  She is no longer using oxygen during day, now only at night. SBP running in the 140s.   Labs (5/14): K 4.3, creatinine 1.5 Labs (7/14): K 4.3, creatinine 1.5, LDL 55, HDL 51 Labs (8/15): K 3.5, creatinine 1.7, BNP 64, HCT 33.2  Labs (9/15): K 3.7, creatinine 1.5 Labs (04/2014): K 4.1 Creatinine 1.77  Labs (07/28/2014): K 3.0 Creatinine 2.14   Allergies (verified):  No Known Drug Allergies   Past Medical History:  1.  DIVERTICULOSIS, COLON   2. HYPOTHYROIDISM  3. HYPERTENSION: Resistant.  >60% right renal artery stenosis (1/16).  4. TIA: x 2, manifested by diplopia. Carotid dopplers (8/11): 40-50% RICA stenosis.  Carotid dopplers (12/13) with 40-59% bilateral ICA stenoses. Carotids (8/15) with bilateral ICA 40-59% stenoses.  5. Bradycardia: Mild.  Holter monitor (8/11) with no significant bradycardia, frequent PACs, few short runs of slow atrial tachycardia.  6. Chronic diastolic CHF:  Echo (4/09): EF 65%, mild LV hypertrophy, aortic sclerosis without significant stenosis, mild mitral regurgitation.  Echo (12/13) with EF 65-70%, moderate TR, PA systolic pressure 55 mmHg, mild AS with mean gradient 17 mmHg. Echo (7/15) with EF 55-60%, mild LVH, mild AS, mild AI, mild MR.  7. Aortic stenosis: Mild on 7/15 echo.  8. Femur fractures in 11/13 and 12/13, had surgery for repair both times.   9. CKD 10. Upper GI bleed: 6/15, erosive esophagitis.  11. Syncope: Event monitor (8/15) with no significant events.  12. Dementia: Mild.  13. PAD: Renal/abdominal dopplers (1/16) with > 60% right renal artery stenosis, >50% distal aorta stenosis, > 70% celiac and SMA stenosis.   Family History:  No premature CAD. Mother with CVA in her 67s.   Social History:  Lives alone. Widowed. No children but has a nephew living in town. Nonsmoker.   ROS: All systems reviewed and negative except as per HPI.   Current Outpatient Prescriptions  Medication  Sig Dispense Refill  . amLODipine (NORVASC) 5 MG tablet Take 1 tablet (5 mg total) by mouth daily. 30 tablet 6  . cloNIDine (CATAPRES-TTS-1) 0.1 mg/24hr patch Place 1 patch (0.1 mg total) onto the skin once a week. 2 patch 2  . furosemide (LASIX) 40 MG tablet Take 2 tablets (80 mg total) by mouth 2 (two) times daily. 120 tablet 11  . LORazepam (ATIVAN) 2 MG/ML injection Inject 0.5 mLs (1 mg total) into the muscle every 3 (three) hours as needed for anxiety (shortness of breath). 1 mL 0   . metolazone (ZAROXOLYN) 2.5 MG tablet Take 1 tablet (2.5 mg total) by mouth as directed. 5 tablet 1  . morphine 2 MG/ML injection Inject 0.5-1 mLs (1-2 mg total) into the skin every 2 (two) hours as needed (pain, Shortness of breath). 1 mL 0  . ondansetron (ZOFRAN) 4 MG/2ML SOLN injection Inject 2 mLs (4 mg total) into the muscle every 6 (six) hours as needed for nausea. 2 mL 0  . polyvinyl alcohol (LIQUIFILM TEARS) 1.4 % ophthalmic solution Place 1 drop into both eyes 3 (three) times daily. 15 mL 0  . potassium chloride SA (K-DUR,KLOR-CON) 20 MEQ tablet Take 1 tablet (20 mEq total) by mouth as directed. Take 1 tab when you take metolazone 5 tablet 1  . prochlorperazine (COMPAZINE) 5 MG/ML injection Inject 1-2 mLs (5-10 mg total) into the muscle every 4 (four) hours as needed. 2 mL   . promethazine (PHENERGAN) 25 MG/ML injection Inject 1 mL (25 mg total) into the muscle every 4 (four) hours as needed for nausea or vomiting. 1 mL 0  . Propylene Glycol (SYSTANE BALANCE OP) Apply 1-2 drops to eye 3 (three) times daily.    Marland Kitchen senna-docusate (SENOKOT-S) 8.6-50 MG per tablet Take 2 tablets by mouth 2 (two) times daily.    . sodium chloride (MURO 128) 5 % ophthalmic solution Place 1 drop into both eyes at bedtime.    . sodium chloride (OCEAN) 0.65 % SOLN nasal spray Place 1 spray into both nostrils as needed for congestion.  0  . albuterol (PROVENTIL) (2.5 MG/3ML) 0.083% nebulizer solution Inhale 3 mLs (2.5 mg total) into the lungs every 6 (six) hours as needed for wheezing or shortness of breath. (Patient not taking: Reported on 08/29/2014) 75 mL 12  . bisacodyl (DULCOLAX) 10 MG suppository Place 1 suppository (10 mg total) rectally daily. Hold for diarrhea. (Patient not taking: Reported on 08/29/2014) 12 suppository 0  . metoCLOPramide (REGLAN) 5 MG/ML injection Inject 1 mL (5 mg total) into the muscle every 6 (six) hours. (Patient not taking: Reported on 08/29/2014) 2 mL 0   No current facility-administered  medications for this encounter.    There were no vitals taken for this visit. General: NAD Neck: JVP 5-6 cm, no thyromegaly or thyroid nodule.  Lungs: Clear to auscultation bilaterally with normal respiratory effort. CV: Nondisplaced PMI.  Heart regular S1/S2, no S3/S4, 2/6 early systolic murmur RUSB.  1+ edema 3/4 to knee L>R.  Bilateral carotid bruits.  Normal pedal pulses.  Abdomen: Soft, nontender, no hepatosplenomegaly, no distention.  Neurologic: Alert and oriented x 3.  Psych: Normal affect. Extremities: No clubbing or cyanosis.   Assessment/Plan: 1. Chronic diastolic CHF: Volume status stable. She has some peripheral edema but I will accept this for now.  No JVD.  May be related to amlodipine use.   - Continue lasix 80mg  twice a day.   - Cut back potassium 20 meq daily.   -  BMET/BNP today.  2. HTN: BP very mildly elevated, this actually looks pretty good for her. Continue amlodipine 5 mg daily, clonidine patch.  Add 25 mg spironolactone daily due to resistant HTN.  She has right renal artery stenosis by renal artery doppler evaluation.  3. Carotid stenosis: Repeat carotids in 8/16.    4. Aortic stenosis: Mild, continue to follow over time.  5. Syncope: No recurrence.  Event monitor showed no significant arrhythmias. Echo showed mild AS. 6. Memory Impairment: In the past on aricept . 7. CKD: AKI in hospital, trended down.  BMET today.  8. PAD: Right renal artery stenosis, > 50% distal aorta stenosis, celiac and SMA stenoses, carotid stenosis. I will have her restart ASA 81 mg daily and atorvastatin 20 mg daily (lipids/LFTs in 2 months.) given extensive vascular diseae.    CLEGG,AMY NP-C  08/29/2014   Patient seen with NP, agree with the above note. Continue current Lasix, may have to accept some lower extremity edema.  This is likely worsened by amlodipine.  BP is reasonable.  Restart ASA/statin with extensive vascular disease.   Loralie Champagne 08/29/2014

## 2014-08-29 NOTE — Patient Instructions (Signed)
Restart Aspirin 81 mg daily  Restart Atorvastatin 20 mg daily  Labs today  Your physician recommends that you schedule a follow-up appointment in: 3 weeks

## 2014-08-30 ENCOUNTER — Telehealth (HOSPITAL_COMMUNITY): Payer: Self-pay | Admitting: *Deleted

## 2014-08-30 NOTE — Telephone Encounter (Signed)
Last office note faxed to [phone]  Attn: Lynn Ito

## 2014-09-02 ENCOUNTER — Telehealth (HOSPITAL_COMMUNITY): Payer: Self-pay | Admitting: *Deleted

## 2014-09-02 MED ORDER — POTASSIUM CHLORIDE CRYS ER 20 MEQ PO TBCR: 20.0000 meq | EXTENDED_RELEASE_TABLET | Freq: Every day | ORAL | Status: DC

## 2014-09-02 NOTE — Telephone Encounter (Signed)
Pt aware and agreeable.  

## 2014-09-02 NOTE — Telephone Encounter (Signed)
-----   Message from Larey Dresser, MD sent at 08/31/2014 11:55 PM EDT ----- Add KCl 20 daily.  BNP much lower.

## 2014-09-03 ENCOUNTER — Telehealth (HOSPITAL_COMMUNITY): Payer: Self-pay | Admitting: Cardiology

## 2014-09-03 NOTE — Telephone Encounter (Signed)
FYI as Leandro Reasoner would like to speak with Esau Grew, RN Aware Nira Conn has left for the day and she should return her call on 09/04/14

## 2014-09-03 NOTE — Telephone Encounter (Signed)
Mora,RN aware Reports pt is very stubborn and has no help in the home If someone was putting her stocking on every morning she would be unable to take them off at night [facility] given, mild compression stockings, however unsure how compliant pt will be with daily use  To clarify all medications and orders Office note will be faxed to [place]

## 2014-09-03 NOTE — Telephone Encounter (Signed)
She needs to be on Lasix 80 mg bid and KCl 20 as recommended at office visit.  Want to see how she does at this dose for now.  Nurse can put on compression stockings.  She is on high dose of Lasix with this and would like to keep her stable on this dose without sliding scale for now as long as she is not short of breath. She will have some swelling.

## 2014-09-03 NOTE — Telephone Encounter (Signed)
Leandro Reasoner, RN with Hospice called during pts home visits Called with concerns regarding LE edema and 3 lb weight gain Pt should be taking Lasix 80mg  BID and KCL 20 Meq daily however as of 09/03/14 pt is only taking 80 mg daily and has not picked RX for KCL   RN would like sliding scale diuretic order and for office to call with EVERY medication change. States pt is confused and does live alone, she is very concerned about her safety. Medication changes were made on 09/02/14 and pt did not understand orders given Will fax over most recent office visit and medication list   Dr.McLean please assist with sliding scale diuretic order.

## 2014-09-06 ENCOUNTER — Encounter: Payer: Self-pay | Admitting: Internal Medicine

## 2014-09-10 ENCOUNTER — Encounter (INDEPENDENT_AMBULATORY_CARE_PROVIDER_SITE_OTHER): Payer: Medicare Other | Admitting: Ophthalmology

## 2014-09-10 DIAGNOSIS — I10 Essential (primary) hypertension: Secondary | ICD-10-CM

## 2014-09-10 DIAGNOSIS — H43813 Vitreous degeneration, bilateral: Secondary | ICD-10-CM

## 2014-09-10 DIAGNOSIS — H3532 Exudative age-related macular degeneration: Secondary | ICD-10-CM | POA: Diagnosis not present

## 2014-09-10 DIAGNOSIS — H3531 Nonexudative age-related macular degeneration: Secondary | ICD-10-CM | POA: Diagnosis not present

## 2014-09-10 DIAGNOSIS — H35033 Hypertensive retinopathy, bilateral: Secondary | ICD-10-CM

## 2014-09-12 NOTE — Op Note (Signed)
[facility] [place], [address]   ENDOSCOPY PROCEDURE REPORT  PATIENT: Rachael [name], Rachael [name]  MR#: [id number] BIRTHDATE: Apr 28, 1924 , 54  yrs. old GENDER: female ENDOSCOPIST: Inda Castle, MD REFERRED BY: PROCEDURE DATE:  07/26/2014 PROCEDURE:  EGD, diagnostic and EGD w/ balloon dilation ASA CLASS:     Class III INDICATIONS:  dysphagia. MEDICATIONS: Versed 1 mg IV and Fentanyl 25 mcg IV TOPICAL ANESTHETIC:  DESCRIPTION OF PROCEDURE: After the risks benefits and alternatives of the procedure were thoroughly explained, informed consent was obtained.  The Pentax Gastroscope [id number] endoscope was introduced through the mouth and advanced to the second portion of the duodenum , Without limitations.  The instrument was slowly withdrawn as the mucosa was fully examined.    ESOPHAGUS: A mildly severe Schatzki ring was found at the gastroesophageal junction.  Using a TTS-balloon the stricture was dilated up to 7mm.  The balloon was held inflated for 30 seconds. No heme.   Except for the findings listed the EGD was otherwise normal.  Retroflexed views revealed no abnormalities.     The scope was then withdrawn from the patient and the procedure completed.  COMPLICATIONS: There were no immediate complications.  ENDOSCOPIC IMPRESSION: 1.   Schatzki ring was found at the gastroesophageal junction; Using a TTS-balloon the stricture was dilated up to 39mm; The balloon was held inflated for 30 seconds 2.   EGD was otherwise normal  RECOMMENDATIONS: soft diet  REPEAT EXAM:  eSigned:  Inda Castle, MD 07/26/2014 2:42 PM    DU:KGURKYH Melford Aase, MD

## 2014-09-17 ENCOUNTER — Ambulatory Visit (HOSPITAL_COMMUNITY)
Admission: RE | Admit: 2014-09-17 | Discharge: 2014-09-17 | Disposition: A | Discharge status: Home / Self Care | Source: Ambulatory Visit | Attending: Cardiology | Admitting: Cardiology

## 2014-09-17 VITALS — BP 148/72 | HR 94 | Wt 119.0 lb

## 2014-09-17 DIAGNOSIS — I6529 Occlusion and stenosis of unspecified carotid artery: Secondary | ICD-10-CM | POA: Insufficient documentation

## 2014-09-17 DIAGNOSIS — F0390 Unspecified dementia without behavioral disturbance: Secondary | ICD-10-CM | POA: Insufficient documentation

## 2014-09-17 DIAGNOSIS — I10 Essential (primary) hypertension: Secondary | ICD-10-CM | POA: Diagnosis not present

## 2014-09-17 DIAGNOSIS — E039 Hypothyroidism, unspecified: Secondary | ICD-10-CM | POA: Insufficient documentation

## 2014-09-17 DIAGNOSIS — Z79899 Other long term (current) drug therapy: Secondary | ICD-10-CM | POA: Insufficient documentation

## 2014-09-17 DIAGNOSIS — K5790 Diverticulosis of intestine, part unspecified, without perforation or abscess without bleeding: Secondary | ICD-10-CM | POA: Diagnosis not present

## 2014-09-17 DIAGNOSIS — I701 Atherosclerosis of renal artery: Secondary | ICD-10-CM | POA: Diagnosis not present

## 2014-09-17 DIAGNOSIS — N189 Chronic kidney disease, unspecified: Secondary | ICD-10-CM | POA: Diagnosis not present

## 2014-09-17 DIAGNOSIS — Z7982 Long term (current) use of aspirin: Secondary | ICD-10-CM | POA: Diagnosis not present

## 2014-09-17 DIAGNOSIS — I739 Peripheral vascular disease, unspecified: Secondary | ICD-10-CM | POA: Insufficient documentation

## 2014-09-17 DIAGNOSIS — I129 Hypertensive chronic kidney disease with stage 1 through stage 4 chronic kidney disease, or unspecified chronic kidney disease: Secondary | ICD-10-CM | POA: Insufficient documentation

## 2014-09-17 DIAGNOSIS — I5032 Chronic diastolic (congestive) heart failure: Secondary | ICD-10-CM | POA: Diagnosis not present

## 2014-09-17 DIAGNOSIS — I350 Nonrheumatic aortic (valve) stenosis: Secondary | ICD-10-CM | POA: Diagnosis not present

## 2014-09-17 DIAGNOSIS — I5033 Acute on chronic diastolic (congestive) heart failure: Secondary | ICD-10-CM | POA: Diagnosis not present

## 2014-09-17 DIAGNOSIS — I2510 Atherosclerotic heart disease of native coronary artery without angina pectoris: Secondary | ICD-10-CM | POA: Diagnosis not present

## 2014-09-17 DIAGNOSIS — Z8673 Personal history of transient ischemic attack (TIA), and cerebral infarction without residual deficits: Secondary | ICD-10-CM | POA: Insufficient documentation

## 2014-09-17 LAB — BASIC METABOLIC PANEL
Anion gap: 12 (ref 5–15)
BUN: 21 mg/dL (ref 6–23)
CALCIUM: 9.1 mg/dL (ref 8.4–10.5)
CO2: 29 mmol/L (ref 19–32)
CREATININE: 1.42 mg/dL — AB (ref 0.50–1.10)
Chloride: 98 mmol/L (ref 96–112)
GFR calc Af Amer: 37 mL/min — ABNORMAL LOW (ref >90)
GFR calc non Af Amer: 32 mL/min — ABNORMAL LOW (ref >90)
GLUCOSE: 127 mg/dL — AB (ref 70–99)
Potassium: 3.4 mmol/L — ABNORMAL LOW (ref 3.5–5.1)
SODIUM: 139 mmol/L (ref 135–145)

## 2014-09-17 MED ORDER — SPIRONOLACTONE 25 MG PO TABS: 12.5000 mg | ORAL_TABLET | Freq: Every day | ORAL | Status: DC

## 2014-09-17 NOTE — Patient Instructions (Signed)
Labs today and again in 10 days (BMET)  START Spironolactone 12.5 mg daily (1/2 tab)  Your physician recommends that you schedule a follow-up appointment in: 6 weeks  Do the following things EVERYDAY: 1) Weigh yourself in the morning before breakfast. Write it down and keep it in a log. 2) Take your medicines as prescribed 3) Eat low salt foods-Limit salt (sodium) to 2000 mg per day.  4) Stay as active as you can everyday 5) Limit all fluids for the day to less than 2 liters 6)

## 2014-09-17 NOTE — Progress Notes (Signed)
Patient ID: Rachael [name], female   DOB: 1923/09/30, 81 y.o.   MRN: [id number] PCP: Dr. Melford Aase  81 yo with history of HTN, TIAs, and chronic diastolic CHF presents for cardiology followup.  She fractured both of her femurs, once in 11/13 and once in 12/13.  She had surgery both times.  She was then admitted in 5/14 with dehydration and acute on chronic renal failure.  During the admission with femur fracture in 12/13, she was told she had "fluid around her lungs."  She was taken off bumetanide when she was admitted with dehydration in 5/14 and put on Lasix later on.  In 6/15, she was admitted with upper GI bleed from erosive esophagitis and had AKI, diuretics were held.  In 7/15, she was then admitted with acute on chronic diastolic CHF and Lasix was restarted.  She has had several syncopal events and was set up for an event monitor by Richardson Dopp.  This showed only mild sinus bradycardia, no significant events.  She was found to have significant right renal artery stenosis by renal artery dopplers.   She was admitted in 2/16 with AKI, hypertension, and acute on chronic diastolic CHF.  Creatinine worsened considerably with attempts at diuresis.  She had severe anxiety.  She ended up going home with hospice care.  However, she has done well since getting home.  Creatinine has been trending down.    She is walking with a walker without significant dyspnea.  No orthopnea/PND.  Lower leg edema has resolved.  Oxygen saturation has been good off oxygen during the day.  She is no longer using oxygen during day, now only at night. SBP running in the 140s still. Weight is stable.   ECG: NSR, PACs, septal Qs  Labs (5/14): K 4.3, creatinine 1.5 Labs (7/14): K 4.3, creatinine 1.5, LDL 55, HDL 51 Labs (8/15): K 3.5, creatinine 1.7, BNP 64, HCT 33.2  Labs (9/15): K 3.7, creatinine 1.5 Labs (04/2014): K 4.1 Creatinine 1.77  Labs (07/28/2014): K 3.0 Creatinine 2.14, LDL 137, HDL 47 Labs (4/16): K 3.2, creatinine  1.3  Allergies (verified):  No Known Drug Allergies   Past Medical History:  1. DIVERTICULOSIS  2. HYPOTHYROIDISM  3. HYPERTENSION: Resistant.  >60% right renal artery stenosis (1/16).  4. TIA: x 2, manifested by diplopia. Carotid dopplers (8/11): 40-50% RICA stenosis.  Carotid dopplers (12/13) with 40-59% bilateral ICA stenoses. Carotids (8/15) with bilateral ICA 40-59% stenoses.  5. Bradycardia: Mild.  Holter monitor (8/11) with no significant bradycardia, frequent PACs, few short runs of slow atrial tachycardia.  6. Chronic diastolic CHF:  Echo (8/88): EF 65%, mild LV hypertrophy, aortic sclerosis without significant stenosis, mild mitral regurgitation.  Echo (12/13) with EF 65-70%, moderate TR, PA systolic pressure 55 mmHg, mild AS with mean gradient 17 mmHg. Echo (7/15) with EF 55-60%, mild LVH, mild AS, mild AI, mild MR.  7. Aortic stenosis: Mild on 7/15 echo.  8. Femur fractures in 11/13 and 12/13, had surgery for repair both times.   9. CKD 10. Upper GI bleed: 6/15, erosive esophagitis.  11. Syncope: Event monitor (8/15) with no significant events.  12. Dementia: Mild.  13. PAD: Renal/abdominal dopplers (1/16) with > 60% right renal artery stenosis, >50% distal aorta stenosis, > 70% celiac and SMA stenosis.   Family History:  No premature CAD. Mother with CVA in her 70s.   Social History:  Lives alone. Widowed. No children but has a nephew living in town. Nonsmoker.   ROS: All  systems reviewed and negative except as per HPI.   Current Outpatient Prescriptions  Medication Sig Dispense Refill  . albuterol (PROVENTIL) (2.5 MG/3ML) 0.083% nebulizer solution Inhale 3 mLs (2.5 mg total) into the lungs every 6 (six) hours as needed for wheezing or shortness of breath. 75 mL 12  . amLODipine (NORVASC) 5 MG tablet Take 1 tablet (5 mg total) by mouth daily. 30 tablet 6  . aspirin EC 81 MG tablet Take 1 tablet (81 mg total) by mouth daily. 90 tablet 3  . atorvastatin (LIPITOR) 20 MG  tablet Take 1 tablet (20 mg total) by mouth daily. 30 tablet 6  . cloNIDine (CATAPRES-TTS-1) 0.1 mg/24hr patch Place 1 patch (0.1 mg total) onto the skin once a week. 2 patch 2  . furosemide (LASIX) 40 MG tablet Take 2 tablets (80 mg total) by mouth 2 (two) times daily. 120 tablet 11  . LORazepam (ATIVAN) 2 MG/ML injection Inject 0.5 mLs (1 mg total) into the muscle every 3 (three) hours as needed for anxiety (shortness of breath). 1 mL 0  . metoCLOPramide (REGLAN) 5 MG/ML injection Inject 1 mL (5 mg total) into the muscle every 6 (six) hours. 2 mL 0  . metolazone (ZAROXOLYN) 2.5 MG tablet Take 1 tablet (2.5 mg total) by mouth as directed. 5 tablet 1  . morphine 2 MG/ML injection Inject 0.5-1 mLs (1-2 mg total) into the skin every 2 (two) hours as needed (pain, Shortness of breath). 1 mL 0  . ondansetron (ZOFRAN) 4 MG/2ML SOLN injection Inject 2 mLs (4 mg total) into the muscle every 6 (six) hours as needed for nausea. 2 mL 0  . polyvinyl alcohol (LIQUIFILM TEARS) 1.4 % ophthalmic solution Place 1 drop into both eyes 3 (three) times daily. 15 mL 0  . potassium chloride SA (K-DUR,KLOR-CON) 20 MEQ tablet Take 1 tablet (20 mEq total) by mouth daily. Take 1 extra tab when you take metolazone 5 tablet 1  . prochlorperazine (COMPAZINE) 5 MG/ML injection Inject 1-2 mLs (5-10 mg total) into the muscle every 4 (four) hours as needed. 2 mL   . promethazine (PHENERGAN) 25 MG/ML injection Inject 1 mL (25 mg total) into the muscle every 4 (four) hours as needed for nausea or vomiting. 1 mL 0  . Propylene Glycol (SYSTANE BALANCE OP) Apply 1-2 drops to eye 3 (three) times daily.    Marland Kitchen senna-docusate (SENOKOT-S) 8.6-50 MG per tablet Take 2 tablets by mouth 2 (two) times daily.    . sodium chloride (MURO 128) 5 % ophthalmic solution Place 1 drop into both eyes at bedtime.    . sodium chloride (OCEAN) 0.65 % SOLN nasal spray Place 1 spray into both nostrils as needed for congestion.  0  . bisacodyl (DULCOLAX) 10 MG  suppository Place 1 suppository (10 mg total) rectally daily. Hold for diarrhea. (Patient not taking: Reported on 08/29/2014) 12 suppository 0  . spironolactone (ALDACTONE) 25 MG tablet Take 0.5 tablets (12.5 mg total) by mouth daily. 15 tablet 3   No current facility-administered medications for this encounter.    BP 148/72 mmHg  Pulse 94  Wt 119 lb (53.978 kg)  SpO2 100% General: NAD Neck: JVP 7 cm, no thyromegaly or thyroid nodule.  Lungs: Clear to auscultation bilaterally with normal respiratory effort. CV: Nondisplaced PMI.  Heart regular S1/S2, no S3/S4, 2/6 early systolic murmur RUSB.  Trace ankle edema.  Bilateral carotid bruits.  Normal pedal pulses.  Abdomen: Soft, nontender, no hepatosplenomegaly, no distention.  Neurologic: Alert and  oriented x 3.  Psych: Normal affect. Extremities: No clubbing or cyanosis.   Assessment/Plan: 1. Chronic diastolic CHF: Volume status stable. Peripheral edema has resolved.  No JVD.   - Continue lasix 80mg  twice a day and KCl 20 daily.  BMET today.   - Continue potassium 20 meq daily.   2. HTN: BP mildly elevated. Continue amlodipine 5 mg daily, clonidine patch.  Add 12.5 mg spironolactone daily due to resistant HTN.  Repeat BMET in 2 wks.  She has right renal artery stenosis by renal artery doppler evaluation.  3. Carotid stenosis: Repeat carotids in 8/16.    4. Aortic stenosis: Mild, continue to follow over time.  5. Syncope: No recurrence.  Event monitor showed no significant arrhythmias. Echo showed mild AS. 6. Memory Impairment: In the past on aricept . 7. CKD: AKI in hospital, trended down.  BMET today.  8. PAD: Right renal artery stenosis, > 50% distal aorta stenosis, celiac and SMA stenoses, carotid stenosis. I had her restart ASA 81 mg daily and atorvastatin 20 mg daily (lipids/LFTs in 2 months) given extensive vascular diseae.    Loralie Champagne 09/17/2014

## 2014-09-19 ENCOUNTER — Encounter (INDEPENDENT_AMBULATORY_CARE_PROVIDER_SITE_OTHER): Payer: Medicare Other | Admitting: Ophthalmology

## 2014-09-25 ENCOUNTER — Ambulatory Visit (HOSPITAL_COMMUNITY)
Admission: RE | Admit: 2014-09-25 | Discharge: 2014-09-25 | Disposition: A | Discharge status: Home / Self Care | Source: Ambulatory Visit | Attending: Cardiology | Admitting: Cardiology

## 2014-09-25 DIAGNOSIS — I5022 Chronic systolic (congestive) heart failure: Secondary | ICD-10-CM | POA: Diagnosis not present

## 2014-09-25 DIAGNOSIS — I5033 Acute on chronic diastolic (congestive) heart failure: Secondary | ICD-10-CM | POA: Insufficient documentation

## 2014-09-25 LAB — BASIC METABOLIC PANEL
Anion gap: 15 (ref 5–15)
BUN: 24 mg/dL — ABNORMAL HIGH (ref 6–20)
CO2: 27 mmol/L (ref 22–32)
CREATININE: 1.5 mg/dL — AB (ref 0.44–1.00)
Calcium: 9.5 mg/dL (ref 8.9–10.3)
Chloride: 96 mmol/L — ABNORMAL LOW (ref 101–111)
GFR calc non Af Amer: 30 mL/min — ABNORMAL LOW (ref >60)
GFR, EST AFRICAN AMERICAN: 35 mL/min — AB (ref >60)
Glucose, Bld: 100 mg/dL — ABNORMAL HIGH (ref 70–99)
POTASSIUM: 3.8 mmol/L (ref 3.5–5.1)
Sodium: 138 mmol/L (ref 135–145)

## 2014-10-01 ENCOUNTER — Ambulatory Visit (INDEPENDENT_AMBULATORY_CARE_PROVIDER_SITE_OTHER): Admitting: Cardiovascular Disease

## 2014-10-01 ENCOUNTER — Encounter: Payer: Self-pay | Admitting: Cardiovascular Disease

## 2014-10-01 VITALS — BP 133/70 | HR 77 | Ht 68.0 in | Wt 118.0 lb

## 2014-10-01 DIAGNOSIS — I10 Essential (primary) hypertension: Secondary | ICD-10-CM | POA: Diagnosis not present

## 2014-10-01 DIAGNOSIS — I701 Atherosclerosis of renal artery: Secondary | ICD-10-CM | POA: Diagnosis not present

## 2014-10-01 NOTE — Progress Notes (Signed)
PCP: Dr. Melford Aase Primary Cardiologist: Dr. Aundra Dubin  HPI:  81 yo who is here today for follow-up visit regarding renal artery stenosis. She has known history of HTN, TIAs, and chronic diastolic CHF.    In 6/15, she was admitted with upper GI bleed from erosive esophagitis and had AKI, diuretics were held.  In 7/15, she was  admitted with acute on chronic diastolic CHF and Lasix was restarted.   Echo in 7/15 showed normal EF and mild AS.   Renal artery duplex  showed severe right renal artery stenosis and borderline significant left renal artery stenosis. There was significant stenosis in the distal aorta and significant disease affecting the SMA and celiac trunk. She had no claudication. She was hospitalized recently with acute on chronic diastolic heart failure. Renal function worsened with diuresis. The patient was discharged under hospice care but her condition has actually been improving since then. Renal function is almost back to her baseline and blood pressure has been controlled on current medications.  Allergies  Allergen Reactions  . Aricept [Donepezil Hcl] Nausea And Vomiting    Nausea and vomiting  . Prevacid [Lansoprazole] Nausea And Vomiting    GI upset  . Tylenol [Acetaminophen] Nausea Only    Tylenol #3 - Nausea     Current Outpatient Prescriptions on File Prior to Visit  Medication Sig Dispense Refill  . albuterol (PROVENTIL) (2.5 MG/3ML) 0.083% nebulizer solution Inhale 3 mLs (2.5 mg total) into the lungs every 6 (six) hours as needed for wheezing or shortness of breath. 75 mL 12  . amLODipine (NORVASC) 5 MG tablet Take 1 tablet (5 mg total) by mouth daily. 30 tablet 6  . aspirin EC 81 MG tablet Take 1 tablet (81 mg total) by mouth daily. 90 tablet 3  . atorvastatin (LIPITOR) 20 MG tablet Take 1 tablet (20 mg total) by mouth daily. 30 tablet 6  . cloNIDine (CATAPRES-TTS-1) 0.1 mg/24hr patch Place 1 patch (0.1 mg total) onto the skin once a week. 2 patch 2  .  furosemide (LASIX) 40 MG tablet Take 2 tablets (80 mg total) by mouth 2 (two) times daily. 120 tablet 11  . polyvinyl alcohol (LIQUIFILM TEARS) 1.4 % ophthalmic solution Place 1 drop into both eyes 3 (three) times daily. 15 mL 0  . potassium chloride SA (K-DUR,KLOR-CON) 20 MEQ tablet Take 1 tablet (20 mEq total) by mouth daily. Take 1 extra tab when you take metolazone 5 tablet 1  . senna-docusate (SENOKOT-S) 8.6-50 MG per tablet Take 2 tablets by mouth 2 (two) times daily.    Marland Kitchen spironolactone (ALDACTONE) 25 MG tablet Take 0.5 tablets (12.5 mg total) by mouth daily. 15 tablet 3   No current facility-administered medications on file prior to visit.     Past Medical History  Diagnosis Date  . Vitamin D deficiency   . Diverticulosis   . Coronary artery calcification seen on CAT scan   . TIA (transient ischemic attack)     Carotid dopplers (8/11): 40-50% RICA stenosis. Carotid dopplers (12/13) with 40-59% bilateral ICA stenoses.   . CKD (chronic kidney disease)   . Inguinal hernia   . Broken hip     bilateral  . Hypertension   . Hyperlipidemia   . Hypothyroidism   . Senile osteoporosis   . Osteopenia   . Elevated hemoglobin A1c   . Bradycardia     Holter monitor (8/11) with no significant bradycardia, frequent PACs, few short runs of slow atrial tachycardia.   . Chronic  diastolic heart failure     a. Echo (6/15):  EF 60-65%, Gr 2 DD, mild AS (mean 8 mmHg);  b.  Echo (11/2013):  mild LVH, EF 55-60%, no RWMA, Gr 1 DD, mild AS (peak 19 mmHg), mild MR, trivial eff   . Aortic stenosis     a. mild (echo 6/15 with mean gradient 8 mmHg)  . Carotid stenosis     a. Carotid US (04/2012):  bilateral ICA 40-59%;  b.  Carotid US (8/15): Bilateral ICA 40-59% - follow up 1 year   . Anxiety   . Gastritis 10/2013    EGD showed erosive gastritis and non-obstructing Schatzki's ring.      Past Surgical History  Procedure Laterality Date  . Inguinal hernia repair    . Tonsillectomy    . Hip  arthroplasty  04/01/2012    Procedure: ARTHROPLASTY BIPOLAR HIP;  Surgeon: Kerin Salen, MD;  Location: [facility];  Service: Orthopedics;  Laterality: Left;  . Compression hip screw  05/06/2012    Procedure: COMPRESSION HIP;  Surgeon: Jolyn Nap, MD;  Location: [facility] [place];  Service: Orthopedics;  Laterality: Right;  . Skin lesion excision      benignt; from shoulder  . Hip surgery      left  . Eye surgery      bilateral  . Eye surgery      tear duct R side  . Blepharoplasty  1983  . Melanoma excision  1991    Right Arm  . Total hip arthroplasty Right 2013  . Total hip arthroplasty Left 2013  . Esophagogastroduodenoscopy N/A 11/12/2013    Procedure: ESOPHAGOGASTRODUODENOSCOPY (EGD);  Surgeon: Jerene Bears, MD;  Location: [facility];  Service: Endoscopy;  Laterality: N/A;  . Esophagogastroduodenoscopy N/A 07/26/2014    Procedure: ESOPHAGOGASTRODUODENOSCOPY (EGD);  Surgeon: Inda Castle, MD;  Location: [place];  Service: Endoscopy;  Laterality: N/A;  . Savory dilation N/A 07/26/2014    Procedure: SAVORY DILATION;  Surgeon: Inda Castle, MD;  Location: [facility];  Service: Endoscopy;  Laterality: N/A;     Family History  Problem Relation Age of Onset  . CVA Mother     died age 75  . Emphysema Sister   . Colon cancer Neg Hx   . Other      patient does not know     History   Social History  . Marital Status: Widowed    Spouse Name: N/A  . Number of Children: 0  . Years of Education: N/A   Occupational History  . Not on file.   Social History Main Topics  . Smoking status: Former Smoker    Types: Cigarettes    Quit date: 05/06/1971  . Smokeless tobacco: Never Used  . Alcohol Use: No     Comment: none since Nov 2013-was on 2 beers/night  . Drug Use: No  . Sexual Activity: Not Currently   Other Topics Concern  . Not on file   Social History Narrative     ROS A 10 point review of system was performed. It is negative other than that mentioned in the  history of present illness.   PHYSICAL EXAM   BP 133/70 mmHg  Pulse 77  Ht 5\' 8"  (1.727 m)  Wt 118 lb (53.524 kg)  BMI 17.95 kg/m2 Constitutional: She is oriented to person, place, and time. She appears frail. No distress.  HENT: No nasal discharge.  Head: Normocephalic and atraumatic.  Eyes: Pupils are equal and round.  No discharge.  Neck: Normal range of motion. Neck supple. No JVD present. No thyromegaly present.  Cardiovascular: Normal rate, regular rhythm, normal heart sounds. Exam reveals no gallop and no friction rub. No murmur heard.  Pulmonary/Chest: Effort normal and breath sounds normal. No stridor. No respiratory distress. She has no wheezes. She has no rales. She exhibits no tenderness.  Abdominal: Soft. Bowel sounds are normal. She exhibits no distension. There is no tenderness. There is no rebound and no guarding.  Musculoskeletal: Normal range of motion. She exhibits no edema and no tenderness.  Neurological: She is alert and oriented to person, place, and time. Coordination normal.  Skin: Skin is warm and dry. No rash noted. She is not diaphoretic. No erythema. No pallor.  Psychiatric: She has a normal mood and affect. Her behavior is normal. Judgment and thought content normal.  Vascular: Femoral pulses are very strong.      ASSESSMENT AND PLAN

## 2014-10-01 NOTE — Assessment & Plan Note (Signed)
Blood pressure is now well controlled on current medications. 

## 2014-10-01 NOTE — Patient Instructions (Signed)
Medication Instructions:  No chances today.  Labwork: None today  Testing/Procedures: None today  Follow-Up: As needed with Dr Fletcher Anon.

## 2014-10-01 NOTE — Assessment & Plan Note (Signed)
The patient's kidney function has stabilized and blood pressure is currently reasonably controlled. Thus, there is no indication for renal artery revascularization. Even if there is clinical indication, given her age and comorbidities, it might be best just to treat her medically. She can follow-up with me as needed.

## 2014-10-04 ENCOUNTER — Telehealth (HOSPITAL_COMMUNITY): Payer: Self-pay | Admitting: Vascular Surgery

## 2014-10-04 NOTE — Telephone Encounter (Signed)
Nurse called she would like a ASAP call back.. They want to know if Hospice doctor can do symptom management .Marland Kitchen PRN diuretics

## 2014-10-04 NOTE — Telephone Encounter (Signed)
Spoke w/Maura, she states she spoke w/Amy Ninfa Meeker, NP had got order for metolazone today

## 2014-10-08 ENCOUNTER — Encounter (INDEPENDENT_AMBULATORY_CARE_PROVIDER_SITE_OTHER): Payer: Medicare Other | Admitting: Ophthalmology

## 2014-10-08 DIAGNOSIS — H35033 Hypertensive retinopathy, bilateral: Secondary | ICD-10-CM | POA: Diagnosis not present

## 2014-10-08 DIAGNOSIS — H3532 Exudative age-related macular degeneration: Secondary | ICD-10-CM | POA: Diagnosis not present

## 2014-10-08 DIAGNOSIS — H43813 Vitreous degeneration, bilateral: Secondary | ICD-10-CM

## 2014-10-08 DIAGNOSIS — H3531 Nonexudative age-related macular degeneration: Secondary | ICD-10-CM | POA: Diagnosis not present

## 2014-10-08 DIAGNOSIS — I10 Essential (primary) hypertension: Secondary | ICD-10-CM

## 2014-10-15 ENCOUNTER — Telehealth (HOSPITAL_COMMUNITY): Payer: Self-pay | Admitting: Cardiology

## 2014-10-15 NOTE — Telephone Encounter (Signed)
Mora,Rn aware Pt is already scheduled for ENT in the near future No new orders given at this time

## 2014-10-15 NOTE — Telephone Encounter (Signed)
Usually do not see deafness at her dose of lasix.  Think she needs to continue, would have her see an audiologist or ENT for evaluation.

## 2014-10-15 NOTE — Telephone Encounter (Signed)
Leandro Reasoner, RN with Hospice called with report after patients visit. 1.hearing is much worse Pt can no longer hear the dishwasher, microwave, or washing machine while they are running Pt states she can hear a radio in her ears Hospice nurse concerned as this could be a SE of the Lasix  2. Increase in confusion  Pt states " i feel like im loosing my mind" Safety is a big concern for hospice nurse Pt did have another dizzy spell and fell back onto the bed Often when verbal orders are given to patient, medication adjustments, they are not completed  Family is working to get patient into a higher level care facility  Please advise if necessary

## 2014-10-29 ENCOUNTER — Telehealth (HOSPITAL_COMMUNITY): Payer: Self-pay | Admitting: Vascular Surgery

## 2014-10-29 ENCOUNTER — Ambulatory Visit (HOSPITAL_COMMUNITY)
Admission: RE | Admit: 2014-10-29 | Discharge: 2014-10-29 | Disposition: A | Discharge status: Home / Self Care | Source: Ambulatory Visit | Attending: Internal Medicine | Admitting: Internal Medicine

## 2014-10-29 ENCOUNTER — Encounter (HOSPITAL_COMMUNITY): Payer: Self-pay

## 2014-10-29 VITALS — BP 128/70 | HR 72 | Wt 118.5 lb

## 2014-10-29 DIAGNOSIS — I5020 Unspecified systolic (congestive) heart failure: Secondary | ICD-10-CM | POA: Insufficient documentation

## 2014-10-29 DIAGNOSIS — I5032 Chronic diastolic (congestive) heart failure: Secondary | ICD-10-CM | POA: Insufficient documentation

## 2014-10-29 DIAGNOSIS — E785 Hyperlipidemia, unspecified: Secondary | ICD-10-CM | POA: Diagnosis not present

## 2014-10-29 DIAGNOSIS — I6523 Occlusion and stenosis of bilateral carotid arteries: Secondary | ICD-10-CM | POA: Insufficient documentation

## 2014-10-29 DIAGNOSIS — Z87891 Personal history of nicotine dependence: Secondary | ICD-10-CM | POA: Insufficient documentation

## 2014-10-29 DIAGNOSIS — I10 Essential (primary) hypertension: Secondary | ICD-10-CM | POA: Diagnosis not present

## 2014-10-29 DIAGNOSIS — I6529 Occlusion and stenosis of unspecified carotid artery: Secondary | ICD-10-CM

## 2014-10-29 NOTE — Progress Notes (Signed)
Advanced Heart Failure Medication Review by a Pharmacist  Does the patient  feel that his/her medications are working for him/her?  yes  Has the patient been experiencing any side effects to the medications prescribed?  no  Does the patient measure his/her own blood pressure or blood glucose at home?  no   Does the patient have any problems obtaining medications due to transportation or finances?   no  Understanding of regimen: fair Understanding of indications: fair Potential of compliance: good    Pharmacist comments: Patient is a pleasant 81 yo female who presents to heart failure clinic and medications were reviewed with a pharmacist. She states that she has a hospice nurse who helps her with her medicines, but is able to verify her medications when prompted. She has no concerns at this time and no medication discrepancies were noted.   Megan E. Supple, Pharm.D Clinical Pharmacy Resident Pager: [phone] 10/29/2014 11:14 AM

## 2014-10-29 NOTE — Patient Instructions (Signed)
Will set up lab work and carotid ultrasounds at [name]. [place] [facility] [place]. [address], [place], [place] [address] Phone: [phone]  Follow up 4 months.  Do the following things EVERYDAY: 1) Weigh yourself in the morning before breakfast. Write it down and keep it in a log. 2) Take your medicines as prescribed 3) Eat low salt foods-Limit salt (sodium) to 2000 mg per day.  4) Stay as active as you can everyday 5) Limit all fluids for the day to less than 2 liters

## 2014-10-29 NOTE — Telephone Encounter (Signed)
Pt had to leave before we got her carotid scheduled. I called left pt a message on voicemail about her carotid appt 11/06/14 @ 11

## 2014-10-29 NOTE — Progress Notes (Signed)
Patient ID: Rachael [name], female   DOB: 21-Jan-1924, 81 y.o.   MRN: [id number] PCP: Dr. Melford Aase  81 yo with history of HTN, TIAs, and chronic diastolic CHF presents for  followup.  She fractured both of her femurs, once in 11/13 and once in 12/13.  She had surgery both times.  She was then admitted in 5/14 with dehydration and acute on chronic renal failure.  During the admission with femur fracture in 12/13, she was told she had "fluid around her lungs."  She was taken off bumetanide when she was admitted with dehydration in 5/14 and put on Lasix later on.  In 6/15, she was admitted with upper GI bleed from erosive esophagitis and had AKI, diuretics were held.  In 7/15, she was then admitted with acute on chronic diastolic CHF and Lasix was restarted.  She has had several syncopal events and was set up for an event monitor by Richardson Dopp.  This showed only mild sinus bradycardia, no significant events.  She was found to have significant right renal artery stenosis by renal artery dopplers.   She was admitted in 2/16 with AKI, hypertension, and acute on chronic diastolic CHF.  Creatinine worsened considerably with attempts at diuresis.  She had severe anxiety.  She ended up going home with hospice care.  However, she has done well since getting home.  Creatinine has been trending down.    She returns for f/u. At last visit started on spiro 12.5 for HTN. Also saw Dr. Fletcher Anon recently for R RAS > 60%. Medical therapy recommended. Overall has been feeling pretty good. Was going to [employer] and doing exercises at home. However today feels run down. She walks with a walker without significant dyspnea - tells me her breathing is "good"..  No orthopnea/PND.  Lower leg edema has resolved. Home hospice following her. Not taking BP at home. SBP here is 128. No dizziness. Weight is stable at 118-119.   ECG: NSR, PACs, septal Qs  Labs (5/14): K 4.3, creatinine 1.5 Labs (7/14): K 4.3, creatinine 1.5, LDL 55, HDL  51 Labs (8/15): K 3.5, creatinine 1.7, BNP 64, HCT 33.2  Labs (9/15): K 3.7, creatinine 1.5 Labs (04/2014): K 4.1 Creatinine 1.77  Labs (07/28/2014): K 3.0 Creatinine 2.14, LDL 137, HDL 47 Labs (4/16): K 3.2, creatinine 1.3 Labs (09/25/14): K 3.8, creatinine 1.5  Allergies (verified):  No Known Drug Allergies   Past Medical History:  1. DIVERTICULOSIS  2. HYPOTHYROIDISM  3. HYPERTENSION: Resistant.  >60% right renal artery stenosis (1/16).  4. TIA: x 2, manifested by diplopia. Carotid dopplers (8/11): 40-50% RICA stenosis.  Carotid dopplers (12/13) with 40-59% bilateral ICA stenoses. Carotids (8/15) with bilateral ICA 40-59% stenoses.  5. Bradycardia: Mild.  Holter monitor (8/11) with no significant bradycardia, frequent PACs, few short runs of slow atrial tachycardia.  6. Chronic diastolic CHF:  Echo (3/41): EF 65%, mild LV hypertrophy, aortic sclerosis without significant stenosis, mild mitral regurgitation.  Echo (12/13) with EF 65-70%, moderate TR, PA systolic pressure 55 mmHg, mild AS with mean gradient 17 mmHg. Echo (7/15) with EF 55-60%, mild LVH, mild AS, mild AI, mild MR.  7. Aortic stenosis: Mild on 7/15 echo.  8. Femur fractures in 11/13 and 12/13, had surgery for repair both times.   9. CKD 10. Upper GI bleed: 6/15, erosive esophagitis.  11. Syncope: Event monitor (8/15) with no significant events.  12. Dementia: Mild.  13. PAD: Renal/abdominal dopplers (1/16) with > 60% right renal artery stenosis, >50%  distal aorta stenosis, > 70% celiac and SMA stenosis.   Family History:  No premature CAD. Mother with CVA in her 84s.   Social History:  Lives alone. Widowed. No children but has a nephew living in town. Nonsmoker.   ROS: All systems reviewed and negative except as per HPI.   Current Outpatient Prescriptions  Medication Sig Dispense Refill  . amLODipine (NORVASC) 5 MG tablet Take 1 tablet (5 mg total) by mouth daily. 30 tablet 6  . aspirin EC 81 MG tablet Take 1 tablet  (81 mg total) by mouth daily. 90 tablet 3  . atorvastatin (LIPITOR) 20 MG tablet Take 1 tablet (20 mg total) by mouth daily. 30 tablet 6  . cloNIDine (CATAPRES-TTS-1) 0.1 mg/24hr patch Place 1 patch (0.1 mg total) onto the skin once a week. 2 patch 2  . furosemide (LASIX) 40 MG tablet Take 2 tablets (80 mg total) by mouth 2 (two) times daily. 120 tablet 11  . polyvinyl alcohol (LIQUIFILM TEARS) 1.4 % ophthalmic solution Place 1 drop into both eyes 3 (three) times daily. 15 mL 0  . potassium chloride SA (K-DUR,KLOR-CON) 20 MEQ tablet Take 1 tablet (20 mEq total) by mouth daily. Take 1 extra tab when you take metolazone (Patient taking differently: Take 20 mEq by mouth daily. ) 5 tablet 1  . senna-docusate (SENOKOT-S) 8.6-50 MG per tablet Take 2 tablets by mouth 2 (two) times daily.    Marland Kitchen spironolactone (ALDACTONE) 25 MG tablet Take 0.5 tablets (12.5 mg total) by mouth daily. 15 tablet 3  . bisacodyl (DULCOLAX) 10 MG suppository Place 10 mg rectally as needed for mild constipation or moderate constipation.     No current facility-administered medications for this encounter.    BP 128/70 mmHg  Pulse 72  Wt 118 lb 8 oz (53.751 kg)  SpO2 98% General: Elderly. Walks with walker. NAD Neck: JVP 8 cm, no thyromegaly or thyroid nodule.  Lungs: Clear to auscultation bilaterally with normal respiratory effort. CV: Nondisplaced PMI.  Heart mildly irregular S1/S2, no S3/S4, 2/6 early systolic murmur RUSB.  Trace ankle edema.  Bilateral carotid bruits.  Normal pedal pulses.  Abdomen: Soft, nontender, no hepatosplenomegaly, no distention.  Neurologic: Alert and oriented x 3.  Psych: Normal affect. Extremities: No clubbing or cyanosis.   Assessment/Plan: 1. Chronic diastolic CHF: Looks good. Volume status stable. Peripheral edema has resolved.  No JVD.   - Continue lasix 80mg  twice a day and KCl 20 daily.  BMET today.   - Continue potassium 20 meq daily.   2. HTN: BP improved on spiro. Continue  amlodipine 5 mg daily, clonidine patch. BMET today.  She has right renal artery stenosis (> 60%) by renal artery doppler evaluation 1/16. Marland Kitchen Has been seen by Dr. Fletcher Anon. Medical therapy recommended  3. Carotid stenosis: Repeat carotids in 8/16.    4. Aortic stenosis: Mild, continue to follow over time.  5. Syncope: No recurrence.  Event monitor showed no significant arrhythmias. Echo showed mild AS. 6. Memory Impairment: In the past on aricept . 7. CKD: AKI in hospital, trended down.  BMET today.  8. PAD: Right renal artery stenosis, > 50% distal aorta stenosis, celiac and SMA stenoses, carotid stenosis. Dr. Aundra Dubin had her restart ASA 81 mg daily and atorvastatin 20 mg daily given extensive vascular diseae.  9. Hyperlipidemia: LDL 137 in 3/16. Now on atorva will recheck next week .   Glori Bickers MD 10/29/2014

## 2014-11-01 ENCOUNTER — Telehealth (HOSPITAL_COMMUNITY): Payer: Self-pay | Admitting: *Deleted

## 2014-11-01 NOTE — Telephone Encounter (Signed)
Maura nurse with hospice called left a message for Megan(she said its was ok if her call was not returned until Monday) . She wants to discuss a plan of care for pt. Requesting to discontinue non essential visits,labs, and medications.  Requests to discontinue Lipitor because she is "not too concerned about cholesterol seeing as she is a hospice patient". She stated it is hard for the patient to get out and its becoming a bit too much to handle all of these appts.  She requests a call back Monday.

## 2014-11-01 NOTE — Telephone Encounter (Signed)
Rachael [name] nurse with hospice called left a message for Rachael [name](she said its was ok if her call was not returned until Monday) . She wants to discuss a plan of care for pt. Requesting to discontinue non essential visits,labs, and medications.  Requests to discontinue Lipitor because she is "not too concerned about cholesterol seeing as she is a hospice patient". She stated it is hard for the patient to get out and its becoming a bit too much to handle all of these appts.  She requests a call back Monday.

## 2014-11-04 NOTE — Telephone Encounter (Signed)
She's not on a lot of meds at this point and is stable, would leave as is.

## 2014-11-04 NOTE — Telephone Encounter (Signed)
Discontinuation of visits, labs, and meds are something a provider must address. Will forward to MD for review.

## 2014-11-05 ENCOUNTER — Encounter (INDEPENDENT_AMBULATORY_CARE_PROVIDER_SITE_OTHER): Payer: Medicare Other | Admitting: Ophthalmology

## 2014-11-05 DIAGNOSIS — H3532 Exudative age-related macular degeneration: Secondary | ICD-10-CM

## 2014-11-05 DIAGNOSIS — H43813 Vitreous degeneration, bilateral: Secondary | ICD-10-CM | POA: Diagnosis not present

## 2014-11-05 DIAGNOSIS — H35033 Hypertensive retinopathy, bilateral: Secondary | ICD-10-CM | POA: Diagnosis not present

## 2014-11-05 DIAGNOSIS — I10 Essential (primary) hypertension: Secondary | ICD-10-CM

## 2014-11-05 DIAGNOSIS — H3531 Nonexudative age-related macular degeneration: Secondary | ICD-10-CM

## 2014-11-06 ENCOUNTER — Encounter (HOSPITAL_COMMUNITY)

## 2014-11-13 ENCOUNTER — Ambulatory Visit (HOSPITAL_COMMUNITY)

## 2014-11-20 ENCOUNTER — Telehealth: Payer: Self-pay | Admitting: Cardiovascular Disease

## 2014-11-20 NOTE — Telephone Encounter (Signed)
Hospice called and Cancelled appt for pt.  Pt has Cancelled the last 3 appt that has been scheduled

## 2014-11-22 ENCOUNTER — Encounter (HOSPITAL_COMMUNITY)

## 2014-12-03 ENCOUNTER — Encounter (INDEPENDENT_AMBULATORY_CARE_PROVIDER_SITE_OTHER): Payer: Medicare Other | Admitting: Ophthalmology

## 2014-12-03 DIAGNOSIS — H3532 Exudative age-related macular degeneration: Secondary | ICD-10-CM

## 2014-12-03 DIAGNOSIS — H43813 Vitreous degeneration, bilateral: Secondary | ICD-10-CM

## 2014-12-03 DIAGNOSIS — H35033 Hypertensive retinopathy, bilateral: Secondary | ICD-10-CM

## 2014-12-03 DIAGNOSIS — H3531 Nonexudative age-related macular degeneration: Secondary | ICD-10-CM

## 2014-12-03 DIAGNOSIS — I10 Essential (primary) hypertension: Secondary | ICD-10-CM | POA: Diagnosis not present

## 2014-12-13 NOTE — Patient Outreach (Signed)
[facility]) Care Management  12/13/2014  Rachael [name] September 08, 1923 [id number]   Referral from High Risk List, Deloria Lair, NP to outreach.  Ronnell Freshwater. [place], [place] Management [place] Assistant Phone: [phone] Fax: [phone]

## 2014-12-19 ENCOUNTER — Telehealth (HOSPITAL_COMMUNITY): Payer: Self-pay | Admitting: Vascular Surgery

## 2014-12-19 NOTE — Telephone Encounter (Signed)
[facility] RN called to report patient  Has had 5 lb weight gain since Tuesday, belly very taught and distended and uncomfortable for patient.  She has had regular BMs daily.  Scant edema in BLEE but seems to be at baseline for patient.  Advised per Darrick Grinder NP-C to have patient take 1 2.5 mg metolazone tablet with an extra 20 meq potassium to see if this helps.  Will update [place] as needed.  Renee Pain

## 2014-12-23 ENCOUNTER — Other Ambulatory Visit: Payer: Self-pay | Admitting: *Deleted

## 2014-12-23 NOTE — Patient Outreach (Signed)
Initial [facility] Care Management Screening call (pt was on high risk cost list).  Pt spoke to me today and informed me that Dr. Aundra Dubin has referred her to Hospice back in March after her hospitalization. At that time, she reports he did not expect her to live 2 weeks. She went to the [facility] for one day and then went home on Hospice service. She sees the Hospice nurse weekly and an aid several times a week. She reports she has not been to see Dr. Melford Aase since then.  She reports she has good days and bad days. She does usually weigh daily but her scale is not working currently. Her aid put in a new battery but it is still not working. The aid will be there tomorrow and she plans on asking her to see if she can get it to work.   She said she celebrated a birthday last week surrounded by 300 of her friends and although it was wonderful, it really wore her out and she feels like she is still recovering from the event.  She would like to hire someone to stay with her a few hours a day, but the agencies are so expensive. We discussed private pay sitters and aids and I suggested this would be more economical. I told her I would ask my nursing colleagues If anyone knows someone who provides care in her area. I advised her if I get a response I will let her know and give her the name. I advised her that she and her family would have to screen the individual as we do not have them on staff. She said she understood and she told me she would be very particular.  I told her I would advise Dr. Melford Aase that she is now on Hospice services.  Deloria Lair [facility] [place] [phone]

## 2014-12-24 ENCOUNTER — Other Ambulatory Visit: Payer: Self-pay | Admitting: Cardiology

## 2014-12-25 ENCOUNTER — Encounter: Payer: Self-pay | Admitting: *Deleted

## 2014-12-30 NOTE — Patient Outreach (Signed)
[facility]) Care Management  12/30/2014  LILLIANNE EICK [birth year] [id number]   Notification from Deloria Lair, NP to close case due to patient is enrolled in Hospice care.  Ronnell Freshwater. [place], [place] Management [place] Assistant Phone: [phone] Fax: [phone]

## 2015-01-01 ENCOUNTER — Encounter: Payer: Self-pay | Admitting: Cardiology

## 2015-01-01 ENCOUNTER — Telehealth: Payer: Self-pay | Admitting: Nurse Practitioner

## 2015-01-01 NOTE — Telephone Encounter (Signed)
Please have order done for her to get palliative care services.

## 2015-01-01 NOTE — Telephone Encounter (Signed)
Received call from operator for Dr. Acie Fredrickson who is [place] today from Dr. Lyman Speller with Hospice and [place].  He states patient is in hospice care as ordered by Dr. Aundra Dubin; states patient is too stable to continue under full-time hospice care and he will be discharging her from their service.  He states she walked 300 feet up an incline without oxygen.  He advised that patient could receive palliative care visits since she lives alone.  If Dr. Aundra Dubin wants this service, he will need to write an order and send to their office.  I advised that I would send message to Dr. Aundra Dubin who is out of the office this week.

## 2015-01-02 NOTE — Telephone Encounter (Signed)
Left message on Dr. Wilhemina Cash voice mail of Dr. Claris Gladden order to get palliative care services for patient.  Advised him to call me back at the office.

## 2015-01-03 ENCOUNTER — Telehealth (HOSPITAL_COMMUNITY): Payer: Self-pay | Admitting: *Deleted

## 2015-01-03 DIAGNOSIS — R531 Weakness: Secondary | ICD-10-CM

## 2015-01-03 DIAGNOSIS — I5022 Chronic systolic (congestive) heart failure: Secondary | ICD-10-CM

## 2015-01-03 NOTE — Telephone Encounter (Signed)
Received call from Hospice, they will be discharging pt next week and state pt currently has a bedside commode and a transport wheelchair which have been provided by [facility], however they will need to be approved by Medicare now for pt to keep them as Hospice will no longer pay for them.  Orders placed for equipment to [facility]

## 2015-01-06 ENCOUNTER — Telehealth (HOSPITAL_COMMUNITY): Payer: Self-pay | Admitting: *Deleted

## 2015-01-06 NOTE — Telephone Encounter (Signed)
This is a Heart Failure Clinic patient, will forward to Heart Failure.

## 2015-01-06 NOTE — Telephone Encounter (Signed)
Debbie with hospice is requesting a call from a nurse to go over discharge from hospice.  Call back number [phone]

## 2015-01-07 ENCOUNTER — Encounter (INDEPENDENT_AMBULATORY_CARE_PROVIDER_SITE_OTHER): Admitting: Ophthalmology

## 2015-01-07 DIAGNOSIS — H3531 Nonexudative age-related macular degeneration: Secondary | ICD-10-CM | POA: Diagnosis not present

## 2015-01-07 DIAGNOSIS — I10 Essential (primary) hypertension: Secondary | ICD-10-CM

## 2015-01-07 DIAGNOSIS — H43813 Vitreous degeneration, bilateral: Secondary | ICD-10-CM

## 2015-01-07 DIAGNOSIS — H3532 Exudative age-related macular degeneration: Secondary | ICD-10-CM | POA: Diagnosis not present

## 2015-01-07 DIAGNOSIS — H35033 Hypertensive retinopathy, bilateral: Secondary | ICD-10-CM

## 2015-01-09 ENCOUNTER — Telehealth (HOSPITAL_COMMUNITY): Payer: Self-pay | Admitting: *Deleted

## 2015-01-09 ENCOUNTER — Telehealth: Payer: Self-pay | Admitting: Licensed Clinical Social Worker

## 2015-01-09 NOTE — Telephone Encounter (Signed)
NP Josh called to let us know that pt would be transitioning out of Hospice soon and think that [place] would be appropriate for her. Is this ok to order?

## 2015-01-09 NOTE — Telephone Encounter (Signed)
CSW met with paramedicine to discuss patient and needs. CSW contacted patient to offer support and assistance although message left for return call. Rachael [name], [place]

## 2015-01-09 NOTE — Telephone Encounter (Signed)
Yes - that would be fine

## 2015-01-21 NOTE — Telephone Encounter (Signed)
Spoke with Evette of [facility], and they think that she needs to be placed back in Hospice care.  Told them that was fine. Spoke with Dr Aundra Dubin and he is in agreement.

## 2015-01-23 ENCOUNTER — Telehealth (HOSPITAL_COMMUNITY): Payer: Self-pay | Admitting: Cardiology

## 2015-01-23 NOTE — Telephone Encounter (Signed)
Rachael [name] with Hospice called to report patient seems dry (severe turgor in skin) Patient currently taking 40 mg BID although we have her documented at 80 BID B/p 108/68 in the past 150''s / 70's Pulse ~58  Per verbally order Oda Kilts, PA and Dr. Aundra Dubin Patient should discontinue Amlodipine and ok for patient to continue 40 BID today however patient should restart 80 mg BID ASAP  Rachael [name] states she is not scheduled to return to patients home until 01/28/15 and patient is unable to make medication changes on her own as she is very confused.

## 2015-01-28 ENCOUNTER — Other Ambulatory Visit (HOSPITAL_COMMUNITY): Payer: Self-pay | Admitting: Cardiology

## 2015-01-29 ENCOUNTER — Telehealth (HOSPITAL_COMMUNITY): Payer: Self-pay | Admitting: *Deleted

## 2015-01-29 NOTE — Telephone Encounter (Signed)
Those changes are ok

## 2015-01-29 NOTE — Telephone Encounter (Signed)
Cristela Blue, RN called concerned about pt.  She states she had called in last week regarding pt's Lasix dose and was told pt was to be on 80 mg Twice daily, although pt had only been on 40 mg Twice daily since she had been caring for pt back in March, even though we had 80 mg bid documented in chart.  She states pt was very upset about taking the 80 mg and has been unable to do so due to incontinence and pt states it is just to much for her.  Advised pt can just go back to her 40 mg Twice daily, Cristela Blue will arrange her pill box accordingly.  Maura, RN also states she was advised to have pt stop Amlodipine last week however she feels pt needs this med due to her BP, yesterday BP was 150/70 and she usually runs 150s/70s last weeks reading of 108 was very unusual for pt.   Cristela Blue states she has placed Amlodipine back into pt's pill box.  Will send to Dr Aundra Dubin for his review, pt is schedule to see him next week for f/u appt.

## 2015-02-04 ENCOUNTER — Other Ambulatory Visit (HOSPITAL_COMMUNITY): Payer: Self-pay | Admitting: *Deleted

## 2015-02-04 DIAGNOSIS — I10 Essential (primary) hypertension: Secondary | ICD-10-CM

## 2015-02-04 MED ORDER — AMLODIPINE BESYLATE 5 MG PO TABS: 5.0000 mg | ORAL_TABLET | Freq: Every day | ORAL | Status: AC

## 2015-02-06 ENCOUNTER — Ambulatory Visit (HOSPITAL_COMMUNITY)
Admission: RE | Admit: 2015-02-06 | Discharge: 2015-02-06 | Disposition: A | Discharge status: Home / Self Care | Source: Ambulatory Visit | Attending: Cardiology | Admitting: Cardiology

## 2015-02-06 VITALS — BP 150/62 | HR 68 | Wt 118.0 lb

## 2015-02-06 DIAGNOSIS — N189 Chronic kidney disease, unspecified: Secondary | ICD-10-CM | POA: Diagnosis not present

## 2015-02-06 DIAGNOSIS — Z79899 Other long term (current) drug therapy: Secondary | ICD-10-CM | POA: Diagnosis not present

## 2015-02-06 DIAGNOSIS — Z8673 Personal history of transient ischemic attack (TIA), and cerebral infarction without residual deficits: Secondary | ICD-10-CM | POA: Insufficient documentation

## 2015-02-06 DIAGNOSIS — I129 Hypertensive chronic kidney disease with stage 1 through stage 4 chronic kidney disease, or unspecified chronic kidney disease: Secondary | ICD-10-CM | POA: Diagnosis not present

## 2015-02-06 DIAGNOSIS — Z823 Family history of stroke: Secondary | ICD-10-CM | POA: Insufficient documentation

## 2015-02-06 DIAGNOSIS — Z7982 Long term (current) use of aspirin: Secondary | ICD-10-CM | POA: Insufficient documentation

## 2015-02-06 DIAGNOSIS — I5032 Chronic diastolic (congestive) heart failure: Secondary | ICD-10-CM | POA: Diagnosis not present

## 2015-02-06 DIAGNOSIS — N183 Chronic kidney disease, stage 3 unspecified: Secondary | ICD-10-CM

## 2015-02-06 DIAGNOSIS — R413 Other amnesia: Secondary | ICD-10-CM | POA: Diagnosis not present

## 2015-02-06 DIAGNOSIS — E785 Hyperlipidemia, unspecified: Secondary | ICD-10-CM | POA: Insufficient documentation

## 2015-02-06 DIAGNOSIS — E039 Hypothyroidism, unspecified: Secondary | ICD-10-CM | POA: Insufficient documentation

## 2015-02-06 DIAGNOSIS — I6529 Occlusion and stenosis of unspecified carotid artery: Secondary | ICD-10-CM | POA: Insufficient documentation

## 2015-02-06 DIAGNOSIS — I350 Nonrheumatic aortic (valve) stenosis: Secondary | ICD-10-CM | POA: Diagnosis not present

## 2015-02-06 DIAGNOSIS — I701 Atherosclerosis of renal artery: Secondary | ICD-10-CM | POA: Diagnosis not present

## 2015-02-06 LAB — BASIC METABOLIC PANEL
Anion gap: 10 (ref 5–15)
BUN: 33 mg/dL — ABNORMAL HIGH (ref 6–20)
CHLORIDE: 96 mmol/L — AB (ref 101–111)
CO2: 34 mmol/L — ABNORMAL HIGH (ref 22–32)
Calcium: 10.1 mg/dL (ref 8.9–10.3)
Creatinine, Ser: 1.88 mg/dL — ABNORMAL HIGH (ref 0.44–1.00)
GFR, EST AFRICAN AMERICAN: 26 mL/min — AB (ref >60)
GFR, EST NON AFRICAN AMERICAN: 23 mL/min — AB (ref >60)
Glucose, Bld: 98 mg/dL (ref 65–99)
Potassium: 3.8 mmol/L (ref 3.5–5.1)
SODIUM: 140 mmol/L (ref 135–145)

## 2015-02-06 NOTE — Patient Instructions (Signed)
Continue current meds  Labs today  We will contact you in 3 months to schedule your next appointment.

## 2015-02-07 NOTE — Progress Notes (Signed)
Patient ID: Rachael [name], female   DOB: 09-26-1923, 81 y.o.   MRN: [id number] PCP: Dr. Melford [name]  81 yo with history of HTN, TIAs, and chronic diastolic CHF presents for  followup.  She fractured both of her femurs, once in 11/13 and once in 12/13.  She had surgery both times.  She was then admitted in 5/14 with dehydration and acute on chronic renal failure.  During the admission with femur fracture in 12/13, she was told she had "fluid around her lungs."  She was taken off bumetanide when she was admitted with dehydration in 5/14 and put on Lasix later on.  In 6/15, she was admitted with upper GI bleed from erosive esophagitis and had AKI, diuretics were held.  In 7/15, she was then admitted with acute on chronic diastolic CHF and Lasix was restarted.  She has had several syncopal events and was set up for an event monitor by Rachael [name].  This showed only mild sinus bradycardia, no significant events.  She was found to have significant right renal artery stenosis by renal artery dopplers.   She was admitted in 2/16 with AKI, hypertension, and acute on chronic diastolic CHF.  Creatinine worsened considerably with attempts at diuresis.  She had severe anxiety.  She ended up going home with hospice care.  However, she has done well since getting home.  Creatinine has been trending down.    She returns for followup.  She has "good days and bad days."  Some anxiety.  She has poor balance but no falls.  Weight is stable.  BP is mildly elevated today.  She has cut back on Lasix to 40 mg bid because she does not have to get up to go to the bathroom as much.  She can walk around in her house without dyspnea.  No orthopnea/PND.  No chest pain.  She can walk out to her mailbox.  SBP 110s-120s when it is checked at home.  Main concern is lack of energy/fatigue.   Labs (5/14): K 4.3, creatinine 1.5 Labs (7/14): K 4.3, creatinine 1.5, LDL 55, HDL 51 Labs (8/15): K 3.5, creatinine 1.7, BNP 64, HCT 33.2  Labs  (9/15): K 3.7, creatinine 1.5 Labs (04/2014): K 4.1 Creatinine 1.77  Labs (07/28/2014): K 3.0 Creatinine 2.14, LDL 137, HDL 47 Labs (4/16): K 3.2, creatinine 1.3 Labs (09/25/14): K 3.8, creatinine 1.5 Labs (8/16): K 3.6, creatinine 1.74, HCT 35.5  Allergies (verified):  No Known Drug Allergies   Past Medical History:  1. DIVERTICULOSIS  2. HYPOTHYROIDISM  3. HYPERTENSION: Resistant.  >60% right renal artery stenosis (1/16).  4. TIA: x 2, manifested by diplopia. Carotid dopplers (8/11): 40-50% RICA stenosis.  Carotid dopplers (12/13) with 40-59% bilateral ICA stenoses. Carotids (8/15) with bilateral ICA 40-59% stenoses.  5. Bradycardia: Mild.  Holter monitor (8/11) with no significant bradycardia, frequent PACs, few short runs of slow atrial tachycardia.  6. Chronic diastolic CHF:  Echo (7/85): EF 65%, mild LV hypertrophy, aortic sclerosis without significant stenosis, mild mitral regurgitation.  Echo (12/13) with EF 65-70%, moderate TR, PA systolic pressure 55 mmHg, mild AS with mean gradient 17 mmHg. Echo (7/15) with EF 55-60%, mild LVH, mild AS, mild AI, mild MR.  7. Aortic stenosis: Mild on 7/15 echo.  8. Femur fractures in 11/13 and 12/13, had surgery for repair both times.   9. CKD 10. Upper GI bleed: 6/15, erosive esophagitis.  11. Syncope: Event monitor (8/15) with no significant events.  12. Dementia: Mild.  13.  PAD: Renal/abdominal dopplers (1/16) with > 60% right renal artery stenosis, >50% distal aorta stenosis, > 70% celiac and SMA stenosis.   Family History:  No premature CAD. Mother with CVA in her 79s.   Social History:  Lives alone. Widowed. No children but has a nephew living in town. Nonsmoker.   ROS: All systems reviewed and negative except as per HPI.   Current Outpatient Prescriptions  Medication Sig Dispense Refill  . amLODipine (NORVASC) 5 MG tablet Take 1 tablet (5 mg total) by mouth daily. 30 tablet 6  . aspirin EC 81 MG tablet Take 1 tablet (81 mg total) by  mouth daily. 90 tablet 3  . bisacodyl (DULCOLAX) 10 MG suppository Place 10 mg rectally as needed for mild constipation or moderate constipation.    . cloNIDine (CATAPRES-TTS-1) 0.1 mg/24hr patch Place 1 patch (0.1 mg total) onto the skin once a week. 2 patch 2  . furosemide (LASIX) 40 MG tablet Take 40 mg by mouth 2 (two) times daily.    Marland Kitchen LORazepam (ATIVAN) 0.5 MG tablet Take 0.5 mg by mouth every 4 (four) hours as needed for anxiety.    . NON FORMULARY 2 L as needed.    . polyvinyl alcohol (LIQUIFILM TEARS) 1.4 % ophthalmic solution Place 1 drop into both eyes 3 (three) times daily. 15 mL 0  . potassium chloride SA (K-DUR,KLOR-CON) 20 MEQ tablet Take 1 tablet (20 mEq total) by mouth daily. Take 1 extra tab when you take metolazone (Patient taking differently: Take 20 mEq by mouth daily. ) 5 tablet 1  . senna-docusate (SENOKOT-S) 8.6-50 MG per tablet Take 2 tablets by mouth 2 (two) times daily. (Patient taking differently: Take 1 tablet by mouth 2 (two) times daily. )    . spironolactone (ALDACTONE) 25 MG tablet TAKE 0.5 TABLETS (12.5 MG TOTAL) BY MOUTH DAILY. 15 tablet 0   No current facility-administered medications for this encounter.    BP 150/62 mmHg  Pulse 68  Wt 118 lb (53.524 kg)  SpO2 99% General: Elderly. Walks with walker. NAD Neck: JVP 7 cm, no thyromegaly or thyroid nodule.  Lungs: Clear to auscultation bilaterally with normal respiratory effort. CV: Nondisplaced PMI.  Heart mildly irregular S1/S2, no S3/S4, 2/6 early systolic murmur RUSB.  Trace ankle edema.  Bilateral carotid bruits.  Normal pedal pulses.  Abdomen: Soft, nontender, no hepatosplenomegaly, no distention.  Neurologic: Alert and oriented x 3.  Psych: Normal affect. Extremities: No clubbing or cyanosis.   Assessment/Plan: 1. Chronic diastolic CHF: Volume status stable. Peripheral edema has resolved.  No JVD.   - Continue Lasix 40 mg bid with KCl 20 daily.    2. HTN:  She has right renal artery stenosis (>  60%) by renal artery doppler evaluation 1/16. Has been seen by Dr. Fletcher [name]. Medical therapy recommended.  Continue current antihypertensives.  BP has been good when taken by nurse at her house.  3. Carotid stenosis: Would hold off on cardtid dopplers now that she is followed by hospice.    4. Aortic stenosis: Mild by last echo.   5. Syncope: No recurrence.  Event monitor showed no significant arrhythmias. Echo showed mild AS. 6. Memory Impairment: In the past on aricept . 7. CKD: AKI in hospital, trended down.  BMET today.  8. PAD: Right renal artery stenosis, > 50% distal aorta stenosis, celiac and SMA stenoses, carotid stenosis. Continue ASA 81 and atorvastatin.  9. Hyperlipidemia: Will need lipids drawn at next visit.   Loralie Champagne MD 02/07/2015

## 2015-02-14 ENCOUNTER — Encounter (INDEPENDENT_AMBULATORY_CARE_PROVIDER_SITE_OTHER): Payer: Medicare Other | Admitting: Ophthalmology

## 2015-02-14 DIAGNOSIS — H35033 Hypertensive retinopathy, bilateral: Secondary | ICD-10-CM | POA: Diagnosis not present

## 2015-02-14 DIAGNOSIS — I10 Essential (primary) hypertension: Secondary | ICD-10-CM | POA: Diagnosis not present

## 2015-02-14 DIAGNOSIS — H3531 Nonexudative age-related macular degeneration: Secondary | ICD-10-CM

## 2015-02-14 DIAGNOSIS — H3532 Exudative age-related macular degeneration: Secondary | ICD-10-CM

## 2015-02-14 DIAGNOSIS — H43813 Vitreous degeneration, bilateral: Secondary | ICD-10-CM | POA: Diagnosis not present

## 2015-03-05 ENCOUNTER — Telehealth (HOSPITAL_COMMUNITY): Payer: Self-pay

## 2015-03-05 NOTE — Telephone Encounter (Signed)
Mickel Baas from Hospice called  Today they had a team discussion about patient.  She is weakening.  Eating less. Not drinking.  BP has been dropping.  Today it is 106/52.  Hospice MD suggested they ask Dr. Aundra Dubin if he would discontinue her Clonidine patch  Routed to Dr. Aundra Dubin for advice

## 2015-03-05 NOTE — Telephone Encounter (Signed)
She can stop the clonidine patch.

## 2015-03-05 NOTE — Telephone Encounter (Signed)
Relayed message to Rachael [name] from Hospice that Dr. Aundra Dubin said that she stop the clonidine patch

## 2015-03-11 ENCOUNTER — Other Ambulatory Visit: Payer: Self-pay | Admitting: Cardiology

## 2015-03-21 ENCOUNTER — Encounter (INDEPENDENT_AMBULATORY_CARE_PROVIDER_SITE_OTHER): Admitting: Ophthalmology

## 2015-03-21 DIAGNOSIS — H353221 Exudative age-related macular degeneration, left eye, with active choroidal neovascularization: Secondary | ICD-10-CM | POA: Diagnosis not present

## 2015-03-21 DIAGNOSIS — H353114 Nonexudative age-related macular degeneration, right eye, advanced atrophic with subfoveal involvement: Secondary | ICD-10-CM | POA: Diagnosis not present

## 2015-03-21 DIAGNOSIS — H35033 Hypertensive retinopathy, bilateral: Secondary | ICD-10-CM | POA: Diagnosis not present

## 2015-03-21 DIAGNOSIS — H43813 Vitreous degeneration, bilateral: Secondary | ICD-10-CM | POA: Diagnosis not present

## 2015-03-21 DIAGNOSIS — I10 Essential (primary) hypertension: Secondary | ICD-10-CM | POA: Diagnosis not present

## 2015-03-25 ENCOUNTER — Telehealth (HOSPITAL_COMMUNITY): Payer: Self-pay

## 2015-03-25 MED ORDER — FUROSEMIDE 40 MG PO TABS: 40.0000 mg | ORAL_TABLET | Freq: Two times a day (BID) | ORAL | Status: DC

## 2015-03-25 MED ORDER — SPIRONOLACTONE 25 MG PO TABS: ORAL_TABLET | ORAL | Status: AC

## 2015-03-25 NOTE — Telephone Encounter (Signed)
Hospice Nurse Cristela Blue [phone] called  Patient is having increased urinary frequency at night.  No edema at all and appears dry to her.  Would like to know if her Lasix can be decreased from 40 mg bid to daily

## 2015-03-25 NOTE — Telephone Encounter (Signed)
Change Lasix to 40 mg daily but make sure to weigh daily.  If gains 3 lbs in a week, go back to bid.

## 2015-03-25 DEATH — deceased

## 2015-03-26 MED ORDER — FUROSEMIDE 40 MG PO TABS: 40.0000 mg | ORAL_TABLET | Freq: Every day | ORAL | Status: AC

## 2015-03-26 MED ORDER — POTASSIUM CHLORIDE CRYS ER 20 MEQ PO TBCR: 20.0000 meq | EXTENDED_RELEASE_TABLET | ORAL | Status: AC

## 2015-03-26 NOTE — Telephone Encounter (Signed)
Rachael [name] aware and agreeable

## 2015-04-24 ENCOUNTER — Telehealth (HOSPITAL_COMMUNITY): Payer: Self-pay

## 2015-04-24 NOTE — Telephone Encounter (Signed)
That would be fine 

## 2015-04-24 NOTE — Telephone Encounter (Signed)
Mickel Baas from Hospice called and said that they have placed patient on Sorbitol for constipation issues.  Has a lot of gas with bloating and hyperactive bowel sounds; requesting an order for antacid of choice as needed. Also Mickel Baas reports that patient continues to have a slow decline

## 2015-05-01 ENCOUNTER — Telehealth (HOSPITAL_COMMUNITY): Payer: Self-pay | Admitting: *Deleted

## 2015-05-01 NOTE — Telephone Encounter (Signed)
Maura, RN w/Hospice called to let us know that pt is continuing a steady decline and Hospice will reinstate her.  She states pt seems to be dry, no edema at all and they are wanting to cut back as many meds as possible.  She would like to know if it is ok to change Lasix and potassium to just as needed only instead of daily.  Will send to Dr Aundra Dubin for review and call her back Fri or Mon

## 2015-05-01 NOTE — Telephone Encounter (Signed)
That is ok but if weight starts to go up, needs to start daily Lasix again.

## 2015-05-06 NOTE — Telephone Encounter (Signed)
Maura, RN is aware and will adjust pt's pill box accordingly

## 2015-05-22 ENCOUNTER — Telehealth (HOSPITAL_COMMUNITY): Payer: Self-pay | Admitting: Cardiology

## 2015-05-22 NOTE — Telephone Encounter (Signed)
CALLED TO INFORM CHF [facility] WEIGHT IS UP 8 LBS WITH SOME EDEMA  RECENTLY LASIX AND KCL WAS CHANGE TO AS NEEDED  MAURA SPOKE WITH HOSPICE MD AND THE ORDERED LASIX 20 MG AND POTASSIUM 20 FOR 4 DAYS THEN RETURN TO AS NEEDED

## 2015-05-27 ENCOUNTER — Telehealth (HOSPITAL_COMMUNITY): Payer: Self-pay

## 2015-05-27 NOTE — Telephone Encounter (Signed)
Maura [facility] hospice RN called to report patient is actively dying and had GI bleed over the weekend.  Hospice has now moved to strictly comfort care, all meds discontinued unless for comfort measures.  Will forward to providers of CHF clinic to make them aware.  Renee Pain

## 2015-05-28 ENCOUNTER — Telehealth (HOSPITAL_COMMUNITY): Payer: Self-pay | Admitting: *Deleted

## 2015-05-28 NOTE — Telephone Encounter (Signed)
Maura, RN called to let us know pt is actively dying and has not gotten out of bed in 48-72 hours, she request ok to have pt transferred to [facility] for more care, gave ok for transfer

## 2015-06-25 DEATH — deceased

## 2015-12-23 IMAGING — RF DG ESOPHAGUS
8 of 10 series · 14 of 24 positions shown · non-contrast
Comparison: Chest radiographs 07/17/2014.

CLINICAL DATA: Dysphagia with foreign body sensation in throat
after eating French toast this morning. Question food impaction.
Initial encounter.

EXAM:
ESOPHOGRAM/BARIUM SWALLOW
TECHNIQUE: Single contrast examination was performed using  thin barium.
FLUOROSCOPY TIME:  Examination was performed using low dose
fluoroscopy. Images were recorded with fluoro-store. Number of
separate spot images:None. Dose area product: 288.65 uGy*m2.

[Series 1: cp_standard · 0.40mm/px · 2 of 80 frames shown (1 of 8)]
[frame 13/80]
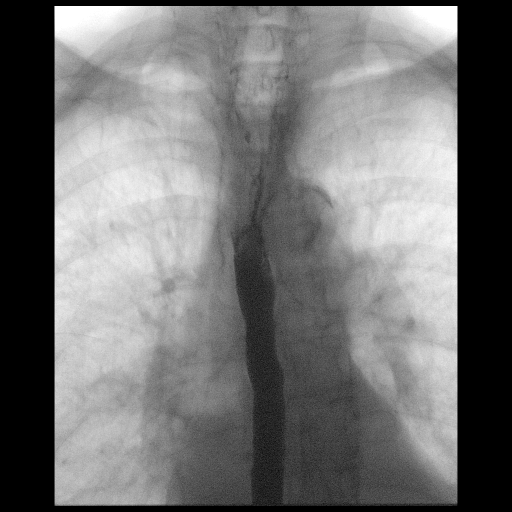
[frame 61/80]
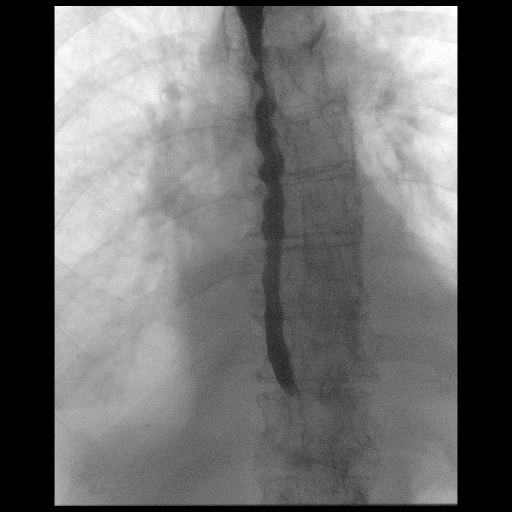

[Series 2: cp_standard · 0.40mm/px · 2 of 58 frames shown (2 of 8)]
[frame 9/58]
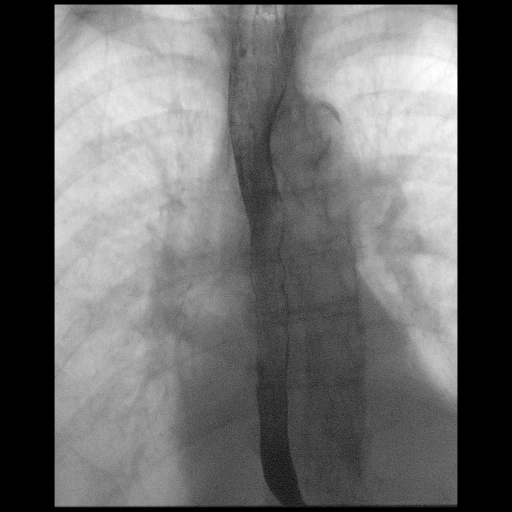
[frame 50/58]
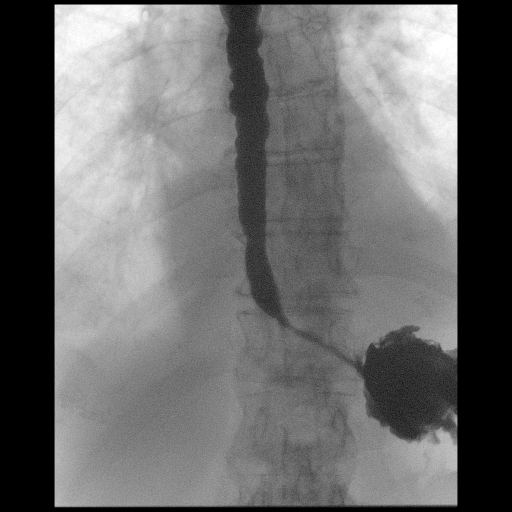

[Series 3: cp_standard · 0.20mm/px · 1 of 1 slices shown (3 of 8)]
[im 1/1]
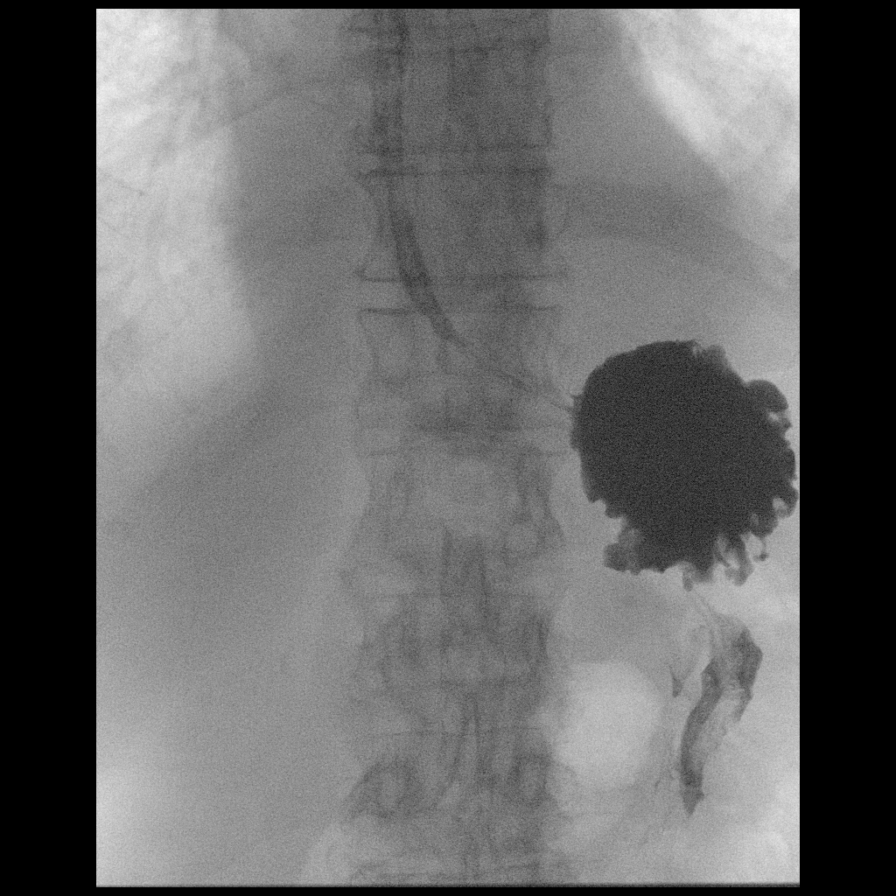

[Series 5: cp_standard · 0.40mm/px · 3 of 54 frames shown (4 of 8)]
[frame 9/54]
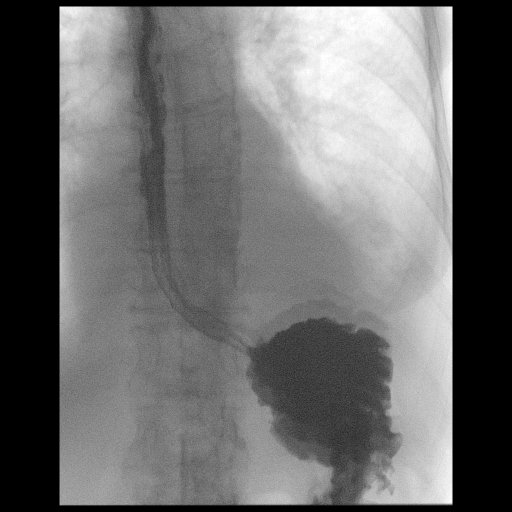
[frame 40/54]
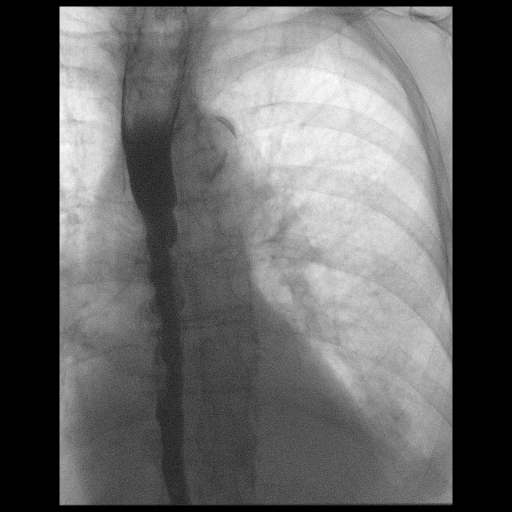
[frame 46/54]
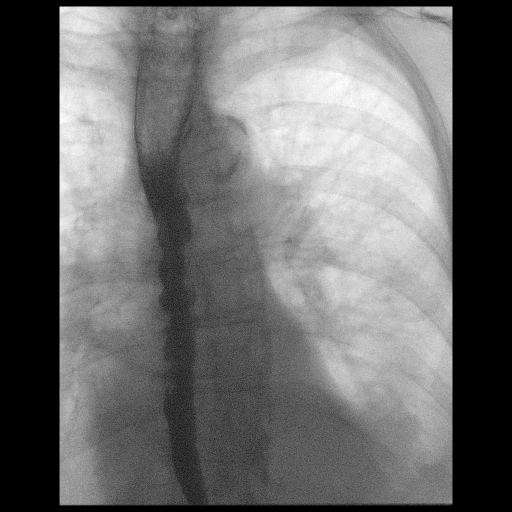

[Series 6: cp_standard · 0.40mm/px · 2 of 41 frames shown (5 of 8)]
[frame 7/41]
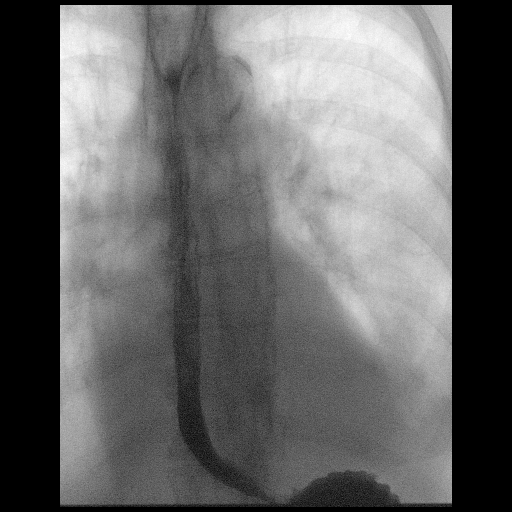
[frame 35/41]
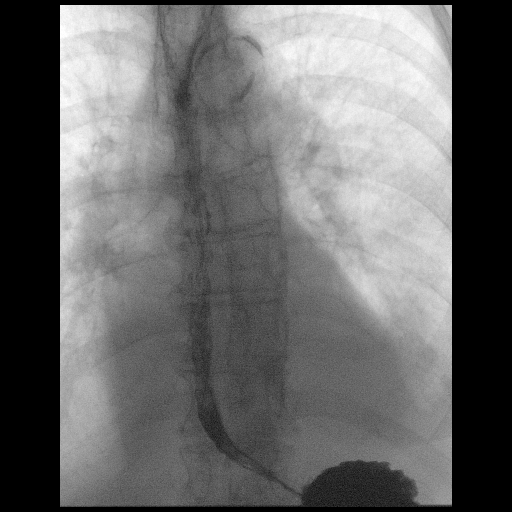

[Series 9: cp_standard · 0.20mm/px · 1 of 1 slices shown (6 of 8)]
[im 1/1]
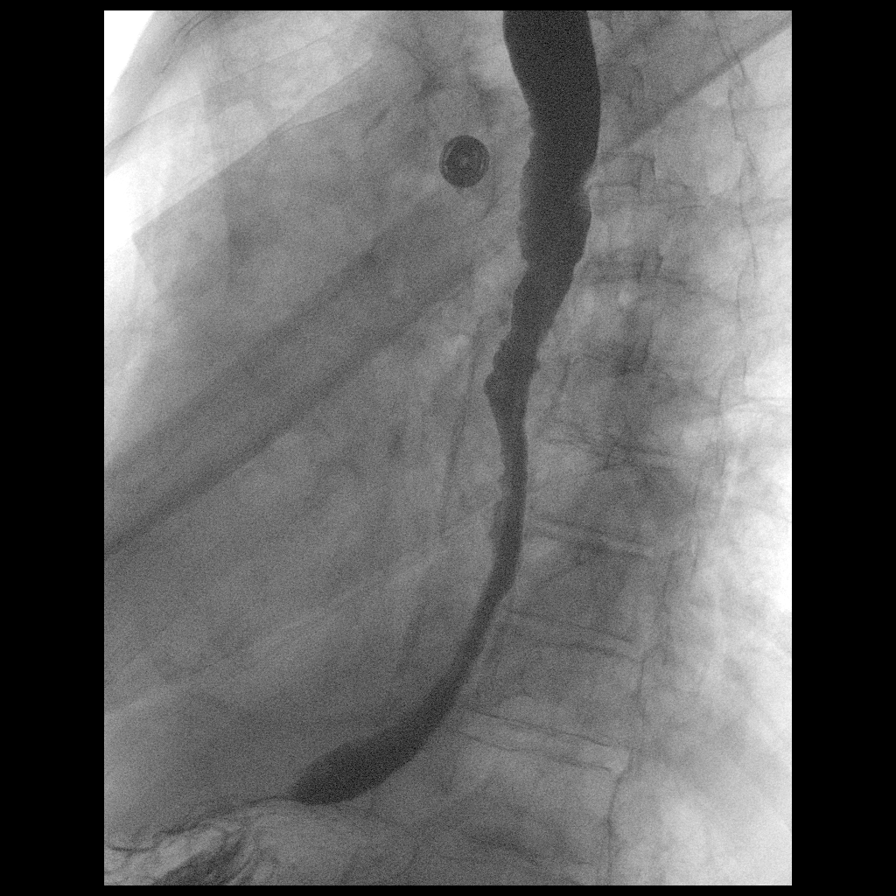

[Series 10: cp_standard · 0.41mm/px · 2 of 122 frames shown (7 of 8)]
[frame 19/122]
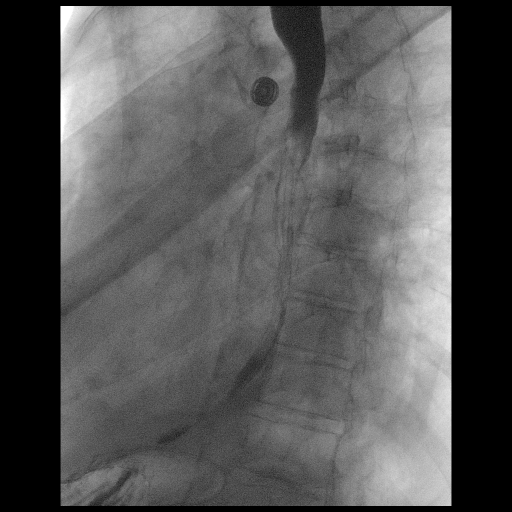
[frame 104/122]
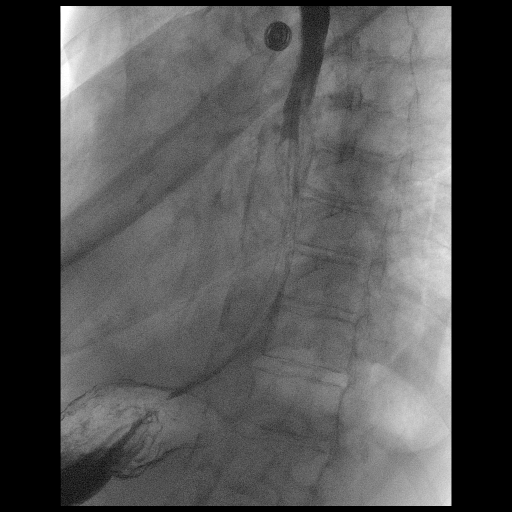

[Series 11: cp_standard · 0.21mm/px · 1 of 1 slices shown (8 of 8)]
[im 1/1]
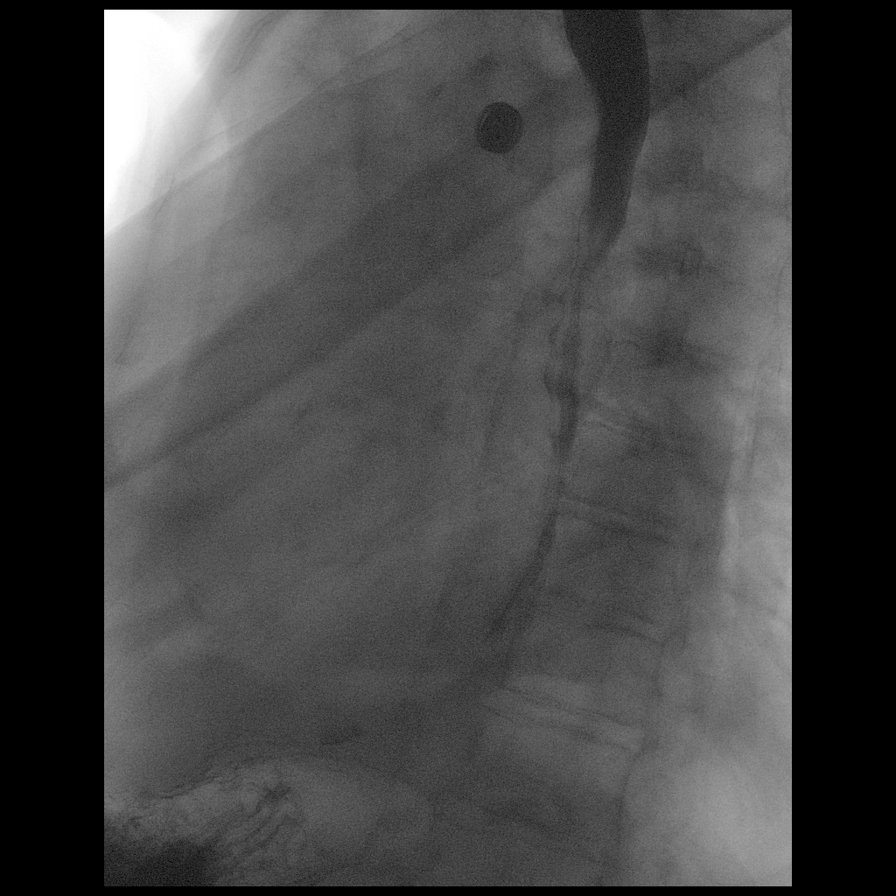

[14 of 24 positions shown; findings below may reference images not displayed]

FINDINGS: Study is limited by the patient's limited mobility. Examination was
performed in the supine semi-erect and right anterior oblique prone
positions.

Initial swallowing in the AP semi-erect position demonstrates
moderate presbyesophagus. There is no evidence of impacted object
within the esophagus. No mucosal lesion or fixed stricture
identified.

Additional imaging was performed in the right anterior oblique prone
position. The esophageal motility is similar. No mucosal lesion,
stricture or impacted foreign body demonstrated. Contrast enters the
stomach. A barium tablet was not administered.
IMPRESSION: 1. No evidence of impacted food within the esophagus.
2. No evidence of mucosal lesion or stricture.
3. Moderate presbyesophagus.
